# Patient Record
Sex: Female | Born: 1945 | Race: White | Hispanic: No | Smoking: Former smoker
Health system: Southern US, Community
[De-identification: ages and names within clinical notes are randomized; demographics above are authoritative.]

## PROBLEM LIST (undated history)

## (undated) DIAGNOSIS — I10 Essential (primary) hypertension: Secondary | ICD-10-CM

## (undated) DIAGNOSIS — F419 Anxiety disorder, unspecified: Secondary | ICD-10-CM

## (undated) DIAGNOSIS — K219 Gastro-esophageal reflux disease without esophagitis: Secondary | ICD-10-CM

## (undated) DIAGNOSIS — Z9981 Dependence on supplemental oxygen: Secondary | ICD-10-CM

## (undated) DIAGNOSIS — J449 Chronic obstructive pulmonary disease, unspecified: Secondary | ICD-10-CM

## (undated) DIAGNOSIS — C801 Malignant (primary) neoplasm, unspecified: Secondary | ICD-10-CM

## (undated) DIAGNOSIS — Z87442 Personal history of urinary calculi: Secondary | ICD-10-CM

## (undated) DIAGNOSIS — J45909 Unspecified asthma, uncomplicated: Secondary | ICD-10-CM

## (undated) HISTORY — PX: ABDOMINAL HYSTERECTOMY: SHX81

## (undated) HISTORY — PX: APPENDECTOMY: SHX54

## (undated) HISTORY — PX: EYE SURGERY: SHX253

## (undated) HISTORY — PX: OTHER SURGICAL HISTORY: SHX169

## (undated) HISTORY — PX: TONSILLECTOMY: SUR1361

---

## 2008-03-24 ENCOUNTER — Encounter: Admission: RE | Admit: 2008-03-24 | Discharge: 2008-03-24 | Payer: Self-pay | Admitting: Geriatric Medicine

## 2008-04-14 ENCOUNTER — Other Ambulatory Visit: Admission: RE | Admit: 2008-04-14 | Discharge: 2008-04-14 | Payer: Self-pay | Admitting: Internal Medicine

## 2008-10-03 ENCOUNTER — Encounter: Admission: RE | Admit: 2008-10-03 | Discharge: 2008-10-03 | Payer: Self-pay | Admitting: Internal Medicine

## 2008-10-14 ENCOUNTER — Encounter: Admission: RE | Admit: 2008-10-14 | Discharge: 2008-10-14 | Payer: Self-pay | Admitting: Internal Medicine

## 2009-11-08 ENCOUNTER — Encounter: Admission: RE | Admit: 2009-11-08 | Discharge: 2009-11-08 | Payer: Self-pay | Admitting: Internal Medicine

## 2010-12-31 ENCOUNTER — Other Ambulatory Visit: Payer: Self-pay | Admitting: Internal Medicine

## 2010-12-31 DIAGNOSIS — D4100 Neoplasm of uncertain behavior of unspecified kidney: Secondary | ICD-10-CM

## 2011-01-02 ENCOUNTER — Other Ambulatory Visit: Payer: Self-pay

## 2011-01-03 ENCOUNTER — Ambulatory Visit
Admission: RE | Admit: 2011-01-03 | Discharge: 2011-01-03 | Disposition: A | Discharge status: Home / Self Care | Payer: BC Managed Care – PPO | Source: Ambulatory Visit | Attending: Internal Medicine | Admitting: Internal Medicine

## 2011-01-03 DIAGNOSIS — D4100 Neoplasm of uncertain behavior of unspecified kidney: Secondary | ICD-10-CM

## 2012-02-18 ENCOUNTER — Other Ambulatory Visit: Payer: Self-pay | Admitting: Internal Medicine

## 2012-02-18 DIAGNOSIS — D4100 Neoplasm of uncertain behavior of unspecified kidney: Secondary | ICD-10-CM

## 2012-02-26 ENCOUNTER — Ambulatory Visit
Admission: RE | Admit: 2012-02-26 | Discharge: 2012-02-26 | Disposition: A | Discharge status: Home / Self Care | Payer: BC Managed Care – PPO | Source: Ambulatory Visit | Attending: Internal Medicine | Admitting: Internal Medicine

## 2012-02-26 DIAGNOSIS — D4100 Neoplasm of uncertain behavior of unspecified kidney: Secondary | ICD-10-CM

## 2013-05-03 DIAGNOSIS — J441 Chronic obstructive pulmonary disease with (acute) exacerbation: Secondary | ICD-10-CM | POA: Diagnosis not present

## 2013-06-02 ENCOUNTER — Other Ambulatory Visit: Payer: Self-pay | Admitting: Internal Medicine

## 2013-06-02 DIAGNOSIS — J309 Allergic rhinitis, unspecified: Secondary | ICD-10-CM | POA: Diagnosis not present

## 2013-06-02 DIAGNOSIS — R1013 Epigastric pain: Secondary | ICD-10-CM | POA: Diagnosis not present

## 2013-06-02 DIAGNOSIS — I10 Essential (primary) hypertension: Secondary | ICD-10-CM | POA: Diagnosis not present

## 2013-06-02 DIAGNOSIS — R319 Hematuria, unspecified: Secondary | ICD-10-CM

## 2013-06-02 DIAGNOSIS — G9332 Myalgic encephalomyelitis/chronic fatigue syndrome: Secondary | ICD-10-CM | POA: Diagnosis not present

## 2013-06-02 DIAGNOSIS — K3189 Other diseases of stomach and duodenum: Secondary | ICD-10-CM | POA: Diagnosis not present

## 2013-06-02 DIAGNOSIS — J441 Chronic obstructive pulmonary disease with (acute) exacerbation: Secondary | ICD-10-CM | POA: Diagnosis not present

## 2013-06-02 DIAGNOSIS — N281 Cyst of kidney, acquired: Secondary | ICD-10-CM

## 2013-06-02 DIAGNOSIS — K130 Diseases of lips: Secondary | ICD-10-CM | POA: Diagnosis not present

## 2013-06-02 DIAGNOSIS — R5382 Chronic fatigue, unspecified: Secondary | ICD-10-CM | POA: Diagnosis not present

## 2013-06-02 DIAGNOSIS — Z0000 Encounter for general adult medical examination without abnormal findings: Secondary | ICD-10-CM | POA: Diagnosis not present

## 2013-06-03 ENCOUNTER — Other Ambulatory Visit: Payer: BC Managed Care – PPO

## 2013-06-26 DIAGNOSIS — IMO0002 Reserved for concepts with insufficient information to code with codable children: Secondary | ICD-10-CM | POA: Diagnosis not present

## 2013-06-28 DIAGNOSIS — D2370 Other benign neoplasm of skin of unspecified lower limb, including hip: Secondary | ICD-10-CM | POA: Diagnosis not present

## 2013-06-28 DIAGNOSIS — M79609 Pain in unspecified limb: Secondary | ICD-10-CM | POA: Diagnosis not present

## 2013-06-30 DIAGNOSIS — S83509A Sprain of unspecified cruciate ligament of unspecified knee, initial encounter: Secondary | ICD-10-CM | POA: Diagnosis not present

## 2013-07-18 DIAGNOSIS — S83509A Sprain of unspecified cruciate ligament of unspecified knee, initial encounter: Secondary | ICD-10-CM | POA: Diagnosis not present

## 2013-07-18 DIAGNOSIS — R262 Difficulty in walking, not elsewhere classified: Secondary | ICD-10-CM | POA: Diagnosis not present

## 2013-07-18 DIAGNOSIS — M25569 Pain in unspecified knee: Secondary | ICD-10-CM | POA: Diagnosis not present

## 2013-07-22 DIAGNOSIS — M25569 Pain in unspecified knee: Secondary | ICD-10-CM | POA: Diagnosis not present

## 2013-07-22 DIAGNOSIS — S83509A Sprain of unspecified cruciate ligament of unspecified knee, initial encounter: Secondary | ICD-10-CM | POA: Diagnosis not present

## 2013-07-22 DIAGNOSIS — R262 Difficulty in walking, not elsewhere classified: Secondary | ICD-10-CM | POA: Diagnosis not present

## 2013-07-24 DIAGNOSIS — M25569 Pain in unspecified knee: Secondary | ICD-10-CM | POA: Diagnosis not present

## 2013-07-24 DIAGNOSIS — S83509A Sprain of unspecified cruciate ligament of unspecified knee, initial encounter: Secondary | ICD-10-CM | POA: Diagnosis not present

## 2013-07-24 DIAGNOSIS — R262 Difficulty in walking, not elsewhere classified: Secondary | ICD-10-CM | POA: Diagnosis not present

## 2013-07-26 DIAGNOSIS — M25569 Pain in unspecified knee: Secondary | ICD-10-CM | POA: Diagnosis not present

## 2013-07-26 DIAGNOSIS — S83509A Sprain of unspecified cruciate ligament of unspecified knee, initial encounter: Secondary | ICD-10-CM | POA: Diagnosis not present

## 2013-07-26 DIAGNOSIS — R262 Difficulty in walking, not elsewhere classified: Secondary | ICD-10-CM | POA: Diagnosis not present

## 2013-07-29 DIAGNOSIS — S83509A Sprain of unspecified cruciate ligament of unspecified knee, initial encounter: Secondary | ICD-10-CM | POA: Diagnosis not present

## 2013-07-29 DIAGNOSIS — R262 Difficulty in walking, not elsewhere classified: Secondary | ICD-10-CM | POA: Diagnosis not present

## 2013-07-29 DIAGNOSIS — M25569 Pain in unspecified knee: Secondary | ICD-10-CM | POA: Diagnosis not present

## 2013-08-11 DIAGNOSIS — S83509A Sprain of unspecified cruciate ligament of unspecified knee, initial encounter: Secondary | ICD-10-CM | POA: Diagnosis not present

## 2013-08-24 DIAGNOSIS — S83509A Sprain of unspecified cruciate ligament of unspecified knee, initial encounter: Secondary | ICD-10-CM | POA: Diagnosis not present

## 2013-08-24 DIAGNOSIS — M25569 Pain in unspecified knee: Secondary | ICD-10-CM | POA: Diagnosis not present

## 2013-08-24 DIAGNOSIS — R262 Difficulty in walking, not elsewhere classified: Secondary | ICD-10-CM | POA: Diagnosis not present

## 2013-09-22 DIAGNOSIS — IMO0002 Reserved for concepts with insufficient information to code with codable children: Secondary | ICD-10-CM | POA: Diagnosis not present

## 2013-09-22 DIAGNOSIS — S83509A Sprain of unspecified cruciate ligament of unspecified knee, initial encounter: Secondary | ICD-10-CM | POA: Diagnosis not present

## 2013-09-23 DIAGNOSIS — R262 Difficulty in walking, not elsewhere classified: Secondary | ICD-10-CM | POA: Diagnosis not present

## 2013-09-23 DIAGNOSIS — M25569 Pain in unspecified knee: Secondary | ICD-10-CM | POA: Diagnosis not present

## 2013-09-23 DIAGNOSIS — S83509A Sprain of unspecified cruciate ligament of unspecified knee, initial encounter: Secondary | ICD-10-CM | POA: Diagnosis not present

## 2013-09-26 DIAGNOSIS — R262 Difficulty in walking, not elsewhere classified: Secondary | ICD-10-CM | POA: Diagnosis not present

## 2013-09-26 DIAGNOSIS — M25569 Pain in unspecified knee: Secondary | ICD-10-CM | POA: Diagnosis not present

## 2013-09-26 DIAGNOSIS — S83509A Sprain of unspecified cruciate ligament of unspecified knee, initial encounter: Secondary | ICD-10-CM | POA: Diagnosis not present

## 2013-09-29 DIAGNOSIS — J449 Chronic obstructive pulmonary disease, unspecified: Secondary | ICD-10-CM | POA: Diagnosis not present

## 2013-09-29 DIAGNOSIS — K130 Diseases of lips: Secondary | ICD-10-CM | POA: Diagnosis not present

## 2013-09-29 DIAGNOSIS — N951 Menopausal and female climacteric states: Secondary | ICD-10-CM | POA: Diagnosis not present

## 2013-09-29 DIAGNOSIS — R319 Hematuria, unspecified: Secondary | ICD-10-CM | POA: Diagnosis not present

## 2013-09-29 DIAGNOSIS — I10 Essential (primary) hypertension: Secondary | ICD-10-CM | POA: Diagnosis not present

## 2013-09-29 DIAGNOSIS — K3189 Other diseases of stomach and duodenum: Secondary | ICD-10-CM | POA: Diagnosis not present

## 2013-09-29 DIAGNOSIS — J309 Allergic rhinitis, unspecified: Secondary | ICD-10-CM | POA: Diagnosis not present

## 2013-10-05 DIAGNOSIS — R262 Difficulty in walking, not elsewhere classified: Secondary | ICD-10-CM | POA: Diagnosis not present

## 2013-10-05 DIAGNOSIS — S83509A Sprain of unspecified cruciate ligament of unspecified knee, initial encounter: Secondary | ICD-10-CM | POA: Diagnosis not present

## 2013-10-05 DIAGNOSIS — M25569 Pain in unspecified knee: Secondary | ICD-10-CM | POA: Diagnosis not present

## 2013-10-10 DIAGNOSIS — J069 Acute upper respiratory infection, unspecified: Secondary | ICD-10-CM | POA: Diagnosis not present

## 2013-10-10 DIAGNOSIS — I10 Essential (primary) hypertension: Secondary | ICD-10-CM | POA: Diagnosis not present

## 2014-01-27 DIAGNOSIS — I10 Essential (primary) hypertension: Secondary | ICD-10-CM | POA: Diagnosis not present

## 2014-01-27 DIAGNOSIS — J309 Allergic rhinitis, unspecified: Secondary | ICD-10-CM | POA: Diagnosis not present

## 2014-01-27 DIAGNOSIS — Z23 Encounter for immunization: Secondary | ICD-10-CM | POA: Diagnosis not present

## 2014-01-27 DIAGNOSIS — N951 Menopausal and female climacteric states: Secondary | ICD-10-CM | POA: Diagnosis not present

## 2014-01-27 DIAGNOSIS — J449 Chronic obstructive pulmonary disease, unspecified: Secondary | ICD-10-CM | POA: Diagnosis not present

## 2014-01-27 DIAGNOSIS — K3189 Other diseases of stomach and duodenum: Secondary | ICD-10-CM | POA: Diagnosis not present

## 2014-01-27 DIAGNOSIS — R1013 Epigastric pain: Secondary | ICD-10-CM | POA: Diagnosis not present

## 2014-06-05 ENCOUNTER — Other Ambulatory Visit (HOSPITAL_COMMUNITY): Payer: Self-pay | Admitting: Diagnostic Radiology

## 2014-06-05 DIAGNOSIS — J309 Allergic rhinitis, unspecified: Secondary | ICD-10-CM | POA: Diagnosis not present

## 2014-06-05 DIAGNOSIS — K5730 Diverticulosis of large intestine without perforation or abscess without bleeding: Secondary | ICD-10-CM | POA: Diagnosis not present

## 2014-06-05 DIAGNOSIS — E782 Mixed hyperlipidemia: Secondary | ICD-10-CM | POA: Diagnosis not present

## 2014-06-05 DIAGNOSIS — J449 Chronic obstructive pulmonary disease, unspecified: Secondary | ICD-10-CM | POA: Diagnosis not present

## 2014-06-05 DIAGNOSIS — I10 Essential (primary) hypertension: Secondary | ICD-10-CM | POA: Diagnosis not present

## 2014-06-05 DIAGNOSIS — Z0001 Encounter for general adult medical examination with abnormal findings: Secondary | ICD-10-CM | POA: Diagnosis not present

## 2014-06-05 DIAGNOSIS — D4100 Neoplasm of uncertain behavior of unspecified kidney: Secondary | ICD-10-CM | POA: Diagnosis not present

## 2014-06-05 DIAGNOSIS — F4320 Adjustment disorder, unspecified: Secondary | ICD-10-CM | POA: Diagnosis not present

## 2014-06-05 DIAGNOSIS — N951 Menopausal and female climacteric states: Secondary | ICD-10-CM | POA: Diagnosis not present

## 2014-06-08 ENCOUNTER — Other Ambulatory Visit: Payer: Self-pay

## 2014-06-12 ENCOUNTER — Ambulatory Visit
Admission: RE | Admit: 2014-06-12 | Discharge: 2014-06-12 | Disposition: A | Discharge status: Home / Self Care | Payer: Medicare Other | Source: Ambulatory Visit | Attending: Internal Medicine | Admitting: Internal Medicine

## 2014-06-12 DIAGNOSIS — N281 Cyst of kidney, acquired: Secondary | ICD-10-CM

## 2014-06-12 DIAGNOSIS — R319 Hematuria, unspecified: Secondary | ICD-10-CM | POA: Diagnosis not present

## 2014-07-18 DIAGNOSIS — H25013 Cortical age-related cataract, bilateral: Secondary | ICD-10-CM | POA: Diagnosis not present

## 2014-07-18 DIAGNOSIS — H52203 Unspecified astigmatism, bilateral: Secondary | ICD-10-CM | POA: Diagnosis not present

## 2014-10-03 DIAGNOSIS — J441 Chronic obstructive pulmonary disease with (acute) exacerbation: Secondary | ICD-10-CM | POA: Diagnosis not present

## 2014-11-21 DIAGNOSIS — R350 Frequency of micturition: Secondary | ICD-10-CM | POA: Diagnosis not present

## 2014-11-21 DIAGNOSIS — N200 Calculus of kidney: Secondary | ICD-10-CM | POA: Diagnosis not present

## 2014-11-21 DIAGNOSIS — R319 Hematuria, unspecified: Secondary | ICD-10-CM | POA: Diagnosis not present

## 2014-12-18 DIAGNOSIS — F4320 Adjustment disorder, unspecified: Secondary | ICD-10-CM | POA: Diagnosis not present

## 2014-12-18 DIAGNOSIS — J441 Chronic obstructive pulmonary disease with (acute) exacerbation: Secondary | ICD-10-CM | POA: Diagnosis not present

## 2014-12-18 DIAGNOSIS — I10 Essential (primary) hypertension: Secondary | ICD-10-CM | POA: Diagnosis not present

## 2014-12-18 DIAGNOSIS — N951 Menopausal and female climacteric states: Secondary | ICD-10-CM | POA: Diagnosis not present

## 2014-12-18 DIAGNOSIS — D4100 Neoplasm of uncertain behavior of unspecified kidney: Secondary | ICD-10-CM | POA: Diagnosis not present

## 2014-12-18 DIAGNOSIS — F17200 Nicotine dependence, unspecified, uncomplicated: Secondary | ICD-10-CM | POA: Diagnosis not present

## 2014-12-30 DIAGNOSIS — Z1231 Encounter for screening mammogram for malignant neoplasm of breast: Secondary | ICD-10-CM | POA: Diagnosis not present

## 2015-01-22 DIAGNOSIS — F4320 Adjustment disorder, unspecified: Secondary | ICD-10-CM | POA: Diagnosis not present

## 2015-01-22 DIAGNOSIS — J441 Chronic obstructive pulmonary disease with (acute) exacerbation: Secondary | ICD-10-CM | POA: Diagnosis not present

## 2015-01-22 DIAGNOSIS — F17200 Nicotine dependence, unspecified, uncomplicated: Secondary | ICD-10-CM | POA: Diagnosis not present

## 2015-01-22 DIAGNOSIS — I10 Essential (primary) hypertension: Secondary | ICD-10-CM | POA: Diagnosis not present

## 2015-01-22 DIAGNOSIS — D4100 Neoplasm of uncertain behavior of unspecified kidney: Secondary | ICD-10-CM | POA: Diagnosis not present

## 2015-01-22 DIAGNOSIS — N951 Menopausal and female climacteric states: Secondary | ICD-10-CM | POA: Diagnosis not present

## 2015-03-07 DIAGNOSIS — R112 Nausea with vomiting, unspecified: Secondary | ICD-10-CM | POA: Diagnosis not present

## 2015-03-07 DIAGNOSIS — R109 Unspecified abdominal pain: Secondary | ICD-10-CM | POA: Diagnosis not present

## 2015-03-07 DIAGNOSIS — N23 Unspecified renal colic: Secondary | ICD-10-CM | POA: Diagnosis not present

## 2015-04-02 DIAGNOSIS — F17200 Nicotine dependence, unspecified, uncomplicated: Secondary | ICD-10-CM | POA: Diagnosis not present

## 2015-04-02 DIAGNOSIS — Z23 Encounter for immunization: Secondary | ICD-10-CM | POA: Diagnosis not present

## 2015-04-02 DIAGNOSIS — N951 Menopausal and female climacteric states: Secondary | ICD-10-CM | POA: Diagnosis not present

## 2015-04-02 DIAGNOSIS — J441 Chronic obstructive pulmonary disease with (acute) exacerbation: Secondary | ICD-10-CM | POA: Diagnosis not present

## 2015-04-02 DIAGNOSIS — I10 Essential (primary) hypertension: Secondary | ICD-10-CM | POA: Diagnosis not present

## 2015-04-02 DIAGNOSIS — F4320 Adjustment disorder, unspecified: Secondary | ICD-10-CM | POA: Diagnosis not present

## 2015-04-16 DIAGNOSIS — N951 Menopausal and female climacteric states: Secondary | ICD-10-CM | POA: Diagnosis not present

## 2015-04-16 DIAGNOSIS — I10 Essential (primary) hypertension: Secondary | ICD-10-CM | POA: Diagnosis not present

## 2015-04-16 DIAGNOSIS — F4320 Adjustment disorder, unspecified: Secondary | ICD-10-CM | POA: Diagnosis not present

## 2015-04-16 DIAGNOSIS — F17200 Nicotine dependence, unspecified, uncomplicated: Secondary | ICD-10-CM | POA: Diagnosis not present

## 2015-04-16 DIAGNOSIS — J441 Chronic obstructive pulmonary disease with (acute) exacerbation: Secondary | ICD-10-CM | POA: Diagnosis not present

## 2015-04-16 DIAGNOSIS — Z0000 Encounter for general adult medical examination without abnormal findings: Secondary | ICD-10-CM | POA: Diagnosis not present

## 2015-06-18 DIAGNOSIS — E782 Mixed hyperlipidemia: Secondary | ICD-10-CM | POA: Diagnosis not present

## 2015-06-18 DIAGNOSIS — Z79899 Other long term (current) drug therapy: Secondary | ICD-10-CM | POA: Diagnosis not present

## 2015-06-18 DIAGNOSIS — F17200 Nicotine dependence, unspecified, uncomplicated: Secondary | ICD-10-CM | POA: Diagnosis not present

## 2015-06-18 DIAGNOSIS — N951 Menopausal and female climacteric states: Secondary | ICD-10-CM | POA: Diagnosis not present

## 2015-06-18 DIAGNOSIS — Z1389 Encounter for screening for other disorder: Secondary | ICD-10-CM | POA: Diagnosis not present

## 2015-06-18 DIAGNOSIS — I10 Essential (primary) hypertension: Secondary | ICD-10-CM | POA: Diagnosis not present

## 2015-06-18 DIAGNOSIS — Z0001 Encounter for general adult medical examination with abnormal findings: Secondary | ICD-10-CM | POA: Diagnosis not present

## 2015-06-18 DIAGNOSIS — F4320 Adjustment disorder, unspecified: Secondary | ICD-10-CM | POA: Diagnosis not present

## 2015-06-18 DIAGNOSIS — N907 Vulvar cyst: Secondary | ICD-10-CM | POA: Diagnosis not present

## 2015-06-18 DIAGNOSIS — J441 Chronic obstructive pulmonary disease with (acute) exacerbation: Secondary | ICD-10-CM | POA: Diagnosis not present

## 2015-07-03 DIAGNOSIS — J209 Acute bronchitis, unspecified: Secondary | ICD-10-CM | POA: Diagnosis not present

## 2015-07-06 DIAGNOSIS — J441 Chronic obstructive pulmonary disease with (acute) exacerbation: Secondary | ICD-10-CM | POA: Diagnosis not present

## 2015-07-23 DIAGNOSIS — J441 Chronic obstructive pulmonary disease with (acute) exacerbation: Secondary | ICD-10-CM | POA: Diagnosis not present

## 2015-08-13 DIAGNOSIS — H2513 Age-related nuclear cataract, bilateral: Secondary | ICD-10-CM | POA: Diagnosis not present

## 2015-08-13 DIAGNOSIS — H52203 Unspecified astigmatism, bilateral: Secondary | ICD-10-CM | POA: Diagnosis not present

## 2015-08-19 DIAGNOSIS — M5489 Other dorsalgia: Secondary | ICD-10-CM | POA: Diagnosis not present

## 2015-09-06 DIAGNOSIS — I10 Essential (primary) hypertension: Secondary | ICD-10-CM | POA: Diagnosis not present

## 2015-09-06 DIAGNOSIS — F17200 Nicotine dependence, unspecified, uncomplicated: Secondary | ICD-10-CM | POA: Diagnosis not present

## 2015-09-06 DIAGNOSIS — M5431 Sciatica, right side: Secondary | ICD-10-CM | POA: Diagnosis not present

## 2015-09-06 DIAGNOSIS — R21 Rash and other nonspecific skin eruption: Secondary | ICD-10-CM | POA: Diagnosis not present

## 2015-10-15 DIAGNOSIS — N907 Vulvar cyst: Secondary | ICD-10-CM | POA: Diagnosis not present

## 2015-11-12 DIAGNOSIS — F17200 Nicotine dependence, unspecified, uncomplicated: Secondary | ICD-10-CM | POA: Diagnosis not present

## 2015-11-12 DIAGNOSIS — N907 Vulvar cyst: Secondary | ICD-10-CM | POA: Diagnosis not present

## 2015-11-12 DIAGNOSIS — J449 Chronic obstructive pulmonary disease, unspecified: Secondary | ICD-10-CM | POA: Diagnosis not present

## 2015-11-12 DIAGNOSIS — J9611 Chronic respiratory failure with hypoxia: Secondary | ICD-10-CM | POA: Diagnosis not present

## 2015-11-12 DIAGNOSIS — I10 Essential (primary) hypertension: Secondary | ICD-10-CM | POA: Diagnosis not present

## 2015-11-22 DIAGNOSIS — H25812 Combined forms of age-related cataract, left eye: Secondary | ICD-10-CM | POA: Diagnosis not present

## 2015-11-22 DIAGNOSIS — H2512 Age-related nuclear cataract, left eye: Secondary | ICD-10-CM | POA: Diagnosis not present

## 2015-12-06 DIAGNOSIS — H25811 Combined forms of age-related cataract, right eye: Secondary | ICD-10-CM | POA: Diagnosis not present

## 2015-12-06 DIAGNOSIS — H2511 Age-related nuclear cataract, right eye: Secondary | ICD-10-CM | POA: Diagnosis not present

## 2016-01-14 DIAGNOSIS — N951 Menopausal and female climacteric states: Secondary | ICD-10-CM | POA: Diagnosis not present

## 2016-02-18 DIAGNOSIS — J449 Chronic obstructive pulmonary disease, unspecified: Secondary | ICD-10-CM | POA: Diagnosis not present

## 2016-02-18 DIAGNOSIS — N951 Menopausal and female climacteric states: Secondary | ICD-10-CM | POA: Diagnosis not present

## 2016-02-18 DIAGNOSIS — F17200 Nicotine dependence, unspecified, uncomplicated: Secondary | ICD-10-CM | POA: Diagnosis not present

## 2016-02-18 DIAGNOSIS — N907 Vulvar cyst: Secondary | ICD-10-CM | POA: Diagnosis not present

## 2016-02-18 DIAGNOSIS — J9611 Chronic respiratory failure with hypoxia: Secondary | ICD-10-CM | POA: Diagnosis not present

## 2016-02-18 DIAGNOSIS — Z23 Encounter for immunization: Secondary | ICD-10-CM | POA: Diagnosis not present

## 2016-02-18 DIAGNOSIS — I10 Essential (primary) hypertension: Secondary | ICD-10-CM | POA: Diagnosis not present

## 2016-06-06 DIAGNOSIS — F1824 Inhalant dependence with inhalant-induced mood disorder: Secondary | ICD-10-CM | POA: Diagnosis not present

## 2016-06-09 DIAGNOSIS — F1824 Inhalant dependence with inhalant-induced mood disorder: Secondary | ICD-10-CM | POA: Diagnosis not present

## 2016-06-16 DIAGNOSIS — F1824 Inhalant dependence with inhalant-induced mood disorder: Secondary | ICD-10-CM | POA: Diagnosis not present

## 2016-06-23 DIAGNOSIS — F1824 Inhalant dependence with inhalant-induced mood disorder: Secondary | ICD-10-CM | POA: Diagnosis not present

## 2016-06-30 DIAGNOSIS — F1824 Inhalant dependence with inhalant-induced mood disorder: Secondary | ICD-10-CM | POA: Diagnosis not present

## 2016-07-07 DIAGNOSIS — F1824 Inhalant dependence with inhalant-induced mood disorder: Secondary | ICD-10-CM | POA: Diagnosis not present

## 2016-07-14 DIAGNOSIS — F1824 Inhalant dependence with inhalant-induced mood disorder: Secondary | ICD-10-CM | POA: Diagnosis not present

## 2016-07-21 DIAGNOSIS — F1824 Inhalant dependence with inhalant-induced mood disorder: Secondary | ICD-10-CM | POA: Diagnosis not present

## 2016-07-28 DIAGNOSIS — F1824 Inhalant dependence with inhalant-induced mood disorder: Secondary | ICD-10-CM | POA: Diagnosis not present

## 2016-07-30 DIAGNOSIS — J449 Chronic obstructive pulmonary disease, unspecified: Secondary | ICD-10-CM | POA: Diagnosis not present

## 2016-08-11 DIAGNOSIS — F1824 Inhalant dependence with inhalant-induced mood disorder: Secondary | ICD-10-CM | POA: Diagnosis not present

## 2016-08-21 DIAGNOSIS — F1824 Inhalant dependence with inhalant-induced mood disorder: Secondary | ICD-10-CM | POA: Diagnosis not present

## 2016-08-28 DIAGNOSIS — F1824 Inhalant dependence with inhalant-induced mood disorder: Secondary | ICD-10-CM | POA: Diagnosis not present

## 2016-08-30 DIAGNOSIS — J449 Chronic obstructive pulmonary disease, unspecified: Secondary | ICD-10-CM | POA: Diagnosis not present

## 2016-09-01 DIAGNOSIS — F17200 Nicotine dependence, unspecified, uncomplicated: Secondary | ICD-10-CM | POA: Diagnosis not present

## 2016-09-01 DIAGNOSIS — E559 Vitamin D deficiency, unspecified: Secondary | ICD-10-CM | POA: Diagnosis not present

## 2016-09-01 DIAGNOSIS — Z139 Encounter for screening, unspecified: Secondary | ICD-10-CM | POA: Diagnosis not present

## 2016-09-01 DIAGNOSIS — J449 Chronic obstructive pulmonary disease, unspecified: Secondary | ICD-10-CM | POA: Diagnosis not present

## 2016-09-01 DIAGNOSIS — I10 Essential (primary) hypertension: Secondary | ICD-10-CM | POA: Diagnosis not present

## 2016-09-01 DIAGNOSIS — Z0001 Encounter for general adult medical examination with abnormal findings: Secondary | ICD-10-CM | POA: Diagnosis not present

## 2016-09-01 DIAGNOSIS — N951 Menopausal and female climacteric states: Secondary | ICD-10-CM | POA: Diagnosis not present

## 2016-09-01 DIAGNOSIS — J9611 Chronic respiratory failure with hypoxia: Secondary | ICD-10-CM | POA: Diagnosis not present

## 2016-09-01 DIAGNOSIS — J309 Allergic rhinitis, unspecified: Secondary | ICD-10-CM | POA: Diagnosis not present

## 2016-09-01 DIAGNOSIS — Z1389 Encounter for screening for other disorder: Secondary | ICD-10-CM | POA: Diagnosis not present

## 2016-09-01 DIAGNOSIS — R5382 Chronic fatigue, unspecified: Secondary | ICD-10-CM | POA: Diagnosis not present

## 2016-09-04 DIAGNOSIS — F1824 Inhalant dependence with inhalant-induced mood disorder: Secondary | ICD-10-CM | POA: Diagnosis not present

## 2016-09-11 DIAGNOSIS — F1824 Inhalant dependence with inhalant-induced mood disorder: Secondary | ICD-10-CM | POA: Diagnosis not present

## 2016-09-13 DIAGNOSIS — Z1231 Encounter for screening mammogram for malignant neoplasm of breast: Secondary | ICD-10-CM | POA: Diagnosis not present

## 2016-09-15 DIAGNOSIS — F1824 Inhalant dependence with inhalant-induced mood disorder: Secondary | ICD-10-CM | POA: Diagnosis not present

## 2016-09-22 DIAGNOSIS — F1824 Inhalant dependence with inhalant-induced mood disorder: Secondary | ICD-10-CM | POA: Diagnosis not present

## 2016-09-29 DIAGNOSIS — F1824 Inhalant dependence with inhalant-induced mood disorder: Secondary | ICD-10-CM | POA: Diagnosis not present

## 2016-09-29 DIAGNOSIS — J449 Chronic obstructive pulmonary disease, unspecified: Secondary | ICD-10-CM | POA: Diagnosis not present

## 2016-10-06 DIAGNOSIS — F1824 Inhalant dependence with inhalant-induced mood disorder: Secondary | ICD-10-CM | POA: Diagnosis not present

## 2016-10-06 DIAGNOSIS — R310 Gross hematuria: Secondary | ICD-10-CM | POA: Diagnosis not present

## 2016-10-13 DIAGNOSIS — F1824 Inhalant dependence with inhalant-induced mood disorder: Secondary | ICD-10-CM | POA: Diagnosis not present

## 2016-10-13 DIAGNOSIS — N750 Cyst of Bartholin's gland: Secondary | ICD-10-CM | POA: Diagnosis not present

## 2016-10-13 DIAGNOSIS — Z01411 Encounter for gynecological examination (general) (routine) with abnormal findings: Secondary | ICD-10-CM | POA: Diagnosis not present

## 2016-10-13 DIAGNOSIS — N951 Menopausal and female climacteric states: Secondary | ICD-10-CM | POA: Diagnosis not present

## 2016-10-23 DIAGNOSIS — F1824 Inhalant dependence with inhalant-induced mood disorder: Secondary | ICD-10-CM | POA: Diagnosis not present

## 2016-10-28 DIAGNOSIS — R319 Hematuria, unspecified: Secondary | ICD-10-CM | POA: Diagnosis not present

## 2016-10-28 DIAGNOSIS — J329 Chronic sinusitis, unspecified: Secondary | ICD-10-CM | POA: Diagnosis not present

## 2016-10-28 DIAGNOSIS — Z87442 Personal history of urinary calculi: Secondary | ICD-10-CM | POA: Diagnosis not present

## 2016-10-30 DIAGNOSIS — F1824 Inhalant dependence with inhalant-induced mood disorder: Secondary | ICD-10-CM | POA: Diagnosis not present

## 2016-10-30 DIAGNOSIS — J449 Chronic obstructive pulmonary disease, unspecified: Secondary | ICD-10-CM | POA: Diagnosis not present

## 2016-11-03 DIAGNOSIS — F1824 Inhalant dependence with inhalant-induced mood disorder: Secondary | ICD-10-CM | POA: Diagnosis not present

## 2016-11-10 DIAGNOSIS — F1824 Inhalant dependence with inhalant-induced mood disorder: Secondary | ICD-10-CM | POA: Diagnosis not present

## 2016-11-17 DIAGNOSIS — F1824 Inhalant dependence with inhalant-induced mood disorder: Secondary | ICD-10-CM | POA: Diagnosis not present

## 2016-11-29 DIAGNOSIS — J449 Chronic obstructive pulmonary disease, unspecified: Secondary | ICD-10-CM | POA: Diagnosis not present

## 2016-12-30 DIAGNOSIS — J449 Chronic obstructive pulmonary disease, unspecified: Secondary | ICD-10-CM | POA: Diagnosis not present

## 2017-01-12 DIAGNOSIS — Z961 Presence of intraocular lens: Secondary | ICD-10-CM | POA: Diagnosis not present

## 2017-01-12 DIAGNOSIS — H52203 Unspecified astigmatism, bilateral: Secondary | ICD-10-CM | POA: Diagnosis not present

## 2017-01-12 DIAGNOSIS — H02831 Dermatochalasis of right upper eyelid: Secondary | ICD-10-CM | POA: Diagnosis not present

## 2017-01-12 DIAGNOSIS — H0012 Chalazion right lower eyelid: Secondary | ICD-10-CM | POA: Diagnosis not present

## 2017-01-30 DIAGNOSIS — J449 Chronic obstructive pulmonary disease, unspecified: Secondary | ICD-10-CM | POA: Diagnosis not present

## 2017-02-23 DIAGNOSIS — H02831 Dermatochalasis of right upper eyelid: Secondary | ICD-10-CM | POA: Diagnosis not present

## 2017-02-23 DIAGNOSIS — H02834 Dermatochalasis of left upper eyelid: Secondary | ICD-10-CM | POA: Diagnosis not present

## 2017-03-01 DIAGNOSIS — J449 Chronic obstructive pulmonary disease, unspecified: Secondary | ICD-10-CM | POA: Diagnosis not present

## 2017-03-02 DIAGNOSIS — J9611 Chronic respiratory failure with hypoxia: Secondary | ICD-10-CM | POA: Diagnosis not present

## 2017-03-02 DIAGNOSIS — T148XXA Other injury of unspecified body region, initial encounter: Secondary | ICD-10-CM | POA: Diagnosis not present

## 2017-03-02 DIAGNOSIS — E669 Obesity, unspecified: Secondary | ICD-10-CM | POA: Diagnosis not present

## 2017-03-02 DIAGNOSIS — I10 Essential (primary) hypertension: Secondary | ICD-10-CM | POA: Diagnosis not present

## 2017-03-02 DIAGNOSIS — Z87442 Personal history of urinary calculi: Secondary | ICD-10-CM | POA: Diagnosis not present

## 2017-03-02 DIAGNOSIS — J449 Chronic obstructive pulmonary disease, unspecified: Secondary | ICD-10-CM | POA: Diagnosis not present

## 2017-03-02 DIAGNOSIS — R000 Tachycardia, unspecified: Secondary | ICD-10-CM | POA: Diagnosis not present

## 2017-03-02 DIAGNOSIS — J309 Allergic rhinitis, unspecified: Secondary | ICD-10-CM | POA: Diagnosis not present

## 2017-03-02 DIAGNOSIS — R319 Hematuria, unspecified: Secondary | ICD-10-CM | POA: Diagnosis not present

## 2017-03-02 DIAGNOSIS — Z6828 Body mass index (BMI) 28.0-28.9, adult: Secondary | ICD-10-CM | POA: Diagnosis not present

## 2017-03-02 DIAGNOSIS — E559 Vitamin D deficiency, unspecified: Secondary | ICD-10-CM | POA: Diagnosis not present

## 2017-03-02 DIAGNOSIS — Z23 Encounter for immunization: Secondary | ICD-10-CM | POA: Diagnosis not present

## 2017-03-09 DIAGNOSIS — J9611 Chronic respiratory failure with hypoxia: Secondary | ICD-10-CM | POA: Diagnosis not present

## 2017-03-09 DIAGNOSIS — I10 Essential (primary) hypertension: Secondary | ICD-10-CM | POA: Diagnosis not present

## 2017-03-09 DIAGNOSIS — N390 Urinary tract infection, site not specified: Secondary | ICD-10-CM | POA: Diagnosis not present

## 2017-03-09 DIAGNOSIS — R000 Tachycardia, unspecified: Secondary | ICD-10-CM | POA: Diagnosis not present

## 2017-03-09 DIAGNOSIS — J449 Chronic obstructive pulmonary disease, unspecified: Secondary | ICD-10-CM | POA: Diagnosis not present

## 2017-03-18 ENCOUNTER — Ambulatory Visit (INDEPENDENT_AMBULATORY_CARE_PROVIDER_SITE_OTHER): Payer: PPO | Admitting: Internal Medicine

## 2017-03-18 ENCOUNTER — Encounter: Payer: Self-pay | Admitting: Internal Medicine

## 2017-03-18 ENCOUNTER — Other Ambulatory Visit (INDEPENDENT_AMBULATORY_CARE_PROVIDER_SITE_OTHER): Payer: PPO

## 2017-03-18 VITALS — BP 118/72 | HR 88 | Ht 65.0 in | Wt 173.0 lb

## 2017-03-18 DIAGNOSIS — R0609 Other forms of dyspnea: Secondary | ICD-10-CM

## 2017-03-18 DIAGNOSIS — Z87891 Personal history of nicotine dependence: Secondary | ICD-10-CM

## 2017-03-18 DIAGNOSIS — R0902 Hypoxemia: Secondary | ICD-10-CM | POA: Diagnosis not present

## 2017-03-18 DIAGNOSIS — R0600 Dyspnea, unspecified: Secondary | ICD-10-CM | POA: Insufficient documentation

## 2017-03-18 DIAGNOSIS — I7300 Raynaud's syndrome without gangrene: Secondary | ICD-10-CM

## 2017-03-18 LAB — SEDIMENTATION RATE: SED RATE: 8 mm/h (ref 0–30)

## 2017-03-18 NOTE — Progress Notes (Signed)
Subjective:     Patient ID: Brandy Morales, female   DOB: Apr 04, 1946, 71 y.o.   MRN: [id number]  PCP [name], MD   HPI   IOV 03/18/2017  Chief Complaint  Patient presents with  . Pulm Consult    Pt referred by Dr. Inda Merlin MD for respiratory failure. Pt has SOB and wheezing with exertion. Pt on 2 liters on O2 [facility], been on O2 for two years. Pt fingers stays purple color and warm and feet stay cold and turn purple.     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 Dr. Inda Merlin is a primary care physician. She tells me that for the last 2 years she's been on chronic oxygen therapy. She uses the oxygen only on demand in the daytime based on subjective symptoms of dyspnea on exertion relieved by rest. Oxygen definitely helps her. She occasionally monitors her pulse ox and it would dip to 86% with exertion. She does not use the oxygen at night because she does not feel dyspneic atight. Is only occasional cough or wheezing. She has a previous 110 pack smoking history. She is not sure of the diagnosis COPD of pulmonary fibrosis but she reckons that his COPD based on the smoking history and previous history of wheezing and based on some other physician comments. Overall she's stable but at this point she wanted to see a pulmonologist there for she's been referred. Previously she was not interested. She does not recollect any pulmonary function tests a CT scan of the chest. She recollects that office chest x-ray to be abnormal. She is not on any inhaler therapy and this is because the oxygen therapy helped her.  In addition she does give a history of RaynaudThere is no prior history of autoimmune disease. Denies any pulmonary fibrosis history  Walking desaturation test 185 feet 3 laps on room air with a full head probe : Resting heart the resting pulse ox was 96% with a heart rate of 81/m.she walk with a full head probe. By the second lap she  started wheezing significantly. Breath and became short of breath. And tok a break , and at the second lap and the pulse ox was 89% with a heart rate of 101. At the third lap pulse ox was 90% with a heart rate of 99. She took 2 breaks. The only mild desaturation.    has no past medical history on file.   reports that she quit smoking about 2 years ago. Her smoking use included Cigarettes. She has a 110.00 pack-year smoking history. She has never used smokeless tobacco.  History reviewed. No pertinent surgical history.  Allergies  Allergen Reactions  . Statins Cough    Immunization History  Administered Date(s) Administered  . Influenza Split 03/03/2016, 03/11/2017    History reviewed. No pertinent family history.   Current Outpatient Prescriptions:  .  acyclovir (ZOVIRAX) 400 MG tablet, Take 400 mg by mouth 5 (five) times daily., Disp: , Rfl:  .  Ascorbic Acid (VITAMIN C) 100 MG CHEW, Chew by mouth., Disp: , Rfl:  .  BIOGAIA PROBIOTIC (BIOGAIA PROBIOTIC) LIQD, Take by mouth daily at 8 pm., Disp: , Rfl:  .  citalopram (CELEXA) 20 MG tablet, Take 20 mg by mouth daily., Disp: , Rfl:  .  fexofenadine (ALLEGRA) 180 MG tablet, Take 180 mg by mouth daily., Disp: , Rfl:  .  fluticasone furoate-vilanterol (BREO ELLIPTA) 200-25 MCG/INH AEPB, Inhale 1 puff into the lungs  daily., Disp: , Rfl:  .  levalbuterol (XOPENEX) 1.25 MG/0.5ML nebulizer solution, Take 1.25 mg by nebulization every 4 (four) hours as needed for wheezing or shortness of breath., Disp: , Rfl:  .  losartan (COZAAR) 100 MG tablet, Take 100 mg by mouth daily., Disp: , Rfl:  .  Multiple Vitamins-Minerals (CENTRUM SILVER ADULT 50+ PO), Take by mouth daily at 12 noon., Disp: , Rfl:  .  omeprazole (PRILOSEC) 10 MG capsule, Take 10 mg by mouth daily., Disp: , Rfl:  .  OXYGEN, Inhale 2 L/mL into the lungs., Disp: , Rfl:    Review of Systems     Objective:   Physical Exam  Constitutional: She is oriented to person, place, and  time. She appears well-developed and well-nourished. No distress.  HENT:  Head: Normocephalic and atraumatic.  Right Ear: External ear normal.  Left Ear: External ear normal.  Mouth/Throat: Oropharynx is clear and moist. No oropharyngeal exudate.  Eyes: Pupils are equal, round, and reactive to light. Conjunctivae and EOM are normal. Right eye exhibits no discharge. Left eye exhibits no discharge. No scleral icterus.  Neck: Normal range of motion. Neck supple. No JVD present. No tracheal deviation present. No thyromegaly present.  Cardiovascular: Normal rate, regular rhythm, normal heart sounds and intact distal pulses.  Exam reveals no gallop and no friction rub.   No murmur heard. Pulmonary/Chest: Effort normal and breath sounds normal. No respiratory distress. She has no wheezes. She has no rales. She exhibits no tenderness.  Abdominal: Soft. Bowel sounds are normal. She exhibits no distension and no mass. There is no tenderness. There is no rebound and no guarding.  Musculoskeletal: Normal range of motion. She exhibits no edema or tenderness.  Lymphadenopathy:    She has no cervical adenopathy.  Neurological: She is alert and oriented to person, place, and time. She has normal reflexes. No cranial nerve deficit. She exhibits normal muscle tone. Coordination normal.  Skin: Skin is warm and dry. No rash noted. She is not diaphoretic. No erythema. No pallor.  Mild raynaud hands  Psychiatric: She has a normal mood and affect. Her behavior is normal. Judgment and thought content normal.  Mild anxious  Vitals reviewed.  Vitals:   03/18/17 1622  BP: 118/72  Pulse: 88  SpO2: 94%  Weight: 173 lb (78.5 kg)  Height: '5\' 5"'$  (1.651 m)    Estimated body mass index is 28.79 kg/m as calculated from the following:   Height as of this encounter: '5\' 5"'$  (1.651 m).   Weight as of this encounter: 173 lb (78.5 kg).     Assessment:       ICD-10-CM   1. Dyspnea on exertion R06.09   2. History of  smoking greater than 50 pack years Z87.891   3. Hypoxemia R09.02   4. 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 DO both Supine and Prone Images. Only  Dr Lorin Picket or Dr Salvatore Marvel Dr. Vinnie Langton to read.   - Pre-bd spiro and dlco only. No lung volume or bd response. No post-bd spiro - next few weeks  - Serum: ESR, ACE, ANA, DS-DNA, RF, anti-CCP, ssA, ssB, scl-70, ANCA screen, MPO, PR-3, Total CK,  RNP, Aldolase,  Followup  - return to see me or app in few to several weeks.    Dr. Brand Males, M.D., [facility].C.P Pulmonary and Critical Care Medicine Staff Physician [place] [facility] Pager: [phone], If no answer or between  15:00h - 7:00h: call [phone]  03/18/2017 5:00 PM

## 2017-03-18 NOTE — Progress Notes (Signed)
   Subjective:    Patient ID: Brandy Morales, female    DOB: [birth year], 71 y.o.   MRN: [id number]  HPI    Review of Systems  Constitutional: Negative for fever and unexpected weight change.  HENT: Positive for dental problem, sinus pain and sinus pressure. Negative for congestion, ear pain, nosebleeds, postnasal drip, rhinorrhea, sneezing, sore throat and trouble swallowing.   Eyes: Positive for redness. Negative for itching.  Respiratory: Positive for cough, shortness of breath and wheezing. Negative for chest tightness.   Cardiovascular: Negative for palpitations and leg swelling.  Gastrointestinal: Negative for nausea and vomiting.  Genitourinary: Negative for dysuria.  Musculoskeletal: Negative for joint swelling.  Skin: Positive for rash.  Allergic/Immunologic: Positive for environmental allergies. Negative for food allergies and immunocompromised state.  Neurological: Negative for headaches.  Hematological: Bruises/bleeds easily.  Psychiatric/Behavioral: Negative for dysphoric mood. The patient is nervous/anxious.        Objective:   Physical Exam        Assessment & Plan:

## 2017-03-18 NOTE — Patient Instructions (Addendum)
ICD-10-CM   1. Dyspnea on exertion R06.09   2. History of smoking greater than 50 pack years Z87.891   3. Hypoxemia R09.02   4. 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 DO both Supine and Prone Images. Only  Dr Lorin Picket or Dr Salvatore Marvel Dr. Vinnie Langton to read.   - Pre-bd spiro and dlco only. No lung volume or bd response. No post-bd spiro - next few weeks  - Serum: ESR, ACE, ANA, DS-DNA, RF, anti-CCP, ssA, ssB, scl-70, ANCA screen, MPO, PR-3, Total CK,  RNP, Aldolase,    Followup  - return to see me or app in few to several weeks.

## 2017-03-19 LAB — RNP ANTIBODIES: ENA RNP Ab: <0.2 AI (ref 0.0–0.9)

## 2017-03-22 LAB — CK TOTAL AND CKMB (NOT AT ARMC)
CK TOTAL: 66 U/L (ref 29–143)
CK, MB: 2.8 ng/mL (ref 0–5.0)
RELATIVE INDEX: 4.2 — AB (ref 0–4.0)

## 2017-03-22 LAB — RHEUMATOID FACTOR

## 2017-03-22 LAB — ALDOLASE

## 2017-03-22 LAB — MPO/PR-3 (ANCA) ANTIBODIES
Myeloperoxidase Abs: <1 AI
Serine Protease 3: <1 AI

## 2017-03-22 LAB — ANA: ANA: NEGATIVE

## 2017-03-22 LAB — ANCA SCREEN W REFLEX TITER: ANCA SCREEN: NEGATIVE

## 2017-03-22 LAB — SJOGRENS SYNDROME-A EXTRACTABLE NUCLEAR ANTIBODY: SSA (RO) (ENA) ANTIBODY, IGG: NEGATIVE AI

## 2017-03-22 LAB — CYCLIC CITRUL PEPTIDE ANTIBODY, IGG: Cyclic Citrullin Peptide Ab: <16 UNITS

## 2017-03-22 LAB — ANGIOTENSIN CONVERTING ENZYME: ANGIOTENSIN-CONVERTING ENZYME: 33 U/L (ref 9–67)

## 2017-03-22 LAB — SJOGRENS SYNDROME-B EXTRACTABLE NUCLEAR ANTIBODY: SSB (LA) (ENA) ANTIBODY, IGG: NEGATIVE AI

## 2017-03-23 ENCOUNTER — Other Ambulatory Visit: Payer: Self-pay

## 2017-03-23 DIAGNOSIS — R0609 Other forms of dyspnea: Secondary | ICD-10-CM

## 2017-03-23 DIAGNOSIS — R0902 Hypoxemia: Secondary | ICD-10-CM

## 2017-03-23 DIAGNOSIS — R0600 Dyspnea, unspecified: Secondary | ICD-10-CM

## 2017-03-23 DIAGNOSIS — Z87891 Personal history of nicotine dependence: Secondary | ICD-10-CM

## 2017-03-26 ENCOUNTER — Inpatient Hospital Stay: Admission: RE | Admit: 2017-03-26 | Payer: Self-pay | Source: Ambulatory Visit

## 2017-03-30 ENCOUNTER — Ambulatory Visit (INDEPENDENT_AMBULATORY_CARE_PROVIDER_SITE_OTHER)
Admission: RE | Admit: 2017-03-30 | Discharge: 2017-03-30 | Disposition: A | Discharge status: Home / Self Care | Payer: PPO | Source: Ambulatory Visit | Attending: Internal Medicine | Admitting: Internal Medicine

## 2017-03-30 ENCOUNTER — Other Ambulatory Visit: Payer: PPO

## 2017-03-30 DIAGNOSIS — R918 Other nonspecific abnormal finding of lung field: Secondary | ICD-10-CM | POA: Diagnosis not present

## 2017-03-30 DIAGNOSIS — R0609 Other forms of dyspnea: Secondary | ICD-10-CM | POA: Diagnosis not present

## 2017-03-30 DIAGNOSIS — R0902 Hypoxemia: Secondary | ICD-10-CM

## 2017-03-30 DIAGNOSIS — Z87891 Personal history of nicotine dependence: Secondary | ICD-10-CM | POA: Diagnosis not present

## 2017-03-30 DIAGNOSIS — R0600 Dyspnea, unspecified: Secondary | ICD-10-CM

## 2017-03-30 DIAGNOSIS — I7300 Raynaud's syndrome without gangrene: Secondary | ICD-10-CM

## 2017-04-01 DIAGNOSIS — J449 Chronic obstructive pulmonary disease, unspecified: Secondary | ICD-10-CM | POA: Diagnosis not present

## 2017-04-03 LAB — ALDOLASE: Aldolase: 4.8 U/L (ref <=8.1)

## 2017-04-10 ENCOUNTER — Telehealth: Payer: Self-pay | Admitting: Internal Medicine

## 2017-04-10 ENCOUNTER — Other Ambulatory Visit: Payer: Self-pay | Admitting: *Deleted

## 2017-04-10 DIAGNOSIS — R911 Solitary pulmonary nodule: Secondary | ICD-10-CM

## 2017-04-10 NOTE — Telephone Encounter (Signed)
Brandy Morales   Let [name] know that autoimmune normal and with CT chest several findings  but no fibrosis. She has emphysema though. Also, lung nodule that looks like a scar -> to be on safe side get PET scan - hopefully before she can see [place]  - get PET scan before seeing APP   0=- ONO on room air - please order - do not miss appt so she can discuss with aPP at fu  Thanks  Dr. Brand Males, M.D., [facility].C.P Pulmonary and Critical Care Medicine Staff Physician [place] [facility] Pager: [phone], If no answer or between  15:00h - 7:00h: call [phone]  04/10/2017 4:40 PM   .......................    1. Clustered subsolid posterior right upper lobe pulmonary nodules, largest 2.1 x 0.8 cm. Non-contrast chest CT at 3-6 months is recommended. If nodules persist, subsequent management will be based upon the most suspicious nodule(s). This recommendation follows the consensus statement: Guidelines for Management of Incidental Pulmonary Nodules Detected on CT Images: From the Fleischner Society 2017; Radiology 2017; 284:228-243. -> Sarah please consider PET and referral to [facility] for nodule study 2. 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 3. Severe centrilobular emphysema with diffuse bronchial wall thickening, suggesting COPD.-> sarah please consider spiriva 4. 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 Stable left adrenal adenoma. 6. Nonobstructing left nephrolithiasis.-> Judson Roch please alert patient about this  Aortic Atherosclerosis (ICD10-I70.0) and Emphysema (ICD10-J43.9).   Electronically Signed   By: Janina Mayo.D.

## 2017-04-10 NOTE — Telephone Encounter (Signed)
Called pt letting her know the results of the ct scan and that MR stated that it is best for pt to have a PET scan done and also an ONO on room air. Pt expressed understanding.  Placed orders for these to be done.  Nothing further needed.

## 2017-04-13 ENCOUNTER — Telehealth: Payer: Self-pay | Admitting: Internal Medicine

## 2017-04-13 NOTE — Telephone Encounter (Signed)
Pt was scheduled for her rov prior to her PET appt.  This has been rescheduled until after her PET.  Nothing further needed.

## 2017-04-20 ENCOUNTER — Ambulatory Visit: Payer: Self-pay | Admitting: Acute Care

## 2017-04-21 ENCOUNTER — Encounter: Payer: Self-pay | Admitting: Internal Medicine

## 2017-04-21 DIAGNOSIS — R0902 Hypoxemia: Secondary | ICD-10-CM | POA: Diagnosis not present

## 2017-04-21 DIAGNOSIS — J449 Chronic obstructive pulmonary disease, unspecified: Secondary | ICD-10-CM | POA: Diagnosis not present

## 2017-04-22 ENCOUNTER — Other Ambulatory Visit (HOSPITAL_COMMUNITY): Payer: PPO

## 2017-05-01 DIAGNOSIS — J449 Chronic obstructive pulmonary disease, unspecified: Secondary | ICD-10-CM | POA: Diagnosis not present

## 2017-05-07 ENCOUNTER — Telehealth: Payer: Self-pay | Admitting: Internal Medicine

## 2017-05-07 NOTE — Telephone Encounter (Signed)
ono on Brandy Morales [birth year]  on 04/21/17 shows pulse ox < 88% for 16 min. Can startt 1L [place] o2 but I can disuss with her at ov next week if she wants  Dr. Brand Males, M.D., [facility].C.P Pulmonary and Critical Care Medicine Staff Physician, [place] Director - Interstitial Lung Disease  Program  Pulmonary [place] at [place], [place], [address]  Pager: [phone], If no answer or between  15:00h - 7:00h: call [phone] Telephone: [phone]

## 2017-05-07 NOTE — Telephone Encounter (Signed)
Called pt letting her know the results of the ONO and that she did qualify to wear 1L O2 at night if she wanted to, or we could wait until her OV on 12/20 to discuss this with MR.  Pt stated to me that she wanted to wait to discuss this at her OV and that she sleeps fine at night.  I expressed understanding to pt wanting to wait. Nothing further needed.

## 2017-05-11 ENCOUNTER — Encounter (HOSPITAL_COMMUNITY): Payer: Self-pay | Admitting: Radiology

## 2017-05-11 ENCOUNTER — Telehealth: Payer: Self-pay

## 2017-05-11 ENCOUNTER — Ambulatory Visit (HOSPITAL_COMMUNITY)
Admission: RE | Admit: 2017-05-11 | Discharge: 2017-05-11 | Disposition: A | Discharge status: Home / Self Care | Payer: PPO | Source: Ambulatory Visit | Attending: Internal Medicine | Admitting: Internal Medicine

## 2017-05-11 DIAGNOSIS — N132 Hydronephrosis with renal and ureteral calculous obstruction: Secondary | ICD-10-CM | POA: Diagnosis not present

## 2017-05-11 DIAGNOSIS — R918 Other nonspecific abnormal finding of lung field: Secondary | ICD-10-CM | POA: Insufficient documentation

## 2017-05-11 DIAGNOSIS — R911 Solitary pulmonary nodule: Secondary | ICD-10-CM | POA: Diagnosis present

## 2017-05-11 LAB — GLUCOSE, CAPILLARY: GLUCOSE-CAPILLARY: 91 mg/dL (ref 65–99)

## 2017-05-11 MED ORDER — FLUDEOXYGLUCOSE F - 18 (FDG) INJECTION
8.4500 | Freq: Once | INTRAVENOUS | Status: AC | PRN
  Administered 2017-05-11: 8.45 via INTRAVENOUS

## 2017-05-11 NOTE — Telephone Encounter (Signed)
Received call from [facility] with [facility] radiology, who wanted to make MR aware that PET results are within epic.   IMPRESSION: 1. Decrease in size of right upper lobe pulmonary nodules with very mild metabolic activity is most consistent with a resolving infectious or inflammatory process. Recommend follow-up CT in 3 months. 2. Partially obstructing calculus at the LEFT ureteropelvic junction with mild hydronephrosis. Recommend urology consultation. These results will be called to the ordering clinician or representative by the Radiologist Assistant, and communication documented in the PACS or zVision Dashboard.  Will route to MR to make aware.

## 2017-05-12 NOTE — Telephone Encounter (Signed)
Let patien tknow and I will d/w her at folllowup

## 2017-05-13 NOTE — Telephone Encounter (Signed)
Called and spoke to pt. Informed her of the results and recs per MR. Pt verbalized understanding and denied any further questions or concerns at this time.   

## 2017-05-13 NOTE — Telephone Encounter (Signed)
Pt has pft and ov tomorrow.  MR please advise what exactly we need to let the patient know.  Thanks!

## 2017-05-13 NOTE — Telephone Encounter (Signed)
Let patient know that nodules have reduced in size and will discuss at fu

## 2017-05-14 ENCOUNTER — Encounter: Payer: Self-pay | Admitting: Internal Medicine

## 2017-05-14 ENCOUNTER — Telehealth: Payer: Self-pay | Admitting: Internal Medicine

## 2017-05-14 ENCOUNTER — Ambulatory Visit (INDEPENDENT_AMBULATORY_CARE_PROVIDER_SITE_OTHER): Payer: PPO | Admitting: Internal Medicine

## 2017-05-14 ENCOUNTER — Ambulatory Visit: Payer: PPO | Admitting: Internal Medicine

## 2017-05-14 VITALS — BP 116/64 | HR 73 | Ht 65.0 in | Wt 176.2 lb

## 2017-05-14 DIAGNOSIS — N201 Calculus of ureter: Secondary | ICD-10-CM | POA: Diagnosis not present

## 2017-05-14 DIAGNOSIS — I7300 Raynaud's syndrome without gangrene: Secondary | ICD-10-CM

## 2017-05-14 DIAGNOSIS — Z87891 Personal history of nicotine dependence: Secondary | ICD-10-CM

## 2017-05-14 DIAGNOSIS — J449 Chronic obstructive pulmonary disease, unspecified: Secondary | ICD-10-CM | POA: Diagnosis not present

## 2017-05-14 DIAGNOSIS — R0609 Other forms of dyspnea: Secondary | ICD-10-CM

## 2017-05-14 DIAGNOSIS — J9611 Chronic respiratory failure with hypoxia: Secondary | ICD-10-CM

## 2017-05-14 DIAGNOSIS — R918 Other nonspecific abnormal finding of lung field: Secondary | ICD-10-CM | POA: Diagnosis not present

## 2017-05-14 DIAGNOSIS — R0600 Dyspnea, unspecified: Secondary | ICD-10-CM

## 2017-05-14 DIAGNOSIS — R0902 Hypoxemia: Secondary | ICD-10-CM

## 2017-05-14 MED ORDER — TIOTROPIUM BROMIDE MONOHYDRATE 18 MCG IN CAPS: 18.0000 ug | ORAL_CAPSULE | Freq: Every day | RESPIRATORY_TRACT | 12 refills | Status: DC

## 2017-05-14 MED ORDER — ALBUTEROL SULFATE HFA 108 (90 BASE) MCG/ACT IN AERS: 2.0000 | INHALATION_SPRAY | Freq: Four times a day (QID) | RESPIRATORY_TRACT | 6 refills | Status: DC | PRN

## 2017-05-14 NOTE — Progress Notes (Signed)
PFT completed today 05/14/17

## 2017-05-14 NOTE — Patient Instructions (Addendum)
Raynaud's phenomenon without gangrene  - negative autoimmune test result 0- might be due to low oxygen levels of copd - will follow clinically for now - at some point can consider rheum referral  Stage 4 very severe COPD by GOLD classification ([place]) Chronic hypoxemic respiratory failure ([place])  - new diagnosi -0 at followup will check alpha 1 - continue breo and oxygen daily - start spiriva respimat daily - get prevnar details from pcpp [name], MD   Multiple lung nodules on CT - nov 2018 - improved on PET scan dec 2018 lowering chance this is cancer - will consider followup CT at followup  Ureteric calculus - refer urology because PET scan suggesting this is blocking and causing kidney swelling    Followup 3 months or sooner; CAT score at followup

## 2017-05-14 NOTE — Progress Notes (Signed)
Subjective:     Patient ID: Brandy Morales, female   DOB: [birth year], 71 y.o.   MRN: [id number]  HPI     IOV 03/18/2017  Chief Complaint  Patient presents with  . Pulm Consult    Pt referred by Dr. Inda Merlin MD for respiratory failure. Pt has SOB and wheezing with exertion. Pt on 2 liters on O2 [facility], been on O2 for two years. Pt fingers stays purple color and warm and feet stay cold and turn purple.     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 Dr. Inda Merlin is a primary care physician. She tells me that for the last 2 years she's been on chronic oxygen therapy. She uses the oxygen only on demand in the daytime based on subjective symptoms of dyspnea on exertion relieved by rest. Oxygen definitely helps her. She occasionally monitors her pulse ox and it would dip to 86% with exertion. She does not use the oxygen at night because she does not feel dyspneic atight. Is only occasional cough or wheezing. She has a previous 110 pack smoking history. She is not sure of the diagnosis COPD of pulmonary fibrosis but she reckons that his COPD based on the smoking history and previous history of wheezing and based on some other physician comments. Overall she's stable but at this point she wanted to see a pulmonologist there for she's been referred. Previously she was not interested. She does not recollect any pulmonary function tests a CT scan of the chest. She recollects that office chest x-ray to be abnormal. She is not on any inhaler therapy and this is because the oxygen therapy helped her.  In addition she does give a history of RaynaudThere is no prior history of autoimmune disease. Denies any pulmonary fibrosis history  Walking desaturation test 185 feet 3 laps on room air with a full head probe : Resting heart the resting pulse ox was 96% with a heart rate of 81/m.she walk with a full head probe. By the second lap she started wheezing  significantly. Breath and became short of breath. And tok a break , and at the second lap and the pulse ox was 89% with a heart rate of 101. At the third lap pulse ox was 90% with a heart rate of 99. She took 2 breaks. The only mild desaturation.   OV 05/14/2017  Chief Complaint  Patient presents with  . Follow-up    PFT done today, HRCT 03/30/17, PET scan done 12/17.  Pt states she has been doing good since last visit.  DME: [facility], 2L O2 pulse when out and 2L continuous at home.   Fu Raynaud Fu chronic hypoxemic respp failure  Here to discuss test result for above. Autoimmune panel negative. 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 Had 2 nodules - so we did PET Scan a month later in dec 2018 and nodules shrinking. 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 She is asymptomatic from this but has prior hx. OVerall resp status is stable and she is using 2-3L [place].     has no past medical history on file.   reports that she quit smoking about 2 years ago. Her smoking use included cigarettes. She has a 110.00 pack-year smoking history. she has never used smokeless tobacco.  Past Surgical History:  Procedure Laterality Date  . ABDOMINAL HYSTERECTOMY      Allergies  Allergen Reactions  . Statins Cough    Immunization  History  Administered Date(s) Administered  . Influenza Split 03/03/2016, 03/11/2017    No family history on file.   Current Outpatient Medications:  .  acyclovir (ZOVIRAX) 400 MG tablet, Take 400 mg by mouth 5 (five) times daily., Disp: , Rfl:  .  Artificial Tear Solution (GENTEAL TEARS OP), Apply 1 drop to eye daily., Disp: , Rfl:  .  Ascorbic Acid (VITAMIN C) 100 MG tablet, Take 100 mg by mouth daily., Disp: , Rfl:  .  BIOGAIA PROBIOTIC (BIOGAIA PROBIOTIC) LIQD, Take by mouth daily at 8 pm., Disp: , Rfl:  .  cholecalciferol (VITAMIN D) 1000 units tablet, Take 1,000 Units by mouth daily., Disp: , Rfl:  .  citalopram (CELEXA) 20 MG tablet, Take 20 mg by  mouth daily., Disp: , Rfl:  .  Coenzyme Q10 30 MG/5ML LIQD, Take 5 mLs by mouth daily., Disp: , Rfl:  .  fexofenadine (ALLEGRA) 180 MG tablet, Take 180 mg by mouth daily., Disp: , Rfl:  .  fluticasone (FLONASE) 50 MCG/ACT nasal spray, Place 2 sprays into both nostrils daily., Disp: , Rfl:  .  fluticasone furoate-vilanterol (BREO ELLIPTA) 200-25 MCG/INH AEPB, Inhale 1 puff into the lungs daily., Disp: , Rfl:  .  levalbuterol (XOPENEX) 1.25 MG/0.5ML nebulizer solution, Take 1.25 mg by nebulization every 4 (four) hours as needed for wheezing or shortness of breath., Disp: , Rfl:  .  losartan (COZAAR) 100 MG tablet, Take 100 mg by mouth daily., Disp: , Rfl:  .  Multiple Vitamins-Minerals (CENTRUM SILVER ADULT 50+ PO), Take by mouth daily at 12 noon., Disp: , Rfl:  .  naproxen sodium (ALEVE) 220 MG tablet, Take 220 mg by mouth daily., Disp: , Rfl:  .  omeprazole (PRILOSEC) 10 MG capsule, Take 10 mg by mouth daily., Disp: , Rfl:  .  OXYGEN, Inhale 2 L/mL into the lungs., Disp: , Rfl:  .  vitamin B-12 (CYANOCOBALAMIN) 100 MCG tablet, Take 100 mcg by mouth daily., Disp: , Rfl:     Review of Systems     Objective:   Physical Exam Vitals:   05/14/17 1226  BP: 116/64  Pulse: 73  SpO2: 96%  Weight: 176 lb 3.2 oz (79.9 kg)  Height: 5\' 5"  (1.651 m)    Estimated body mass index is 29.32 kg/m as calculated from the following:   Height as of this encounter: 5\' 5"  (1.651 m).   Weight as of this encounter: 176 lb 3.2 oz (79.9 kg).  Discussion only visit Sitting with o2 on    Assessment:       ICD-10-CM   1. 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 Stage 4 very severe COPD by GOLD classification ([place]) J44.9   3. Chronic hypoxemic respiratory failure ([facility]) J96.11   4. Multiple lung nodules on CT R91.8   5. Ureteric calculus N20.1        Plan:     Raynaud's phenomenon without gangrene  - negative autoimmune test result 0- might be due to low oxygen levels of copd - will  follow clinically for now - at some point can consider rheum referral  Stage 4 very severe COPD by GOLD classification ([place]) Chronic hypoxemic respiratory failure ([place])  - new diagnosi -0 at followup will check alpha 1 - continue breo and oxygen daily - start spiriva respimat daily  Multiple lung nodules on CT - nov 2018 - improved on PET scan dec 2018 lowering chance this is cancer - will consider followup CT at followup  Ureteric calculus - refer urology because PET scan suggesting this is blocking and causing kidney swelling    Followup 3 months or sooner; CAT score at followup  > 50% of this > 25 min visit spent in face to face counseling or coordination of care    Dr. Brand Males, M.D., [facility].C.P Pulmonary and Critical Care Medicine Staff Physician, [place] Director - Interstitial Lung Disease  Program  Pulmonary [place] at [place], [place], [address]  Pager: [phone], If no answer or between  15:00h - 7:00h: call [phone] Telephone: [phone]

## 2017-05-14 NOTE — Telephone Encounter (Signed)
Called and left a message for TJ letting her know that we needed records of pt's pneumonia vaccine faxed to our office so we can get it added in our system.  Gave her our fax number and our phone number if she needed anything from [place].  Waiting on the fax from her so we can get pt's pneumonia vaccine added.

## 2017-05-15 DIAGNOSIS — N201 Calculus of ureter: Secondary | ICD-10-CM | POA: Diagnosis not present

## 2017-06-01 DIAGNOSIS — J449 Chronic obstructive pulmonary disease, unspecified: Secondary | ICD-10-CM | POA: Diagnosis not present

## 2017-06-05 DIAGNOSIS — N201 Calculus of ureter: Secondary | ICD-10-CM | POA: Diagnosis not present

## 2017-07-02 DIAGNOSIS — J449 Chronic obstructive pulmonary disease, unspecified: Secondary | ICD-10-CM | POA: Diagnosis not present

## 2017-07-15 DIAGNOSIS — J9611 Chronic respiratory failure with hypoxia: Secondary | ICD-10-CM | POA: Diagnosis not present

## 2017-07-15 DIAGNOSIS — J441 Chronic obstructive pulmonary disease with (acute) exacerbation: Secondary | ICD-10-CM | POA: Diagnosis not present

## 2017-07-15 DIAGNOSIS — J449 Chronic obstructive pulmonary disease, unspecified: Secondary | ICD-10-CM | POA: Diagnosis not present

## 2017-07-15 DIAGNOSIS — N200 Calculus of kidney: Secondary | ICD-10-CM | POA: Diagnosis not present

## 2017-07-30 DIAGNOSIS — J449 Chronic obstructive pulmonary disease, unspecified: Secondary | ICD-10-CM | POA: Diagnosis not present

## 2017-07-31 DIAGNOSIS — N132 Hydronephrosis with renal and ureteral calculous obstruction: Secondary | ICD-10-CM | POA: Diagnosis not present

## 2017-07-31 DIAGNOSIS — N201 Calculus of ureter: Secondary | ICD-10-CM | POA: Diagnosis not present

## 2017-08-10 ENCOUNTER — Ambulatory Visit: Payer: PPO | Admitting: Internal Medicine

## 2017-08-14 ENCOUNTER — Encounter: Payer: Self-pay | Admitting: Internal Medicine

## 2017-08-14 ENCOUNTER — Ambulatory Visit: Payer: PPO | Admitting: Internal Medicine

## 2017-08-14 VITALS — BP 116/76 | HR 83 | Ht 65.0 in | Wt 175.6 lb

## 2017-08-14 DIAGNOSIS — J449 Chronic obstructive pulmonary disease, unspecified: Secondary | ICD-10-CM

## 2017-08-14 MED ORDER — FLUTICASONE FUROATE-VILANTEROL 200-25 MCG/INH IN AEPB: 1.0000 | INHALATION_SPRAY | Freq: Every day | RESPIRATORY_TRACT | 0 refills | Status: DC

## 2017-08-14 NOTE — Patient Instructions (Addendum)
ICD-10-CM   1. Stage 4 very severe COPD by GOLD classification ([place]) J44.9    Glad you are better from flare up 6 weeks go but still not at baseline  plan We discussed prednisone 5d to help you get better faster but after discussion we are holding off on that Discussed rehab - will hold off due to inconveneince it poses Continue spiriva - glad is helping Continue breo Continue o2 Please talk to PCP Brandy Huddle, MD -  and ensure you get  shingarix vaccine  Followup 6 months or sooner if needed; CAT score at followup

## 2017-08-14 NOTE — Progress Notes (Signed)
Subjective:     Patient ID: Brandy Morales, female   DOB: 10-12-1945, 72 y.o.   MRN: [id number]  HPI   IOV 03/18/2017  Chief Complaint  Patient presents with  . Pulm Consult    Pt referred by Dr. Inda Merlin MD for respiratory failure. Pt has SOB and wheezing with exertion. Pt on 2 liters on O2 [facility], been on O2 for two years. Pt fingers stays purple color and warm and feet stay cold and turn purple.     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 Dr. Inda Merlin is a primary care physician. She tells me that for the last 2 years she's been on chronic oxygen therapy. She uses the oxygen only on demand in the daytime based on subjective symptoms of dyspnea on exertion relieved by rest. Oxygen definitely helps her. She occasionally monitors her pulse ox and it would dip to 86% with exertion. She does not use the oxygen at night because she does not feel dyspneic atight. Is only occasional cough or wheezing. She has a previous 110 pack smoking history. She is not sure of the diagnosis COPD of pulmonary fibrosis but she reckons that his COPD based on the smoking history and previous history of wheezing and based on some other physician comments. Overall she's stable but at this point she wanted to see a pulmonologist there for she's been referred. Previously she was not interested. She does not recollect any pulmonary function tests a CT scan of the chest. She recollects that office chest x-ray to be abnormal. She is not on any inhaler therapy and this is because the oxygen therapy helped her.  In addition she does give a history of RaynaudThere is no prior history of autoimmune disease. Denies any pulmonary fibrosis history  Walking desaturation test 185 feet 3 laps on room air with a full head probe : Resting heart the resting pulse ox was 96% with a heart rate of 81/m.she walk with a full head probe. By the second lap she started wheezing  significantly. Breath and became short of breath. And tok a break , and at the second lap and the pulse ox was 89% with a heart rate of 101. At the third lap pulse ox was 90% with a heart rate of 99. She took 2 breaks. The only mild desaturation.   OV 05/14/2017  Chief Complaint  Patient presents with  . Follow-up    PFT done today, HRCT 03/30/17, PET scan done 12/17.  Pt states she has been doing good since last visit.  DME: [facility], 2L O2 pulse when out and 2L continuous at home.   Fu Raynaud Fu chronic hypoxemic respp failure  Here to discuss test result for above. Autoimmune panel negative. 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 Had 2 nodules - so we did PET Scan a month later in dec 2018 and nodules shrinking. 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 She is asymptomatic from this but has prior hx. OVerall resp status is stable and she is using 2-3L [place].   OV 08/14/2017  Chief Complaint  Patient presents with  . Follow-up    Pt stated she became sick x6 weeks ago and thought it could have been the flu.  Pt states she has had good and bad days. Pt has some congestion, stable SOB. Denies any CP/tightness.    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  Overall she is stable.  Some 6 weeks ago she picked up symptoms of a COPD exacerbation.  Saw primary care physician Dr. Inda Merlin.  Given antibiotics and prednisone but she only took the prednisone.  Did not take the antibiotics.  She is improved but still feels a little bit hoarse in her chest and has some cough that is not back at baseline yet.  COPD CAT score is 18.  At this point in time she is not interested in antibiotic or another course of prednisone and.  She feels she is naturally improving.  She asked about pulmonary rehabilitation but given the inconvenience it poses of parking at the hospital and having to use a wheelchair to get into  rehab at this point in time she wants to hold off.  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  Of note she did see urology for her stones by the nurse practitioner.  She is going to see the primary MD urologist.  She gets the feeling that she might be considered a high risk for surgery given her advanced COPD.    CAT COPD Symptom & Quality of Life Score (GSK trademark) 0 is no burden. 5 is highest burden 08/14/2017   Never Cough -> Cough all the time 2  No phlegm in chest -> Chest is full of phlegm 2  No chest tightness -> Chest feels very tight 0  No dyspnea for 1 flight stairs/hill -> Very dyspneic for 1 flight of stairs 3  No limitations for ADL at home -> Very limited with ADL at home 4  Confident leaving home -> Not at all confident leaving home 4  Sleep soundly -> Do not sleep soundly because of lung condition 4  Lots of Energy -> No energy at all 3  TOTAL Score (max 40)  18   Results for Brandy, Morales (MRN [id number]) as of 08/14/2017 16:57  Ref. Range 05/14/2017 10:30  FEV1-Pre Latest Units: L 0.63  FEV1-%Pred-Pre Latest Units: % 26  Pre FEV1/FVC ratio Latest Units: % 42  Results for Brandy, Morales (MRN [id number]) as of 08/14/2017 16:57  Ref. Range 05/14/2017 10:30  DLCO cor Latest Units: ml/min/mmHg 9.09  DLCO cor % pred Latest Units: % 35     has no past medical history on file.   reports that she quit smoking about 3 years ago. Her smoking use included cigarettes. She has a 110.00 pack-year smoking history. She has never used smokeless tobacco.  Past Surgical History:  Procedure Laterality Date  . ABDOMINAL HYSTERECTOMY      Allergies  Allergen Reactions  . Statins Cough    Immunization History  Administered Date(s) Administered  . Influenza Split 03/03/2016, 03/11/2017  . Pneumococcal Conjugate-13 01/27/2014  . Pneumococcal Polysaccharide-23 07/02/2008    No family history on file.   Current Outpatient Medications:  .   acyclovir (ZOVIRAX) 400 MG tablet, Take 400 mg by mouth 5 (five) times daily., Disp: , Rfl:  .  albuterol (PROVENTIL HFA;VENTOLIN HFA) 108 (90 Base) MCG/ACT inhaler, Inhale 2 puffs into the lungs every 6 (six) hours as needed for wheezing or shortness of breath., Disp: 1 Inhaler, Rfl: 6 .  Artificial Tear Solution (GENTEAL TEARS OP), Apply 1 drop to eye daily., Disp: , Rfl:  .  Ascorbic Acid (VITAMIN C) 100 MG tablet, Take 100 mg by mouth daily., Disp: , Rfl:  .  BIOGAIA PROBIOTIC (BIOGAIA PROBIOTIC) LIQD, Take by mouth daily at 8  pm., Disp: , Rfl:  .  cholecalciferol (VITAMIN D) 1000 units tablet, Take 1,000 Units by mouth daily., Disp: , Rfl:  .  citalopram (CELEXA) 20 MG tablet, Take 20 mg by mouth daily., Disp: , Rfl:  .  Coenzyme Q10 30 MG/5ML LIQD, Take 5 mLs by mouth daily., Disp: , Rfl:  .  fexofenadine (ALLEGRA) 180 MG tablet, Take 180 mg by mouth daily., Disp: , Rfl:  .  fluticasone (FLONASE) 50 MCG/ACT nasal spray, Place 2 sprays into both nostrils daily., Disp: , Rfl:  .  fluticasone furoate-vilanterol (BREO ELLIPTA) 200-25 MCG/INH AEPB, Inhale 1 puff into the lungs daily., Disp: , Rfl:  .  levalbuterol (XOPENEX) 1.25 MG/0.5ML nebulizer solution, Take 1.25 mg by nebulization every 4 (four) hours as needed for wheezing or shortness of breath., Disp: , Rfl:  .  losartan (COZAAR) 100 MG tablet, Take 100 mg by mouth daily., Disp: , Rfl:  .  Multiple Vitamins-Minerals (CENTRUM SILVER ADULT 50+ PO), Take by mouth daily at 12 noon., Disp: , Rfl:  .  naproxen sodium (ALEVE) 220 MG tablet, Take 220 mg by mouth daily., Disp: , Rfl:  .  omeprazole (PRILOSEC) 10 MG capsule, Take 10 mg by mouth daily., Disp: , Rfl:  .  OXYGEN, Inhale 2 L/mL into the lungs., Disp: , Rfl:  .  tiotropium (SPIRIVA HANDIHALER) 18 MCG inhalation capsule, Place 1 capsule (18 mcg total) into inhaler and inhale daily., Disp: 30 capsule, Rfl: 12 .  vitamin B-12 (CYANOCOBALAMIN) 100 MCG tablet, Take 100 mcg by mouth daily.,  Disp: , Rfl:      has no past medical history on file.   reports that she quit smoking about 3 years ago. Her smoking use included cigarettes. She has a 110.00 pack-year smoking history. She has never used smokeless tobacco.  Past Surgical History:  Procedure Laterality Date  . ABDOMINAL HYSTERECTOMY      Allergies  Allergen Reactions  . Statins Cough    Immunization History  Administered Date(s) Administered  . Influenza Split 03/03/2016, 03/11/2017  . Pneumococcal Conjugate-13 01/27/2014  . Pneumococcal Polysaccharide-23 07/02/2008    No family history on file.   Current Outpatient Medications:  .  acyclovir (ZOVIRAX) 400 MG tablet, Take 400 mg by mouth 5 (five) times daily., Disp: , Rfl:  .  albuterol (PROVENTIL HFA;VENTOLIN HFA) 108 (90 Base) MCG/ACT inhaler, Inhale 2 puffs into the lungs every 6 (six) hours as needed for wheezing or shortness of breath., Disp: 1 Inhaler, Rfl: 6 .  Artificial Tear Solution (GENTEAL TEARS OP), Apply 1 drop to eye daily., Disp: , Rfl:  .  Ascorbic Acid (VITAMIN C) 100 MG tablet, Take 100 mg by mouth daily., Disp: , Rfl:  .  BIOGAIA PROBIOTIC (BIOGAIA PROBIOTIC) LIQD, Take by mouth daily at 8 pm., Disp: , Rfl:  .  cholecalciferol (VITAMIN D) 1000 units tablet, Take 1,000 Units by mouth daily., Disp: , Rfl:  .  citalopram (CELEXA) 20 MG tablet, Take 20 mg by mouth daily., Disp: , Rfl:  .  Coenzyme Q10 30 MG/5ML LIQD, Take 5 mLs by mouth daily., Disp: , Rfl:  .  fexofenadine (ALLEGRA) 180 MG tablet, Take 180 mg by mouth daily., Disp: , Rfl:  .  fluticasone (FLONASE) 50 MCG/ACT nasal spray, Place 2 sprays into both nostrils daily., Disp: , Rfl:  .  fluticasone furoate-vilanterol (BREO ELLIPTA) 200-25 MCG/INH AEPB, Inhale 1 puff into the lungs daily., Disp: , Rfl:  .  levalbuterol (XOPENEX) 1.25 MG/0.5ML nebulizer solution,  Take 1.25 mg by nebulization every 4 (four) hours as needed for wheezing or shortness of breath., Disp: , Rfl:  .  losartan  (COZAAR) 100 MG tablet, Take 100 mg by mouth daily., Disp: , Rfl:  .  Multiple Vitamins-Minerals (CENTRUM SILVER ADULT 50+ PO), Take by mouth daily at 12 noon., Disp: , Rfl:  .  naproxen sodium (ALEVE) 220 MG tablet, Take 220 mg by mouth daily., Disp: , Rfl:  .  omeprazole (PRILOSEC) 10 MG capsule, Take 10 mg by mouth daily., Disp: , Rfl:  .  OXYGEN, Inhale 2 L/mL into the lungs., Disp: , Rfl:  .  tiotropium (SPIRIVA HANDIHALER) 18 MCG inhalation capsule, Place 1 capsule (18 mcg total) into inhaler and inhale daily., Disp: 30 capsule, Rfl: 12 .  vitamin B-12 (CYANOCOBALAMIN) 100 MCG tablet, Take 100 mcg by mouth daily., Disp: , Rfl:    Review of Systems     Objective:   Physical Exam  Constitutional: She is oriented to person, place, and time. She appears well-developed and well-nourished. No distress.  HENT:  Head: Normocephalic and atraumatic.  Right Ear: External ear normal.  Left Ear: External ear normal.  Mouth/Throat: Oropharynx is clear and moist. No oropharyngeal exudate.  o32 on  Eyes: Pupils are equal, round, and reactive to light. Conjunctivae and EOM are normal. Right eye exhibits no discharge. Left eye exhibits no discharge. No scleral icterus.  Neck: Normal range of motion. Neck supple. No JVD present. No tracheal deviation present. No thyromegaly present.  Cardiovascular: Normal rate, regular rhythm, normal heart sounds and intact distal pulses. Exam reveals no gallop and no friction rub.  No murmur heard. Pulmonary/Chest: Effort normal and breath sounds normal. No respiratory distress. She has no wheezes. She has no rales. She exhibits no tenderness.  barrell chest  Abdominal: Soft. Bowel sounds are normal. She exhibits no distension and no mass. There is no tenderness. There is no rebound and no guarding.  Musculoskeletal: Normal range of motion. She exhibits no edema or tenderness.  Lymphadenopathy:    She has no cervical adenopathy.  Neurological: She is alert and  oriented to person, place, and time. She has normal reflexes. No cranial nerve deficit. She exhibits normal muscle tone. Coordination normal.  Skin: Skin is warm and dry. No rash noted. She is not diaphoretic. No erythema. No pallor.  Psychiatric: She has a normal mood and affect. Her behavior is normal. Judgment and thought content normal.  Vitals reviewed.  Vitals:   08/14/17 1556  BP: 116/76  Pulse: 83  SpO2: 95%  Weight: 175 lb 9.6 oz (79.7 kg)  Height: 5\' 5"  (1.651 m)     Estimated body mass index is 29.22 kg/m as calculated from the following:   Height as of this encounter: 5\' 5"  (1.651 m).   Weight as of this encounter: 175 lb 9.6 oz (79.7 kg).     Assessment:       ICD-10-CM   1. Stage 4 very severe COPD by GOLD classification ([place]) J44.9        Plan:      Glad you are better from flare up 6 weeks go but still not at baseline  plan We discussed prednisone 5d to help you get better faster but after discussion we are holding off on that Discussed rehab - will hold off due to inconveneince it poses Continue spiriva - glad is helping Continue breo Continue o2 Please talk to PCP [name], MD -  and ensure you get  shingarix vaccine  Followup 6 months or sooner if needed; CAT score at followup    Dr. Brand Males, M.D., [facility].C.P Pulmonary and Critical Care Medicine Staff Physician, [place] Director - Interstitial Lung Disease  Program  Pulmonary [place] at [place], [place], [address]  Pager: [phone], If no answer or between  15:00h - 7:00h: call [phone] Telephone: [phone]

## 2017-08-18 ENCOUNTER — Telehealth: Payer: Self-pay | Admitting: Internal Medicine

## 2017-08-18 MED ORDER — PREDNISONE 10 MG PO TABS: ORAL_TABLET | ORAL | 0 refills | Status: DC

## 2017-08-18 NOTE — Telephone Encounter (Signed)
Do Please take prednisone 40 mg x1 day, then 30 mg x1 day, then 20 mg x1 day, then 10 mg x1 day, and then 5 mg x1 day and stop   Dr. Brand Males, M.D., [facility].C.P Pulmonary and Critical Care Medicine Staff Physician, [place] Director - Interstitial Lung Disease  Program  Pulmonary [place] at [place], [place], [address]  Pager: [phone], If no answer or between  15:00h - 7:00h: call [phone] Telephone: [phone]

## 2017-08-18 NOTE — Telephone Encounter (Signed)
Spoke with patient. She was seen by MR last Friday 08/14/17 and had a discussion with him about continuing to be on prednisone. At the time of visit, she decided that the she wanted to hold off on the prednisone. Now, she feels that she needs it.   She also stated that she already has an abx, so she just needs the prednisone.   [facility] [facility]   MR, please advise. Thanks!

## 2017-08-18 NOTE — Telephone Encounter (Signed)
Called pt and advised message from the provider. Pt understood and verbalized understanding. Nothing further is needed.   Rx sent into the pharmacy.

## 2017-08-28 DIAGNOSIS — N3000 Acute cystitis without hematuria: Secondary | ICD-10-CM | POA: Diagnosis not present

## 2017-08-28 DIAGNOSIS — N201 Calculus of ureter: Secondary | ICD-10-CM | POA: Diagnosis not present

## 2017-08-28 DIAGNOSIS — R310 Gross hematuria: Secondary | ICD-10-CM | POA: Diagnosis not present

## 2017-08-30 DIAGNOSIS — J449 Chronic obstructive pulmonary disease, unspecified: Secondary | ICD-10-CM | POA: Diagnosis not present

## 2017-09-04 ENCOUNTER — Telehealth: Payer: Self-pay | Admitting: Internal Medicine

## 2017-09-04 NOTE — Telephone Encounter (Signed)
Going to have urological surgery. Probably ok but best Judson Roch or Tammy see her in th eoffice and run a pre-op risk calculator on her and advise  Her and [facility]   Dr. Brand Males, M.D., [facility].C.P Pulmonary and Critical Care Medicine Staff Physician, [place] Director - Interstitial Lung Disease  Program  Pulmonary [place] at [place], [place], [address]  Pager: [phone], If no answer or between  15:00h - 7:00h: call [phone] Telephone: [phone]

## 2017-09-07 NOTE — Telephone Encounter (Signed)
Called and spoke with pt letting her know that we had received the surgical clearance request from [facility] due to the procedure that is needing to be done by them.  Stated to pt anytime when pts are needing procedures to be done, clearance by other specialists that pt sees needs to be done and that was why we received.  Pt expressed understanding. Scheduled pt an appt with TP 5/30 at 9am.  Will fax this information to [facility] to make them aware appt has been scheduled.

## 2017-09-07 NOTE — Telephone Encounter (Signed)
[facility] needs a medical clearance from MR [phone]

## 2017-09-10 DIAGNOSIS — Z0000 Encounter for general adult medical examination without abnormal findings: Secondary | ICD-10-CM | POA: Diagnosis not present

## 2017-09-10 DIAGNOSIS — N200 Calculus of kidney: Secondary | ICD-10-CM | POA: Diagnosis not present

## 2017-09-10 DIAGNOSIS — E559 Vitamin D deficiency, unspecified: Secondary | ICD-10-CM | POA: Diagnosis not present

## 2017-09-10 DIAGNOSIS — Z87442 Personal history of urinary calculi: Secondary | ICD-10-CM | POA: Diagnosis not present

## 2017-09-10 DIAGNOSIS — K5730 Diverticulosis of large intestine without perforation or abscess without bleeding: Secondary | ICD-10-CM | POA: Diagnosis not present

## 2017-09-10 DIAGNOSIS — J449 Chronic obstructive pulmonary disease, unspecified: Secondary | ICD-10-CM | POA: Diagnosis not present

## 2017-09-10 DIAGNOSIS — Z1389 Encounter for screening for other disorder: Secondary | ICD-10-CM | POA: Diagnosis not present

## 2017-09-10 DIAGNOSIS — I10 Essential (primary) hypertension: Secondary | ICD-10-CM | POA: Diagnosis not present

## 2017-09-10 DIAGNOSIS — R5382 Chronic fatigue, unspecified: Secondary | ICD-10-CM | POA: Diagnosis not present

## 2017-09-10 DIAGNOSIS — H61899 Other specified disorders of external ear, unspecified ear: Secondary | ICD-10-CM | POA: Diagnosis not present

## 2017-09-10 DIAGNOSIS — J9611 Chronic respiratory failure with hypoxia: Secondary | ICD-10-CM | POA: Diagnosis not present

## 2017-09-10 DIAGNOSIS — Z0001 Encounter for general adult medical examination with abnormal findings: Secondary | ICD-10-CM | POA: Diagnosis not present

## 2017-09-29 DIAGNOSIS — J449 Chronic obstructive pulmonary disease, unspecified: Secondary | ICD-10-CM | POA: Diagnosis not present

## 2017-10-22 ENCOUNTER — Ambulatory Visit: Payer: PPO | Admitting: Adult Health

## 2017-10-22 ENCOUNTER — Encounter: Payer: Self-pay | Admitting: Adult Health

## 2017-10-22 DIAGNOSIS — J9611 Chronic respiratory failure with hypoxia: Secondary | ICD-10-CM | POA: Diagnosis not present

## 2017-10-22 DIAGNOSIS — J449 Chronic obstructive pulmonary disease, unspecified: Secondary | ICD-10-CM | POA: Diagnosis not present

## 2017-10-22 DIAGNOSIS — R911 Solitary pulmonary nodule: Secondary | ICD-10-CM | POA: Diagnosis not present

## 2017-10-22 DIAGNOSIS — Z01818 Encounter for other preprocedural examination: Secondary | ICD-10-CM | POA: Diagnosis not present

## 2017-10-22 DIAGNOSIS — R918 Other nonspecific abnormal finding of lung field: Secondary | ICD-10-CM | POA: Diagnosis not present

## 2017-10-22 NOTE — Assessment & Plan Note (Signed)
Pulmonary preop clearance-patient has a moderate pulmonary risk due to her very severe COPD and oxygen dependence.  However she is independent and currently well controlled on her regimen.  I went over her potential pulmonary risk and potential pulmonary postop complications.  Major Pulmonary risks identified in the multifactorial risk analysis are but not limited to a) pneumonia; b) recurrent intubation risk; c) prolonged or recurrent acute respiratory failure needing mechanical ventilation; d) prolonged hospitalization; e) DVT/Pulmonary embolism; f) Acute Pulmonary edema  Recommend 1. Short duration of surgery as much as possible and avoid paralytic if possible 2. Recovery in step down or ICU with Pulmonary consultation if needed 3. DVT prophylaxis if indicated  4. Aggressive pulmonary toilet with o2, bronchodilatation, and incentive spirometry and early ambulation

## 2017-10-22 NOTE — Assessment & Plan Note (Signed)
To new on oxygen 2 L with activity.  Goal is to have O2 saturations greater than 90%.

## 2017-10-22 NOTE — Progress Notes (Signed)
@Patient  ID: Brandy Morales, female    DOB: 08/12/1945, 72 y.o.   MRN: [id number]  Chief Complaint  Patient presents with  . Follow-up    COPD     Referring provider: [name], MD  HPI: 72 year old female former smoker followed for GOLD IV COPD and hypoxic respiratory failure on 2 L O2   10/22/2017 Follow up : COPD , O2 RF , preop clearance Patient returns for a follow-up for COPD.  Patient has underlying very severe COPD.  She is on oxygen 2 L with activity.  She says overall that her breathing has been doing well.  She is recently retired 1 month ago.  And feels that she has been doing well.  She tries to go out each day and walk at a local grocery store.  She does light housework and cooking.  She lives alone and drives. BREO and Spiriva . ,Last abx/steroids was in March 2019 .  She says she has a very good appetite with no nausea vomiting or diarrhea.  No weight loss or hemoptysis. He does get short of breath with prolonged walking or heavy activity.  She has a stop and rest often.  Previous CT chest November 2018 had showed clustered right upper lobe pulmonary nodules largest measuring 2.1 cm.  Subsequent PET scan in December 2018 showed shrinkage and pulmonary nodules with very mild metabolic activity consistent with a resolving infectious or inflammatory process.  She was recommended for a follow-up CT chest.   Patient says that she has been dealing with kidney stones.  And needs to undergo a urological procedure to remove her kidney stones.  She has been following with Dr. Gloriann Loan at [facility] urology.  She is here for a pulmonary preop clearance.   [place] Postperative Pulmonary Risk Score Comment Score  Type of surgery - abd ao aneurysm (27), thoracic (21), neurosurgery / upper abdominal / vascular (21), neck (11) 5   Emergency Surgery - (11) 0   ALbumin < 3 or poor nutritional state - (9) 0   BUN > 30 -  (8) 0   Partial or completely dependent functional status - (7) 0    COPD -  (6) 6   Age - 60 to 41 (4), > 70  (6) 6   TOTAL    Risk Stratifcation scores  - < 10, 11-19, 20-27, 28-40, >40 17      CANET Postperative Pulmonary Risk Score Comment Score  Age - <50 (0), 50-80 (3), >80 (16) 3   Preoperative pulse ox - >96 (0), 91-95 (8), <90 (24) 0   Respiratory infection in last month - Yes (17) 0   Preoperative anemia - < 10gm% - Yes (11) 0   Surgical incision - Upper abdominal (15), Thoracic (24) 0   Duration of surgery - <2h (0), 2-3h (16), >3h (23) 0   Emergency Surgery - Yes (8) 0   TOTAL    Risk Stratification - Low (<26), Intermediate (26-44), High (>45) 3         Allergies  Allergen Reactions  . Statins Cough    Immunization History  Administered Date(s) Administered  . Influenza Split 03/03/2016, 03/11/2017  . Pneumococcal Conjugate-13 01/27/2014  . Pneumococcal Polysaccharide-23 07/02/2008    History reviewed. No pertinent past medical history.  Tobacco History: Social History   Tobacco Use  Smoking Status Former Smoker  . Packs/day: 2.00  . Years: 55.00  . Pack years: 110.00  . Types: Cigarettes  . Last attempt to  quit: 2016  . Years since quitting: 3.4  Smokeless Tobacco Never Used   Counseling given: Not Answered   Outpatient Encounter Medications as of 10/22/2017  Medication Sig  . acyclovir (ZOVIRAX) 400 MG tablet Take 400 mg by mouth 5 (five) times daily.  Marland Kitchen albuterol (PROVENTIL HFA;VENTOLIN HFA) 108 (90 Base) MCG/ACT inhaler Inhale 2 puffs into the lungs every 6 (six) hours as needed for wheezing or shortness of breath.  . Artificial Tear Solution (GENTEAL TEARS OP) Apply 1 drop to eye daily.  Marland Kitchen BIOGAIA PROBIOTIC (BIOGAIA PROBIOTIC) LIQD Take by mouth daily at 8 pm.  . cholecalciferol (VITAMIN D) 1000 units tablet Take 1,000 Units by mouth daily.  . citalopram (CELEXA) 20 MG tablet Take 20 mg by mouth daily.  . Coenzyme Q10 30 MG/5ML LIQD Take 5 mLs by mouth daily.  . fexofenadine (ALLEGRA) 180 MG tablet Take  180 mg by mouth daily.  . fluticasone (FLONASE) 50 MCG/ACT nasal spray Place 2 sprays into both nostrils daily.  . fluticasone furoate-vilanterol (BREO ELLIPTA) 200-25 MCG/INH AEPB Inhale 1 puff into the lungs daily.  Marland Kitchen levalbuterol (XOPENEX) 1.25 MG/0.5ML nebulizer solution Take 1.25 mg by nebulization every 4 (four) hours as needed for wheezing or shortness of breath.  . losartan (COZAAR) 100 MG tablet Take 100 mg by mouth daily.  . Multiple Vitamins-Minerals (CENTRUM SILVER ADULT 50+ PO) Take by mouth daily at 12 noon.  . naproxen sodium (ALEVE) 220 MG tablet Take 220 mg by mouth daily.  Marland Kitchen omeprazole (PRILOSEC) 10 MG capsule Take 10 mg by mouth daily.  . OXYGEN Inhale 2 L/mL into the lungs.  . tiotropium (SPIRIVA HANDIHALER) 18 MCG inhalation capsule Place 1 capsule (18 mcg total) into inhaler and inhale daily.  . vitamin B-12 (CYANOCOBALAMIN) 100 MCG tablet Take 100 mcg by mouth daily.  . Ascorbic Acid (VITAMIN C) 100 MG tablet Take 100 mg by mouth daily.  . [DISCONTINUED] fluticasone furoate-vilanterol (BREO ELLIPTA) 200-25 MCG/INH AEPB Inhale 1 puff into the lungs daily. (Patient not taking: Reported on 10/22/2017)  . [DISCONTINUED] predniSONE (DELTASONE) 10 MG tablet Take 4 tabs x 1 days, 3 tabs x 1 days,  2 tab x 1 days, 1 tab X 1 day and stop. (Patient not taking: Reported on 10/22/2017)   No facility-administered encounter medications on file as of 10/22/2017.      Review of Systems  Constitutional:   No  weight loss, night sweats,  Fevers, chills,  +fatigue, or  lassitude.  HEENT:   No headaches,  Difficulty swallowing,  Tooth/dental problems, or  Sore throat,                No sneezing, itching, ear ache, nasal congestion, post nasal drip,   CV:  No chest pain,  Orthopnea, PND, swelling in lower extremities, anasarca, dizziness, palpitations, syncope.   GI  No heartburn, indigestion, abdominal pain, nausea, vomiting, diarrhea, change in bowel habits, loss of appetite, bloody  stools.   Resp:    No chest wall deformity  Skin: no rash or lesions.  GU: no dysuria, change in color of urine, no urgency or frequency.  No flank pain, no hematuria   MS:  No joint pain or swelling.  No decreased range of motion.  No back pain.    Physical Exam  BP 104/72 (BP Location: Left Arm, Cuff Size: Normal)   Pulse 87   Ht 5\' 4"  (1.626 m)   Wt 178 lb 3.2 oz (80.8 kg)   SpO2 96%  BMI 30.59 kg/m   GEN: A/Ox3; pleasant , NAD, well nourished    HEENT:  [place]/AT,  EACs-clear, TMs-wnl, NOSE-clear, THROAT-clear, no lesions, no postnasal drip or exudate noted.   NECK:  Supple w/ fair ROM; no JVD; normal carotid impulses w/o bruits; no thyromegaly or nodules palpated; no lymphadenopathy.    RESP decreased breath sounds in the bases  no accessory muscle use, no dullness to percussion  CARD:  RRR, no m/r/g, no peripheral edema, pulses intact, no cyanosis or clubbing.  GI:   Soft & nt; nml bowel sounds; no organomegaly or masses detected.   Musco: Warm bil, no deformities or joint swelling noted.   Neuro: alert, no focal deficits noted.    Skin: Warm, no lesions or rashes    Lab Results:  CBC No results found for: WBC, RBC, HGB, HCT, PLT, MCV, MCH, MCHC, RDW, LYMPHSABS, MONOABS, EOSABS, BASOSABS  BMET No results found for: NA, K, CL, CO2, GLUCOSE, BUN, CREATININE, CALCIUM, GFRNONAA, GFRAA  BNP No results found for: BNP  ProBNP No results found for: PROBNP  Imaging: No results found.   Assessment & Plan:   COPD (chronic obstructive pulmonary disease) ([place]) Very severe COPD currently stable on present regimen. Plan continue on Brio and Spiriva.  Continue with activity as tolerated.  Chronic respiratory failure with hypoxia (HCC) To new on oxygen 2 L with activity.  Goal is to have O2 saturations greater than 90%.  Pulmonary nodules Right upper lobe pulmonary nodules noted on CT chest November 2018.  Subsequent PET scan showed decrease in nodules and very  mild hypermetabolic activity.  Consistent with a resolving infectious inflammatory process.  Patient will have a follow-up CT chest/to verify resolution of nodules.  Preoperative clearance Pulmonary preop clearance-patient has a moderate pulmonary risk due to her very severe COPD and oxygen dependence.  However she is independent and currently well controlled on her regimen.  I went over her potential pulmonary risk and potential pulmonary postop complications.  Major Pulmonary risks identified in the multifactorial risk analysis are but not limited to a) pneumonia; b) recurrent intubation risk; c) prolonged or recurrent acute respiratory failure needing mechanical ventilation; d) prolonged hospitalization; e) DVT/Pulmonary embolism; f) Acute Pulmonary edema  Recommend 1. Short duration of surgery as much as possible and avoid paralytic if possible 2. Recovery in step down or ICU with Pulmonary consultation if needed 3. DVT prophylaxis if indicated  4. Aggressive pulmonary toilet with o2, bronchodilatation, and incentive spirometry and early ambulation        Rexene Edison, NP 10/22/2017

## 2017-10-22 NOTE — Assessment & Plan Note (Signed)
Right upper lobe pulmonary nodules noted on CT chest November 2018.  Subsequent PET scan showed decrease in nodules and very mild hypermetabolic activity.  Consistent with a resolving infectious inflammatory process.  Patient will have a follow-up CT chest/to verify resolution of nodules.

## 2017-10-22 NOTE — Assessment & Plan Note (Signed)
Very severe COPD currently stable on present regimen. Plan continue on Brio and Spiriva.  Continue with activity as tolerated.

## 2017-10-22 NOTE — Patient Instructions (Signed)
Continue on Breo and Spiriva , rinse after use.  Continue on Oxygen 2l/m .  Good luck with upcoming surgery .  Follow up with Dr. Chase Caller in 3 months and As needed

## 2017-10-26 DIAGNOSIS — N750 Cyst of Bartholin's gland: Secondary | ICD-10-CM | POA: Diagnosis not present

## 2017-10-26 DIAGNOSIS — Z01411 Encounter for gynecological examination (general) (routine) with abnormal findings: Secondary | ICD-10-CM | POA: Diagnosis not present

## 2017-10-28 DIAGNOSIS — L439 Lichen planus, unspecified: Secondary | ICD-10-CM | POA: Diagnosis not present

## 2017-10-28 DIAGNOSIS — D225 Melanocytic nevi of trunk: Secondary | ICD-10-CM | POA: Diagnosis not present

## 2017-10-30 DIAGNOSIS — J449 Chronic obstructive pulmonary disease, unspecified: Secondary | ICD-10-CM | POA: Diagnosis not present

## 2017-11-05 ENCOUNTER — Other Ambulatory Visit: Payer: Self-pay | Admitting: Adult Health

## 2017-11-05 ENCOUNTER — Ambulatory Visit (INDEPENDENT_AMBULATORY_CARE_PROVIDER_SITE_OTHER)
Admission: RE | Admit: 2017-11-05 | Discharge: 2017-11-05 | Disposition: A | Discharge status: Home / Self Care | Payer: PPO | Source: Ambulatory Visit | Attending: Adult Health | Admitting: Adult Health

## 2017-11-05 DIAGNOSIS — R911 Solitary pulmonary nodule: Secondary | ICD-10-CM | POA: Diagnosis not present

## 2017-11-05 DIAGNOSIS — R918 Other nonspecific abnormal finding of lung field: Secondary | ICD-10-CM | POA: Diagnosis not present

## 2017-11-09 DIAGNOSIS — J449 Chronic obstructive pulmonary disease, unspecified: Secondary | ICD-10-CM | POA: Diagnosis not present

## 2017-11-09 DIAGNOSIS — I10 Essential (primary) hypertension: Secondary | ICD-10-CM | POA: Diagnosis not present

## 2017-11-09 DIAGNOSIS — J441 Chronic obstructive pulmonary disease with (acute) exacerbation: Secondary | ICD-10-CM | POA: Diagnosis not present

## 2017-11-09 DIAGNOSIS — D4100 Neoplasm of uncertain behavior of unspecified kidney: Secondary | ICD-10-CM | POA: Diagnosis not present

## 2017-11-09 DIAGNOSIS — E782 Mixed hyperlipidemia: Secondary | ICD-10-CM | POA: Diagnosis not present

## 2017-11-15 ENCOUNTER — Encounter: Payer: Self-pay | Admitting: Internal Medicine

## 2017-11-24 DIAGNOSIS — N201 Calculus of ureter: Secondary | ICD-10-CM | POA: Diagnosis not present

## 2017-11-24 DIAGNOSIS — N132 Hydronephrosis with renal and ureteral calculous obstruction: Secondary | ICD-10-CM | POA: Diagnosis not present

## 2017-11-24 LAB — PULMONARY FUNCTION TEST
DL/VA % PRED: 45 %
DL/VA: 2.24 ml/min/mmHg/L
DLCO COR: 9.09 ml/min/mmHg
DLCO cor % pred: 35 %
DLCO unc % pred: 37 %
DLCO unc: 9.57 ml/min/mmHg
FEF 25-75 PRE: 0.23 L/s
FEF2575-%PRED-PRE: 12 %
FEV1-%Pred-Pre: 26 %
FEV1-PRE: 0.63 L
FEV1FVC-%Pred-Pre: 54 %
FEV6-%Pred-Pre: 49 %
FEV6-PRE: 1.45 L
FEV6FVC-%Pred-Pre: 100 %
FVC-%Pred-Pre: 48 %
FVC-Pre: 1.5 L
Pre FEV1/FVC ratio: 42 %
Pre FEV6/FVC Ratio: 97 %
RV % pred: 366 %
RV: 8.29 L
TLC % PRED: 192 %
TLC: 9.98 L

## 2017-11-24 NOTE — Patient Instructions (Addendum)
Brandy Morales  11/24/2017   Your procedure is scheduled on: 11-30-17  Report to [facility] Main  Entrance    Report to Admitting at 11:30 AM    Call this number if you have problems the morning of surgery [phone]   Remember: Do not eat food or drink liquids :After Midnight. You may have a Clear Liquid Diet from Midnight until 7:30 AM. After 7:30 AM, nothing until after surgery.     CLEAR LIQUID DIET   Foods Allowed                                                                     Foods Excluded  Coffee and tea, regular and decaf                             liquids that you cannot  Plain Jell-O in any flavor                                             see through such as: Fruit ices (not with fruit pulp)                                     milk, soups, orange juice  Iced Popsicles                                    All solid food Carbonated beverages, regular and diet                                    Cranberry, grape and apple juices Sports drinks like Gatorade Lightly seasoned clear broth or consume(fat free) Sugar, honey syrup  Sample Menu Breakfast                                Lunch                                     Supper Cranberry juice                    Beef broth                            Chicken broth Jell-O                                     Grape juice  Apple juice Coffee or tea                        Jell-O                                      Popsicle                                                Coffee or tea                        Coffee or tea  _____________________________________________________________________     Take these medicines the morning of surgery with A SIP OF WATER: Citalopram (Celexa), Omeprazole (Prilosec). You may bring and use your eyedrops and inhaler as needed.                                You may not have any metal on your body including hair pins and   piercings  Do not wear jewelry, make-up, lotions, powders or perfumes, deodorant             Do not wear nail polish.  Do not shave  48 hours prior to surgery.                 Do not bring valuables to the hospital. [place].  Contacts, dentures or bridgework may not be worn into surgery.      Patients discharged the day of surgery will not be allowed to drive home.  Name and phone number of your driver: Teressa Lower [phone]  Special Instructions: N/A              Please read over the following fact sheets you were given: _____________________________________________________________________          [facility] - Preparing for Surgery Before surgery, you can play an important role.  Because skin is not sterile, your skin needs to be as free of germs as possible.  You can reduce the number of germs on your skin by washing with CHG (chlorahexidine gluconate) soap before surgery.  CHG is an antiseptic cleaner which kills germs and bonds with the skin to continue killing germs even after washing. Please DO NOT use if you have an allergy to CHG or antibacterial soaps.  If your skin becomes reddened/irritated stop using the CHG and inform your nurse when you arrive at Short Stay. Do not shave (including legs and underarms) for at least 48 hours prior to the first CHG shower.  You may shave your face/neck. Please follow these instructions carefully:  1.  Shower with CHG Soap the night before surgery and the  morning of Surgery.  2.  If you choose to wash your hair, wash your hair first as usual with your  normal  shampoo.  3.  After you shampoo, rinse your hair and body thoroughly to remove the  shampoo.  4.  Use CHG as you would any other liquid soap.  You can apply chg directly  to the skin and wash                       Gently with a scrungie or clean washcloth.  5.  Apply the CHG Soap to your body ONLY FROM THE  NECK DOWN.   Do not use on face/ open                           Wound or open sores. Avoid contact with eyes, ears mouth and genitals (private parts).                       Wash face,  Genitals (private parts) with your normal soap.             6.  Wash thoroughly, paying special attention to the area where your surgery  will be performed.  7.  Thoroughly rinse your body with warm water from the neck down.  8.  DO NOT shower/wash with your normal soap after using and rinsing off  the CHG Soap.                9.  Pat yourself dry with a clean towel.            10.  Wear clean pajamas.            11.  Place clean sheets on your bed the night of your first shower and do not  sleep with pets. Day of Surgery : Do not apply any lotions/deodorants the morning of surgery.  Please wear clean clothes to the hospital/surgery center.  FAILURE TO FOLLOW THESE INSTRUCTIONS MAY RESULT IN THE CANCELLATION OF YOUR SURGERY PATIENT SIGNATURE_________________________________  NURSE SIGNATURE__________________________________  ________________________________________________________________________

## 2017-11-24 NOTE — Progress Notes (Signed)
10-22-17 Pulmonary clearance from Lovey Newcomer, NP on chart  11-05-17 CT w/Contrast

## 2017-11-25 ENCOUNTER — Encounter (HOSPITAL_COMMUNITY): Payer: Self-pay

## 2017-11-25 ENCOUNTER — Other Ambulatory Visit: Payer: Self-pay

## 2017-11-25 ENCOUNTER — Encounter (HOSPITAL_COMMUNITY)
Admission: RE | Admit: 2017-11-25 | Discharge: 2017-11-25 | Disposition: A | Discharge status: Home / Self Care | Payer: PPO | Source: Ambulatory Visit | Attending: Urology | Admitting: Urology

## 2017-11-25 DIAGNOSIS — Z01810 Encounter for preprocedural cardiovascular examination: Secondary | ICD-10-CM | POA: Diagnosis not present

## 2017-11-25 DIAGNOSIS — Z01812 Encounter for preprocedural laboratory examination: Secondary | ICD-10-CM | POA: Diagnosis not present

## 2017-11-25 DIAGNOSIS — I10 Essential (primary) hypertension: Secondary | ICD-10-CM | POA: Insufficient documentation

## 2017-11-25 HISTORY — DX: Essential (primary) hypertension: I10

## 2017-11-25 HISTORY — DX: Anxiety disorder, unspecified: F41.9

## 2017-11-25 HISTORY — DX: Unspecified asthma, uncomplicated: J45.909

## 2017-11-25 HISTORY — DX: Chronic obstructive pulmonary disease, unspecified: J44.9

## 2017-11-25 HISTORY — DX: Gastro-esophageal reflux disease without esophagitis: K21.9

## 2017-11-25 LAB — CBC
HCT: 47.8 % — ABNORMAL HIGH (ref 36.0–46.0)
Hemoglobin: 15.8 g/dL — ABNORMAL HIGH (ref 12.0–15.0)
MCH: 29.9 pg (ref 26.0–34.0)
MCHC: 33.1 g/dL (ref 30.0–36.0)
MCV: 90.4 fL (ref 78.0–100.0)
PLATELETS: 192 10*3/uL (ref 150–400)
RBC: 5.29 MIL/uL — ABNORMAL HIGH (ref 3.87–5.11)
RDW: 12.9 % (ref 11.5–15.5)
WBC: 6.1 10*3/uL (ref 4.0–10.5)

## 2017-11-25 LAB — BASIC METABOLIC PANEL
Anion gap: 6 (ref 5–15)
BUN: 19 mg/dL (ref 8–23)
CALCIUM: 9.8 mg/dL (ref 8.9–10.3)
CHLORIDE: 101 mmol/L (ref 98–111)
CO2: 35 mmol/L — ABNORMAL HIGH (ref 22–32)
CREATININE: 0.82 mg/dL (ref 0.44–1.00)
Glucose, Bld: 93 mg/dL (ref 70–99)
Potassium: 4.4 mmol/L (ref 3.5–5.1)
SODIUM: 142 mmol/L (ref 135–145)

## 2017-11-27 NOTE — Progress Notes (Signed)
[facility] for orders. Nurse will send task to Dr. Gloriann Loan.

## 2017-11-29 DIAGNOSIS — J449 Chronic obstructive pulmonary disease, unspecified: Secondary | ICD-10-CM | POA: Diagnosis not present

## 2017-11-30 ENCOUNTER — Encounter (HOSPITAL_COMMUNITY): Payer: Self-pay

## 2017-11-30 ENCOUNTER — Encounter (HOSPITAL_COMMUNITY)
Admission: RE | Disposition: A | Discharge status: Home / Self Care | Payer: Self-pay | Source: Ambulatory Visit | Attending: Urology

## 2017-11-30 ENCOUNTER — Ambulatory Visit (HOSPITAL_COMMUNITY): Payer: PPO

## 2017-11-30 ENCOUNTER — Ambulatory Visit (HOSPITAL_COMMUNITY): Payer: PPO | Admitting: Certified Registered"

## 2017-11-30 ENCOUNTER — Other Ambulatory Visit: Payer: Self-pay | Admitting: Urology

## 2017-11-30 ENCOUNTER — Observation Stay (HOSPITAL_COMMUNITY)
Admission: RE | Admit: 2017-11-30 | Discharge: 2017-11-30 | Disposition: A | Discharge status: Home / Self Care | Payer: PPO | Source: Ambulatory Visit | Attending: Urology | Admitting: Urology

## 2017-11-30 DIAGNOSIS — N200 Calculus of kidney: Secondary | ICD-10-CM | POA: Diagnosis not present

## 2017-11-30 DIAGNOSIS — J45909 Unspecified asthma, uncomplicated: Secondary | ICD-10-CM | POA: Diagnosis not present

## 2017-11-30 DIAGNOSIS — I658 Occlusion and stenosis of other precerebral arteries: Secondary | ICD-10-CM | POA: Diagnosis not present

## 2017-11-30 DIAGNOSIS — J449 Chronic obstructive pulmonary disease, unspecified: Secondary | ICD-10-CM | POA: Insufficient documentation

## 2017-11-30 DIAGNOSIS — Z888 Allergy status to other drugs, medicaments and biological substances status: Secondary | ICD-10-CM | POA: Insufficient documentation

## 2017-11-30 DIAGNOSIS — I10 Essential (primary) hypertension: Secondary | ICD-10-CM | POA: Insufficient documentation

## 2017-11-30 DIAGNOSIS — N201 Calculus of ureter: Secondary | ICD-10-CM | POA: Diagnosis not present

## 2017-11-30 DIAGNOSIS — E7800 Pure hypercholesterolemia, unspecified: Secondary | ICD-10-CM | POA: Diagnosis not present

## 2017-11-30 DIAGNOSIS — K219 Gastro-esophageal reflux disease without esophagitis: Secondary | ICD-10-CM | POA: Diagnosis not present

## 2017-11-30 DIAGNOSIS — R310 Gross hematuria: Secondary | ICD-10-CM | POA: Insufficient documentation

## 2017-11-30 DIAGNOSIS — Z79899 Other long term (current) drug therapy: Secondary | ICD-10-CM | POA: Diagnosis not present

## 2017-11-30 DIAGNOSIS — N202 Calculus of kidney with calculus of ureter: Secondary | ICD-10-CM | POA: Diagnosis not present

## 2017-11-30 HISTORY — PX: CYSTOSCOPY WITH RETROGRADE PYELOGRAM, URETEROSCOPY AND STENT PLACEMENT: SHX5789

## 2017-11-30 SURGERY — CYSTOURETEROSCOPY, WITH RETROGRADE PYELOGRAM AND STENT INSERTION
Anesthesia: General

## 2017-11-30 MED ORDER — MEPERIDINE HCL 50 MG/ML IJ SOLN: 6.2500 mg | INTRAMUSCULAR | Status: DC | PRN

## 2017-11-30 MED ORDER — PROPOFOL 10 MG/ML IV BOLUS
INTRAVENOUS | Status: AC
  Filled 2017-11-30: qty 20

## 2017-11-30 MED ORDER — CIPROFLOXACIN IN D5W 400 MG/200ML IV SOLN
400.0000 mg | Freq: Once | INTRAVENOUS | Status: AC
  Administered 2017-11-30: 400 mg via INTRAVENOUS
  Filled 2017-11-30: qty 200

## 2017-11-30 MED ORDER — SODIUM CHLORIDE 0.9 % IR SOLN
Status: DC | PRN
  Administered 2017-11-30: 3000 mL via INTRAVESICAL

## 2017-11-30 MED ORDER — 0.9 % SODIUM CHLORIDE (POUR BTL) OPTIME
TOPICAL | Status: DC | PRN
  Administered 2017-11-30: 1000 mL

## 2017-11-30 MED ORDER — MIDAZOLAM HCL 2 MG/2ML IJ SOLN
INTRAMUSCULAR | Status: AC
  Filled 2017-11-30: qty 2

## 2017-11-30 MED ORDER — IOHEXOL 300 MG/ML  SOLN
INTRAMUSCULAR | Status: DC | PRN
  Administered 2017-11-30: 20 mL via URETHRAL

## 2017-11-30 MED ORDER — LIDOCAINE 2% (20 MG/ML) 5 ML SYRINGE
INTRAMUSCULAR | Status: DC | PRN
  Administered 2017-11-30: 50 mg via INTRAVENOUS

## 2017-11-30 MED ORDER — ONDANSETRON HCL 4 MG/2ML IJ SOLN: 4.0000 mg | Freq: Once | INTRAMUSCULAR | Status: DC | PRN

## 2017-11-30 MED ORDER — DEXAMETHASONE SODIUM PHOSPHATE 4 MG/ML IJ SOLN
INTRAMUSCULAR | Status: DC | PRN
  Administered 2017-11-30: 4 mg via INTRAVENOUS

## 2017-11-30 MED ORDER — HYDROMORPHONE HCL 1 MG/ML IJ SOLN: 0.2500 mg | INTRAMUSCULAR | Status: DC | PRN

## 2017-11-30 MED ORDER — FENTANYL CITRATE (PF) 100 MCG/2ML IJ SOLN
INTRAMUSCULAR | Status: AC
  Filled 2017-11-30: qty 2

## 2017-11-30 MED ORDER — MIDAZOLAM HCL 2 MG/2ML IJ SOLN
INTRAMUSCULAR | Status: DC | PRN
  Administered 2017-11-30: 2 mg via INTRAVENOUS

## 2017-11-30 MED ORDER — LACTATED RINGERS IV SOLN
INTRAVENOUS | Status: DC
  Administered 2017-11-30: 13:00:00 via INTRAVENOUS

## 2017-11-30 MED ORDER — ONDANSETRON HCL 4 MG/2ML IJ SOLN
INTRAMUSCULAR | Status: DC | PRN
  Administered 2017-11-30: 4 mg via INTRAVENOUS

## 2017-11-30 MED ORDER — TRAMADOL HCL 50 MG PO TABS: 50.0000 mg | ORAL_TABLET | Freq: Four times a day (QID) | ORAL | 0 refills | Status: DC | PRN

## 2017-11-30 MED ORDER — FENTANYL CITRATE (PF) 100 MCG/2ML IJ SOLN
INTRAMUSCULAR | Status: DC | PRN
  Administered 2017-11-30 (×2): 50 ug via INTRAVENOUS

## 2017-11-30 MED ORDER — PROPOFOL 10 MG/ML IV BOLUS
INTRAVENOUS | Status: DC | PRN
  Administered 2017-11-30: 150 mg via INTRAVENOUS

## 2017-11-30 MED ORDER — PHENYLEPHRINE 40 MCG/ML (10ML) SYRINGE FOR IV PUSH (FOR BLOOD PRESSURE SUPPORT)
PREFILLED_SYRINGE | INTRAVENOUS | Status: DC | PRN
  Administered 2017-11-30 (×3): 80 ug via INTRAVENOUS

## 2017-11-30 SURGICAL SUPPLY — 30 items
BAG URINE DRAINAGE (UROLOGICAL SUPPLIES) IMPLANT
BAG URO CATCHER STRL LF (MISCELLANEOUS) ×2 IMPLANT
BASKET LASER NITINOL 1.9FR (BASKET) IMPLANT
BASKET ZERO TIP NITINOL 2.4FR (BASKET) IMPLANT
CATH INTERMIT  6FR 70CM (CATHETERS) ×2 IMPLANT
CATH URET 5FR 28IN CONE TIP (BALLOONS)
CATH URET 5FR 70CM CONE TIP (BALLOONS) IMPLANT
CLOTH BEACON ORANGE TIMEOUT ST (SAFETY) IMPLANT
COVER FOOTSWITCH UNIV (MISCELLANEOUS) ×2 IMPLANT
ELECT REM PT RETURN 15FT ADLT (MISCELLANEOUS) IMPLANT
EXTRACTOR STONE 1.7FRX115CM (UROLOGICAL SUPPLIES) IMPLANT
FIBER LASER FLEXIVA 365 (UROLOGICAL SUPPLIES) IMPLANT
FIBER LASER TRAC TIP (UROLOGICAL SUPPLIES) ×2 IMPLANT
GLOVE BIO SURGEON STRL SZ7.5 (GLOVE) IMPLANT
GLOVE SURG SS PI 7.5 STRL IVOR (GLOVE) ×2 IMPLANT
GOWN STRL REUS W/TWL LRG LVL3 (GOWN DISPOSABLE) IMPLANT
GOWN STRL REUS W/TWL XL LVL3 (GOWN DISPOSABLE) ×2 IMPLANT
GUIDEWIRE ANG ZIPWIRE 038X150 (WIRE) IMPLANT
GUIDEWIRE STR DUAL SENSOR (WIRE) ×4 IMPLANT
INFUSOR MANOMETER BAG 3000ML (MISCELLANEOUS) IMPLANT
LOOP CUT BIPOLAR 24F LRG (ELECTROSURGICAL) IMPLANT
MANIFOLD NEPTUNE II (INSTRUMENTS) ×2 IMPLANT
PACK CYSTO (CUSTOM PROCEDURE TRAY) ×2 IMPLANT
SET ASPIRATION TUBING (TUBING) IMPLANT
SHEATH URETERAL 12FRX28CM (UROLOGICAL SUPPLIES) IMPLANT
SHEATH URETERAL 12FRX35CM (MISCELLANEOUS) ×2 IMPLANT
STENT URET 6FRX24 CONTOUR (STENTS) ×2 IMPLANT
SYRINGE IRR TOOMEY STRL 70CC (SYRINGE) IMPLANT
TUBING CONNECTING 10 (TUBING) ×2 IMPLANT
TUBING UROLOGY SET (TUBING) ×2 IMPLANT

## 2017-11-30 NOTE — Transfer of Care (Signed)
Immediate Anesthesia Transfer of Care Note  Patient: Brandy Morales  Procedure(s) Performed: CYSTOSCOPY WITH BILATERAL RETROGRADE PYELOGRAM, LEFT URETEROSCOPY/ LEFT LASER LITHO AND LEFT STENT PLACEMENT (N/A )  Patient Location: PACU  Anesthesia Type:General  Level of Consciousness: awake, alert , oriented and patient cooperative  Airway & Oxygen Therapy: Patient Spontanous Breathing and Patient connected to face mask  Post-op Assessment: Report given to RN and Post -op Vital signs reviewed and stable  Post vital signs: Reviewed and stable  Last Vitals:  Vitals Value Taken Time  BP    Temp    Pulse    Resp    SpO2      Last Pain:  Vitals:   11/30/17 1218  TempSrc:   PainSc: 0-No pain         Complications: No apparent anesthesia complications

## 2017-11-30 NOTE — Anesthesia Preprocedure Evaluation (Addendum)
Anesthesia Evaluation  Patient identified by MRN, date of birth, ID band Patient awake    Reviewed: Allergy & Precautions, NPO status   Airway Mallampati: I  TM Distance: >3 FB Neck ROM: Full    Dental   Pulmonary asthma , COPD, former smoker,    Pulmonary exam normal        Cardiovascular hypertension, Pt. on medications Normal cardiovascular exam     Neuro/Psych Anxiety    GI/Hepatic GERD  Medicated and Controlled,  Endo/Other    Renal/GU      Musculoskeletal   Abdominal   Peds  Hematology   Anesthesia Other Findings   Reproductive/Obstetrics                            Anesthesia Physical Anesthesia Plan  ASA: III  Anesthesia Plan: General   Post-op Pain Management:    Induction: Intravenous  PONV Risk Score and Plan: 3 and Ondansetron, Dexamethasone and Midazolam  Airway Management Planned: LMA  Additional Equipment:   Intra-op Plan:   Post-operative Plan: Extubation in OR  Informed Consent: I have reviewed the patients History and Physical, chart, labs and discussed the procedure including the risks, benefits and alternatives for the proposed anesthesia with the patient or authorized representative who has indicated his/her understanding and acceptance.     Plan Discussed with: CRNA and Surgeon  Anesthesia Plan Comments:         Anesthesia Quick Evaluation

## 2017-11-30 NOTE — Anesthesia Procedure Notes (Signed)
Procedure Name: LMA Insertion Date/Time: 11/30/2017 1:48 PM Performed by: Pilar Grammes, CRNA Pre-anesthesia Checklist: Patient identified Patient Re-evaluated:Patient Re-evaluated prior to induction Oxygen Delivery Method: Circle system utilized Preoxygenation: Pre-oxygenation with 100% oxygen Induction Type: IV induction Ventilation: Mask ventilation without difficulty LMA: LMA inserted and LMA flexible inserted LMA Size: 4.0 Number of attempts: 1

## 2017-11-30 NOTE — Anesthesia Postprocedure Evaluation (Signed)
Anesthesia Post Note  Patient: Brandy Morales  Procedure(s) Performed: CYSTOSCOPY WITH BILATERAL RETROGRADE PYELOGRAM, LEFT URETEROSCOPY/ LEFT LASER LITHO AND LEFT STENT PLACEMENT (N/A )     Patient location during evaluation: PACU Anesthesia Type: General Level of consciousness: awake and alert Pain management: pain level controlled Vital Signs Assessment: post-procedure vital signs reviewed and stable Respiratory status: spontaneous breathing, nonlabored ventilation, respiratory function stable and patient connected to nasal cannula oxygen Cardiovascular status: blood pressure returned to baseline and stable Postop Assessment: no apparent nausea or vomiting Anesthetic complications: no    Last Vitals:  Vitals:   11/30/17 1530 11/30/17 1600  BP: (!) 152/91   Pulse: 71   Resp: 15 15  Temp: 36.7 C 36.7 C  SpO2: 100%     Last Pain:  Vitals:   11/30/17 1441  TempSrc:   PainSc: 0-No pain                 Tonetta Napoles DAVID

## 2017-11-30 NOTE — H&P (Signed)
CC: Problems/Symptoms  HPI: Brandy Morales is a 72 year-old female established patient who is here related to problem/symptoms noted below.  April 2019 she first stated noticing pain on 05/11/2018. This is not her first kidney stone. She has had more than 5 stones prior to getting this one. She is not currently having flank pain, back pain, groin pain, nausea, vomiting, fever or chills. She has not caught a stone in her urine strainer since her symptoms began.   The patient has been followed since December for a left ureteral calculus. It is still yet passed.The patient does have a history of nephrolithiasis. She has intermittent left-sided flank pain. It is not severe in nature. She has also been experiencing gross hematuria for the past week with associated dysuria. No other complaints. No fevers, chills, nausea, vomiting. She is on supplemental oxygen for history of pulmonary disease. She states that she recently saw her pulmonologist who told her that she would be fine for general anesthesia.   11/24/17: Voices no complaints at this time. Wears continuous Oxygen.   Her presenting problem is Seen today for a preop visit. Scheduled 11/30/17 for cystoscopy, (B) RPG, urethroscopy, TURBT.     ALLERGIES: Latex - Skin Rash Statins - Other Reaction, cough    MEDICATIONS: Omeprazole 10 mg capsule,delayed release 1 capsule PO Daily  Acyclovir 400 mg tablet 1 po 5 x/day  Artificial Tears 1 drop Daily  Breo Ellipta 200 mcg-25 mcg/dose blister, with inhalation device 1 inhaler Puff Daily  Citalopram Hbr 20 mg tablet 1 tablet PO Daily  Coq-10 30 mg capsule 1 capsule PO Daily  Fexofenadine Hcl 180 mg tablet 1 tablet PO Daily  Fluticasone Propionate 50 mcg/actuation spray, suspension 2 ml Spray Daily  Levalbuterol Hcl 1.25 mg/3 ml vial, nebulizer 1 inhaler PO Q 4 H PRN  Losartan Potassium 100 mg tablet 1 tablet PO Daily  Multi-Day Plus Minerals 1 tablet PO Daily  Naproxen Sodium 220 mg tablet 1 tablet PO  Daily  Oxygen 1 PO Daily  Proventil Hfa 90 mcg hfa aerosol with adapter  Smarty Pants 6 tablet PO Daily  Spiriva 18 mcg capsule, with inhalation device     GU PSH: Catheterization For Collection Of Specimen, Single Patient, All Places Of Service - 05/15/2017 Hysterectomy    NON-GU PSH: None   GU PMH: Acute Cystitis/UTI - 08/28/2017 Gross hematuria - 08/28/2017 Ureteral calculus - 08/28/2017, (Chronic), Left, No defintive left ureteral calculus noted on KUB and no hydro on RUS. She will f/u in 6 weeks for recheck. If no change in imaging than most likely she passed stone. If she has any changes and no definitive stone seen on KUB will need repeat CT urogram , - 06/05/2017 (Acute), Left, Will proceed at this time with MET. Begin Tamsulosin 0.4 mg 1 po daily. Hydrocodone 5/325 mg 1 po Q8 hrs prn pain. F/U in 2 weeks for OV/KUB/RUS. Instructed to contact office if she has any acute changes ie temp >100.5, intractable pain, or vomiting. Given strainer. If stone captured instructed to bring to office for analysis, - 05/15/2017 Ureteral obstruction secondary to calculous (Acute), Left, Secondary to left proximal ureteral stone. 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 Has not smoked since 04/26/2015. Smoked for 40 years. Smoked 2 packs per day.    Tobacco Use Assessment Completed: Used Tobacco in last 30 days? Does not use smokeless tobacco. Drinks 1 drink per week.  Drinks 4+ caffeinated drinks per day. Patient's occupation Astronomer.     Notes: 2 adopted children   REVIEW OF SYSTEMS:    GU Review Female:   Patient denies frequent urination, hard to postpone urination, burning /pain with urination, get up at night to urinate, leakage of urine, stream starts and stops, trouble starting your stream, have to strain to urinate, and being pregnant.  Gastrointestinal (Upper):   Patient denies nausea, vomiting, and indigestion/ heartburn.  Gastrointestinal (Lower):   Patient denies diarrhea and constipation.  Constitutional:   Patient denies fever, night sweats, weight loss, and fatigue.  Skin:   Patient denies skin rash/ lesion and itching.  Eyes:   Patient denies blurred vision and double vision.  Ears/ Nose/ Throat:   Patient denies sore throat and sinus problems.  Hematologic/Lymphatic:   Patient denies easy bruising and swollen glands.  Cardiovascular:   Patient denies leg swelling and chest pains.  Respiratory:   Patient denies cough and shortness of breath.  Endocrine:   Patient denies excessive thirst.  Musculoskeletal:   Patient denies back pain and joint pain.  Neurological:   Patient denies headaches and dizziness.  Psychologic:   Patient denies depression and anxiety.   VITAL SIGNS:      11/24/2017 10:09 AM  Weight 177 lb / 80.29 kg  Height 64 in / 162.56 cm  BP 123/85 mmHg  Pulse 76 /min  Temperature 97.5 F / 36.3 C  BMI 30.4 kg/m   MULTI-SYSTEM PHYSICAL EXAMINATION:    Constitutional: Well-nourished. No physical deformities. Normally developed. Good grooming.   Neck: Neck symmetrical, not swollen. Normal tracheal position.   Respiratory: Normal breath sounds. No labored breathing, no use of accessory muscles. Wears continuous oxygen.   Cardiovascular: Regular rate and rhythm. No murmur, no  gallop. Normal temperature, normal extremity pulses, no swelling, no varicosities.   Lymphatic: No enlargement, no tenderness of neck lymph nodes.  Skin: No paleness, no jaundice, no cyanosis. No lesion, no ulcer, no rash.   Neurologic / Psychiatric: Oriented to time, oriented to place, oriented to person. No depression, no anxiety, no agitation.   Gastrointestinal: No mass, no tenderness, no rigidity, non obese abdomen.   Eyes: Normal conjunctivae. Normal eyelids.   Ears, Nose, Mouth, and Throat: Left ear no scars, no lesions, no masses. Right ear no scars, no lesions, no masses. Nose no scars, no lesions, no masses. Normal hearing. Normal lips.   Musculoskeletal: Normal gait and station of head and neck.     PAST DATA REVIEWED:  Source Of History:  Patient  Records Review:   Previous Patient Records  Urine Test Review:   Urinalysis, Urine Culture  Notes:                     08/28/17 urine culture: no growth   PROCEDURES:         KUB - 74018  A single view of the abdomen is obtained.      Prominent bowel/gas noted. Left proximal ureteral stone  still at L2-3. Stable renal pelvic calcifications.          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   2   Ureteral calculus - N20.1 Left, Stable, Chronic - Cleared for upcoming surgical procedure.    PLAN:             Medications Stop Meds: Probiotic  Discontinue: 11/24/2017  - Reason: The medication cycle was completed.  Vitamin B12 1 tablet PO Daily  Discontinue: 11/24/2017  - Reason: The medication cycle was completed.  Vitamin C 100 mg tablet 1 tablet PO Daily  Discontinue: 11/24/2017  - Reason: The medication cycle was completed.  Vitamin D3  Discontinue: 11/24/2017  - Reason: The medication cycle was completed.            Orders Labs Urine Culture  X-Rays: KUB          Schedule Return Visit/Planned Activity: Keep Scheduled Appointment          Document Letter(s):  Created for Patient: Clinical Summary         Notes:   Culture urine. She understands to contact office if she has any acute changes intractable pain, temp >100.5, CP, SOB, or vomiting. At this time she is cleared for up coming surgical procedure with Dr. Gloriann Loan.         Next Appointment:      Next Appointment: 11/30/2017 01:30 PM    Appointment Type: Surgery     Location: [facility] [phone] [id number]    Provider: Link Snuffer, III, M.D.    Reason for Visit: OP WL CYSTO BIL RGP URS LL STENT POSS TUR BT    Signed by Jimmey Ralph on 11/24/17 at 10:51 AM (EDT

## 2017-11-30 NOTE — Discharge Summary (Signed)
Physician Discharge Summary  Patient ID: Brandy Morales MRN: [id number] DOB/AGE: [birth year] 72 y.o.  Admit date: 11/30/2017 Discharge date: 11/30/2017  Admission Diagnoses:  Discharge Diagnoses:  Active Problems:   Renal calculi   Discharged Condition: good  Hospital Course: underwent left URS/LL. Discharged after  Consults: None  Significant Diagnostic Studies: none  Treatments: surgery: as above  Discharge Exam: Blood pressure (!) 152/91, pulse 71, temperature 98 F (36.7 C), resp. rate 15, height 5\' 4"  (1.626 m), weight 80.5 kg (177 lb 6 oz), SpO2 100 %. Head: Normocephalic, without obvious abnormality, atraumatic  Disposition:    Allergies as of 11/30/2017      Reactions   Statins Cough   Latex Other (See Comments)   Broke out on face      Medication List    TAKE these medications   acyclovir 400 MG tablet Commonly known as:  ZOVIRAX Take 400 mg by mouth 3 (three) times daily as needed (for outbreak).   albuterol 108 (90 Base) MCG/ACT inhaler Commonly known as:  PROVENTIL HFA;VENTOLIN HFA Inhale 2 puffs into the lungs every 6 (six) hours as needed for wheezing or shortness of breath.   BREO ELLIPTA 200-25 MCG/INH Aepb Generic drug:  fluticasone furoate-vilanterol Inhale 1 puff into the lungs at bedtime.   citalopram 20 MG tablet Commonly known as:  CELEXA Take 20 mg by mouth daily.   COENZYME Q10 PO Take 10 mLs by mouth daily.   fexofenadine 180 MG tablet Commonly known as:  ALLEGRA Take 180 mg by mouth daily.   fluticasone 50 MCG/ACT nasal spray Commonly known as:  FLONASE Place 1 spray into both nostrils daily.   GENTEAL TEARS OP Place 1 drop into both eyes daily.   levalbuterol 1.25 MG/0.5ML nebulizer solution Commonly known as:  XOPENEX Take 1.25 mg by nebulization every 4 (four) hours as needed for wheezing or shortness of breath.   losartan-hydrochlorothiazide 100-25 MG tablet Commonly known as:  HYZAAR Take 0.5 tablets by mouth  daily.   MULTIVITAMIN PO Take 1 tablet by mouth daily.   naproxen sodium 220 MG tablet Commonly known as:  ALEVE Take 220 mg by mouth daily.   omeprazole 20 MG capsule Commonly known as:  PRILOSEC Take 20 mg by mouth daily.   OXYGEN Inhale 2 L into the lungs continuous.   tiotropium 18 MCG inhalation capsule Commonly known as:  SPIRIVA HANDIHALER Place 1 capsule (18 mcg total) into inhaler and inhale daily.   traMADol 50 MG tablet Commonly known as:  ULTRAM Take 1 tablet (50 mg total) by mouth every 6 (six) hours as needed.   Vitamin D3 5000 units Caps Take 5,000 Units by mouth daily.        Signed: Marton Redwood, III 11/30/2017, 10:09 PM

## 2017-11-30 NOTE — Op Note (Signed)
Operative Note   Preoperative diagnosis:  1.  Left renal and ureteral calculus 2.  Gross hematuria  Postoperative diagnosis: 1.  Left renal calculus 2.  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  Gross hematuria  Procedure(s): 1.  Cystoscopy with bilateral retrograde pyelogram, left ureteroscopy with laser lithotripsy, left ureteral stent placement  Surgeon: Link Snuffer, MD  Assistants: None  Anesthesia: General  Complications: None immediate  EBL: Minimal  Specimens: 1.  None  Drains/Catheters: 1.  6 x 24 double-J ureteral stent  Intraoperative findings: 1.  Normal urethra and bladder.  No evidence of any tumor or stone on the bladder mucosa. 2.  Right retrograde pyelogram revealed no evidence of any filling defect or hydronephrosis.  It was normal. 3.  No evidence of ureteral calculi.  There is a about 6 or 7 mm left renal calculus that was fragmented to less than 1 mm fragments.  Left retrograde pyelogram revealed evidence of ureteral narrowing at the proximal ureter with poor passage of contrast and upstream hydronephrosis. 4.  Diagnostic ureteroscopy along the ureter revealed no evidence of stones.  There was an area of narrowing corresponding to the area seen on retrograde pyelogram there was smooth and easily passed with the scope indicating possible UPJ obstruction secondary to a crossing vessel.  There was no dense stricture.                                                                                                                                                                              Indication: 72 year old female with a long-standing history of left sided flank pain that was found to have a left ureteral calculus that likely had not passed.  Given this finding, decision was made to proceed with the above operation.  Description of procedure:  The patient was identified and consent was obtained.  The patient  was taken to the operating room and placed in the supine position.  The patient was placed under general anesthesia.  Perioperative antibiotics were administered.  The patient was placed in dorsal lithotomy.  Patient was prepped and draped in a standard sterile fashion and a timeout was performed.  A 21 French rigid cystoscope was advanced into the urethra and into the bladder.  Complete cystoscopy was performed with no abnormal findings.  The right distal ureter was cannulated with an open-ended ureteral catheter and a retrograde pyelogram was performed.  There were no abnormal findings on the side.  I then turned the attention to the left ureter and advanced a sensor wire up to the kidney under fluoroscopic guidance.  I then advanced a semirigid ureteroscope alongside the wire which passed easily.  I encountered an  area of narrowing and the scope easily passed by this but there was no stone identified.  I withdrew the scope just to the area distal to the ureteral narrowing and shot a retrograde pyelogram through the scope with the findings noted above.  There was obvious hydronephrosis and poor passage of contrast past this area of narrowing even though it was easy to pass the scope.  I advanced a second wire through the scope and into the kidney and withdrew the scope.  I then advanced a 12 x 14 access sheath over the wire under continuous fluoroscopic guidance.  I withdrew the inner sheath along with the wire and advanced a flexible ureteroscope into the kidney.  Again it passed by this area of ureteral narrowing without any issue.  I was also able to navigate the sheath past this area as well.  I performed a complete pyeloscopy and identified a stone of interest which was fragmented to less than 1 mm fragments.  I did not identify any other obvious stones.  I therefore withdrew the scope along with the access sheath and did not identify any obvious trauma or ureteral calculi.  I backloaded the wire onto a  ureteroscope and advanced that into the bladder.  I then placed a 6 x 24 double-J ureteral stent in a standard fashion followed by removal of the wire.  Fluoroscopy confirmed proximal placement and direct visualization confirmed a good coil within the bladder.  I drained the bladder and withdrew the scope and this concluded the operation.  The patient tolerated the procedure well and was stable postoperatively.  Plan: Return in 1 week for ureteral stent removal.  I will plan to obtain a renal ultrasound in 4 to 6 weeks.  If hydronephrosis persists, I will plan to get a renal scan with Lasix as well as a CTA of the abdomen to see if there is a crossing vessel.

## 2017-11-30 NOTE — Discharge Instructions (Signed)

## 2017-11-30 NOTE — Progress Notes (Signed)
Dr Gloriann Loan talked with patient.  Dr Conrad Yale at bedside and talked with patient as well and patient would like to go home.  Daughter plans on staying with daughter.  Patient to be discharged home.

## 2017-12-01 ENCOUNTER — Encounter (HOSPITAL_COMMUNITY): Payer: Self-pay | Admitting: Urology

## 2017-12-01 NOTE — Addendum Note (Signed)
Addendum  created 12/01/17 0756 by Lollie Sails, CRNA   Charge Capture section accepted

## 2017-12-09 DIAGNOSIS — N200 Calculus of kidney: Secondary | ICD-10-CM | POA: Diagnosis not present

## 2017-12-11 DIAGNOSIS — R1084 Generalized abdominal pain: Secondary | ICD-10-CM | POA: Diagnosis not present

## 2017-12-11 DIAGNOSIS — N130 Hydronephrosis with ureteropelvic junction obstruction: Secondary | ICD-10-CM | POA: Diagnosis not present

## 2017-12-30 DIAGNOSIS — J449 Chronic obstructive pulmonary disease, unspecified: Secondary | ICD-10-CM | POA: Diagnosis not present

## 2018-01-12 ENCOUNTER — Ambulatory Visit: Payer: PPO | Admitting: Internal Medicine

## 2018-01-14 ENCOUNTER — Encounter: Payer: Self-pay | Admitting: Internal Medicine

## 2018-01-14 ENCOUNTER — Ambulatory Visit: Payer: PPO | Admitting: Internal Medicine

## 2018-01-14 VITALS — BP 132/70 | HR 105 | Ht 64.0 in | Wt 180.4 lb

## 2018-01-14 DIAGNOSIS — J9611 Chronic respiratory failure with hypoxia: Secondary | ICD-10-CM

## 2018-01-14 DIAGNOSIS — R918 Other nonspecific abnormal finding of lung field: Secondary | ICD-10-CM | POA: Diagnosis not present

## 2018-01-14 DIAGNOSIS — J449 Chronic obstructive pulmonary disease, unspecified: Secondary | ICD-10-CM

## 2018-01-14 DIAGNOSIS — J439 Emphysema, unspecified: Secondary | ICD-10-CM

## 2018-01-14 MED ORDER — FLUTICASONE-UMECLIDIN-VILANT 100-62.5-25 MCG/INH IN AEPB: 1.0000 | INHALATION_SPRAY | Freq: Every morning | RESPIRATORY_TRACT | 6 refills | Status: DC

## 2018-01-14 NOTE — Progress Notes (Signed)
Subjective:     Patient ID: Brandy Morales, female   DOB: 03/17/1946, 72 y.o.   MRN: [id number]  HPI    IOV 03/18/2017  Chief Complaint  Patient presents with  . Pulm Consult    Pt referred by Dr. Inda Merlin MD for respiratory failure. Pt has SOB and wheezing with exertion. Pt on 2 liters on O2 [facility], been on O2 for two years. Pt fingers stays purple color and warm and feet stay cold and turn purple.     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 Dr. Inda Merlin is a primary care physician. She tells me that for the last 2 years she's been on chronic oxygen therapy. She uses the oxygen only on demand in the daytime based on subjective symptoms of dyspnea on exertion relieved by rest. Oxygen definitely helps her. She occasionally monitors her pulse ox and it would dip to 86% with exertion. She does not use the oxygen at night because she does not feel dyspneic atight. Is only occasional cough or wheezing. She has a previous 110 pack smoking history. She is not sure of the diagnosis COPD of pulmonary fibrosis but she reckons that his COPD based on the smoking history and previous history of wheezing and based on some other physician comments. Overall she's stable but at this point she wanted to see a pulmonologist there for she's been referred. Previously she was not interested. She does not recollect any pulmonary function tests a CT scan of the chest. She recollects that office chest x-ray to be abnormal. She is not on any inhaler therapy and this is because the oxygen therapy helped her.  In addition she does give a history of RaynaudThere is no prior history of autoimmune disease. Denies any pulmonary fibrosis history  Walking desaturation test 185 feet 3 laps on room air with a full head probe : Resting heart the resting pulse ox was 96% with a heart rate of 81/m.she walk with a full head probe. By the second lap she started wheezing  significantly. Breath and became short of breath. And tok a break , and at the second lap and the pulse ox was 89% with a heart rate of 101. At the third lap pulse ox was 90% with a heart rate of 99. She took 2 breaks. The only mild desaturation.   OV 05/14/2017  Chief Complaint  Patient presents with  . Follow-up    PFT done today, HRCT 03/30/17, PET scan done 12/17.  Pt states she has been doing good since last visit.  DME: [facility], 2L O2 pulse when out and 2L continuous at home.   Fu Raynaud Fu chronic hypoxemic respp failure  Here to discuss test result for above. Autoimmune panel negative. 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 Had 2 nodules - so we did PET Scan a month later in dec 2018 and nodules shrinking. 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 She is asymptomatic from this but has prior hx. OVerall resp status is stable and she is using 2-3L [place].   OV 08/14/2017  Chief Complaint  Patient presents with  . Follow-up    Pt stated she became sick x6 weeks ago and thought it could have been the flu.  Pt states she has had good and bad days. Pt has some congestion, stable SOB. Denies any CP/tightness.    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  Overall she is stable.  Some 6 weeks ago she picked up symptoms of a COPD exacerbation.  Saw primary care physician Dr. Inda Merlin.  Given antibiotics and prednisone but she only took the prednisone.  Did not take the antibiotics.  She is improved but still feels a little bit hoarse in her chest and has some cough that is not back at baseline yet.  COPD CAT score is 18.  At this point in time she is not interested in antibiotic or another course of prednisone and.  She feels she is naturally improving.  She asked about pulmonary rehabilitation but given the inconvenience it poses of parking at the hospital and having to use a wheelchair to get into  rehab at this point in time she wants to hold off.  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  Of note she did see urology for her stones by the nurse practitioner.  She is going to see the primary MD urologist.  She gets the feeling that she might be considered a high risk for surgery given her advanced COPD.   OV 01/14/2018  Chief Complaint  Patient presents with  . Follow-up    Pt states she has been doing well since last visit. Denies any current complaints or concerns.    Brandy Morales - Follow-up of Gold stage IV COPD. She is on Spiriva and Brio and 2 L of oxygen. Overall stable. COPD cat score is unchangedas seen below. She had admission in July for urologic issues but she is over it now. She is trying to walk and get more reconditioned. The heat and humidity is posing a challenge. She is also having difficulty with co-pay of her inhalers.she's not interested in overnight BiPAP and therefore does not want blood gas tested.  Follow-up lung nodules: She had CT scan of the chest June 2019. She has a new groundglass opacity right lower lobe superior segment.    CAT COPD Symptom & Quality of Life Score (GSK trademark) 0 is no burden. 5 is highest burden 08/14/2017  01/14/2018   Never Cough -> Cough all the time 2 1  No phlegm in chest -> Chest is full of phlegm 2 3  No chest tightness -> Chest feels very tight 0 0  No dyspnea for 1 flight stairs/hill -> Very dyspneic for 1 flight of stairs 3 5  No limitations for ADL at home -> Very limited with ADL at home 4 3  Confident leaving home -> Not at all confident leaving home 4 2.5  Sleep soundly -> Do not sleep soundly because of lung condition 4 0  Lots of Energy -> No energy at all 3 4  TOTAL Score (max 40)  18 18.5   Results for CHARIAH, BAILEY (MRN [id number]) as of 08/14/2017 16:57  Ref. Range 05/14/2017 10:30  FEV1-Pre Latest Units: L 0.63  FEV1-%Pred-Pre Latest Units: % 26  Pre FEV1/FVC ratio Latest  Units: % 42  Results for CALIANA, SPIRES (MRN [id number]) as of 08/14/2017 16:57  Ref. Range 05/14/2017 10:30  DLCO cor Latest Units: ml/min/mmHg 9.09  DLCO cor % pred   Latest Units: % 35    IMPRESSION: Resolution of 1 right upper lobe pulmonary nodule, and other 10 mm right upper lobe pulmonary nodule remains stable. New focal ground-glass opacity in the superior segment of the right lower lobe. These findings are suggestive of inflammatory or infectious etiology; continued follow-up by chest CT without  contrast is recommended in 6 months.  Aortic Atherosclerosis (ICD10-I70.0) and Emphysema (ICD10-J43.9). Coronary artery calcification.   Electronically Signed   By: Earle Gell M.D.   On: 11/05/2017 14:05   has a past medical history of Anxiety, Asthma, COPD (chronic obstructive pulmonary disease) ([place]), GERD (gastroesophageal reflux disease), and Hypertension.   reports that she quit smoking about 3 years ago. Her smoking use included cigarettes. She has a 110.00 pack-year smoking history. She has never used smokeless tobacco.  Past Surgical History:  Procedure Laterality Date  . ABDOMINAL HYSTERECTOMY    . APPENDECTOMY    . CYSTOSCOPY WITH RETROGRADE PYELOGRAM, URETEROSCOPY AND STENT PLACEMENT N/A 11/30/2017   Procedure: CYSTOSCOPY WITH BILATERAL RETROGRADE PYELOGRAM, LEFT URETEROSCOPY/ LEFT LASER LITHO AND LEFT STENT PLACEMENT;  Surgeon: Lucas Mallow, MD;  Location: [facility];  Service: Urology;  Laterality: N/A;  . EYE SURGERY Bilateral    Cataract  . TONSILLECTOMY      Allergies  Allergen Reactions  . Statins Cough  . Latex Other (See Comments)    Broke out on face    Immunization History  Administered Date(s) Administered  . Influenza Split 03/03/2016, 03/11/2017  . Pneumococcal Conjugate-13 01/27/2014  . Pneumococcal Polysaccharide-23 07/02/2008    No family history on file.   Current Outpatient Medications:  .  acyclovir (ZOVIRAX) 400 MG tablet,  Take 400 mg by mouth 3 (three) times daily as needed (for outbreak). , Disp: , Rfl:  .  albuterol (PROVENTIL HFA;VENTOLIN HFA) 108 (90 Base) MCG/ACT inhaler, Inhale 2 puffs into the lungs every 6 (six) hours as needed for wheezing or shortness of breath., Disp: 1 Inhaler, Rfl: 6 .  Artificial Tear Solution (GENTEAL TEARS OP), Place 1 drop into both eyes daily. , Disp: , Rfl:  .  Cholecalciferol (VITAMIN D3) 5000 units CAPS, Take 5,000 Units by mouth daily., Disp: , Rfl:  .  citalopram (CELEXA) 20 MG tablet, Take 20 mg by mouth daily., Disp: , Rfl:  .  COENZYME Q10 PO, Take 10 mLs by mouth daily. , Disp: , Rfl:  .  fexofenadine (ALLEGRA) 180 MG tablet, Take 180 mg by mouth daily., Disp: , Rfl:  .  fluticasone (FLONASE) 50 MCG/ACT nasal spray, Place 1 spray into both nostrils daily. , Disp: , Rfl:  .  fluticasone furoate-vilanterol (BREO ELLIPTA) 200-25 MCG/INH AEPB, Inhale 1 puff into the lungs at bedtime. , Disp: , Rfl:  .  levalbuterol (XOPENEX) 1.25 MG/0.5ML nebulizer solution, Take 1.25 mg by nebulization every 4 (four) hours as needed for wheezing or shortness of breath., Disp: , Rfl:  .  losartan-hydrochlorothiazide (HYZAAR) 100-25 MG tablet, Take 0.5 tablets by mouth daily., Disp: , Rfl:  .  Multiple Vitamins-Minerals (MULTIVITAMIN PO), Take 1 tablet by mouth daily., Disp: , Rfl:  .  naproxen sodium (ALEVE) 220 MG tablet, Take 220 mg by mouth daily., Disp: , Rfl:  .  omeprazole (PRILOSEC) 20 MG capsule, Take 20 mg by mouth daily. , Disp: , Rfl:  .  OXYGEN, Inhale 2 L into the lungs continuous. , Disp: , Rfl:  .  tiotropium (SPIRIVA HANDIHALER) 18 MCG inhalation capsule, Place 1 capsule (18 mcg total) into inhaler and inhale daily., Disp: 30 capsule, Rfl: 12   Review of Systems     Objective:   Physical Exam  Constitutional: She is oriented to person, place, and time. She appears well-developed and well-nourished. No distress.  HENT:  Head: Normocephalic and atraumatic.  Right Ear:  External ear normal.  Left  Ear: External ear normal.  Mouth/Throat: Oropharynx is clear and moist. No oropharyngeal exudate.  Eyes: Pupils are equal, round, and reactive to light. Conjunctivae and EOM are normal. Right eye exhibits no discharge. Left eye exhibits no discharge. No scleral icterus.  Neck: Normal range of motion. Neck supple. No JVD present. No tracheal deviation present. No thyromegaly present.  Cardiovascular: Normal rate, regular rhythm, normal heart sounds and intact distal pulses. Exam reveals no gallop and no friction rub.  No murmur heard. Pulmonary/Chest: Effort normal and breath sounds normal. No respiratory distress. She has no wheezes. She has no rales. She exhibits no tenderness.  Barrel chest with purse lip breathing which is baseline  Abdominal: Soft. Bowel sounds are normal. She exhibits no distension and no mass. There is no tenderness. There is no rebound and no guarding.  Musculoskeletal: Normal range of motion. She exhibits no edema or tenderness.  Lymphadenopathy:    She has no cervical adenopathy.  Neurological: She is alert and oriented to person, place, and time. She has normal reflexes. No cranial nerve deficit. She exhibits normal muscle tone. Coordination normal.  Skin: Skin is warm and dry. No rash noted. She is not diaphoretic. No erythema. No pallor.  Psychiatric: She has a normal mood and affect. Her behavior is normal. Judgment and thought content normal.  Vitals reviewed.  Vitals:   01/14/18 1148  BP: 132/70  Pulse: (!) 105  SpO2: 95%  Weight: 180 lb 6.4 oz (81.8 kg)  Height: 5\' 4"  (1.626 m)    Estimated body mass index is 30.97 kg/m as calculated from the following:   Height as of this encounter: 5\' 4"  (1.626 m).   Weight as of this encounter: 180 lb 6.4 oz (81.8 kg).     Assessment:       ICD-10-CM   1. Chronic respiratory failure with hypoxia (HCC) J96.11   2. Stage 4 very severe COPD by GOLD classification ([place]) J44.9   3.  Multiple lung nodules on CT R91.8        Plan:     Chronic respiratory failure with hypoxia (HCC) Stage 4 very severe COPD by GOLD classification (HCC)  - stable disease - change spiriva and breo to trelegy daily  - when taking trelegy do not take spiriva or breo - use albuterol as needed  - continue daily o2  - agree to hold off abg test for high CO2 - take a picture of inspriatory muscle load trainer  Multiple lung nodules on CT -1 x  RUL nodule hs resolved as of June 2019 - But there is the old 1cm RUL nodule and a new RLL nodule  - repeat CT chest without contrast in 6 months   Followup  6 months or sooner; CAT score at CT chest at followup   Dr. Brand Males, M.D., [facility].C.P Pulmonary and Critical Care Medicine Staff Physician, [place] Director - Interstitial Lung Disease  Program  Pulmonary [place] [facility] [place], [place], [address]  Pager: [phone], If no answer or between  15:00h - 7:00h: call [phone] Telephone: [phone]

## 2018-01-14 NOTE — Patient Instructions (Addendum)
Chronic respiratory failure with hypoxia (HCC) Stage 4 very severe COPD by GOLD classification ([place])  - stable disease - change spiriva and breo to trelegy daily  - when taking trelegy do not take spiriva or breo - use albuterol as needed  - continue daily o2  - agree to hold off abg test for high CO2 - take a picture of inspriatory muscle load trainer  Multiple lung nodules on CT -1 x  RUL nodule hs resolved as of June 2019 - But there is the old 1cm RUL nodule and a new RLL nodule  - repeat CT chest without contrast in 6 months   Followup  6 months or sooner; CAT score at CT chest at followup

## 2018-01-14 NOTE — Addendum Note (Signed)
Addended by: Mathis Bud on: 01/14/2018 12:50 PM   Modules accepted: Orders

## 2018-01-21 DIAGNOSIS — N1330 Unspecified hydronephrosis: Secondary | ICD-10-CM | POA: Diagnosis not present

## 2018-01-21 DIAGNOSIS — N200 Calculus of kidney: Secondary | ICD-10-CM | POA: Diagnosis not present

## 2018-01-26 ENCOUNTER — Other Ambulatory Visit: Payer: Self-pay | Admitting: Urology

## 2018-01-26 DIAGNOSIS — N132 Hydronephrosis with renal and ureteral calculous obstruction: Secondary | ICD-10-CM | POA: Diagnosis not present

## 2018-01-26 DIAGNOSIS — N130 Hydronephrosis with ureteropelvic junction obstruction: Secondary | ICD-10-CM | POA: Diagnosis not present

## 2018-01-26 DIAGNOSIS — N1330 Unspecified hydronephrosis: Secondary | ICD-10-CM

## 2018-01-26 DIAGNOSIS — R3121 Asymptomatic microscopic hematuria: Secondary | ICD-10-CM | POA: Diagnosis not present

## 2018-01-28 ENCOUNTER — Encounter (HOSPITAL_COMMUNITY): Payer: Self-pay | Admitting: *Deleted

## 2018-01-28 ENCOUNTER — Other Ambulatory Visit: Payer: Self-pay | Admitting: Urology

## 2018-01-28 ENCOUNTER — Other Ambulatory Visit: Payer: Self-pay

## 2018-01-30 DIAGNOSIS — J449 Chronic obstructive pulmonary disease, unspecified: Secondary | ICD-10-CM | POA: Diagnosis not present

## 2018-02-01 ENCOUNTER — Ambulatory Visit (HOSPITAL_COMMUNITY): Payer: PPO | Admitting: Certified Registered Nurse Anesthetist

## 2018-02-01 ENCOUNTER — Ambulatory Visit (HOSPITAL_COMMUNITY)
Admission: RE | Admit: 2018-02-01 | Discharge: 2018-02-01 | Disposition: A | Discharge status: Home / Self Care | Payer: PPO | Source: Ambulatory Visit | Attending: Urology | Admitting: Urology

## 2018-02-01 ENCOUNTER — Telehealth (HOSPITAL_COMMUNITY): Payer: Self-pay | Admitting: *Deleted

## 2018-02-01 ENCOUNTER — Ambulatory Visit (HOSPITAL_COMMUNITY): Payer: PPO

## 2018-02-01 ENCOUNTER — Encounter (HOSPITAL_COMMUNITY): Payer: Self-pay | Admitting: *Deleted

## 2018-02-01 ENCOUNTER — Encounter (HOSPITAL_COMMUNITY)
Admission: RE | Disposition: A | Discharge status: Home / Self Care | Payer: Self-pay | Source: Ambulatory Visit | Attending: Urology

## 2018-02-01 ENCOUNTER — Other Ambulatory Visit: Payer: Self-pay

## 2018-02-01 DIAGNOSIS — F419 Anxiety disorder, unspecified: Secondary | ICD-10-CM | POA: Diagnosis not present

## 2018-02-01 DIAGNOSIS — Z87891 Personal history of nicotine dependence: Secondary | ICD-10-CM | POA: Diagnosis not present

## 2018-02-01 DIAGNOSIS — Z90710 Acquired absence of both cervix and uterus: Secondary | ICD-10-CM | POA: Insufficient documentation

## 2018-02-01 DIAGNOSIS — Z87442 Personal history of urinary calculi: Secondary | ICD-10-CM | POA: Diagnosis not present

## 2018-02-01 DIAGNOSIS — I10 Essential (primary) hypertension: Secondary | ICD-10-CM | POA: Diagnosis not present

## 2018-02-01 DIAGNOSIS — Z9842 Cataract extraction status, left eye: Secondary | ICD-10-CM | POA: Diagnosis not present

## 2018-02-01 DIAGNOSIS — Z79899 Other long term (current) drug therapy: Secondary | ICD-10-CM | POA: Insufficient documentation

## 2018-02-01 DIAGNOSIS — Z9981 Dependence on supplemental oxygen: Secondary | ICD-10-CM | POA: Diagnosis not present

## 2018-02-01 DIAGNOSIS — Z7951 Long term (current) use of inhaled steroids: Secondary | ICD-10-CM | POA: Diagnosis not present

## 2018-02-01 DIAGNOSIS — J449 Chronic obstructive pulmonary disease, unspecified: Secondary | ICD-10-CM | POA: Insufficient documentation

## 2018-02-01 DIAGNOSIS — Z91040 Latex allergy status: Secondary | ICD-10-CM | POA: Diagnosis not present

## 2018-02-01 DIAGNOSIS — N132 Hydronephrosis with renal and ureteral calculous obstruction: Secondary | ICD-10-CM | POA: Diagnosis not present

## 2018-02-01 DIAGNOSIS — Z888 Allergy status to other drugs, medicaments and biological substances status: Secondary | ICD-10-CM | POA: Insufficient documentation

## 2018-02-01 DIAGNOSIS — K219 Gastro-esophageal reflux disease without esophagitis: Secondary | ICD-10-CM | POA: Insufficient documentation

## 2018-02-01 DIAGNOSIS — Z9841 Cataract extraction status, right eye: Secondary | ICD-10-CM | POA: Diagnosis not present

## 2018-02-01 DIAGNOSIS — N201 Calculus of ureter: Secondary | ICD-10-CM | POA: Diagnosis not present

## 2018-02-01 HISTORY — DX: Personal history of urinary calculi: Z87.442

## 2018-02-01 HISTORY — PX: CYSTOSCOPY WITH RETROGRADE PYELOGRAM, URETEROSCOPY AND STENT PLACEMENT: SHX5789

## 2018-02-01 LAB — CBC
HCT: 45.8 % (ref 36.0–46.0)
HEMOGLOBIN: 14.9 g/dL (ref 12.0–15.0)
MCH: 29.2 pg (ref 26.0–34.0)
MCHC: 32.5 g/dL (ref 30.0–36.0)
MCV: 89.6 fL (ref 78.0–100.0)
PLATELETS: 157 10*3/uL (ref 150–400)
RBC: 5.11 MIL/uL (ref 3.87–5.11)
RDW: 13.5 % (ref 11.5–15.5)
WBC: 6.8 10*3/uL (ref 4.0–10.5)

## 2018-02-01 SURGERY — CYSTOURETEROSCOPY, WITH RETROGRADE PYELOGRAM AND STENT INSERTION
Anesthesia: General | Laterality: Left

## 2018-02-01 MED ORDER — FENTANYL CITRATE (PF) 100 MCG/2ML IJ SOLN
INTRAMUSCULAR | Status: AC
  Filled 2018-02-01: qty 2

## 2018-02-01 MED ORDER — PROPOFOL 10 MG/ML IV BOLUS
INTRAVENOUS | Status: DC | PRN
  Administered 2018-02-01: 150 mg via INTRAVENOUS
  Administered 2018-02-01 (×2): 20 mg via INTRAVENOUS
  Administered 2018-02-01: 50 mg via INTRAVENOUS

## 2018-02-01 MED ORDER — CEFAZOLIN SODIUM-DEXTROSE 2-4 GM/100ML-% IV SOLN
2.0000 g | INTRAVENOUS | Status: AC
  Administered 2018-02-01: 2 g via INTRAVENOUS
  Filled 2018-02-01: qty 100

## 2018-02-01 MED ORDER — LACTATED RINGERS IV SOLN
INTRAVENOUS | Status: DC
  Administered 2018-02-01: 13:00:00 via INTRAVENOUS

## 2018-02-01 MED ORDER — HYDROCODONE-ACETAMINOPHEN 5-325 MG PO TABS: 1.0000 | ORAL_TABLET | ORAL | 0 refills | Status: AC | PRN

## 2018-02-01 MED ORDER — PROPOFOL 10 MG/ML IV BOLUS
INTRAVENOUS | Status: AC
  Filled 2018-02-01: qty 20

## 2018-02-01 MED ORDER — ONDANSETRON HCL 4 MG/2ML IJ SOLN
INTRAMUSCULAR | Status: DC | PRN
  Administered 2018-02-01: 4 mg via INTRAVENOUS

## 2018-02-01 MED ORDER — LIDOCAINE 2% (20 MG/ML) 5 ML SYRINGE
INTRAMUSCULAR | Status: DC | PRN
  Administered 2018-02-01: 100 mg via INTRAVENOUS

## 2018-02-01 MED ORDER — SODIUM CHLORIDE 0.9 % IR SOLN
Status: DC | PRN
  Administered 2018-02-01: 6000 mL

## 2018-02-01 MED ORDER — FENTANYL CITRATE (PF) 100 MCG/2ML IJ SOLN
INTRAMUSCULAR | Status: DC | PRN
  Administered 2018-02-01: 50 ug via INTRAVENOUS

## 2018-02-01 MED ORDER — IOHEXOL 300 MG/ML  SOLN
INTRAMUSCULAR | Status: DC | PRN
  Administered 2018-02-01: 19 mL via INTRAVENOUS

## 2018-02-01 MED ORDER — PHENYLEPHRINE 40 MCG/ML (10ML) SYRINGE FOR IV PUSH (FOR BLOOD PRESSURE SUPPORT)
PREFILLED_SYRINGE | INTRAVENOUS | Status: DC | PRN
  Administered 2018-02-01 (×3): 80 ug via INTRAVENOUS

## 2018-02-01 MED ORDER — EPHEDRINE SULFATE-NACL 50-0.9 MG/10ML-% IV SOSY
PREFILLED_SYRINGE | INTRAVENOUS | Status: DC | PRN
  Administered 2018-02-01: 10 mg via INTRAVENOUS

## 2018-02-01 MED ORDER — DEXAMETHASONE SODIUM PHOSPHATE 10 MG/ML IJ SOLN
INTRAMUSCULAR | Status: DC | PRN
  Administered 2018-02-01: 10 mg via INTRAVENOUS

## 2018-02-01 SURGICAL SUPPLY — 20 items
BAG URO CATCHER STRL LF (MISCELLANEOUS) ×2 IMPLANT
BASKET LASER NITINOL 1.9FR (BASKET) IMPLANT
BASKET ZERO TIP NITINOL 2.4FR (BASKET) IMPLANT
CATH INTERMIT  6FR 70CM (CATHETERS) ×2 IMPLANT
CATH URET 5FR 28IN CONE TIP (BALLOONS)
CATH URET 5FR 70CM CONE TIP (BALLOONS) IMPLANT
CLOTH BEACON ORANGE TIMEOUT ST (SAFETY) ×2 IMPLANT
EXTRACTOR STONE 1.7FRX115CM (UROLOGICAL SUPPLIES) IMPLANT
FIBER LASER FLEXIVA 365 (UROLOGICAL SUPPLIES) IMPLANT
FIBER LASER TRAC TIP (UROLOGICAL SUPPLIES) ×2 IMPLANT
GLOVE BIO SURGEON STRL SZ7.5 (GLOVE) ×2 IMPLANT
GOWN STRL REUS W/TWL XL LVL3 (GOWN DISPOSABLE) ×2 IMPLANT
GUIDEWIRE ANG ZIPWIRE 038X150 (WIRE) IMPLANT
GUIDEWIRE STR DUAL SENSOR (WIRE) ×2 IMPLANT
INFUSOR MANOMETER BAG 3000ML (MISCELLANEOUS) IMPLANT
MANIFOLD NEPTUNE II (INSTRUMENTS) ×2 IMPLANT
PACK CYSTO (CUSTOM PROCEDURE TRAY) ×2 IMPLANT
SHEATH URETERAL 12FRX28CM (UROLOGICAL SUPPLIES) IMPLANT
SHEATH URETERAL 12FRX35CM (MISCELLANEOUS) IMPLANT
TUBING UROLOGY SET (TUBING) ×2 IMPLANT

## 2018-02-01 NOTE — Op Note (Signed)
Operative Note  Preoperative diagnosis:  1.  Left ureteral calculus  Postoperative diagnosis: 1.  Left ureteral calculus  Procedure(s): 1.  Cystoscopy with left ureteroscopy, laser lithotripsy, retrograde pyelogram, ureteral stent placement  Surgeon: Link Snuffer, MD  Assistants: None  Anesthesia: General  Complications: None immediate  EBL: Minimal  Specimens: 1.  None  Drains/Catheters: 1.  6 x 24 double-J ureteral stent  Intraoperative findings: Normal urethra and bladder 2.  Distal inflammation of the ureter with a 7 millimeter ureteral calculus just proximal to this.  There was upstream hydronephrosis confirmed by retrograde pyelogram without any other filling defects.  There were no other ureteral calculi on diagnostic ureteroscopy  Indication: 72 year old female recently underwent a left ureteroscopy with laser lithotripsy and ureteral stent placement.  Follow-up renal ultrasound stent removal revealed hydronephrosis. CT revealed a distal ureteral calculus  Description of procedure:  The patient was identified and consent was obtained.  The patient was taken to the operating room and placed in the supine position.  The patient was placed under general anesthesia.  Perioperative antibiotics were administered.  The patient was placed in dorsal lithotomy.  Patient was prepped and draped in a standard sterile fashion and a timeout was performed.  21 to the urethra and into the bladder.  A sensor wire was passed up the ureter under fluoroscopic guidance into the kidney.  A semirigid ureteroscope was advanced along the wire to the stone of interest which was fragmented to less than 1 mm fragments.  I then advanced the semirigid ureteroscope up to the renal pelvis and no other calculi were seen.  I then shot a retrograde pyelogram through the scope with the findings noted above.  I withdrew the scope and backloaded wire onto a rigid cystoscope which was advanced into the bladder.   A 6 x 24 double-J ureteral stent was placed in a standard fashion followed by removal of the wire.  Fluoroscopy confirmed proximal placement and direct visualization confirmed a good coil within the bladder.  The bladder was drained and the scope withdrawn.  This concluded the operation.  The patient tolerated procedure well and was stable postoperatively.  Plan: Remove the stent in 1 week.

## 2018-02-01 NOTE — Interval H&P Note (Signed)
History and Physical Interval Note:  02/01/2018 2:01 PM  Brandy Morales  has presented today for surgery, with the diagnosis of left ureteral stone  The various methods of treatment have been discussed with the patient and family. After consideration of risks, benefits and other options for treatment, the patient has consented to  Procedure(s): CYSTOSCOPY WITH LEFT RETROGRADE PYELOGRAM, URETEROSCOPY HOLMIUM LASER AND STENT PLACEMENT (Left) as a surgical intervention .  The patient's history has been reviewed, patient examined, no change in status, stable for surgery.  I have reviewed the patient's chart and labs.  Questions were answered to the patient's satisfaction.     Marton Redwood, III

## 2018-02-01 NOTE — Anesthesia Postprocedure Evaluation (Signed)
Anesthesia Post Note  Patient: Aneita Kiger  Procedure(s) Performed: CYSTOSCOPY WITH LEFT RETROGRADE PYELOGRAM, URETEROSCOPY HOLMIUM LASER AND STENT PLACEMENT (Left )     Patient location during evaluation: PACU Anesthesia Type: General Level of consciousness: awake and alert Pain management: pain level controlled Vital Signs Assessment: post-procedure vital signs reviewed and stable Respiratory status: spontaneous breathing, nonlabored ventilation, respiratory function stable and patient connected to nasal cannula oxygen Cardiovascular status: blood pressure returned to baseline and stable Postop Assessment: no apparent nausea or vomiting Anesthetic complications: no    Last Vitals:  Vitals:   02/01/18 1515 02/01/18 1525  BP: 125/73 136/78  Pulse: 81 79  Resp: 16 18  Temp:  36.6 C  SpO2: 99% 100%    Last Pain:  Vitals:   02/01/18 1515  TempSrc:   PainSc: 0-No pain                 Effie Berkshire

## 2018-02-01 NOTE — Transfer of Care (Signed)
Immediate Anesthesia Transfer of Care Note  Patient: Brandy Morales  Procedure(s) Performed: CYSTOSCOPY WITH LEFT RETROGRADE PYELOGRAM, URETEROSCOPY HOLMIUM LASER AND STENT PLACEMENT (Left )  Patient Location: PACU  Anesthesia Type:General  Level of Consciousness: awake, alert , oriented and patient cooperative  Airway & Oxygen Therapy: Patient Spontanous Breathing and Patient connected to face mask oxygen  Post-op Assessment: Report given to RN and Post -op Vital signs reviewed and stable  Post vital signs: Reviewed and stable  Last Vitals:  Vitals Value Taken Time  BP 124/72 02/01/2018  3:02 PM  Temp    Pulse 86 02/01/2018  3:05 PM  Resp    SpO2 100 % 02/01/2018  3:05 PM  Vitals shown include unvalidated device data.  Last Pain:  Vitals:   02/01/18 1231  TempSrc: Oral  PainSc: 0-No pain      Patients Stated Pain Goal: 3 (20/35/59 7416)  Complications: No apparent anesthesia complications

## 2018-02-01 NOTE — Progress Notes (Signed)
Pt has voided qs, tolerated fluids well with no nausea, minimal pain; voiced understanding d/c instructions; to car via w/c with step-daughter to drive pt home

## 2018-02-01 NOTE — Anesthesia Preprocedure Evaluation (Addendum)
Anesthesia Evaluation  Patient identified by MRN, date of birth, ID band Patient awake    Reviewed: Allergy & Precautions, NPO status , Patient's Chart, lab work & pertinent test results  Airway Mallampati: I  TM Distance: >3 FB Neck ROM: Full    Dental  (+) Teeth Intact, Dental Advisory Given   Pulmonary asthma , COPD,  oxygen dependent, former smoker,     + decreased breath sounds      Cardiovascular hypertension, Pt. on medications Normal cardiovascular exam Rhythm:Regular Rate:Normal     Neuro/Psych Anxiety negative neurological ROS     GI/Hepatic Neg liver ROS, GERD  Medicated and Controlled,  Endo/Other  negative endocrine ROS  Renal/GU Renal disease     Musculoskeletal   Abdominal Normal abdominal exam  (+)   Peds  Hematology negative hematology ROS (+)   Anesthesia Other Findings   Reproductive/Obstetrics                            Lab Results  Component Value Date   WBC 6.1 11/25/2017   HGB 15.8 (H) 11/25/2017   HCT 47.8 (H) 11/25/2017   MCV 90.4 11/25/2017   PLT 192 11/25/2017   Lab Results  Component Value Date   CREATININE 0.82 11/25/2017   BUN 19 11/25/2017   NA 142 11/25/2017   K 4.4 11/25/2017   CL 101 11/25/2017   CO2 35 (H) 11/25/2017    Anesthesia Physical  Anesthesia Plan  ASA: III  Anesthesia Plan: General   Post-op Pain Management:    Induction: Intravenous  PONV Risk Score and Plan: 3 and Ondansetron, Dexamethasone and Midazolam  Airway Management Planned: LMA  Additional Equipment:   Intra-op Plan:   Post-operative Plan: Extubation in OR  Informed Consent: I have reviewed the patients History and Physical, chart, labs and discussed the procedure including the risks, benefits and alternatives for the proposed anesthesia with the patient or authorized representative who has indicated his/her understanding and acceptance.     Plan  Discussed with: CRNA and Surgeon  Anesthesia Plan Comments:         Anesthesia Quick Evaluation

## 2018-02-01 NOTE — H&P (Signed)
H&P  Chief Complaint: left ureteral calculus  History of Present Illness: 72 YO F with L ureteral stone here for URS/LL.  Past Medical History:  Diagnosis Date  . Anxiety   . Asthma   . COPD (chronic obstructive pulmonary disease) ([place])   . GERD (gastroesophageal reflux disease)   . History of kidney stones   . Hypertension    Past Surgical History:  Procedure Laterality Date  . ABDOMINAL HYSTERECTOMY  age 54   partial 1 ovary left  . APPENDECTOMY    . colonscopy    . CYSTOSCOPY WITH RETROGRADE PYELOGRAM, URETEROSCOPY AND STENT PLACEMENT N/A 11/30/2017   Procedure: CYSTOSCOPY WITH BILATERAL RETROGRADE PYELOGRAM, LEFT URETEROSCOPY/ LEFT LASER LITHO AND LEFT STENT PLACEMENT;  Surgeon: Lucas Mallow, MD;  Location: [facility];  Service: Urology;  Laterality: N/A;  . EYE SURGERY Bilateral    Cataract  . TONSILLECTOMY      Home Medications:  Medications Prior to Admission  Medication Sig Dispense Refill Last Dose  . Artificial Tear Solution (GENTEAL TEARS OP) Place 1 drop into both eyes daily.    02/01/2018 at [place]  . Cholecalciferol (VITAMIN D3) 5000 units CAPS Take 5,000 Units by mouth daily.   01/31/2018 at Unknown time  . citalopram (CELEXA) 20 MG tablet Take 20 mg by mouth daily.   02/01/2018 at [place]  . COENZYME Q10 PO Take 10 mLs by mouth daily.    01/31/2018 at Unknown time  . fexofenadine (ALLEGRA) 180 MG tablet Take 180 mg by mouth daily.   02/01/2018 at [place]  . fluticasone (FLONASE) 50 MCG/ACT nasal spray Place 1 spray into both nostrils daily.    02/01/2018 at [place]  . Fluticasone-Umeclidin-Vilant (TRELEGY ELLIPTA) 100-62.5-25 MCG/INH AEPB Inhale 1 puff into the lungs every morning. 1 each 6 02/01/2018 at [place]  . levalbuterol (XOPENEX) 1.25 MG/0.5ML nebulizer solution Take 1.25 mg by nebulization every 4 (four) hours as needed for wheezing or shortness of breath.   02/01/2018 at 0800  . losartan-hydrochlorothiazide (HYZAAR) 100-25 MG tablet Take 0.5 tablets by mouth daily.   01/31/2018 at  Unknown time  . Multiple Vitamins-Minerals (MULTIVITAMIN PO) Take 6 each by mouth daily.    01/31/2018 at Unknown time  . naproxen sodium (ALEVE) 220 MG tablet Take 220 mg by mouth daily.   01/30/2018 at Unknown time  . omeprazole (PRILOSEC) 20 MG capsule Take 20 mg by mouth daily.    02/01/2018 at [place]  . OXYGEN Inhale 2 L into the lungs continuous.    02/01/2018 at [place]  . acyclovir (ZOVIRAX) 400 MG tablet Take 400 mg by mouth 3 (three) times daily as needed (for outbreak).    More than a month at Unknown time  . albuterol (PROVENTIL HFA;VENTOLIN HFA) 108 (90 Base) MCG/ACT inhaler Inhale 2 puffs into the lungs every 6 (six) hours as needed for wheezing or shortness of breath. 1 Inhaler 6 More than a month at Unknown time   Allergies:  Allergies  Allergen Reactions  . Statins Cough  . Latex Other (See Comments)    Broke out on face    History reviewed. No pertinent family history. Social History:  reports that she quit smoking about 3 years ago. Her smoking use included cigarettes. She has a 110.00 pack-year smoking history. She has never used smokeless tobacco. She reports that she drinks about 1.0 standard drinks of alcohol per week. She reports that she does not use drugs.  ROS: A complete review of systems was performed.  All  systems are negative except for pertinent findings as noted. ROS   Physical Exam:  Vital signs in last 24 hours: Temp:  [98.1 F (36.7 C)] 98.1 F (36.7 C) (09/09 1231) Pulse Rate:  [88] 88 (09/09 1231) BP: (155)/(83) 155/83 (09/09 1231) SpO2:  [97 %] 97 % (09/09 1231) Weight:  [81.6 kg] 81.6 kg (09/09 1231) General:  Alert and oriented, No acute distress HEENT: Normocephalic, atraumatic Neck: No JVD or lymphadenopathy Cardiovascular: Regular rate and rhythm Lungs: Regular rate and effort Abdomen: Soft, nontender, nondistended, no abdominal masses Back: No CVA tenderness Extremities: No edema Neurologic: Grossly intact  Laboratory Data:  Results for  orders placed or performed during the hospital encounter of 02/01/18 (from the past 24 hour(s))  CBC     Status: None   Collection Time: 02/01/18 12:26 PM  Result Value Ref Range   WBC 6.8 4.0 - 10.5 K/uL   RBC 5.11 3.87 - 5.11 MIL/uL   Hemoglobin 14.9 12.0 - 15.0 g/dL   HCT 45.8 36.0 - 46.0 %   MCV 89.6 78.0 - 100.0 fL   MCH 29.2 26.0 - 34.0 pg   MCHC 32.5 30.0 - 36.0 g/dL   RDW 13.5 11.5 - 15.5 %   Platelets 157 150 - 400 K/uL   No results found for this or any previous visit (from the past 240 hour(s)). Creatinine: No results for input(s): CREATININE in the last 168 hours.  Impression/Assessment:  L ureteralstone  Plan:  Proceed w/ L URS/LL/stent  Marton Redwood, III 02/01/2018, 2:01 PM

## 2018-02-01 NOTE — Discharge Instructions (Signed)

## 2018-02-01 NOTE — Anesthesia Procedure Notes (Addendum)
Procedure Name: LMA Insertion Date/Time: 02/01/2018 2:17 PM Performed by: West Pugh, CRNA Pre-anesthesia Checklist: Patient identified, Emergency Drugs available, Suction available, Patient being monitored and Timeout performed Patient Re-evaluated:Patient Re-evaluated prior to induction Oxygen Delivery Method: Circle system utilized Preoxygenation: Pre-oxygenation with 100% oxygen Induction Type: IV induction LMA: LMA with gastric port inserted LMA Size: 4.0 Placement Confirmation: positive ETCO2 and breath sounds checked- equal and bilateral Tube secured with: Tape Dental Injury: Teeth and Oropharynx as per pre-operative assessment  Comments: Inserted by Dr. Smith Robert

## 2018-02-02 ENCOUNTER — Encounter (HOSPITAL_COMMUNITY): Payer: Self-pay | Admitting: Urology

## 2018-02-03 ENCOUNTER — Encounter (HOSPITAL_COMMUNITY): Payer: PPO

## 2018-02-10 DIAGNOSIS — N200 Calculus of kidney: Secondary | ICD-10-CM | POA: Diagnosis not present

## 2018-02-10 DIAGNOSIS — N1330 Unspecified hydronephrosis: Secondary | ICD-10-CM | POA: Diagnosis not present

## 2018-02-13 DIAGNOSIS — Z1231 Encounter for screening mammogram for malignant neoplasm of breast: Secondary | ICD-10-CM | POA: Diagnosis not present

## 2018-02-15 ENCOUNTER — Other Ambulatory Visit: Payer: Self-pay | Admitting: *Deleted

## 2018-02-15 MED ORDER — FLUTICASONE-UMECLIDIN-VILANT 100-62.5-25 MCG/INH IN AEPB: 1.0000 | INHALATION_SPRAY | Freq: Every morning | RESPIRATORY_TRACT | 1 refills | Status: DC

## 2018-02-16 ENCOUNTER — Telehealth: Payer: Self-pay | Admitting: Internal Medicine

## 2018-02-16 MED ORDER — FLUTICASONE-UMECLIDIN-VILANT 100-62.5-25 MCG/INH IN AEPB: 1.0000 | INHALATION_SPRAY | Freq: Every morning | RESPIRATORY_TRACT | 1 refills | Status: DC

## 2018-02-16 NOTE — Telephone Encounter (Signed)
Called and spoke to pt.  Pt is requesting Rx for Trelegy, as she feels this medication is effective.  Rx for Trelegy has been sent to preferred pharmacy. Nothing further is needed.

## 2018-02-19 ENCOUNTER — Telehealth: Payer: Self-pay | Admitting: Internal Medicine

## 2018-02-19 MED ORDER — TIOTROPIUM BROMIDE MONOHYDRATE 18 MCG IN CAPS: 18.0000 ug | ORAL_CAPSULE | Freq: Every day | RESPIRATORY_TRACT | 6 refills | Status: DC

## 2018-02-19 NOTE — Telephone Encounter (Signed)
Called pt and advised message from the provider. Pt understood and verbalized understanding. Nothing further is needed.   Rx for Spiriva sent in.

## 2018-02-19 NOTE — Telephone Encounter (Signed)
This is fine to switch back and for you to do refill

## 2018-02-19 NOTE — Telephone Encounter (Signed)
Spoke with pt. States that she has run out of Trelegy samples. Since this happened, she went back to [place]. She feels better since switching back. Pt would like to continue with these medications vs being on Trelegy. Pt will need refills on Spiriva.  MR - please advise if this is okay with you. Thanks.

## 2018-03-01 DIAGNOSIS — J449 Chronic obstructive pulmonary disease, unspecified: Secondary | ICD-10-CM | POA: Diagnosis not present

## 2018-03-02 DIAGNOSIS — D4100 Neoplasm of uncertain behavior of unspecified kidney: Secondary | ICD-10-CM | POA: Diagnosis not present

## 2018-03-02 DIAGNOSIS — Z79899 Other long term (current) drug therapy: Secondary | ICD-10-CM | POA: Diagnosis not present

## 2018-03-02 DIAGNOSIS — E782 Mixed hyperlipidemia: Secondary | ICD-10-CM | POA: Diagnosis not present

## 2018-03-02 DIAGNOSIS — R0902 Hypoxemia: Secondary | ICD-10-CM | POA: Diagnosis not present

## 2018-03-02 DIAGNOSIS — I10 Essential (primary) hypertension: Secondary | ICD-10-CM | POA: Diagnosis not present

## 2018-03-02 DIAGNOSIS — E559 Vitamin D deficiency, unspecified: Secondary | ICD-10-CM | POA: Diagnosis not present

## 2018-03-02 DIAGNOSIS — J449 Chronic obstructive pulmonary disease, unspecified: Secondary | ICD-10-CM | POA: Diagnosis not present

## 2018-03-10 DIAGNOSIS — N200 Calculus of kidney: Secondary | ICD-10-CM | POA: Diagnosis not present

## 2018-04-01 DIAGNOSIS — J449 Chronic obstructive pulmonary disease, unspecified: Secondary | ICD-10-CM | POA: Diagnosis not present

## 2018-04-27 DIAGNOSIS — J441 Chronic obstructive pulmonary disease with (acute) exacerbation: Secondary | ICD-10-CM | POA: Diagnosis not present

## 2018-05-01 DIAGNOSIS — J449 Chronic obstructive pulmonary disease, unspecified: Secondary | ICD-10-CM | POA: Diagnosis not present

## 2018-05-25 DIAGNOSIS — E782 Mixed hyperlipidemia: Secondary | ICD-10-CM | POA: Diagnosis not present

## 2018-05-25 DIAGNOSIS — J441 Chronic obstructive pulmonary disease with (acute) exacerbation: Secondary | ICD-10-CM | POA: Diagnosis not present

## 2018-05-25 DIAGNOSIS — J449 Chronic obstructive pulmonary disease, unspecified: Secondary | ICD-10-CM | POA: Diagnosis not present

## 2018-05-25 DIAGNOSIS — I10 Essential (primary) hypertension: Secondary | ICD-10-CM | POA: Diagnosis not present

## 2018-05-25 DIAGNOSIS — D4100 Neoplasm of uncertain behavior of unspecified kidney: Secondary | ICD-10-CM | POA: Diagnosis not present

## 2018-06-01 DIAGNOSIS — J449 Chronic obstructive pulmonary disease, unspecified: Secondary | ICD-10-CM | POA: Diagnosis not present

## 2018-07-02 DIAGNOSIS — J449 Chronic obstructive pulmonary disease, unspecified: Secondary | ICD-10-CM | POA: Diagnosis not present

## 2018-07-13 ENCOUNTER — Ambulatory Visit: Payer: PPO | Admitting: Internal Medicine

## 2018-07-19 ENCOUNTER — Inpatient Hospital Stay: Admission: RE | Admit: 2018-07-19 | Payer: PPO | Source: Ambulatory Visit

## 2018-07-19 DIAGNOSIS — J449 Chronic obstructive pulmonary disease, unspecified: Secondary | ICD-10-CM | POA: Diagnosis not present

## 2018-07-19 DIAGNOSIS — J441 Chronic obstructive pulmonary disease with (acute) exacerbation: Secondary | ICD-10-CM | POA: Diagnosis not present

## 2018-07-19 DIAGNOSIS — E782 Mixed hyperlipidemia: Secondary | ICD-10-CM | POA: Diagnosis not present

## 2018-07-19 DIAGNOSIS — D4100 Neoplasm of uncertain behavior of unspecified kidney: Secondary | ICD-10-CM | POA: Diagnosis not present

## 2018-07-19 DIAGNOSIS — I10 Essential (primary) hypertension: Secondary | ICD-10-CM | POA: Diagnosis not present

## 2018-07-27 ENCOUNTER — Ambulatory Visit: Payer: PPO | Admitting: Internal Medicine

## 2018-07-29 DIAGNOSIS — J441 Chronic obstructive pulmonary disease with (acute) exacerbation: Secondary | ICD-10-CM | POA: Diagnosis not present

## 2018-07-31 DIAGNOSIS — J449 Chronic obstructive pulmonary disease, unspecified: Secondary | ICD-10-CM | POA: Diagnosis not present

## 2018-08-10 ENCOUNTER — Inpatient Hospital Stay: Admission: RE | Admit: 2018-08-10 | Payer: PPO | Source: Ambulatory Visit

## 2018-08-17 DIAGNOSIS — I10 Essential (primary) hypertension: Secondary | ICD-10-CM | POA: Diagnosis not present

## 2018-08-17 DIAGNOSIS — J441 Chronic obstructive pulmonary disease with (acute) exacerbation: Secondary | ICD-10-CM | POA: Diagnosis not present

## 2018-08-17 DIAGNOSIS — E782 Mixed hyperlipidemia: Secondary | ICD-10-CM | POA: Diagnosis not present

## 2018-08-17 DIAGNOSIS — J449 Chronic obstructive pulmonary disease, unspecified: Secondary | ICD-10-CM | POA: Diagnosis not present

## 2018-08-17 DIAGNOSIS — D4100 Neoplasm of uncertain behavior of unspecified kidney: Secondary | ICD-10-CM | POA: Diagnosis not present

## 2018-08-24 ENCOUNTER — Ambulatory Visit: Payer: PPO | Admitting: Internal Medicine

## 2018-08-31 DIAGNOSIS — J449 Chronic obstructive pulmonary disease, unspecified: Secondary | ICD-10-CM | POA: Diagnosis not present

## 2018-09-29 DIAGNOSIS — J441 Chronic obstructive pulmonary disease with (acute) exacerbation: Secondary | ICD-10-CM | POA: Diagnosis not present

## 2018-09-29 DIAGNOSIS — I10 Essential (primary) hypertension: Secondary | ICD-10-CM | POA: Diagnosis not present

## 2018-09-29 DIAGNOSIS — D4100 Neoplasm of uncertain behavior of unspecified kidney: Secondary | ICD-10-CM | POA: Diagnosis not present

## 2018-09-29 DIAGNOSIS — E7800 Pure hypercholesterolemia, unspecified: Secondary | ICD-10-CM | POA: Diagnosis not present

## 2018-09-29 DIAGNOSIS — E782 Mixed hyperlipidemia: Secondary | ICD-10-CM | POA: Diagnosis not present

## 2018-09-29 DIAGNOSIS — J449 Chronic obstructive pulmonary disease, unspecified: Secondary | ICD-10-CM | POA: Diagnosis not present

## 2018-09-30 DIAGNOSIS — J449 Chronic obstructive pulmonary disease, unspecified: Secondary | ICD-10-CM | POA: Diagnosis not present

## 2018-10-25 DIAGNOSIS — R8290 Unspecified abnormal findings in urine: Secondary | ICD-10-CM | POA: Diagnosis not present

## 2018-10-25 DIAGNOSIS — R300 Dysuria: Secondary | ICD-10-CM | POA: Diagnosis not present

## 2018-10-27 DIAGNOSIS — E782 Mixed hyperlipidemia: Secondary | ICD-10-CM | POA: Diagnosis not present

## 2018-10-27 DIAGNOSIS — I10 Essential (primary) hypertension: Secondary | ICD-10-CM | POA: Diagnosis not present

## 2018-10-27 DIAGNOSIS — J449 Chronic obstructive pulmonary disease, unspecified: Secondary | ICD-10-CM | POA: Diagnosis not present

## 2018-10-27 DIAGNOSIS — E7800 Pure hypercholesterolemia, unspecified: Secondary | ICD-10-CM | POA: Diagnosis not present

## 2018-10-27 DIAGNOSIS — D4100 Neoplasm of uncertain behavior of unspecified kidney: Secondary | ICD-10-CM | POA: Diagnosis not present

## 2018-10-27 DIAGNOSIS — J441 Chronic obstructive pulmonary disease with (acute) exacerbation: Secondary | ICD-10-CM | POA: Diagnosis not present

## 2018-10-31 DIAGNOSIS — J449 Chronic obstructive pulmonary disease, unspecified: Secondary | ICD-10-CM | POA: Diagnosis not present

## 2018-11-30 DIAGNOSIS — J449 Chronic obstructive pulmonary disease, unspecified: Secondary | ICD-10-CM | POA: Diagnosis not present

## 2018-12-23 DIAGNOSIS — E782 Mixed hyperlipidemia: Secondary | ICD-10-CM | POA: Diagnosis not present

## 2018-12-23 DIAGNOSIS — J449 Chronic obstructive pulmonary disease, unspecified: Secondary | ICD-10-CM | POA: Diagnosis not present

## 2018-12-23 DIAGNOSIS — E7800 Pure hypercholesterolemia, unspecified: Secondary | ICD-10-CM | POA: Diagnosis not present

## 2018-12-23 DIAGNOSIS — J441 Chronic obstructive pulmonary disease with (acute) exacerbation: Secondary | ICD-10-CM | POA: Diagnosis not present

## 2018-12-23 DIAGNOSIS — D4100 Neoplasm of uncertain behavior of unspecified kidney: Secondary | ICD-10-CM | POA: Diagnosis not present

## 2018-12-23 DIAGNOSIS — I10 Essential (primary) hypertension: Secondary | ICD-10-CM | POA: Diagnosis not present

## 2018-12-31 DIAGNOSIS — J449 Chronic obstructive pulmonary disease, unspecified: Secondary | ICD-10-CM | POA: Diagnosis not present

## 2019-01-24 DIAGNOSIS — I10 Essential (primary) hypertension: Secondary | ICD-10-CM | POA: Diagnosis not present

## 2019-01-24 DIAGNOSIS — E7800 Pure hypercholesterolemia, unspecified: Secondary | ICD-10-CM | POA: Diagnosis not present

## 2019-01-24 DIAGNOSIS — J449 Chronic obstructive pulmonary disease, unspecified: Secondary | ICD-10-CM | POA: Diagnosis not present

## 2019-01-24 DIAGNOSIS — J441 Chronic obstructive pulmonary disease with (acute) exacerbation: Secondary | ICD-10-CM | POA: Diagnosis not present

## 2019-01-24 DIAGNOSIS — E782 Mixed hyperlipidemia: Secondary | ICD-10-CM | POA: Diagnosis not present

## 2019-01-24 DIAGNOSIS — D4100 Neoplasm of uncertain behavior of unspecified kidney: Secondary | ICD-10-CM | POA: Diagnosis not present

## 2019-01-27 DIAGNOSIS — E7800 Pure hypercholesterolemia, unspecified: Secondary | ICD-10-CM | POA: Diagnosis not present

## 2019-01-27 DIAGNOSIS — Z87442 Personal history of urinary calculi: Secondary | ICD-10-CM | POA: Diagnosis not present

## 2019-01-27 DIAGNOSIS — E559 Vitamin D deficiency, unspecified: Secondary | ICD-10-CM | POA: Diagnosis not present

## 2019-01-27 DIAGNOSIS — D4100 Neoplasm of uncertain behavior of unspecified kidney: Secondary | ICD-10-CM | POA: Diagnosis not present

## 2019-01-27 DIAGNOSIS — Z1389 Encounter for screening for other disorder: Secondary | ICD-10-CM | POA: Diagnosis not present

## 2019-01-27 DIAGNOSIS — J449 Chronic obstructive pulmonary disease, unspecified: Secondary | ICD-10-CM | POA: Diagnosis not present

## 2019-01-27 DIAGNOSIS — I10 Essential (primary) hypertension: Secondary | ICD-10-CM | POA: Diagnosis not present

## 2019-01-27 DIAGNOSIS — Z0001 Encounter for general adult medical examination with abnormal findings: Secondary | ICD-10-CM | POA: Diagnosis not present

## 2019-01-31 DIAGNOSIS — J449 Chronic obstructive pulmonary disease, unspecified: Secondary | ICD-10-CM | POA: Diagnosis not present

## 2019-02-11 DIAGNOSIS — Z23 Encounter for immunization: Secondary | ICD-10-CM | POA: Diagnosis not present

## 2019-02-11 DIAGNOSIS — I10 Essential (primary) hypertension: Secondary | ICD-10-CM | POA: Diagnosis not present

## 2019-02-11 DIAGNOSIS — E7800 Pure hypercholesterolemia, unspecified: Secondary | ICD-10-CM | POA: Diagnosis not present

## 2019-02-11 DIAGNOSIS — J449 Chronic obstructive pulmonary disease, unspecified: Secondary | ICD-10-CM | POA: Diagnosis not present

## 2019-02-11 DIAGNOSIS — Z87442 Personal history of urinary calculi: Secondary | ICD-10-CM | POA: Diagnosis not present

## 2019-02-11 DIAGNOSIS — E559 Vitamin D deficiency, unspecified: Secondary | ICD-10-CM | POA: Diagnosis not present

## 2019-02-23 DIAGNOSIS — I10 Essential (primary) hypertension: Secondary | ICD-10-CM | POA: Diagnosis not present

## 2019-02-23 DIAGNOSIS — E782 Mixed hyperlipidemia: Secondary | ICD-10-CM | POA: Diagnosis not present

## 2019-02-23 DIAGNOSIS — E7800 Pure hypercholesterolemia, unspecified: Secondary | ICD-10-CM | POA: Diagnosis not present

## 2019-02-23 DIAGNOSIS — J449 Chronic obstructive pulmonary disease, unspecified: Secondary | ICD-10-CM | POA: Diagnosis not present

## 2019-02-23 DIAGNOSIS — D4100 Neoplasm of uncertain behavior of unspecified kidney: Secondary | ICD-10-CM | POA: Diagnosis not present

## 2019-02-23 DIAGNOSIS — J441 Chronic obstructive pulmonary disease with (acute) exacerbation: Secondary | ICD-10-CM | POA: Diagnosis not present

## 2019-03-02 DIAGNOSIS — J449 Chronic obstructive pulmonary disease, unspecified: Secondary | ICD-10-CM | POA: Diagnosis not present

## 2019-03-11 DIAGNOSIS — N952 Postmenopausal atrophic vaginitis: Secondary | ICD-10-CM | POA: Diagnosis not present

## 2019-03-11 DIAGNOSIS — N898 Other specified noninflammatory disorders of vagina: Secondary | ICD-10-CM | POA: Diagnosis not present

## 2019-03-11 DIAGNOSIS — Z79899 Other long term (current) drug therapy: Secondary | ICD-10-CM | POA: Diagnosis not present

## 2019-03-11 DIAGNOSIS — Z01411 Encounter for gynecological examination (general) (routine) with abnormal findings: Secondary | ICD-10-CM | POA: Diagnosis not present

## 2019-03-18 DIAGNOSIS — M85852 Other specified disorders of bone density and structure, left thigh: Secondary | ICD-10-CM | POA: Diagnosis not present

## 2019-03-18 DIAGNOSIS — R29890 Loss of height: Secondary | ICD-10-CM | POA: Diagnosis not present

## 2019-03-18 DIAGNOSIS — Z90710 Acquired absence of both cervix and uterus: Secondary | ICD-10-CM | POA: Diagnosis not present

## 2019-03-18 DIAGNOSIS — Z780 Asymptomatic menopausal state: Secondary | ICD-10-CM | POA: Diagnosis not present

## 2019-03-18 DIAGNOSIS — J449 Chronic obstructive pulmonary disease, unspecified: Secondary | ICD-10-CM | POA: Diagnosis not present

## 2019-03-18 DIAGNOSIS — Z1231 Encounter for screening mammogram for malignant neoplasm of breast: Secondary | ICD-10-CM | POA: Diagnosis not present

## 2019-03-24 DIAGNOSIS — J449 Chronic obstructive pulmonary disease, unspecified: Secondary | ICD-10-CM | POA: Diagnosis not present

## 2019-03-24 DIAGNOSIS — E7800 Pure hypercholesterolemia, unspecified: Secondary | ICD-10-CM | POA: Diagnosis not present

## 2019-03-24 DIAGNOSIS — J441 Chronic obstructive pulmonary disease with (acute) exacerbation: Secondary | ICD-10-CM | POA: Diagnosis not present

## 2019-03-24 DIAGNOSIS — D4100 Neoplasm of uncertain behavior of unspecified kidney: Secondary | ICD-10-CM | POA: Diagnosis not present

## 2019-03-24 DIAGNOSIS — I10 Essential (primary) hypertension: Secondary | ICD-10-CM | POA: Diagnosis not present

## 2019-03-24 DIAGNOSIS — E782 Mixed hyperlipidemia: Secondary | ICD-10-CM | POA: Diagnosis not present

## 2019-04-02 DIAGNOSIS — J449 Chronic obstructive pulmonary disease, unspecified: Secondary | ICD-10-CM | POA: Diagnosis not present

## 2019-04-25 DIAGNOSIS — J441 Chronic obstructive pulmonary disease with (acute) exacerbation: Secondary | ICD-10-CM | POA: Diagnosis not present

## 2019-04-25 DIAGNOSIS — E782 Mixed hyperlipidemia: Secondary | ICD-10-CM | POA: Diagnosis not present

## 2019-04-25 DIAGNOSIS — I10 Essential (primary) hypertension: Secondary | ICD-10-CM | POA: Diagnosis not present

## 2019-04-25 DIAGNOSIS — E7800 Pure hypercholesterolemia, unspecified: Secondary | ICD-10-CM | POA: Diagnosis not present

## 2019-04-25 DIAGNOSIS — D4100 Neoplasm of uncertain behavior of unspecified kidney: Secondary | ICD-10-CM | POA: Diagnosis not present

## 2019-04-25 DIAGNOSIS — J449 Chronic obstructive pulmonary disease, unspecified: Secondary | ICD-10-CM | POA: Diagnosis not present

## 2019-05-02 DIAGNOSIS — J449 Chronic obstructive pulmonary disease, unspecified: Secondary | ICD-10-CM | POA: Diagnosis not present

## 2019-05-03 DIAGNOSIS — N952 Postmenopausal atrophic vaginitis: Secondary | ICD-10-CM | POA: Diagnosis not present

## 2019-05-26 DIAGNOSIS — E7800 Pure hypercholesterolemia, unspecified: Secondary | ICD-10-CM | POA: Diagnosis not present

## 2019-05-26 DIAGNOSIS — J441 Chronic obstructive pulmonary disease with (acute) exacerbation: Secondary | ICD-10-CM | POA: Diagnosis not present

## 2019-05-26 DIAGNOSIS — D4100 Neoplasm of uncertain behavior of unspecified kidney: Secondary | ICD-10-CM | POA: Diagnosis not present

## 2019-05-26 DIAGNOSIS — E782 Mixed hyperlipidemia: Secondary | ICD-10-CM | POA: Diagnosis not present

## 2019-05-26 DIAGNOSIS — J449 Chronic obstructive pulmonary disease, unspecified: Secondary | ICD-10-CM | POA: Diagnosis not present

## 2019-05-26 DIAGNOSIS — I10 Essential (primary) hypertension: Secondary | ICD-10-CM | POA: Diagnosis not present

## 2019-06-02 DIAGNOSIS — J449 Chronic obstructive pulmonary disease, unspecified: Secondary | ICD-10-CM | POA: Diagnosis not present

## 2019-06-06 DIAGNOSIS — E559 Vitamin D deficiency, unspecified: Secondary | ICD-10-CM | POA: Diagnosis not present

## 2019-06-06 DIAGNOSIS — E782 Mixed hyperlipidemia: Secondary | ICD-10-CM | POA: Diagnosis not present

## 2019-06-06 DIAGNOSIS — D4100 Neoplasm of uncertain behavior of unspecified kidney: Secondary | ICD-10-CM | POA: Diagnosis not present

## 2019-06-06 DIAGNOSIS — J449 Chronic obstructive pulmonary disease, unspecified: Secondary | ICD-10-CM | POA: Diagnosis not present

## 2019-06-06 DIAGNOSIS — Z1389 Encounter for screening for other disorder: Secondary | ICD-10-CM | POA: Diagnosis not present

## 2019-06-06 DIAGNOSIS — I10 Essential (primary) hypertension: Secondary | ICD-10-CM | POA: Diagnosis not present

## 2019-06-06 DIAGNOSIS — Z0001 Encounter for general adult medical examination with abnormal findings: Secondary | ICD-10-CM | POA: Diagnosis not present

## 2019-06-06 DIAGNOSIS — Z889 Allergy status to unspecified drugs, medicaments and biological substances status: Secondary | ICD-10-CM | POA: Diagnosis not present

## 2019-06-14 ENCOUNTER — Ambulatory Visit: Payer: PPO | Attending: Internal Medicine

## 2019-06-14 DIAGNOSIS — Z23 Encounter for immunization: Secondary | ICD-10-CM | POA: Insufficient documentation

## 2019-06-14 NOTE — Progress Notes (Signed)
   Covid-19 Vaccination Clinic  Name:  Jeryn Bertoni    MRN: [id number] DOB: 08-Aug-1945  06/14/2019  Ms. Rabold was observed post Covid-19 immunization for 15 minutes without incidence. She was provided with Vaccine Information Sheet and instruction to access the V-Safe system.   Ms. Forni was instructed to call 911 with any severe reactions post vaccine: Marland Kitchen Difficulty breathing  . Swelling of your face and throat  . A fast heartbeat  . A bad rash all over your body  . Dizziness and weakness    Immunizations Administered    Name Date Dose VIS Date Route   Pfizer COVID-19 Vaccine 06/14/2019  1:36 PM 0.3 mL 05/06/2019 Intramuscular   Manufacturer: [employer]   Lot: [id number]   [place]: [id number]

## 2019-06-16 DIAGNOSIS — E782 Mixed hyperlipidemia: Secondary | ICD-10-CM | POA: Diagnosis not present

## 2019-06-16 DIAGNOSIS — J449 Chronic obstructive pulmonary disease, unspecified: Secondary | ICD-10-CM | POA: Diagnosis not present

## 2019-06-16 DIAGNOSIS — E7800 Pure hypercholesterolemia, unspecified: Secondary | ICD-10-CM | POA: Diagnosis not present

## 2019-06-16 DIAGNOSIS — J441 Chronic obstructive pulmonary disease with (acute) exacerbation: Secondary | ICD-10-CM | POA: Diagnosis not present

## 2019-06-16 DIAGNOSIS — D4100 Neoplasm of uncertain behavior of unspecified kidney: Secondary | ICD-10-CM | POA: Diagnosis not present

## 2019-06-16 DIAGNOSIS — I10 Essential (primary) hypertension: Secondary | ICD-10-CM | POA: Diagnosis not present

## 2019-07-01 DIAGNOSIS — J449 Chronic obstructive pulmonary disease, unspecified: Secondary | ICD-10-CM | POA: Diagnosis not present

## 2019-07-05 ENCOUNTER — Ambulatory Visit: Payer: PPO

## 2019-07-05 ENCOUNTER — Ambulatory Visit: Payer: PPO | Attending: Internal Medicine

## 2019-07-05 DIAGNOSIS — Z23 Encounter for immunization: Secondary | ICD-10-CM | POA: Insufficient documentation

## 2019-07-05 NOTE — Progress Notes (Signed)
   Covid-19 Vaccination Clinic  Name:  Tkeyah Burkman    MRN: [id number] DOB: 1946/04/20  07/05/2019  Ms. Gerst was observed post Covid-19 immunization for 15 minutes without incidence. She was provided with Vaccine Information Sheet and instruction to access the V-Safe system.   Ms. Swopes was instructed to call 911 with any severe reactions post vaccine: Marland Kitchen Difficulty breathing  . Swelling of your face and throat  . A fast heartbeat  . A bad rash all over your body  . Dizziness and weakness    Immunizations Administered    Name Date Dose VIS Date Route   Pfizer COVID-19 Vaccine 07/05/2019 10:51 AM 0.3 mL 05/06/2019 Intramuscular   Manufacturer: [place]   Lot: [id number]   [place]: [id number]

## 2019-07-07 IMAGING — CT CT CHEST W/O CM
2 of 3 series · 15 of 36 positions shown, 18 images · non-contrast
Comparison: Chest CT on 03/30/2017

CLINICAL DATA: Follow-up right upper lobe pulmonary nodules.

EXAM:
CT CHEST WITHOUT CONTRAST
TECHNIQUE: Multidetector CT imaging of the chest was performed following the
standard protocol without IV contrast.

[Series 2: thorax · axial · 0.73mm/px · z∈[-376,-56]mm · 12 of 188 slices shown, 15 images]
[im 14/188  mediastinal]
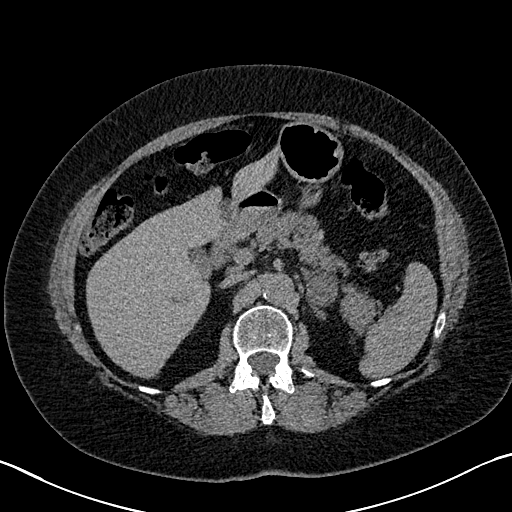
[im 14/188  lung]
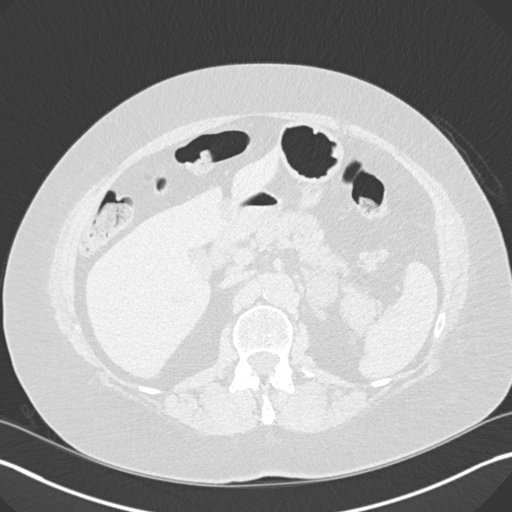
[im 28/188  lung]
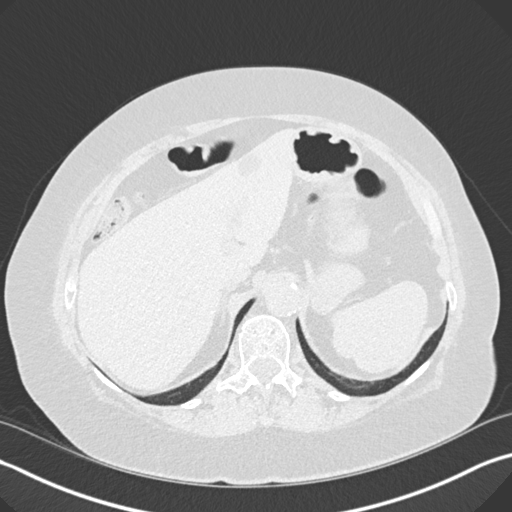
[im 42/188  lung]
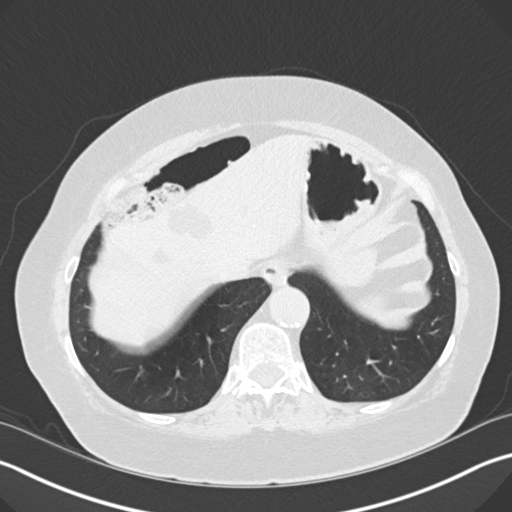
[im 56/188  lung]
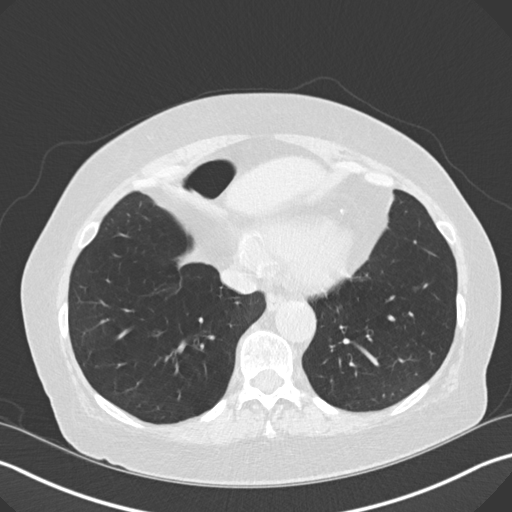
[im 70/188  mediastinal]
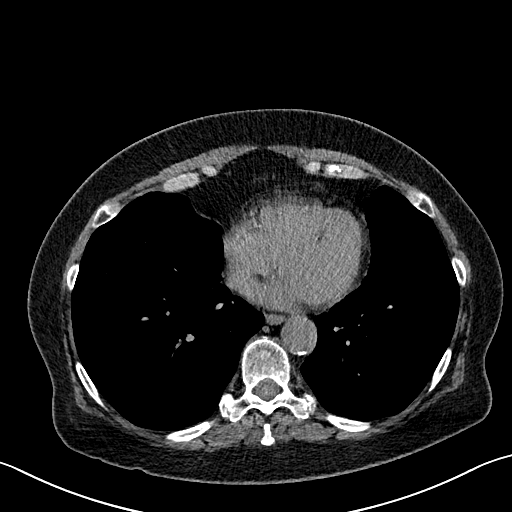
[im 70/188  lung]
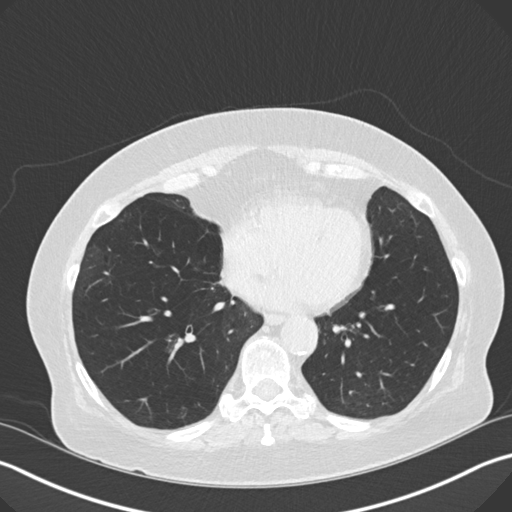
[im 84/188  lung]
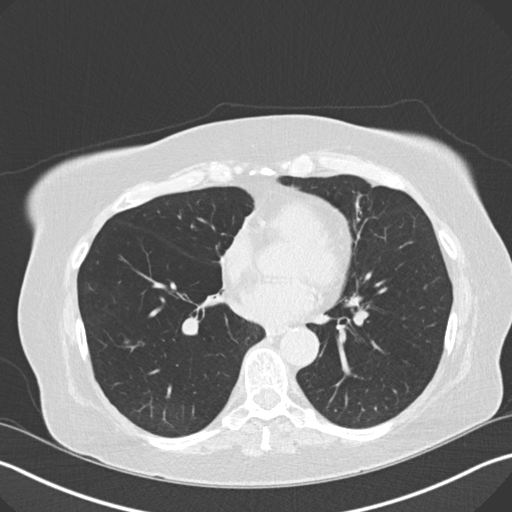
[im 104/188  lung]
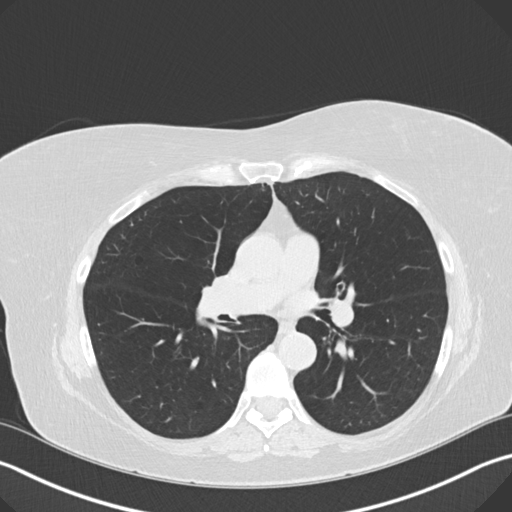
[im 118/188  lung]
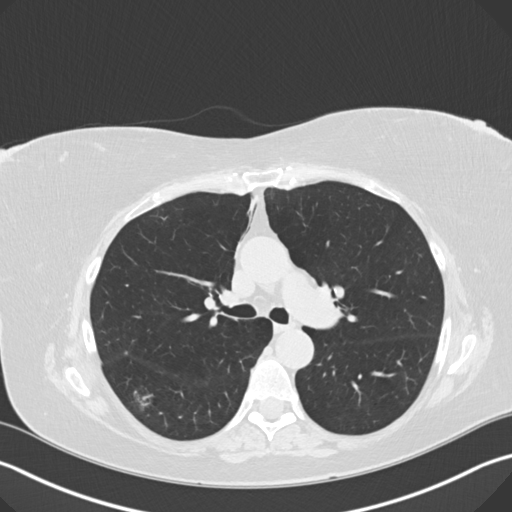
[im 132/188  mediastinal]
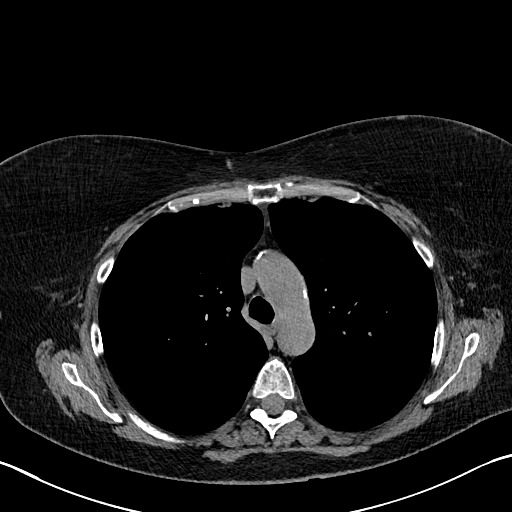
[im 132/188  lung]
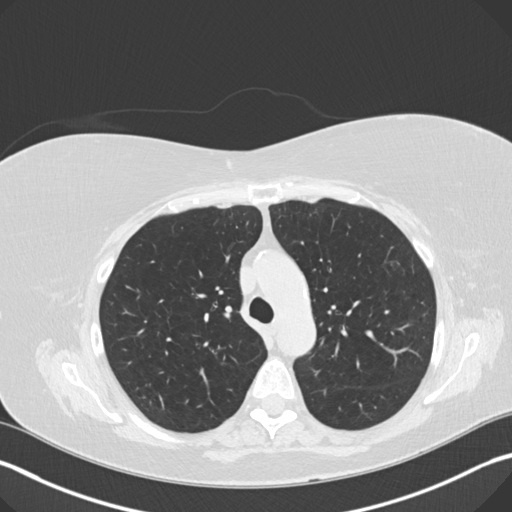
[im 146/188  lung]
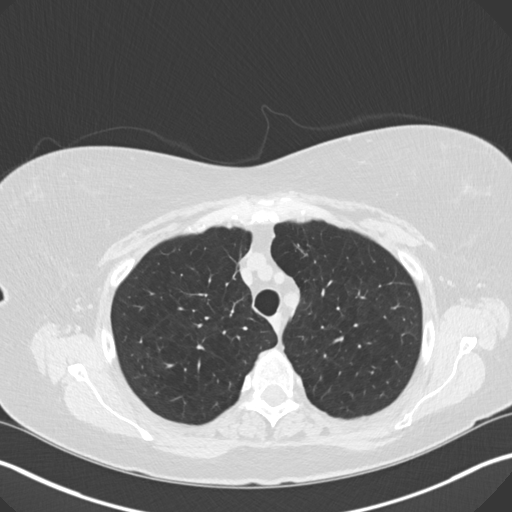
[im 160/188  lung]
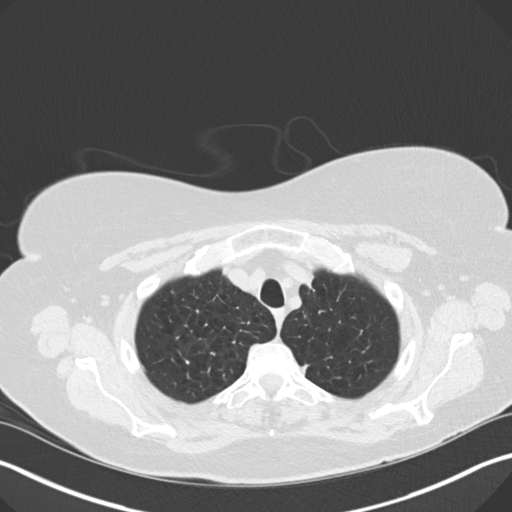
[im 174/188  lung]
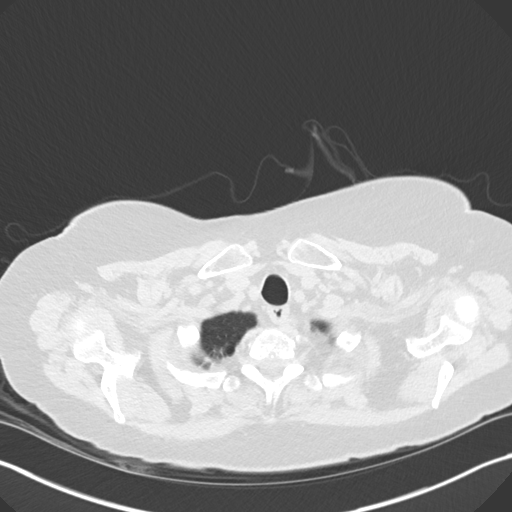

[Series 5: coronal · coronal · 0.64mm/px · 3 of 119 slices shown]
[im 24/119  lung]
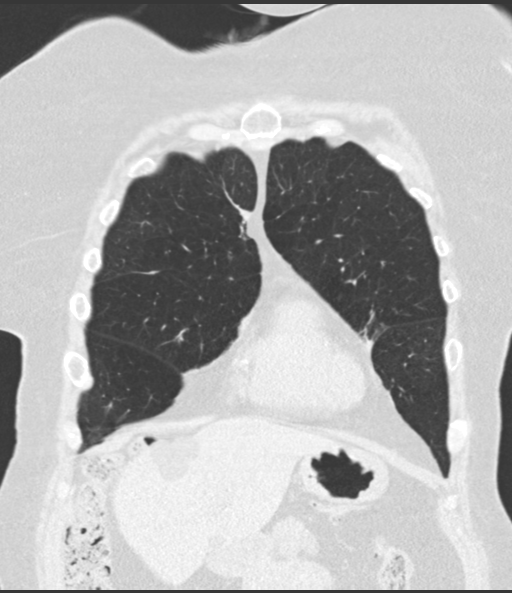
[im 48/119  lung]
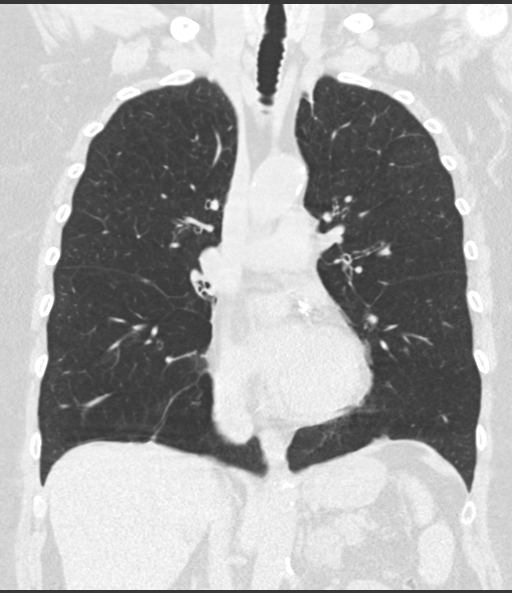
[im 71/119  lung]
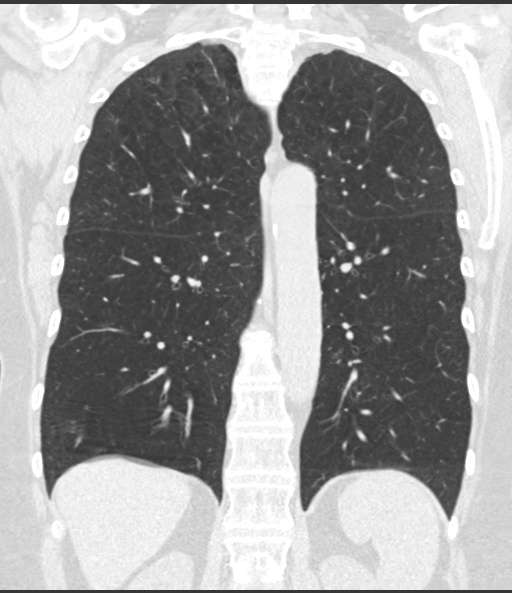

[15 of 36 positions shown; findings below may reference images not displayed]

FINDINGS: Cardiovascular: No acute findings. Aortic and coronary artery
atherosclerosis.

Mediastinum/Nodes: No masses or pathologically enlarged lymph nodes
identified on this unenhanced exam.

Lungs/Pleura: Moderate centrilobular emphysema again noted. Stable
scarring in the inferior lingula. No evidence of pulmonary
infiltrate or pleural effusion.

Ill-defined pulmonary nodule in the posterolateral right upper lobe
has resolved since previous study. A persistent nodule in the
posterior right lower lobe abutting the major fissure measures 9 x
10 mm on image 67/3, and has not significantly changed since
previous study. A new focal ground-glass opacity is seen in the
superior segment of the left lower lobe measuring 13 mm on image
72/3. This is most likely inflammatory or infectious in etiology. No
other suspicious pulmonary nodules or masses identified.

Upper Abdomen: Benign left adrenal adenoma and multiple fluid
attenuation hepatic cysts show no significant change.

Musculoskeletal:  No suspicious bone lesions.
IMPRESSION: Resolution of 1 right upper lobe pulmonary nodule, and other 10 mm
right upper lobe pulmonary nodule remains stable. New focal
ground-glass opacity in the superior segment of the right lower
lobe. These findings are suggestive of inflammatory or infectious
etiology; continued follow-up by chest CT without contrast is
recommended in 6 months.

Aortic Atherosclerosis (95XJD-32D.D) and Emphysema (95XJD-OJ9.U).
Coronary artery calcification.

## 2019-07-20 DIAGNOSIS — D4100 Neoplasm of uncertain behavior of unspecified kidney: Secondary | ICD-10-CM | POA: Diagnosis not present

## 2019-07-20 DIAGNOSIS — I10 Essential (primary) hypertension: Secondary | ICD-10-CM | POA: Diagnosis not present

## 2019-07-20 DIAGNOSIS — J449 Chronic obstructive pulmonary disease, unspecified: Secondary | ICD-10-CM | POA: Diagnosis not present

## 2019-07-20 DIAGNOSIS — E7800 Pure hypercholesterolemia, unspecified: Secondary | ICD-10-CM | POA: Diagnosis not present

## 2019-07-20 DIAGNOSIS — E782 Mixed hyperlipidemia: Secondary | ICD-10-CM | POA: Diagnosis not present

## 2019-07-31 DIAGNOSIS — J449 Chronic obstructive pulmonary disease, unspecified: Secondary | ICD-10-CM | POA: Diagnosis not present

## 2019-08-15 ENCOUNTER — Telehealth: Payer: Self-pay | Admitting: Internal Medicine

## 2019-08-15 DIAGNOSIS — R918 Other nonspecific abnormal finding of lung field: Secondary | ICD-10-CM

## 2019-08-15 NOTE — Telephone Encounter (Signed)
Spoke with pt, states she would like CT chest to be completed prior to 09/28/2019 OV with MR.    CT has not yet been ordered, but pt was due for a CT chest 06/2018 per last OV note (01/14/2018).  MR please advise what type of CT should be ordered for pt.  Thanks!

## 2019-08-16 DIAGNOSIS — D4100 Neoplasm of uncertain behavior of unspecified kidney: Secondary | ICD-10-CM | POA: Diagnosis not present

## 2019-08-16 DIAGNOSIS — E782 Mixed hyperlipidemia: Secondary | ICD-10-CM | POA: Diagnosis not present

## 2019-08-16 DIAGNOSIS — E7800 Pure hypercholesterolemia, unspecified: Secondary | ICD-10-CM | POA: Diagnosis not present

## 2019-08-16 DIAGNOSIS — I10 Essential (primary) hypertension: Secondary | ICD-10-CM | POA: Diagnosis not present

## 2019-08-16 DIAGNOSIS — J449 Chronic obstructive pulmonary disease, unspecified: Secondary | ICD-10-CM | POA: Diagnosis not present

## 2019-08-16 DIAGNOSIS — J441 Chronic obstructive pulmonary disease with (acute) exacerbation: Secondary | ICD-10-CM | POA: Diagnosis not present

## 2019-08-22 NOTE — Telephone Encounter (Signed)
MR please advise 

## 2019-08-23 DIAGNOSIS — Z961 Presence of intraocular lens: Secondary | ICD-10-CM | POA: Diagnosis not present

## 2019-08-25 NOTE — Telephone Encounter (Signed)
  THe CT beliw was in June 2019 and shows 87mm RUL nodule. The followup CT for this can be without contrast and at time frame the patient Brandy Morales desiures     IMPRESSION: Resolution of 1 right upper lobe pulmonary nodule, and other 10 mm right upper lobe pulmonary nodule remains stable. New focal ground-glass opacity in the superior segment of the right lower lobe. These findings are suggestive of inflammatory or infectious etiology; continued follow-up by chest CT without contrast is recommended in 6 months.  Aortic Atherosclerosis (ICD10

## 2019-08-25 NOTE — Telephone Encounter (Signed)
Order has been placed for the CT and made a comment that it needs to be completed prior to appt 5/5. Nothing further needed.

## 2019-08-31 DIAGNOSIS — J449 Chronic obstructive pulmonary disease, unspecified: Secondary | ICD-10-CM | POA: Diagnosis not present

## 2019-09-06 ENCOUNTER — Inpatient Hospital Stay: Admission: RE | Admit: 2019-09-06 | Payer: PPO | Source: Ambulatory Visit

## 2019-09-06 DIAGNOSIS — E7800 Pure hypercholesterolemia, unspecified: Secondary | ICD-10-CM | POA: Diagnosis not present

## 2019-09-06 DIAGNOSIS — Z79899 Other long term (current) drug therapy: Secondary | ICD-10-CM | POA: Diagnosis not present

## 2019-09-06 DIAGNOSIS — I10 Essential (primary) hypertension: Secondary | ICD-10-CM | POA: Diagnosis not present

## 2019-09-06 DIAGNOSIS — J309 Allergic rhinitis, unspecified: Secondary | ICD-10-CM | POA: Diagnosis not present

## 2019-09-06 DIAGNOSIS — J449 Chronic obstructive pulmonary disease, unspecified: Secondary | ICD-10-CM | POA: Diagnosis not present

## 2019-09-06 DIAGNOSIS — J9611 Chronic respiratory failure with hypoxia: Secondary | ICD-10-CM | POA: Diagnosis not present

## 2019-09-08 ENCOUNTER — Inpatient Hospital Stay: Admission: RE | Admit: 2019-09-08 | Payer: PPO | Source: Ambulatory Visit

## 2019-09-14 ENCOUNTER — Ambulatory Visit (INDEPENDENT_AMBULATORY_CARE_PROVIDER_SITE_OTHER)
Admission: RE | Admit: 2019-09-14 | Discharge: 2019-09-14 | Disposition: A | Discharge status: Home / Self Care | Payer: PPO | Source: Ambulatory Visit | Attending: Internal Medicine | Admitting: Internal Medicine

## 2019-09-14 ENCOUNTER — Other Ambulatory Visit: Payer: Self-pay

## 2019-09-14 DIAGNOSIS — R918 Other nonspecific abnormal finding of lung field: Secondary | ICD-10-CM | POA: Diagnosis not present

## 2019-09-21 DIAGNOSIS — D4100 Neoplasm of uncertain behavior of unspecified kidney: Secondary | ICD-10-CM | POA: Diagnosis not present

## 2019-09-21 DIAGNOSIS — J449 Chronic obstructive pulmonary disease, unspecified: Secondary | ICD-10-CM | POA: Diagnosis not present

## 2019-09-21 DIAGNOSIS — I10 Essential (primary) hypertension: Secondary | ICD-10-CM | POA: Diagnosis not present

## 2019-09-21 DIAGNOSIS — E7800 Pure hypercholesterolemia, unspecified: Secondary | ICD-10-CM | POA: Diagnosis not present

## 2019-09-21 DIAGNOSIS — J441 Chronic obstructive pulmonary disease with (acute) exacerbation: Secondary | ICD-10-CM | POA: Diagnosis not present

## 2019-09-21 DIAGNOSIS — E782 Mixed hyperlipidemia: Secondary | ICD-10-CM | POA: Diagnosis not present

## 2019-09-28 ENCOUNTER — Ambulatory Visit: Payer: PPO | Admitting: Internal Medicine

## 2019-09-28 ENCOUNTER — Encounter: Payer: Self-pay | Admitting: Internal Medicine

## 2019-09-28 ENCOUNTER — Other Ambulatory Visit: Payer: Self-pay

## 2019-09-28 VITALS — BP 120/80 | HR 102 | Temp 97.4°F | Ht 64.0 in | Wt 174.6 lb

## 2019-09-28 DIAGNOSIS — R911 Solitary pulmonary nodule: Secondary | ICD-10-CM | POA: Diagnosis not present

## 2019-09-28 DIAGNOSIS — J9611 Chronic respiratory failure with hypoxia: Secondary | ICD-10-CM | POA: Diagnosis not present

## 2019-09-28 DIAGNOSIS — J449 Chronic obstructive pulmonary disease, unspecified: Secondary | ICD-10-CM

## 2019-09-28 NOTE — Addendum Note (Signed)
Addended by: Vanessa Barbara on: 09/28/2019 03:11 PM   Modules accepted: Orders

## 2019-09-28 NOTE — Addendum Note (Signed)
Addended by: Vanessa Barbara on: 09/28/2019 12:06 PM   Modules accepted: Orders

## 2019-09-28 NOTE — Patient Instructions (Signed)
Solitary pulmonary nodule - RUL 1.5cm April 2021 and enlarged since 2019  Plan  - [facility] blood work  = PET scan next 1- 2 weeks    Stage 4 very severe COPD by GOLD classification ([place]) Chronic respiratory failure with hypoxia ([place])  - stable on o2 and inhalers without flareup  Plan  -  spirivan daily  - breo daily  -02 daily  - flonase daily - xoopenex as needed  Followup  - video or tele visit or fac to face with Eric Form or Dr Chase Caller to discuss results -   - needs to be < 2 weeks from  09/28/2019 but after PET

## 2019-09-28 NOTE — Progress Notes (Signed)
IOV 03/18/2017  Chief Complaint  Patient presents with  . Pulm Consult    Pt referred by Dr. Inda Merlin MD for respiratory failure. Pt has SOB and wheezing with exertion. Pt on 2 liters on O2 [facility], been on O2 for two years. Pt fingers stays purple color and warm and feet stay cold and turn purple.     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 Dr. Inda Merlin is a primary care physician. She tells me that for the last 2 years she's been on chronic oxygen therapy. She uses the oxygen only on demand in the daytime based on subjective symptoms of dyspnea on exertion relieved by rest. Oxygen definitely helps her. She occasionally monitors her pulse ox and it would dip to 86% with exertion. She does not use the oxygen at night because she does not feel dyspneic atight. Is only occasional cough or wheezing. She has a previous 110 pack smoking history. She is not sure of the diagnosis COPD of pulmonary fibrosis but she reckons that his COPD based on the smoking history and previous history of wheezing and based on some other physician comments. Overall she's stable but at this point she wanted to see a pulmonologist there for she's been referred. Previously she was not interested. She does not recollect any pulmonary function tests a CT scan of the chest. She recollects that office chest x-ray to be abnormal. She is not on any inhaler therapy and this is because the oxygen therapy helped her.  In addition she does give a history of RaynaudThere is no prior history of autoimmune disease. Denies any pulmonary fibrosis history  Walking desaturation test 185 feet 3 laps on room air with a full head probe : Resting heart the resting pulse ox was 96% with a heart rate of 81/m.she walk with a full head probe. By the second lap she started wheezing significantly. Breath and became short of breath. And tok a break , and at the second lap and the pulse ox  was 89% with a heart rate of 101. At the third lap pulse ox was 90% with a heart rate of 99. She took 2 breaks. The only mild desaturation.   OV 05/14/2017  Chief Complaint  Patient presents with  . Follow-up    PFT done today, HRCT 03/30/17, PET scan done 12/17.  Pt states she has been doing good since last visit.  DME: [facility], 2L O2 pulse when out and 2L continuous at home.   Fu Raynaud Fu chronic hypoxemic respp failure  Here to discuss test result for above. Autoimmune panel negative. 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 Had 2 nodules - so we did PET Scan a month later in dec 2018 and nodules shrinking. 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 She is asymptomatic from this but has prior hx. OVerall resp status is stable and she is using 2-3L [place].   OV 08/14/2017  Chief Complaint  Patient presents with  . Follow-up    Pt stated she became sick x6 weeks ago and thought it could have been the flu.  Pt states she has had good and bad days. Pt has some congestion, stable SOB. Denies any CP/tightness.    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  Overall she is stable.  Some 6 weeks ago she picked up symptoms  of a COPD exacerbation.  Saw primary care physician Dr. Inda Merlin.  Given antibiotics and prednisone but she only took the prednisone.  Did not take the antibiotics.  She is improved but still feels a little bit hoarse in her chest and has some cough that is not back at baseline yet.  COPD CAT score is 18.  At this point in time she is not interested in antibiotic or another course of prednisone and.  She feels she is naturally improving.  She asked about pulmonary rehabilitation but given the inconvenience it poses of parking at the hospital and having to use a wheelchair to get into rehab at this point in time she wants to hold off.  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  Of note she did see urology for her stones by the nurse practitioner.  She is going to see the primary MD urologist.  She gets the feeling that she might be considered a high risk for surgery given her advanced COPD.   OV 01/14/2018  Chief Complaint  Patient presents with  . Follow-up    Pt states she has been doing well since last visit. Denies any current complaints or concerns.    Brandy Morales - Follow-up of Gold stage IV COPD. She is on Spiriva and Brio and 2 L of oxygen. Overall stable. COPD cat score is unchangedas seen below. She had admission in July for urologic issues but she is over it now. She is trying to walk and get more reconditioned. The heat and humidity is posing a challenge. She is also having difficulty with co-pay of her inhalers.she's not interested in overnight BiPAP and therefore does not want blood gas tested.  Follow-up lung nodules: She had CT scan of the chest June 2019. She has a new groundglass opacity right lower lobe superior segment.   IMPRESSION: Resolution of 1 right upper lobe pulmonary nodule, and other 10 mm right upper lobe pulmonary nodule remains stable. New focal ground-glass opacity in the superior segment of the right lower lobe. These findings are suggestive of inflammatory or infectious etiology; continued follow-up by chest CT without contrast is recommended in 6 months.  Aortic Atherosclerosis (ICD10-I70.0) and Emphysema (ICD10-J43.9). Coronary artery calcification.   Electronically Signed   By: Earle Gell M.D.   On: 11/05/2017 14:05   OV 09/28/2019  Subjective:  Patient ID: Brandy Morales, female , DOB: 01-12-1946 , age 74 y.o. , MRN: [id number] , ADDRESS: [address] [place] [address]   09/28/2019 -   Chief Complaint  Patient presents with  . Follow-up    sob with exertion.  no problems sleeping or sitting still.  gets sob carrying on a conversation.,   Gold stage IV COPD with  chronic hypoxic respiratory failure follow-up Right upper lobe pulmonary nodule follow-up  HPI Brandy Morales 74 y.o. -presents for follow-up.  Last seen by myself in August 2019.  After that because of the pandemic she has been isolating.  She is compliant with her Breo Spiriva and oxygen with Xopenex as needed.  Also uses a Flonase.  COPD itself is stable without flareup.  CAT score is 5 today.  We arrange for a CT scan of the chest as a follow-up to the 2019 CT chest.  Her right upper lobe groundglass nodule is larger now.  1.5 cm.  Previously it was stable.  She not having any hemoptysis or weight loss.  In the past she has  had a PET scan.  Radiologist recommending another PET scan.  She does not have a prior history of cancer.  She is afraid of radiation and chemotherapy.  However we discussed this.  She has gold stage IV COPD and is probably not surgery eligible.   CAT Score 09/28/2019  Total CAT Score 5      CAT COPD Symptom & Quality of Life Score (GSK trademark) 0 is no burden. 5 is highest burden 08/14/2017  01/14/2018   Never Cough -> Cough all the time 2 1  No phlegm in chest -> Chest is full of phlegm 2 3  No chest tightness -> Chest feels very tight 0 0  No dyspnea for 1 flight stairs/hill -> Very dyspneic for 1 flight of stairs 3 5  No limitations for ADL at home -> Very limited with ADL at home 4 3  Confident leaving home -> Not at all confident leaving home 4 2.5  Sleep soundly -> Do not sleep soundly because of lung condition 4 0  Lots of Energy -> No energy at all 3 4  TOTAL Score (max 40)  18 18.5   Results for WAKEELAH, SOLAN (MRN [id number]) as of 08/14/2017 16:57  Ref. Range 05/14/2017 10:30  FEV1-Pre Latest Units: L 0.63  FEV1-%Pred-Pre Latest Units: % 26  Pre FEV1/FVC ratio Latest Units: % 42  Results for MACHAELA, CATERINO (MRN [id number]) as of 08/14/2017 16:57  Ref. Range 05/14/2017 10:30  DLCO cor Latest Units: ml/min/mmHg 9.09  DLCO cor % pred   Latest Units: %  35   IMPRESSION: 1. Irregular predominantly solid 1.5 cm posterior right upper lobe pulmonary nodule, increased in size and density since 11/05/2017 chest CT, suspicious for primary bronchogenic carcinoma. Multidisciplinary thoracic oncology consultation advised. Repeat PET-CT could be considered. 2. Two additional subcentimeter solid pulmonary nodules in the right upper and left lower lobes, below PET resolution, new since 11/05/2017 chest CT. Close chest CT surveillance recommended for these nodules. 3. No thoracic adenopathy. 4. Three-vessel coronary atherosclerosis. 5. Stable left adrenal adenoma. 6. Aortic Atherosclerosis (ICD10-I70.0) and Emphysema (ICD10-J43.9).   Electronically Signed   By: Ilona Sorrel M.D.   On: 09/14/2019 14:07   ROS - per HPI     has a past medical history of Anxiety, Asthma, COPD (chronic obstructive pulmonary disease) ([place]), GERD (gastroesophageal reflux disease), History of kidney stones, and Hypertension.   reports that she quit smoking about 5 years ago. Her smoking use included cigarettes. She has a 110.00 pack-year smoking history. She has never used smokeless tobacco.  Past Surgical History:  Procedure Laterality Date  . ABDOMINAL HYSTERECTOMY  age 8   partial 1 ovary left  . APPENDECTOMY    . colonscopy    . CYSTOSCOPY WITH RETROGRADE PYELOGRAM, URETEROSCOPY AND STENT PLACEMENT N/A 11/30/2017   Procedure: CYSTOSCOPY WITH BILATERAL RETROGRADE PYELOGRAM, LEFT URETEROSCOPY/ LEFT LASER LITHO AND LEFT STENT PLACEMENT;  Surgeon: Lucas Mallow, MD;  Location: [facility];  Service: Urology;  Laterality: N/A;  . CYSTOSCOPY WITH RETROGRADE PYELOGRAM, URETEROSCOPY AND STENT PLACEMENT Left 02/01/2018   Procedure: CYSTOSCOPY WITH LEFT RETROGRADE PYELOGRAM, URETEROSCOPY HOLMIUM LASER AND STENT PLACEMENT;  Surgeon: Lucas Mallow, MD;  Location: [facility];  Service: Urology;  Laterality: Left;  . EYE SURGERY Bilateral    Cataract  . TONSILLECTOMY       Allergies  Allergen Reactions  . Statins Cough  . Latex Other (See Comments)    Broke out on face    Immunization  History  Administered Date(s) Administered  . Influenza Split 03/03/2016, 03/11/2017  . Influenza, High Dose Seasonal PF 02/23/2019  . PFIZER SARS-COV-2 Vaccination 06/14/2019, 07/05/2019  . Pneumococcal Conjugate-13 01/27/2014, 05/26/2019  . Pneumococcal Polysaccharide-23 07/02/2008    No family history on file.   Current Outpatient Medications:  .  acyclovir (ZOVIRAX) 400 MG tablet, Take 400 mg by mouth 3 (three) times daily as needed (for outbreak). , Disp: , Rfl:  .  albuterol (PROVENTIL HFA;VENTOLIN HFA) 108 (90 Base) MCG/ACT inhaler, Inhale 2 puffs into the lungs every 6 (six) hours as needed for wheezing or shortness of breath., Disp: 1 Inhaler, Rfl: 6 .  Artificial Tear Solution (GENTEAL TEARS OP), Place 1 drop into both eyes daily. , Disp: , Rfl:  .  Cholecalciferol (VITAMIN D3) 5000 units CAPS, Take 5,000 Units by mouth daily., Disp: , Rfl:  .  citalopram (CELEXA) 20 MG tablet, Take 20 mg by mouth daily., Disp: , Rfl:  .  COENZYME Q10 PO, Take 10 mLs by mouth daily. , Disp: , Rfl:  .  fexofenadine (ALLEGRA) 180 MG tablet, Take 180 mg by mouth daily., Disp: , Rfl:  .  fluticasone (FLONASE) 50 MCG/ACT nasal spray, Place 1 spray into both nostrils daily. , Disp: , Rfl:  .  levalbuterol (XOPENEX) 1.25 MG/0.5ML nebulizer solution, Take 1.25 mg by nebulization every 4 (four) hours as needed for wheezing or shortness of breath., Disp: , Rfl:  .  losartan-hydrochlorothiazide (HYZAAR) 100-25 MG tablet, Take 0.5 tablets by mouth daily., Disp: , Rfl:  .  Multiple Vitamins-Minerals (MULTIVITAMIN PO), Take 6 each by mouth daily. , Disp: , Rfl:  .  naproxen sodium (ALEVE) 220 MG tablet, Take 220 mg by mouth daily., Disp: , Rfl:  .  omeprazole (PRILOSEC) 20 MG capsule, Take 20 mg by mouth daily. , Disp: , Rfl:  .  OXYGEN, Inhale 2 L into the lungs continuous. , Disp: ,  Rfl:  .  tiotropium (SPIRIVA) 18 MCG inhalation capsule, Place 1 capsule (18 mcg total) into inhaler and inhale daily., Disp: 30 capsule, Rfl: 6 .  BREO ELLIPTA 200-25 MCG/INH AEPB, Inhale 1 puff into the lungs daily., Disp: , Rfl:       Objective:   Vitals:   09/28/19 1108  BP: 120/80  Pulse: (!) 102  Temp: (!) 97.4 F (36.3 C)  TempSrc: Temporal  SpO2: 96%  Weight: 174 lb 9.6 oz (79.2 kg)  Height: 5\' 4"  (1.626 m)    Estimated body mass index is 29.97 kg/m as calculated from the following:   Height as of this encounter: 5\' 4"  (1.626 m).   Weight as of this encounter: 174 lb 9.6 oz (79.2 kg).  @WEIGHTCHANGE @  Filed Weights   09/28/19 1108  Weight: 174 lb 9.6 oz (79.2 kg)     Physical Exam Alert and oriented x3.  Normal oral cavity without any thrush.  Oxygen on.  Barrel chest with baseline pursed lip breathing.  Pleasant.  Mild venous edema in the feet.  Varicose veins present.  No wheezing no murmurs.  No crackles.        Assessment:       ICD-10-CM   1. Solitary pulmonary nodule  R91.1   2. Stage 4 very severe COPD by GOLD classification ([place])  J44.9   3. Chronic respiratory failure with hypoxia ([facility])  J96.11        Plan:     Patient Instructions  Solitary pulmonary nodule - RUL 1.5cm April 2021 and enlarged  since 2019  Plan  - [facility] blood work  = PET scan next 1- 2 weeks    Stage 4 very severe COPD by GOLD classification ([place]) Chronic respiratory failure with hypoxia ([place])  - stable on o2 and inhalers without flareup  Plan  -  spirivan daily  - breo daily  -02 daily  - flonase daily - xoopenex as needed  Followup  - video or tele visit or fac to face with Eric Form or Dr Chase Caller to discuss results -   - needs to be < 2 weeks from  09/28/2019 but after PET     SIGNATURE    Dr. Brand Males, M.D., F.C.C.P,  Pulmonary and Critical Care Medicine Staff Physician, [facility] Director - Interstitial Lung Disease   Program  [facility] [place], [place], [address]  Pager: [phone], If no answer or between  15:00h - 7:00h: call [phone] Telephone: [phone]  11:39 AM 09/28/2019

## 2019-09-30 DIAGNOSIS — J449 Chronic obstructive pulmonary disease, unspecified: Secondary | ICD-10-CM | POA: Diagnosis not present

## 2019-10-12 ENCOUNTER — Other Ambulatory Visit: Payer: Self-pay

## 2019-10-12 ENCOUNTER — Telehealth: Payer: Self-pay | Admitting: Internal Medicine

## 2019-10-12 ENCOUNTER — Encounter (HOSPITAL_COMMUNITY)
Admission: RE | Admit: 2019-10-12 | Discharge: 2019-10-12 | Disposition: A | Discharge status: Home / Self Care | Payer: PPO | Source: Ambulatory Visit | Attending: Internal Medicine | Admitting: Internal Medicine

## 2019-10-12 DIAGNOSIS — R911 Solitary pulmonary nodule: Secondary | ICD-10-CM

## 2019-10-12 DIAGNOSIS — I2510 Atherosclerotic heart disease of native coronary artery without angina pectoris: Secondary | ICD-10-CM | POA: Insufficient documentation

## 2019-10-12 DIAGNOSIS — J439 Emphysema, unspecified: Secondary | ICD-10-CM | POA: Insufficient documentation

## 2019-10-12 DIAGNOSIS — R918 Other nonspecific abnormal finding of lung field: Secondary | ICD-10-CM | POA: Insufficient documentation

## 2019-10-12 DIAGNOSIS — I700 Atherosclerosis of aorta: Secondary | ICD-10-CM | POA: Insufficient documentation

## 2019-10-12 LAB — GLUCOSE, CAPILLARY: Glucose-Capillary: 81 mg/dL (ref 70–99)

## 2019-10-12 MED ORDER — FLUDEOXYGLUCOSE F - 18 (FDG) INJECTION
8.7000 | Freq: Once | INTRAVENOUS | Status: AC
  Administered 2019-10-12: 8.7 via INTRAVENOUS

## 2019-10-12 NOTE — Telephone Encounter (Signed)
  The RUL nodule is PET hot -> and concerning nodule might be early stage lung ca  Plan  - she needs to see [facility] or [name] asap - cancel appt iwht me 10/31/19 - likeluy needs SUPER D of the most recent CT chest  NM PET Image Restag (PS) Skull Base To Thigh  Result Date: 10/12/2019 CLINICAL DATA:  Initial treatment strategy for solitary pulmonary nodule. EXAM: NUCLEAR MEDICINE PET SKULL BASE TO THIGH TECHNIQUE: 8.7 mCi F-18 FDG was injected intravenously. Full-ring PET imaging was performed from the skull base to thigh after the radiotracer. CT data was obtained and used for attenuation correction and anatomic localization. Fasting blood glucose: 81 mg/dl COMPARISON:  CT chest 09/14/2019 and PET-CT 05/11/2017 FINDINGS: Mediastinal blood pool activity: SUV max 2.7 Liver activity: SUV max NA NECK: No hypermetabolic lymph nodes in the neck. Incidental CT findings: none CHEST: No FDG avid lymph nodes within the chest. 1.1 cm nodule within the posterolateral right upper lobe has an SUV max of 5.45, image 35/8. Smaller right upper lobe lung nodule measures 4 mm within SUV max 1.14, image 19/8. This is new when compared with 05/11/2017. Incidental CT findings: Moderate centrilobular and paraseptal emphysema. Aortic atherosclerosis. Three vessel coronary artery calcifications. ABDOMEN/PELVIS: No abnormal hypermetabolic activity within the liver, pancreas, adrenal glands, or spleen. No hypermetabolic lymph nodes in the abdomen or pelvis. Incidental CT findings: Scattered liver cysts. Left adrenal nodule measures 2.5 cm and is unchanged from 05/11/2017 compatible with benign adenoma. Aortic atherosclerosis. Bilateral kidney stones. Dystrophic calcifications associated with focal area of scarring involving the anterior cortex of the right kidney. SKELETON: No focal hypermetabolic activity to suggest skeletal metastasis. Incidental CT findings: none IMPRESSION: 1. The enlarging nodule within the posterior right upper lobe  is FDG avid within SUV max of 5.45 worrisome for primary bronchogenic carcinoma. 2. Smaller nodule within the right upper lobe is technically too small to reliably characterize by PET-CT at 4 mm. This has an SUV max of 1.14 but is new when compared with 11/05/2017. 3. No findings of FDG avid nodal metastasis within the chest or distant metastatic disease. 4. Aortic atherosclerosis, in addition to 3 vessel coronary artery disease. Please note that although the presence of coronary artery calcium documents the presence of coronary artery disease, the severity of this disease and any potential stenosis cannot be assessed on this non-gated CT examination. Assessment for potential risk factor modification, dietary therapy or pharmacologic therapy may be warranted, if clinically indicated. 5. Aortic Atherosclerosis (ICD10-I70.0) and Emphysema (ICD10-J43.9). Electronically Signed   By: Kerby Moors M.D.   On: 10/12/2019 10:11

## 2019-10-12 NOTE — Telephone Encounter (Signed)
Dr. Valeta Harms you are fully booked on the 9th, double book? If so when? And if not what alternate date? Or keep with MR on 6/7  Dr. Chase Caller is that super D with or without contrast? Please clarify then we can place order

## 2019-10-12 NOTE — Telephone Encounter (Signed)
Please schedule at 3:30 Thanks Garner Nash, DO [facility] 10/12/2019 5:34 PM

## 2019-10-12 NOTE — Telephone Encounter (Signed)
Ok to schedule in lung nodule slot with me on June 9th Please get super d first  Thanks Garner Nash, [facility] 10/12/2019 5:01 PM

## 2019-10-13 NOTE — Telephone Encounter (Signed)
Called and spoke with patient. She is waiting results for her PET scan that she had 10/12/19.   MR please advise  Patient is scheduled for June 9 at 3:30 Patient order for super D placed will get if can't switch the previous CT to a Super D Needs to be scheduled before follow up on June 9th

## 2019-10-13 NOTE — Telephone Encounter (Signed)
There was a CT chest done 09/14/19 -> please see if that can be convered to Super D and given to Dr Valeta Harms

## 2019-10-13 NOTE — Telephone Encounter (Signed)
Patient called wanting to speak to Dr. Chase Caller regarding CT results.  Please advise.  [phone] or [phone]

## 2019-10-13 NOTE — Telephone Encounter (Signed)
I will check on this.

## 2019-10-13 NOTE — Telephone Encounter (Addendum)
Brandy Morales states they can't burn disc now because CT already dumped.  Called [facility] & spoke to [place].  They can make disc.  She called me back to let me know disc has been burned and images are now available in canopy for viewing.

## 2019-10-13 NOTE — Telephone Encounter (Signed)
CT Chest without was done on 4/21 at [facility].  Davida to check with Lattie Haw and call me back to see if can be converted.

## 2019-10-14 DIAGNOSIS — E7800 Pure hypercholesterolemia, unspecified: Secondary | ICD-10-CM | POA: Diagnosis not present

## 2019-10-14 DIAGNOSIS — E782 Mixed hyperlipidemia: Secondary | ICD-10-CM | POA: Diagnosis not present

## 2019-10-14 DIAGNOSIS — J441 Chronic obstructive pulmonary disease with (acute) exacerbation: Secondary | ICD-10-CM | POA: Diagnosis not present

## 2019-10-14 DIAGNOSIS — J449 Chronic obstructive pulmonary disease, unspecified: Secondary | ICD-10-CM | POA: Diagnosis not present

## 2019-10-14 DIAGNOSIS — D4100 Neoplasm of uncertain behavior of unspecified kidney: Secondary | ICD-10-CM | POA: Diagnosis not present

## 2019-10-14 DIAGNOSIS — I10 Essential (primary) hypertension: Secondary | ICD-10-CM | POA: Diagnosis not present

## 2019-10-25 DIAGNOSIS — C801 Malignant (primary) neoplasm, unspecified: Secondary | ICD-10-CM

## 2019-10-25 HISTORY — DX: Malignant (primary) neoplasm, unspecified: C80.1

## 2019-10-31 ENCOUNTER — Ambulatory Visit: Payer: PPO | Admitting: Internal Medicine

## 2019-10-31 NOTE — Telephone Encounter (Signed)
Thanks for the referral Garner Nash, DO [facility] 10/31/2019 8:22 AM

## 2019-11-01 DIAGNOSIS — E7800 Pure hypercholesterolemia, unspecified: Secondary | ICD-10-CM | POA: Diagnosis not present

## 2019-11-01 DIAGNOSIS — J449 Chronic obstructive pulmonary disease, unspecified: Secondary | ICD-10-CM | POA: Diagnosis not present

## 2019-11-01 DIAGNOSIS — D4100 Neoplasm of uncertain behavior of unspecified kidney: Secondary | ICD-10-CM | POA: Diagnosis not present

## 2019-11-01 DIAGNOSIS — J441 Chronic obstructive pulmonary disease with (acute) exacerbation: Secondary | ICD-10-CM | POA: Diagnosis not present

## 2019-11-01 DIAGNOSIS — E782 Mixed hyperlipidemia: Secondary | ICD-10-CM | POA: Diagnosis not present

## 2019-11-01 DIAGNOSIS — I10 Essential (primary) hypertension: Secondary | ICD-10-CM | POA: Diagnosis not present

## 2019-11-02 ENCOUNTER — Encounter: Payer: Self-pay | Admitting: Pulmonary Disease

## 2019-11-02 ENCOUNTER — Other Ambulatory Visit: Payer: Self-pay

## 2019-11-02 ENCOUNTER — Ambulatory Visit: Payer: PPO | Admitting: Pulmonary Disease

## 2019-11-02 VITALS — BP 118/78 | HR 99 | Temp 97.3°F | Ht 66.0 in | Wt 176.6 lb

## 2019-11-02 DIAGNOSIS — J449 Chronic obstructive pulmonary disease, unspecified: Secondary | ICD-10-CM | POA: Diagnosis not present

## 2019-11-02 DIAGNOSIS — R942 Abnormal results of pulmonary function studies: Secondary | ICD-10-CM | POA: Diagnosis not present

## 2019-11-02 DIAGNOSIS — R911 Solitary pulmonary nodule: Secondary | ICD-10-CM

## 2019-11-02 DIAGNOSIS — J9611 Chronic respiratory failure with hypoxia: Secondary | ICD-10-CM | POA: Diagnosis not present

## 2019-11-02 NOTE — Patient Instructions (Signed)
Thank you for visiting Dr. Valeta Harms at [facility]. Today we recommend the following:  Please get SuperD Ct scheduled ASAP  Once CT complete we can discuss bronchoscopy planning  Please talk with family and let us know.   Return in about 6 weeks (around 12/14/2019) for with APP or Dr. Valeta Harms.    Please do your part to reduce the spread of COVID-19.

## 2019-11-02 NOTE — Progress Notes (Signed)
Synopsis: Referred in June 2021 for abnormal PET scan, Dr. Chase Caller, PCP: By [name], MD  Subjective:   PATIENT ID: Brandy Morales GENDER: female DOB: 1946-03-22, MRN: [id number]  Chief Complaint  Patient presents with   Consult    lung nodule    This is a 74 year old female past medical history of severe COPD, chronic oxygen dependent respiratory failure on 2 L pulse POC, asthma, hypertension.  Patient had CT scan imaging which revealed a right upper lobe pulmonary nodule abutting the major fissure.  Patient underwent nuclear medicine pet imaging which revealed a PET avid right upper lobe pulmonary nodule SUV 5.4.  Patient was referred for evaluation of navigational bronchoscopy and tissue sampling.  Patient felt to be not a good candidate for surgical resection due to poor lung function and functional status.  This was discussed with Dr. Chase Caller who is patient's primary pulmonologist.   Past Medical History:  Diagnosis Date   Anxiety    Asthma    COPD (chronic obstructive pulmonary disease) ([place])    GERD (gastroesophageal reflux disease)    History of kidney stones    Hypertension      History reviewed. No pertinent family history.   Past Surgical History:  Procedure Laterality Date   ABDOMINAL HYSTERECTOMY  age 29   partial 1 ovary left   APPENDECTOMY     colonscopy     CYSTOSCOPY WITH RETROGRADE PYELOGRAM, URETEROSCOPY AND STENT PLACEMENT N/A 11/30/2017   Procedure: CYSTOSCOPY WITH BILATERAL RETROGRADE PYELOGRAM, LEFT URETEROSCOPY/ LEFT LASER LITHO AND LEFT STENT PLACEMENT;  Surgeon: Lucas Mallow, MD;  Location: [facility];  Service: Urology;  Laterality: N/A;   CYSTOSCOPY WITH RETROGRADE PYELOGRAM, URETEROSCOPY AND STENT PLACEMENT Left 02/01/2018   Procedure: CYSTOSCOPY WITH LEFT RETROGRADE PYELOGRAM, URETEROSCOPY HOLMIUM LASER AND STENT PLACEMENT;  Surgeon: Lucas Mallow, MD;  Location: [facility];  Service: Urology;  Laterality: Left;   EYE SURGERY  Bilateral    Cataract   TONSILLECTOMY      Social History   Socioeconomic History   Marital status: Widowed    Spouse name: Not on file   Number of children: Not on file   Years of education: Not on file   Highest education level: Not on file  Occupational History   Not on file  Tobacco Use   Smoking status: Former Smoker    Packs/day: 2.00    Years: 55.00    Pack years: 110.00    Types: Cigarettes    Quit date: 2016    Years since quitting: 5.4   Smokeless tobacco: Never Used  Substance and Sexual Activity   Alcohol use: Yes    Alcohol/week: 1.0 standard drinks    Types: 1 Glasses of wine per week    Comment: weekly   Drug use: Never   Sexual activity: Not on file  Other Topics Concern   Not on file  Social History Narrative   Not on file   Social Determinants of Health   Financial Resource Strain:    Difficulty of Paying Living Expenses:   Food Insecurity:    Worried About Charity fundraiser in the Last Year:    Arboriculturist in the Last Year:   Transportation Needs:    Film/video editor (Medical):    Lack of Transportation (Non-Medical):   Physical Activity:    Days of Exercise per Week:    Minutes of Exercise per Session:   Stress:    Feeling of  Stress :   Social Connections:    Frequency of Communication with Friends and Family:    Frequency of Social Gatherings with Friends and Family:    Attends Religious Services:    Active Member of Clubs or Organizations:    Attends Archivist Meetings:    Marital Status:   Intimate Partner Violence:    Fear of Current or Ex-Partner:    Emotionally Abused:    Physically Abused:    Sexually Abused:      Allergies  Allergen Reactions   Statins Cough   Latex Other (See Comments)    Broke out on face     Outpatient Medications Prior to Visit  Medication Sig Dispense Refill   acyclovir (ZOVIRAX) 400 MG tablet Take 400 mg by mouth 3 (three) times daily as  needed (for outbreak).      Artificial Tear Solution (GENTEAL TEARS OP) Place 1 drop into both eyes daily.      BREO ELLIPTA 200-25 MCG/INH AEPB Inhale 1 puff into the lungs daily.     cetirizine (ZYRTEC) 10 MG tablet Take 10 mg by mouth daily.     Cholecalciferol (VITAMIN D3) 5000 units CAPS Take 5,000 Units by mouth daily.     citalopram (CELEXA) 20 MG tablet Take 20 mg by mouth daily.     COENZYME Q10 PO Take 10 mLs by mouth daily.      ezetimibe (ZETIA) 10 MG tablet Take 10 mg by mouth daily.     fexofenadine (ALLEGRA) 180 MG tablet Take 180 mg by mouth daily.     fluticasone (FLONASE) 50 MCG/ACT nasal spray Place 1 spray into both nostrils daily.      levalbuterol (XOPENEX) 1.25 MG/3ML nebulizer solution Use 1 vial in nebulizer up to every 8 hour as needed for difficulty breathing     losartan-hydrochlorothiazide (HYZAAR) 100-25 MG tablet Take 0.5 tablets by mouth daily.     Multiple Vitamins-Minerals (MULTIVITAMIN PO) Take 6 each by mouth daily.      naproxen sodium (ALEVE) 220 MG tablet Take 220 mg by mouth daily.     omeprazole (PRILOSEC) 20 MG capsule Take 20 mg by mouth daily.      OXYGEN Inhale 2 L into the lungs continuous.      tiotropium (SPIRIVA) 18 MCG inhalation capsule Place 1 capsule (18 mcg total) into inhaler and inhale daily. 30 capsule 6   albuterol (PROVENTIL HFA;VENTOLIN HFA) 108 (90 Base) MCG/ACT inhaler Inhale 2 puffs into the lungs every 6 (six) hours as needed for wheezing or shortness of breath. 1 Inhaler 6   levalbuterol (XOPENEX) 1.25 MG/0.5ML nebulizer solution Take 1.25 mg by nebulization every 4 (four) hours as needed for wheezing or shortness of breath.     No facility-administered medications prior to visit.    Review of Systems  Constitutional: Negative for chills, fever, malaise/fatigue and weight loss.  HENT: Negative for hearing loss, sore throat and tinnitus.   Eyes: Negative for blurred vision and double vision.  Respiratory:  Positive for shortness of breath. Negative for cough, hemoptysis, sputum production, wheezing and stridor.   Cardiovascular: Negative for chest pain, palpitations, orthopnea, leg swelling and PND.  Gastrointestinal: Negative for abdominal pain, constipation, diarrhea, heartburn, nausea and vomiting.  Genitourinary: Negative for dysuria, hematuria and urgency.  Musculoskeletal: Negative for joint pain and myalgias.  Skin: Negative for itching and rash.  Neurological: Negative for dizziness, tingling, weakness and headaches.  Endo/Heme/Allergies: Negative for environmental allergies. Does not bruise/bleed easily.  Psychiatric/Behavioral:  Negative for depression. The patient is not nervous/anxious and does not have insomnia.   All other systems reviewed and are negative.    Objective:  Physical Exam Vitals reviewed.  Constitutional:      General: She is not in acute distress.    Appearance: She is well-developed.  HENT:     Head: Normocephalic and atraumatic.  Eyes:     General: No scleral icterus.    Conjunctiva/sclera: Conjunctivae normal.     Pupils: Pupils are equal, round, and reactive to light.  Neck:     Vascular: No JVD.     Trachea: No tracheal deviation.  Cardiovascular:     Rate and Rhythm: Normal rate and regular rhythm.     Heart sounds: Normal heart sounds. No murmur.  Pulmonary:     Effort: Pulmonary effort is normal. No tachypnea, accessory muscle usage or respiratory distress.     Breath sounds: No stridor. No wheezing, rhonchi or rales.     Comments: Very diminished breath sounds bilaterally Abdominal:     General: Bowel sounds are normal. There is no distension.     Palpations: Abdomen is soft.     Tenderness: There is no abdominal tenderness.  Musculoskeletal:        General: No tenderness.     Cervical back: Neck supple.  Lymphadenopathy:     Cervical: No cervical adenopathy.  Skin:    General: Skin is warm and dry.     Capillary Refill: Capillary  refill takes less than 2 seconds.     Findings: No rash.  Neurological:     Mental Status: She is alert and oriented to person, place, and time.  Psychiatric:        Behavior: Behavior normal.      Vitals:   11/02/19 1550  BP: 118/78  Pulse: 99  Temp: (!) 97.3 F (36.3 C)  TempSrc: Oral  SpO2: 99%  Weight: 176 lb 9.6 oz (80.1 kg)  Height: 5\' 6"  (1.676 m)   99% on  RA BMI Readings from Last 3 Encounters:  11/02/19 28.50 kg/m  09/28/19 29.97 kg/m  02/01/18 30.90 kg/m   Wt Readings from Last 3 Encounters:  11/02/19 176 lb 9.6 oz (80.1 kg)  09/28/19 174 lb 9.6 oz (79.2 kg)  02/01/18 180 lb (81.6 kg)     CBC    Component Value Date/Time   WBC 6.8 02/01/2018 1226   RBC 5.11 02/01/2018 1226   HGB 14.9 02/01/2018 1226   HCT 45.8 02/01/2018 1226   PLT 157 02/01/2018 1226   MCV 89.6 02/01/2018 1226   MCH 29.2 02/01/2018 1226   MCHC 32.5 02/01/2018 1226   RDW 13.5 02/01/2018 1226     Chest Imaging: 09/14/2019 CT chest: Right upper lobe spiculated pulmonary nodule abutting major fissure. The patient's images have been independently reviewed by me.    10/12/2019 nuclear medicine pet imaging. Right upper lobe PET avid pulmonary nodule SUV 5.45, 11 mm in largest cross-section, mild tenting of the fissure line. Concerning for primary bronchogenic carcinoma. The patient's images have been independently reviewed by me.     Pulmonary Functions Testing Results: PFT Results Latest Ref Rng & Units 05/14/2017  FVC-Pre L 1.50  FVC-Predicted Pre % 48  Pre FEV1/FVC % % 42  FEV1-Pre L 0.63  FEV1-Predicted Pre % 26  DLCO UNC% % 37  DLCO COR %Predicted % 45  TLC L 9.98  TLC % Predicted % 192  RV % Predicted % 366  FeNO: none  Pathology: none  Echocardiogram: none  Heart Catheterization: none    Assessment & Plan:     ICD-10-CM   1. Nodule of right lung  R91.1   2. Abnormal PET of right lung  R94.2   3. Stage 4 very severe COPD by GOLD classification ([place])   J44.9   4. Chronic respiratory failure with hypoxia (H[facility])  J96.11     Discussion: This is a 74 year old female with advanced stage COPD chronic hypoxemic respiratory failure with a PET avid right upper lobe spiculated pulmonary nodule concerning for primary bronchogenic carcinoma.  Today in the office we reviewed patient's CT imaging as well as nuclear medicine pet imaging.  We also discussed the risk benefits and alternatives of proceeding with invasive tissue diagnosis to include video bronchoscopy with navigational tissue sampling.  Additionally we discussed further treatment options to include SBRT.  We would plan for navigational bronchoscopy with fiducial placement.  She would like to discuss this further with her daughters before making plans.  In the meantime she will need a super D CT of the chest to aid in planning for bronchoscopy.  Once we have this information we can make further recommendations.  If patient decides we will plan for outpatient bronchoscopy with tissue sampling and fiducial placement.  I do not believe she is a surgical candidate for stage I resection due to poor lung function. We also discussed risk of undergoing a general anesthesia for video bronchoscopy and navigation.  I do believe that she would tolerate this procedure.  We also discussed potential risk of the procedure to include pneumothorax and bleeding.  At this time she would like to know whether or not she has a malignancy.  We also discussed alternatives to include transthoracic needle aspiration by interventional radiology however due to the patient's severity of lung disease and associated emphysema as well as nodule position against the major fissure line that this is likely not the best option due to pneumothorax complication risk.  Recommendations discussed with Dr. Chase Caller.  I appreciate the consultation.  Greater than 50% of this patient's 45-minute office visit spent face-to-face discussing the  recommendations and treatment plan.   Current Outpatient Medications:    acyclovir (ZOVIRAX) 400 MG tablet, Take 400 mg by mouth 3 (three) times daily as needed (for outbreak). , Disp: , Rfl:    Artificial Tear Solution (GENTEAL TEARS OP), Place 1 drop into both eyes daily. , Disp: , Rfl:    BREO ELLIPTA 200-25 MCG/INH AEPB, Inhale 1 puff into the lungs daily., Disp: , Rfl:    cetirizine (ZYRTEC) 10 MG tablet, Take 10 mg by mouth daily., Disp: , Rfl:    Cholecalciferol (VITAMIN D3) 5000 units CAPS, Take 5,000 Units by mouth daily., Disp: , Rfl:    citalopram (CELEXA) 20 MG tablet, Take 20 mg by mouth daily., Disp: , Rfl:    COENZYME Q10 PO, Take 10 mLs by mouth daily. , Disp: , Rfl:    ezetimibe (ZETIA) 10 MG tablet, Take 10 mg by mouth daily., Disp: , Rfl:    fexofenadine (ALLEGRA) 180 MG tablet, Take 180 mg by mouth daily., Disp: , Rfl:    fluticasone (FLONASE) 50 MCG/ACT nasal spray, Place 1 spray into both nostrils daily. , Disp: , Rfl:    levalbuterol (XOPENEX) 1.25 MG/3ML nebulizer solution, Use 1 vial in nebulizer up to every 8 hour as needed for difficulty breathing, Disp: , Rfl:    losartan-hydrochlorothiazide (HYZAAR) 100-25 MG tablet, Take 0.5  tablets by mouth daily., Disp: , Rfl:    Multiple Vitamins-Minerals (MULTIVITAMIN PO), Take 6 each by mouth daily. , Disp: , Rfl:    naproxen sodium (ALEVE) 220 MG tablet, Take 220 mg by mouth daily., Disp: , Rfl:    omeprazole (PRILOSEC) 20 MG capsule, Take 20 mg by mouth daily. , Disp: , Rfl:    OXYGEN, Inhale 2 L into the lungs continuous. , Disp: , Rfl:    tiotropium (SPIRIVA) 18 MCG inhalation capsule, Place 1 capsule (18 mcg total) into inhaler and inhale daily., Disp: 30 capsule, Rfl: 6   Garner Nash, DO [facility] 11/02/2019 8:43 PM

## 2019-11-10 ENCOUNTER — Telehealth: Payer: Self-pay | Admitting: Pulmonary Disease

## 2019-11-10 DIAGNOSIS — R911 Solitary pulmonary nodule: Secondary | ICD-10-CM

## 2019-11-10 NOTE — Telephone Encounter (Signed)
Dr. Valeta Harms, please advise if you ever received disc of pt's scan or if we need to place order for new scan to be performed.

## 2019-11-10 NOTE — Telephone Encounter (Signed)
Order for super D was placed on 5/20 and order was canceled due to per Claiborne Billings at [facility] CT they were going to be able to use chest CT from 4/21 and make disc.  BI saw pt on 6/9 and in his note he states to order Super D ASAP.  Order was not placed.  I have talked to pt & told her we would have to check to see if BI received disc or if an order is needed for her to have a Super D.  Message sent to triage to put in new order if needed or to see if disc received.

## 2019-11-10 NOTE — Telephone Encounter (Signed)
I do not have the superD and it has been too long since the last CT chest. Please obtain new superD as discussed in last office visit.  Thanks Ok to place any orders as needed.  If nodule persistent tentative ENB would be for 11/29/2019  Garner Nash, DO [facility] 11/10/2019 5:55 PM

## 2019-11-11 NOTE — Telephone Encounter (Signed)
Working on getting this scheduled.

## 2019-11-11 NOTE — Telephone Encounter (Signed)
New order has been placed for super d ct. Routing back to [place].

## 2019-11-11 NOTE — Telephone Encounter (Signed)
Called and spoke with pt letting her know why we were ordering the Super D CT and she verbalized understanding. Sending back to [place] so she can contact pt to have CT scheduled.  Per Dr. Valeta Harms. Super D needs to be performed as soon as possible as plan based on CT results per Dr. Valeta Harms is to tentative ENB 11/29/19 based on results.

## 2019-11-11 NOTE — Telephone Encounter (Signed)
Pt was contacted to schedule the Super D.  Pt will not schedule without someone explaining why Dr. Valeta Harms is requesting the Super D after she had the PET done already.

## 2019-11-11 NOTE — Telephone Encounter (Signed)
Pt scheduled 6/24 @ 11 @ [facility].

## 2019-11-17 ENCOUNTER — Other Ambulatory Visit: Payer: Self-pay

## 2019-11-17 ENCOUNTER — Ambulatory Visit (INDEPENDENT_AMBULATORY_CARE_PROVIDER_SITE_OTHER)
Admission: RE | Admit: 2019-11-17 | Discharge: 2019-11-17 | Disposition: A | Discharge status: Home / Self Care | Payer: PPO | Source: Ambulatory Visit | Attending: Pulmonary Disease | Admitting: Pulmonary Disease

## 2019-11-17 DIAGNOSIS — D3502 Benign neoplasm of left adrenal gland: Secondary | ICD-10-CM | POA: Diagnosis not present

## 2019-11-17 DIAGNOSIS — R911 Solitary pulmonary nodule: Secondary | ICD-10-CM | POA: Diagnosis not present

## 2019-11-17 DIAGNOSIS — J432 Centrilobular emphysema: Secondary | ICD-10-CM | POA: Diagnosis not present

## 2019-11-23 ENCOUNTER — Telehealth: Payer: Self-pay | Admitting: Pulmonary Disease

## 2019-11-23 DIAGNOSIS — R911 Solitary pulmonary nodule: Secondary | ICD-10-CM

## 2019-11-23 DIAGNOSIS — R942 Abnormal results of pulmonary function studies: Secondary | ICD-10-CM

## 2019-11-23 NOTE — Telephone Encounter (Signed)
Dr. Chase Caller, did we need to place referral to Dr. Sondra Come?

## 2019-11-23 NOTE — Telephone Encounter (Signed)
Patient is calling regarding the referral to Oncology.  Can you follow up on this please?

## 2019-11-23 NOTE — Telephone Encounter (Signed)
I do not see this referral in the system

## 2019-11-24 NOTE — Telephone Encounter (Signed)
Called pt and advised message from the provider. Pt understood and verbalized understanding. Nothing further is needed.   Patient wanted to proceed with radiation oncology. Referral placed.

## 2019-11-24 NOTE — Telephone Encounter (Signed)
Patient checking on referral. Patient phone number is [phone] c or [phone] h.

## 2019-11-24 NOTE — Telephone Encounter (Signed)
Spoke with the pt  I advised that we got her msgs and that we will ask MR about referral to Dr. Sondra Come  Pt states Dr Valeta Harms already told her that she would need this referral  Please advise thanks  Please note pt very anxious and has already called about this multiple times today  Thanks

## 2019-11-24 NOTE — Telephone Encounter (Signed)
Called and spoke with Patient.  Patient is waiting on update on if she needs referral to Dr Sondra Come. Patient aware we are waiting on Dr Chase Caller to advise.

## 2019-11-24 NOTE — Telephone Encounter (Signed)
According to the review of my notes I sent the patient to see Dr. June Leap for evaluation of nodule..  Looks like he wants to do navigational bronchoscopy.  It appears that he is still trying to get the super D CT repeated and provisionally plan for navigational bronchoscopy first week of July 2021.  I am not so sure what changed.  There is a documentation that she was going to go talk with the family.  Question: Did she change her mind against the bronchoscopy and just want to go to radiation oncology straightaway directly?  Please check with her  Also coughing Dr. Valeta Harms on this message so that we are on the same page

## 2019-11-24 NOTE — Telephone Encounter (Signed)
PCCM: agree with RadOnc. As discussed with patient previously. High risk for bronch. She was very anxious about having general anesthesia. Agree with empiric treatment with SBRT if a candidate.

## 2019-11-24 NOTE — Telephone Encounter (Signed)
Patient calling to speak to Dr. Chase Caller. Patient phone number is [phone] c or [phone] h.

## 2019-11-29 ENCOUNTER — Other Ambulatory Visit: Payer: Self-pay | Admitting: Physician Assistant

## 2019-11-29 ENCOUNTER — Ambulatory Visit
Admission: RE | Admit: 2019-11-29 | Discharge: 2019-11-29 | Disposition: A | Discharge status: Home / Self Care | Payer: PPO | Source: Ambulatory Visit | Attending: Physician Assistant | Admitting: Physician Assistant

## 2019-11-29 DIAGNOSIS — T1490XA Injury, unspecified, initial encounter: Secondary | ICD-10-CM

## 2019-11-29 DIAGNOSIS — S60512A Abrasion of left hand, initial encounter: Secondary | ICD-10-CM | POA: Diagnosis not present

## 2019-11-29 DIAGNOSIS — S6992XA Unspecified injury of left wrist, hand and finger(s), initial encounter: Secondary | ICD-10-CM | POA: Diagnosis not present

## 2019-11-29 DIAGNOSIS — M7989 Other specified soft tissue disorders: Secondary | ICD-10-CM | POA: Diagnosis not present

## 2019-11-29 DIAGNOSIS — W548XXA Other contact with dog, initial encounter: Secondary | ICD-10-CM | POA: Diagnosis not present

## 2019-11-29 DIAGNOSIS — L03114 Cellulitis of left upper limb: Secondary | ICD-10-CM | POA: Diagnosis not present

## 2019-12-06 DIAGNOSIS — L03114 Cellulitis of left upper limb: Secondary | ICD-10-CM | POA: Diagnosis not present

## 2019-12-13 NOTE — Progress Notes (Signed)
Radiation Oncology         (336) [phone] ________________________________  Initial Outpatient Consultation  Name: Brandy Morales MRN: [id number]  Date: 12/14/2019  DOB: 04/19/1946  YI:FOYDX, Herbie [place], MD  Brand Males, MD   REFERRING PHYSICIAN: Brand Males, MD  DIAGNOSIS: The encounter diagnosis was Pulmonary nodules.  PET avid right upper lobe spiculated pulmonary nodule concerning for primary bronchogenic carcinoma  HISTORY OF PRESENT ILLNESS::Brandy Morales is a 74 y.o. female who is seen as a courtesy of Dr. Chase Caller for an opinion concerning radiation therapy as part of management for her recently diagnosed lung cancer. Today, she is accompanied by daughter. The patient presented to Dr. Chase Caller on 03/18/2017 for respiratory failure. High resolution chest CT scan on 03/30/2017 showed clustered sub-solid posterior right upper lobe pulmonary nodules, the largest of which was 2.1 x 0.8 cm. It also showed mild cylindrical bronchiectasis with associated post-infectious scarring in the medial segment of the right middle lobe and inferior segment of the lingula. Finally, there was severe centrilobular emphysema with diffuse bronchial wall thickening suggestive of COPD, left main and 3 vessel coronary atherosclerosis, stable left adrenal adenoma, and non-obstructing left nephrolithiasis. PET scan on 05/11/2017 showed a decrease in size of the right upper lobe pulmonary nodules with very mild metabolic activity that was most consistent with a resolving infectious or inflammatory process. Given the above findings, the patient agreed to follow-ups with CT monitoring.  Follow-up CT scan of chest of 11/05/2017 showed resolution of one right upper lobe pulmonary nodule. The other 10 mm right upper lobe pulmonary nodule remained stable. There was a new focal ground-glass opacity in the superior segment of the right lower lobe that was suggestive of inflammatory or infectious etiology.  Of note,  the patient underwent a cystoscopy with bilateral retrograde pyelogram, left ureteroscopy with laser lithotripsy, and left ureteral stent placement on 11/30/2017 that was performed by Dr. Gloriann Loan. She then underwent a cytoscopy with left ureteroscopy, laser lithotripsy, retrograde pyelogram, and ureteral stent placement on 02/01/2018.  Follow-up CT scan of chest on 09/14/2019 showed an irregular predominantly solid 1.5 cm posterior right upper lobe pulmonary nodule that had increased in size and density since 11/05/2017 and was suspicious for primary bronchogenic carcinoma. There were also two new additional sub-centimeter solid pulmonary nodules in the right upper and left lower lobes. There was no thoracic adenopathy.  PET scan on 10/12/2019 showed the enlarging nodule within the posterior right upper lobe that was FDG avid with an SUV max of 5.45 and was worrisome for primary bronchogenic carcinoma. The smaller nodule within the right upper lobe was technically too small to reliably characterize by PET-CT scan at 4 mm. There were no findings of FDG avid nodal metastasis within the chest or distant metastatic disease.  The patient was referred to Dr. Valeta Harms and was seen in consultation on 11/02/2019. At that time, they discussed super D CT of the chest and invasive tissue diagnosis including video bronchoscopy with navigational tissue sampling. They also discussed further treatment options including SBRT.  Super D CT of the chest on 11/17/2019 showed a stable appearance of the FDG avid spiculated nodule within the posterior right upper lobe. The additional small pulmonary nodules were unchanged from the previous exam. Finally, it showed emphysema, aortic atherosclerosis, three vessel coronary artery atherosclerotic calcifications, and left adrenal gland adenoma.  Navigational bronchoscopy was originally planned for the first week of July. However, the patient was very anxious about having general anesthesia  and given that she was high risk,  Dr. Valeta Harms agreed with empiric treatment with SBRT, if she was a candidate.  PREVIOUS RADIATION THERAPY: No  PAST MEDICAL HISTORY:  Past Medical History:  Diagnosis Date  . Anxiety   . Asthma   . COPD (chronic obstructive pulmonary disease) ([place])   . GERD (gastroesophageal reflux disease)   . History of kidney stones   . Hypertension     PAST SURGICAL HISTORY: Past Surgical History:  Procedure Laterality Date  . ABDOMINAL HYSTERECTOMY  age 65   partial 1 ovary left  . APPENDECTOMY    . colonscopy    . CYSTOSCOPY WITH RETROGRADE PYELOGRAM, URETEROSCOPY AND STENT PLACEMENT N/A 11/30/2017   Procedure: CYSTOSCOPY WITH BILATERAL RETROGRADE PYELOGRAM, LEFT URETEROSCOPY/ LEFT LASER LITHO AND LEFT STENT PLACEMENT;  Surgeon: Lucas Mallow, MD;  Location: [facility];  Service: Urology;  Laterality: N/A;  . CYSTOSCOPY WITH RETROGRADE PYELOGRAM, URETEROSCOPY AND STENT PLACEMENT Left 02/01/2018   Procedure: CYSTOSCOPY WITH LEFT RETROGRADE PYELOGRAM, URETEROSCOPY HOLMIUM LASER AND STENT PLACEMENT;  Surgeon: Lucas Mallow, MD;  Location: [facility];  Service: Urology;  Laterality: Left;  . EYE SURGERY Bilateral    Cataract  . TONSILLECTOMY      FAMILY HISTORY: History reviewed. No pertinent family history.  SOCIAL HISTORY:  Social History   Tobacco Use  . Smoking status: Former Smoker    Packs/day: 2.00    Years: 55.00    Pack years: 110.00    Types: Cigarettes    Quit date: 2016    Years since quitting: 5.5  . Smokeless tobacco: Never Used  Vaping Use  . Vaping Use: Never used  Substance Use Topics  . Alcohol use: Yes    Alcohol/week: 1.0 standard drink    Types: 1 Glasses of wine per week    Comment: weekly  . Drug use: Never    ALLERGIES:  Allergies  Allergen Reactions  . Statins Cough  . Latex Other (See Comments)    Broke out on face    MEDICATIONS:  Current Outpatient Medications  Medication Sig Dispense Refill  . acyclovir  (ZOVIRAX) 400 MG tablet Take 400 mg by mouth 3 (three) times daily as needed (for outbreak).     . Artificial Tear Solution (GENTEAL TEARS OP) Place 1 drop into both eyes daily.     Marland Kitchen BREO ELLIPTA 200-25 MCG/INH AEPB Inhale 1 puff into the lungs daily.    . cetirizine (ZYRTEC) 10 MG tablet Take 10 mg by mouth daily.    . Cholecalciferol (VITAMIN D3) 5000 units CAPS Take 5,000 Units by mouth daily.    . citalopram (CELEXA) 20 MG tablet Take 20 mg by mouth daily.    Marland Kitchen COENZYME Q10 PO Take 10 mLs by mouth daily.     . fluticasone (FLONASE) 50 MCG/ACT nasal spray Place 1 spray into both nostrils daily.     Marland Kitchen levalbuterol (XOPENEX) 1.25 MG/3ML nebulizer solution Use 1 vial in nebulizer up to every 8 hour as needed for difficulty breathing    . losartan-hydrochlorothiazide (HYZAAR) 100-25 MG tablet Take 0.5 tablets by mouth daily.    . Multiple Vitamins-Minerals (MULTIVITAMIN PO) Take 6 each by mouth daily.     . naproxen sodium (ALEVE) 220 MG tablet Take 220 mg by mouth daily.    Marland Kitchen omeprazole (PRILOSEC) 20 MG capsule Take 20 mg by mouth daily.     . OXYGEN Inhale 2 L into the lungs continuous.     Marland Kitchen tiotropium (SPIRIVA) 18 MCG inhalation capsule Place 1  capsule (18 mcg total) into inhaler and inhale daily. 30 capsule 6  . ezetimibe (ZETIA) 10 MG tablet Take 10 mg by mouth daily. (Patient not taking: Reported on 12/14/2019)     No current facility-administered medications for this encounter.    REVIEW OF SYSTEMS:  A 10+ POINT REVIEW OF SYSTEMS WAS OBTAINED including neurology, dermatology, psychiatry, cardiac, respiratory, lymph, extremities, GI, GU, musculoskeletal, constitutional, reproductive, HEENT.  She denies any hemoptysis or pain within the chest region.  She denies any new bony pain, headaches or visual problems   PHYSICAL EXAM:  height is 5\' 4"  (1.626 m) and weight is 175 lb 3.2 oz (79.5 kg). Her temperature is 98.4 F (36.9 C). Her blood pressure is 140/85 and her pulse is 112 (abnormal).  Her respiration is 24 (abnormal) and oxygen saturation is 96%.   General: Alert and oriented, in no acute distress, supplemental oxygen in place at 2 L HEENT: Head is normocephalic. Extraocular movements are intact. Oropharynx is clear. Neck: Neck is supple, no palpable cervical or supraclavicular lymphadenopathy. Heart: Regular in rate and rhythm with no murmurs, rubs, or gallops. Chest: Clear to auscultation bilaterally, with no rhonchi, wheezes, or rales. Abdomen: Soft, nontender, nondistended, with no rigidity or guarding. Extremities: No cyanosis or edema. Lymphatics: see Neck Exam Skin: No concerning lesions. Musculoskeletal: symmetric strength and muscle tone throughout. Neurologic: Cranial nerves II through XII are grossly intact. No obvious focalities. Speech is fluent. Coordination is intact. Psychiatric: Judgment and insight are intact. Affect is appropriate.   ECOG = 1  0 - Asymptomatic (Fully active, able to carry on all predisease activities without restriction)  1 - Symptomatic but completely ambulatory (Restricted in physically strenuous activity but ambulatory and able to carry out work of a light or sedentary nature. For example, light housework, office work)  2 - Symptomatic, <50% in bed during the day (Ambulatory and capable of all self care but unable to carry out any work activities. Up and about more than 50% of waking hours)  3 - Symptomatic, >50% in bed, but not bedbound (Capable of only limited self-care, confined to bed or chair 50% or more of waking hours)  4 - Bedbound (Completely disabled. Cannot carry on any self-care. Totally confined to bed or chair)  5 - Death   Eustace Pen MM, Creech RH, Tormey DC, et al. [phone]). "Toxicity and response criteria of the [facility]". [place] Oncol. 5 (6): 649-55  LABORATORY DATA:  Lab Results  Component Value Date   WBC 6.8 02/01/2018   HGB 14.9 02/01/2018   HCT 45.8 02/01/2018   MCV 89.6  02/01/2018   PLT 157 02/01/2018   Lab Results  Component Value Date   NA 142 11/25/2017   K 4.4 11/25/2017   CL 101 11/25/2017   CO2 35 (H) 11/25/2017   GLUCOSE 93 11/25/2017   CREATININE 0.82 11/25/2017   CALCIUM 9.8 11/25/2017      RADIOGRAPHY: DG Hand Complete Left  Result Date: 11/30/2019 CLINICAL DATA:  Recent injury with abrasions, initial encounter EXAM: LEFT HAND - COMPLETE 3+ VIEW COMPARISON:  None. FINDINGS: There is no evidence of fracture or dislocation. There is no evidence of arthropathy or other focal bone abnormality. Mild soft tissue swelling is noted. IMPRESSION: No acute abnormality noted. Electronically Signed   By: Inez Catalina M.D.   On: 11/30/2019 01:41   CT Super D Chest Wo Contrast  Result Date: 11/17/2019 CLINICAL DATA:  Nodules in right lung. EXAM: CT CHEST  WITHOUT CONTRAST TECHNIQUE: Multidetector CT imaging of the chest was performed using thin slice collimation for electromagnetic bronchoscopy planning purposes, without intravenous contrast. COMPARISON:  PET-CT 10/12/2019 FINDINGS: Cardiovascular: Normal heart size. No pericardial effusion. Aortic atherosclerosis. Three vessel coronary artery atherosclerotic calcifications. Mediastinum/Nodes: Normal appearance of the thyroid gland. The trachea appears patent and is midline. Normal appearance of the esophagus. No enlarged mediastinal or hilar lymph nodes. Lungs/Pleura: Centrilobular emphysema. Scar and volume loss within the lingula identified, image 105/3. -The spiculated nodule within the posterior right upper lobe is again noted measuring 1.3 by 0.9 cm, image 69/3. Unchanged from 09/14/2019. -Central right upper lobe lung nodule measuring 5 mm is unchanged, image 36/3. -Subpleural scar like density within the anterior right upper lobe is unchanged, image 42/2. -3 mm posterior left lower lobe lung nodule is unchanged, image 139/3. Upper Abdomen: No acute abnormality noted. Scattered liver cysts are again identified.  2.8 cm left adrenal gland nodule has been present since 2009 and most likely represents a benign adenoma, image 176/2. Musculoskeletal: No chest wall mass or suspicious bone lesions identified. IMPRESSION: 1. Stable appearance of FDG avid spiculated nodule within the posterior right upper lobe. 2. Additional small pulmonary nodules are unchanged from previous exam. 3. Emphysema and aortic atherosclerosis. 4. Three vessel coronary artery atherosclerotic calcifications. 5. Left adrenal gland adenoma. Aortic Atherosclerosis (ICD10-I70.0) and Emphysema (ICD10-J43.9). Electronically Signed   By: Kerby Moors M.D.   On: 11/17/2019 12:29      IMPRESSION: PET avid right upper lobe spiculated pulmonary nodule concerning for primary bronchogenic carcinoma  CT and PET images are very suspicious for clinical stage I non-small cell lung cancer present in the right upper lobe.  She has one additional smaller lesion ~4 mm which is too small to characterize with PET imaging and warrants close follow-up.  Patient has a significant history of COPD and has over100-pack-year history of smoking.  She is oxygen dependent at this time.  Given these findings she would not be a candidate for surgical intervention.  Bronchoscopy/biopsy also would be of concern and the patient has elected not to proceed with this procedure.  Given the location of the lesion CT-guided biopsy would also be with significant risk for pneumothorax.  Given these findings the patient would be a candidate for proceeding with stereotactic body radiation therapy without tissue diagnosis.  The location of the lesion and size of the lesion would make her an excellent candidate for SBRT.  Today, I talked to the patient and daughter about the findings and work-up thus far.  We discussed the natural history of lung cancer and general treatment, highlighting the role of radiotherapy (SBRT) in the management.  We discussed the available radiation techniques, and  focused on the details of logistics and delivery.  We reviewed the anticipated acute and late sequelae associated with radiation in this setting.  The patient was encouraged to ask questions that I answered to the best of my ability.  A patient consent form was discussed and signed.  We retained a copy for our records.  The patient would like to proceed with radiation and will be scheduled for CT simulation.  PLAN: She will return on July 26 for CT simulation with treatments to begin approximately a week later.  Anticipate 3 SBRT treatments directed at the solitary right upper lung pulmonary nodule.  Total time spent in this encounter was 65 minutes which included reviewing the patient's pulmonary history, CT scans, PET scans, consultations, follow-ups, physical examination, and documentation.    ------------------------------------------------  Blair Promise, PhD, MD  This document serves as a record of services personally performed by Gery Pray, MD. It was created on his behalf by Clerance Lav, a trained medical scribe. The creation of this record is based on the scribe's personal observations and the provider's statements to them. This document has been checked and approved by the attending provider.

## 2019-12-13 NOTE — Progress Notes (Addendum)
Radiation Oncology         [phone]) [phone] ________________________________  Initial Outpatient Consultation  Name: Brandy Morales           MRN: [id number]         Date: 12/14/2019                      DOB: 03-25-1946  VZ:DGLOV, Herbie [place], MD  Brand Males, MD   REFERRING PHYSICIAN: Brand Males, MD  DIAGNOSIS: There were no encounter diagnoses.  PET avid right upper lobe spiculated pulmonary nodule concerning for primary bronchogenic carcinoma  HISTORY OF PRESENT ILLNESS::Brandy Morales is a 74 y.o. female who is seen as a courtesy of Dr. Chase Caller for an opinion concerning radiation therapy as part of management for her recently diagnosed lung cancer. Today, she is accompanied by  . The patient presented to Dr. Chase Caller on 03/18/2017 for respiratory failure. High resolution chest CT scan on 03/30/2017 showed clustered sub-solid posterior right upper lobe pulmonary nodules, the largest of which was 2.1 x 0.8 cm. It also showed mild cylindrical bronchiectasis with associated post-infectious scarring in the medial segment of the right middle lobe and inferior segment of the lingula. Finally, there was severe centrilobular emphysema with diffuse bronchial wall thickening suggestive of COPD, left main and 3 vessel coronary atherosclerosis, stable left adrenal adenoma, and non-obstructing left nephrolithiasis. PET scan on 05/11/2017 showed a decrease in size of the right upper lobe pulmonary nodules with very mild metabolic activity that was most consistent with a resolving infectious or inflammatory process. Given the above findings, the patient agreed to follow-ups with CT monitoring.  Follow-up CT scan of chest of 11/05/2017 showed resolution of one right upper lobe pulmonary nodule. The other 10 mm right upper lobe pulmonary nodule remained stable. There was a new focal ground-glass opacity in the superior segment of the right lower lobe that was suggestive of inflammatory or  infectious etiology.  Of note, the patient underwent a cystoscopy with bilateral retrograde pyelogram, left ureteroscopy with laser lithotripsy, and left ureteral stent placement on 11/30/2017 that was performed by Dr. Gloriann Loan. She then underwent a cytoscopy with left ureteroscopy, laser lithotripsy, retrograde pyelogram, and ureteral stent placement on 02/01/2018.  Follow-up CT scan of chest on 09/14/2019 showed an irregular predominantly solid 1.5 cm posterior right upper lobe pulmonary nodule that had increased in size and density since 11/05/2017 and was suspicious for primary bronchogenic carcinoma. There were also two new additional sub-centimeter solid pulmonary nodules in the right upper and left lower lobes. There was no thoracic adenopathy.  PET scan on 10/12/2019 showed the enlarging nodule within the posterior right upper lobe that was FDG avid with an SUV max of 5.45 and was worrisome for primary bronchogenic carcinoma. The smaller nodule within the right upper lobe was technically too small to reliably characterize by PET-CT scan at 4 mm. There were no findings of FDG avid nodal metastasis within the chest or distant metastatic disease.  The patient was referred to Dr. Valeta Harms and was seen in consultation on 11/02/2019. At that time, they discussed super D CT of the chest and invasive tissue diagnosis including video bronchoscopy with navigational tissue sampling. They also discussed further treatment options including SBRT.  Super D CT of the chest on 11/17/2019 showed a stable appearance of the FDG avid spiculated nodule within the posterior right upper lobe. The additional small pulmonary nodules were unchanged from the previous exam. Finally, it showed emphysema, aortic atherosclerosis, three vessel coronary artery  atherosclerotic calcifications, and left adrenal gland adenoma.  Navigational bronchoscopy was originally planned for the first week of July. However, the patient was very  anxious about having general anesthesia and given that she was high risk, Dr. Valeta Harms agreed with empiric treatment with SBRT, if she was a candidate.  Past/Anticipated interventions by cardiothoracic surgery, if any: no, not a candidate  Past/Anticipated interventions by medical oncology, if any: no  Signs/Symptoms  Weight changes, if any: no  Respiratory complaints, if any:On O2 at 2l N/C  Hemoptysis, if any: none  Pain issues, if any: has a h/a  SAFETY ISSUES:  Prior radiation? no  Pacemaker/ICD? no  Possible current pregnancy: Hysterectomy  Is the patient on methotrexate? no  Current Complaints / other details: frequent productive cough of white phlegm. Patient is accompanied by her daughter.  BP 140/85 (BP Location: Right Arm, Patient Position: Sitting, Cuff Size: Normal)   Pulse (!) 112   Temp 98.4 F (36.9 C)   Resp (!) 24   Ht 5\' 4"  (1.626 m)   Wt 175 lb 3.2 oz (79.5 kg)   SpO2 96%   PF (!) 2 L/min   BMI 30.07 kg/m    Wt Readings from Last 3 Encounters:  12/14/19 175 lb 3.2 oz (79.5 kg)  11/02/19 176 lb 9.6 oz (80.1 kg)  09/28/19 174 lb 9.6 oz (79.2 kg)   Patient is using Estradiol cream 2 times weekly at bedtime. She is taking liquid Collagen 1 Tbsp daily.

## 2019-12-14 ENCOUNTER — Ambulatory Visit
Admission: RE | Admit: 2019-12-14 | Discharge: 2019-12-14 | Disposition: A | Discharge status: Home / Self Care | Payer: PPO | Source: Ambulatory Visit | Attending: Radiation Oncology | Admitting: Radiation Oncology

## 2019-12-14 ENCOUNTER — Other Ambulatory Visit: Payer: Self-pay

## 2019-12-14 ENCOUNTER — Encounter: Payer: Self-pay | Admitting: Radiation Oncology

## 2019-12-14 DIAGNOSIS — Z87891 Personal history of nicotine dependence: Secondary | ICD-10-CM | POA: Insufficient documentation

## 2019-12-14 DIAGNOSIS — I2510 Atherosclerotic heart disease of native coronary artery without angina pectoris: Secondary | ICD-10-CM | POA: Insufficient documentation

## 2019-12-14 DIAGNOSIS — Z79899 Other long term (current) drug therapy: Secondary | ICD-10-CM | POA: Diagnosis not present

## 2019-12-14 DIAGNOSIS — Z87442 Personal history of urinary calculi: Secondary | ICD-10-CM | POA: Diagnosis not present

## 2019-12-14 DIAGNOSIS — K219 Gastro-esophageal reflux disease without esophagitis: Secondary | ICD-10-CM | POA: Insufficient documentation

## 2019-12-14 DIAGNOSIS — R918 Other nonspecific abnormal finding of lung field: Secondary | ICD-10-CM

## 2019-12-14 DIAGNOSIS — R911 Solitary pulmonary nodule: Secondary | ICD-10-CM | POA: Insufficient documentation

## 2019-12-14 DIAGNOSIS — J449 Chronic obstructive pulmonary disease, unspecified: Secondary | ICD-10-CM | POA: Insufficient documentation

## 2019-12-14 DIAGNOSIS — I700 Atherosclerosis of aorta: Secondary | ICD-10-CM | POA: Insufficient documentation

## 2019-12-14 DIAGNOSIS — I10 Essential (primary) hypertension: Secondary | ICD-10-CM | POA: Insufficient documentation

## 2019-12-19 ENCOUNTER — Other Ambulatory Visit: Payer: Self-pay

## 2019-12-19 ENCOUNTER — Ambulatory Visit
Admission: RE | Admit: 2019-12-19 | Discharge: 2019-12-19 | Disposition: A | Discharge status: Home / Self Care | Payer: PPO | Source: Ambulatory Visit | Attending: Radiation Oncology | Admitting: Radiation Oncology

## 2019-12-19 DIAGNOSIS — R918 Other nonspecific abnormal finding of lung field: Secondary | ICD-10-CM

## 2019-12-19 DIAGNOSIS — Z510 Encounter for antineoplastic radiation therapy: Secondary | ICD-10-CM | POA: Insufficient documentation

## 2019-12-19 DIAGNOSIS — R911 Solitary pulmonary nodule: Secondary | ICD-10-CM | POA: Diagnosis not present

## 2019-12-21 DIAGNOSIS — L03114 Cellulitis of left upper limb: Secondary | ICD-10-CM | POA: Diagnosis not present

## 2019-12-21 DIAGNOSIS — C3490 Malignant neoplasm of unspecified part of unspecified bronchus or lung: Secondary | ICD-10-CM | POA: Diagnosis not present

## 2019-12-21 DIAGNOSIS — J441 Chronic obstructive pulmonary disease with (acute) exacerbation: Secondary | ICD-10-CM | POA: Diagnosis not present

## 2019-12-21 NOTE — Progress Notes (Signed)
  Radiation Oncology         [phone] ________________________________  Name: Brandy Morales MRN: [id number]  Date: 12/19/2019  DOB: [birth year]   STEREOTACTIC BODY RADIOTHERAPY SIMULATION AND TREATMENT PLANNING NOTE    DIAGNOSIS: PET avid right upper lobe spiculated pulmonary nodule concerning for primary bronchogenic carcinoma   NARRATIVE:  The patient was brought to the [facility].  Identity was confirmed.  All relevant records and images related to the planned course of therapy were reviewed.  The patient freely provided informed written consent to proceed with treatment after reviewing the details related to the planned course of therapy. The consent form was witnessed and verified by the simulation staff.  Then, the patient was set-up in a stable reproducible  supine position for radiation therapy.  A BodyFix immobilization pillow was fabricated for reproducible positioning.  Then I personally applied the abdominal compression paddle to limit respiratory excursion.  4D respiratoy motion management CT images were obtained.  Surface markings were placed.  The CT images were loaded into the planning software.  Then, using Cine, MIP, and standard views, the internal target volume (ITV) and planning target volumes (PTV) were delinieated, and avoidance structures were contoured.  Treatment planning then occurred.  The radiation prescription was entered and confirmed.  A total of two complex treatment devices were fabricated in the form of the BodyFix immobilization pillow and a neck accuform cushion.  I have requested : 3D Simulation  I have requested a DVH of the following structures: Heart, Lungs, Esophagus, Chest Wall, Brachial Plexus, Major Blood Vessels, and targets.  PLAN:  The patient will receive 54 Gy in 3 fractions.  -----------------------------------  Blair Promise, PhD, MD

## 2019-12-23 DIAGNOSIS — E782 Mixed hyperlipidemia: Secondary | ICD-10-CM | POA: Diagnosis not present

## 2019-12-23 DIAGNOSIS — J441 Chronic obstructive pulmonary disease with (acute) exacerbation: Secondary | ICD-10-CM | POA: Diagnosis not present

## 2019-12-23 DIAGNOSIS — E7800 Pure hypercholesterolemia, unspecified: Secondary | ICD-10-CM | POA: Diagnosis not present

## 2019-12-23 DIAGNOSIS — R911 Solitary pulmonary nodule: Secondary | ICD-10-CM | POA: Diagnosis not present

## 2019-12-23 DIAGNOSIS — J449 Chronic obstructive pulmonary disease, unspecified: Secondary | ICD-10-CM | POA: Diagnosis not present

## 2019-12-23 DIAGNOSIS — I10 Essential (primary) hypertension: Secondary | ICD-10-CM | POA: Diagnosis not present

## 2019-12-23 DIAGNOSIS — D4100 Neoplasm of uncertain behavior of unspecified kidney: Secondary | ICD-10-CM | POA: Diagnosis not present

## 2019-12-23 DIAGNOSIS — C3490 Malignant neoplasm of unspecified part of unspecified bronchus or lung: Secondary | ICD-10-CM | POA: Diagnosis not present

## 2019-12-23 DIAGNOSIS — Z510 Encounter for antineoplastic radiation therapy: Secondary | ICD-10-CM | POA: Diagnosis not present

## 2019-12-29 ENCOUNTER — Other Ambulatory Visit: Payer: Self-pay

## 2019-12-29 ENCOUNTER — Ambulatory Visit
Admission: RE | Admit: 2019-12-29 | Discharge: 2019-12-29 | Disposition: A | Discharge status: Home / Self Care | Payer: PPO | Source: Ambulatory Visit | Attending: Radiation Oncology | Admitting: Radiation Oncology

## 2019-12-29 DIAGNOSIS — D4100 Neoplasm of uncertain behavior of unspecified kidney: Secondary | ICD-10-CM | POA: Diagnosis not present

## 2019-12-29 DIAGNOSIS — I10 Essential (primary) hypertension: Secondary | ICD-10-CM | POA: Diagnosis not present

## 2019-12-29 DIAGNOSIS — J449 Chronic obstructive pulmonary disease, unspecified: Secondary | ICD-10-CM | POA: Diagnosis not present

## 2019-12-29 DIAGNOSIS — J441 Chronic obstructive pulmonary disease with (acute) exacerbation: Secondary | ICD-10-CM | POA: Diagnosis not present

## 2019-12-29 DIAGNOSIS — R911 Solitary pulmonary nodule: Secondary | ICD-10-CM | POA: Diagnosis not present

## 2019-12-29 DIAGNOSIS — C3490 Malignant neoplasm of unspecified part of unspecified bronchus or lung: Secondary | ICD-10-CM | POA: Diagnosis not present

## 2019-12-29 DIAGNOSIS — E7800 Pure hypercholesterolemia, unspecified: Secondary | ICD-10-CM | POA: Diagnosis not present

## 2019-12-29 DIAGNOSIS — E782 Mixed hyperlipidemia: Secondary | ICD-10-CM | POA: Diagnosis not present

## 2019-12-29 DIAGNOSIS — Z510 Encounter for antineoplastic radiation therapy: Secondary | ICD-10-CM | POA: Insufficient documentation

## 2020-01-02 NOTE — Progress Notes (Signed)
°  Radiation Oncology         [phone] ________________________________  Name: Brandy Morales MRN: [id number]  Date: 01/03/2020  DOB: 04-29-1946  Stereotactic Body Radiotherapy Treatment Procedure Note  NARRATIVE:  Brandy Morales was brought to the stereotactic radiation treatment machine and placed supine on the CT couch. The patient was set up for stereotactic body radiotherapy on the body fix pillow.  3D TREATMENT PLANNING AND DOSIMETRY:  The patient's radiation plan was reviewed and approved prior to starting treatment.  It showed 3-dimensional radiation distributions overlaid onto the planning CT.  The [facility] for the target structures as well as the organs at risk were reviewed. The documentation of this is filed in the radiation oncology EMR.  SIMULATION VERIFICATION:  The patient underwent CT imaging on the treatment unit.  These were carefully aligned to document that the ablative radiation dose would cover the target volume and maximally spare the nearby organs at risk according to the planned distribution.  SPECIAL TREATMENT PROCEDURE: Brandy Morales received high dose ablative stereotactic body radiotherapy to the planned target volume without unforeseen complications. Treatment was delivered uneventfully. The high doses associated with stereotactic body radiotherapy and the significant potential risks require careful treatment set up and patient monitoring constituting a special treatment procedure   STEREOTACTIC TREATMENT MANAGEMENT:  Following delivery, the patient was evaluated clinically. The patient tolerated treatment without significant acute effects, and was discharged to home in stable condition.    PLAN: Continue treatment as planned.  ________________________________  Blair Promise, PhD, MD  This document serves as a record of services personally performed by Gery Pray, MD. It was created on his behalf by Clerance Lav, a trained medical scribe. The creation of  this record is based on the scribe's personal observations and the provider's statements to them. This document has been checked and approved by the attending provider.

## 2020-01-03 ENCOUNTER — Other Ambulatory Visit: Payer: Self-pay

## 2020-01-03 ENCOUNTER — Ambulatory Visit
Admission: RE | Admit: 2020-01-03 | Discharge: 2020-01-03 | Disposition: A | Discharge status: Home / Self Care | Payer: PPO | Source: Ambulatory Visit | Attending: Radiation Oncology | Admitting: Radiation Oncology

## 2020-01-03 DIAGNOSIS — Z510 Encounter for antineoplastic radiation therapy: Secondary | ICD-10-CM | POA: Diagnosis not present

## 2020-01-03 DIAGNOSIS — R918 Other nonspecific abnormal finding of lung field: Secondary | ICD-10-CM

## 2020-01-05 ENCOUNTER — Other Ambulatory Visit: Payer: Self-pay

## 2020-01-05 ENCOUNTER — Ambulatory Visit
Admission: RE | Admit: 2020-01-05 | Discharge: 2020-01-05 | Disposition: A | Discharge status: Home / Self Care | Payer: PPO | Source: Ambulatory Visit | Attending: Radiation Oncology | Admitting: Radiation Oncology

## 2020-01-05 ENCOUNTER — Encounter: Payer: Self-pay | Admitting: Radiation Oncology

## 2020-01-05 DIAGNOSIS — R911 Solitary pulmonary nodule: Secondary | ICD-10-CM | POA: Diagnosis not present

## 2020-01-05 DIAGNOSIS — Z923 Personal history of irradiation: Secondary | ICD-10-CM

## 2020-01-05 DIAGNOSIS — Z510 Encounter for antineoplastic radiation therapy: Secondary | ICD-10-CM | POA: Diagnosis not present

## 2020-01-05 HISTORY — DX: Personal history of irradiation: Z92.3

## 2020-01-16 NOTE — Progress Notes (Incomplete)
  Patient Name: KEALI MCCRAW MRN: [id number] DOB: [birth year] Referring Physician: Brand Males (Profile Not Attached) Date of Service: 01/05/2020 [facility] [place]                                                        End Of Treatment Note  Diagnoses: R91.1-Solitary pulmonary nodule R91.8-Other nonspecific abnormal finding of lung field  Cancer Staging: PET avid right upper lobe spiculated pulmonary nodule concerning for primary bronchogenic carcinoma   Intent: Curative  Radiation Treatment Dates: 12/29/2019 through 01/05/2020 Site Technique Total Dose (Gy) Dose per Fx (Gy) Completed Fx Beam Energies  Lung, Right: Lung_Rt IMRT 54/54 18 3/3 6XFFF   Narrative: The patient tolerated radiation therapy relatively well. She did report mild fatigue. She denied shortness of breath, poor appetite, and difficulty swallowing. She remained on 3L supplemental oxygen.  Plan: The patient will follow-up with radiation oncology in one month.  ________________________________________________   Blair Promise, PhD, MD  This document serves as a record of services personally performed by Gery Pray, MD. It was created on his behalf by Clerance Lav, a trained medical scribe. The creation of this record is based on the scribe's personal observations and the provider's statements to them. This document has been checked and approved by the attending provider.

## 2020-01-31 DIAGNOSIS — J449 Chronic obstructive pulmonary disease, unspecified: Secondary | ICD-10-CM | POA: Diagnosis not present

## 2020-02-13 ENCOUNTER — Other Ambulatory Visit: Payer: Self-pay

## 2020-02-13 ENCOUNTER — Encounter: Payer: Self-pay | Admitting: Radiation Oncology

## 2020-02-13 ENCOUNTER — Ambulatory Visit
Admission: RE | Admit: 2020-02-13 | Discharge: 2020-02-13 | Disposition: A | Discharge status: Home / Self Care | Payer: PPO | Source: Ambulatory Visit | Attending: Radiation Oncology | Admitting: Radiation Oncology

## 2020-02-13 VITALS — BP 114/98 | HR 104 | Temp 97.9°F | Resp 24 | Ht 64.0 in | Wt 174.8 lb

## 2020-02-13 DIAGNOSIS — Z79899 Other long term (current) drug therapy: Secondary | ICD-10-CM | POA: Insufficient documentation

## 2020-02-13 DIAGNOSIS — R5383 Other fatigue: Secondary | ICD-10-CM | POA: Diagnosis not present

## 2020-02-13 DIAGNOSIS — N390 Urinary tract infection, site not specified: Secondary | ICD-10-CM | POA: Diagnosis not present

## 2020-02-13 DIAGNOSIS — R918 Other nonspecific abnormal finding of lung field: Secondary | ICD-10-CM | POA: Diagnosis not present

## 2020-02-13 DIAGNOSIS — Z923 Personal history of irradiation: Secondary | ICD-10-CM | POA: Diagnosis not present

## 2020-02-13 DIAGNOSIS — R0602 Shortness of breath: Secondary | ICD-10-CM | POA: Insufficient documentation

## 2020-02-13 NOTE — Progress Notes (Signed)
Radiation Oncology         [phone] ________________________________  Name: Brandy Morales MRN: [id number]  Date: 02/13/2020  DOB: [birth year]  Follow-Up Visit Note  CC: Brandy Huddle, MD  Brand Males, MD    ICD-10-CM   1. Pulmonary nodules  R91.8     Diagnosis: PET avid right upper lobe spiculated pulmonary nodules concerning for primary bronchogenic carcinoma  Interval Since Last Radiation: One month, one week, and one day.  Radiation Treatment Dates: 12/29/2019 through 01/05/2020 Site Technique Total Dose (Gy) Dose per Fx (Gy) Completed Fx Beam Energies  Lung, Right: Lung_Rt IMRT 54/54 18 3/3 6XFFF    Narrative:  The patient returns today for routine follow-up. No significant interval history since the end of treatment with the exception of taking an antibiotic for UTI.  On review of systems, she reports one dizzy spell last week that spontaneously resolved. She also reports shortness of breath with exertion, fatigue, and sleeping a lot. She denies nausea, vomiting, headache, cough, and skin changes.  She reports having a lot of fatigue related to her urinary tract infection but overall this is improving.  She denies any significant cough or hemoptysis            ALLERGIES:  is allergic to statins and latex.  Meds: Current Outpatient Medications  Medication Sig Dispense Refill  . amoxicillin-clavulanate (AUGMENTIN) 875-125 MG tablet SMARTSIG:1 Tablet(s) By Mouth Every 12 Hours    . Artificial Tear Solution (GENTEAL TEARS OP) Place 1 drop into both eyes daily.     Marland Kitchen BREO ELLIPTA 200-25 MCG/INH AEPB Inhale 1 puff into the lungs daily.    . cetirizine (ZYRTEC) 10 MG tablet Take 10 mg by mouth daily.    . Cholecalciferol (VITAMIN D3) 5000 units CAPS Take 5,000 Units by mouth daily.    . citalopram (CELEXA) 20 MG tablet Take 20 mg by mouth daily.    Marland Kitchen COENZYME Q10 PO Take 10 mLs by mouth daily.     . fluticasone (FLONASE) 50 MCG/ACT nasal spray Place 1 spray into both  nostrils daily.     Marland Kitchen levalbuterol (XOPENEX) 1.25 MG/3ML nebulizer solution Use 1 vial in nebulizer up to every 8 hour as needed for difficulty breathing    . losartan-hydrochlorothiazide (HYZAAR) 100-25 MG tablet Take 0.5 tablets by mouth daily.    . Multiple Vitamins-Minerals (MULTIVITAMIN PO) Take 6 each by mouth daily.     . naproxen sodium (ALEVE) 220 MG tablet Take 220 mg by mouth daily.    Marland Kitchen omeprazole (PRILOSEC) 20 MG capsule Take 20 mg by mouth daily.     . OXYGEN Inhale 2 L into the lungs continuous.     Marland Kitchen tiotropium (SPIRIVA) 18 MCG inhalation capsule Place 1 capsule (18 mcg total) into inhaler and inhale daily. 30 capsule 6  . acyclovir (ZOVIRAX) 400 MG tablet Take 400 mg by mouth 3 (three) times daily as needed (for outbreak).  (Patient not taking: Reported on 02/13/2020)    . ezetimibe (ZETIA) 10 MG tablet Take 10 mg by mouth daily.  (Patient not taking: Reported on 02/13/2020)     No current facility-administered medications for this encounter.    Physical Findings: The patient is in no acute distress. Patient is alert and oriented.  height is 5\' 4"  (1.626 m) and weight is 174 lb 12.8 oz (79.3 kg). Her temperature is 97.9 F (36.6 C). Her blood pressure is 114/98 (abnormal) and her pulse is 104 (abnormal). Her respiration is 24 (abnormal) and  oxygen saturation is 96%.    Lungs are clear to auscultation bilaterally. Heart has regular rate and rhythm. No palpable cervical, supraclavicular, or axillary adenopathy. Abdomen soft, non-tender, normal bowel sounds.  oxygen in place  Lab Findings: Lab Results  Component Value Date   WBC 6.8 02/01/2018   HGB 14.9 02/01/2018   HCT 45.8 02/01/2018   MCV 89.6 02/01/2018   PLT 157 02/01/2018    Radiographic Findings: No results found.  Impression: PET avid right upper lobe spiculated pulmonary nodule concerning for primary bronchogenic carcinoma  The patient is recovering from the effects of radiation.  She experienced a lot of  fatigue but this is in part related to other issues going on.  No worsening of her breathing.  Plan: The patient will follow-up with radiation oncology in 3 months. She will undergo a chest CT scan just prior to that visit.    ____________________________________   Blair Promise, PhD, MD  This document serves as a record of services personally performed by Gery Pray, MD. It was created on his behalf by Clerance Lav, a trained medical scribe. The creation of this record is based on the scribe's personal observations and the provider's statements to them. This document has been checked and approved by the attending provider.

## 2020-02-13 NOTE — Progress Notes (Addendum)
Heart rate slightly elevated most likely related to exertion. Otherwise vitals and weight are stable. Denies pain. Denies headache, nausea, or vomiting. Reports she had one dizzy spell last week but none since. Continuous oxygen therapy 2-3 liters via nasal cannula and portable oxygen noted. Denies cough. Reports shortness of breath with even mild exertion. Denies skin changes within treatment field. Reports fatigue and sleeping a lot s/p completion of radiation therapy. Plans to call tomorrow to reschedule with Dr. Inda Merlin. Scheduled for annual ob gyn appointment in October. Explains she is presently taking an antibiotic to manage a UTI. In addition, she explains that a cyst "in my private area that I have had for thirty years burst since I last saw Dr. Sondra Come." Explains she is uncertain if her continued fatigue is related to effects of radiation, her UTI or the cyst.   BP (!) 114/98 (BP Location: Right Arm, Patient Position: Sitting, Cuff Size: Normal)   Pulse (!) 104   Temp 97.9 F (36.6 C)   Resp (!) 24   Ht 5\' 4"  (1.626 m)   Wt 174 lb 12.8 oz (79.3 kg)   SpO2 96%   PF (!) 3 L/min   BMI 30.00 kg/m  Wt Readings from Last 3 Encounters:  02/13/20 174 lb 12.8 oz (79.3 kg)  12/14/19 175 lb 3.2 oz (79.5 kg)  11/02/19 176 lb 9.6 oz (80.1 kg)

## 2020-02-20 DIAGNOSIS — I10 Essential (primary) hypertension: Secondary | ICD-10-CM | POA: Diagnosis not present

## 2020-02-20 DIAGNOSIS — E7800 Pure hypercholesterolemia, unspecified: Secondary | ICD-10-CM | POA: Diagnosis not present

## 2020-02-20 DIAGNOSIS — J441 Chronic obstructive pulmonary disease with (acute) exacerbation: Secondary | ICD-10-CM | POA: Diagnosis not present

## 2020-02-20 DIAGNOSIS — D4100 Neoplasm of uncertain behavior of unspecified kidney: Secondary | ICD-10-CM | POA: Diagnosis not present

## 2020-02-20 DIAGNOSIS — E782 Mixed hyperlipidemia: Secondary | ICD-10-CM | POA: Diagnosis not present

## 2020-02-20 DIAGNOSIS — C3490 Malignant neoplasm of unspecified part of unspecified bronchus or lung: Secondary | ICD-10-CM | POA: Diagnosis not present

## 2020-02-20 DIAGNOSIS — J449 Chronic obstructive pulmonary disease, unspecified: Secondary | ICD-10-CM | POA: Diagnosis not present

## 2020-03-01 DIAGNOSIS — J449 Chronic obstructive pulmonary disease, unspecified: Secondary | ICD-10-CM | POA: Diagnosis not present

## 2020-03-14 DIAGNOSIS — E782 Mixed hyperlipidemia: Secondary | ICD-10-CM | POA: Diagnosis not present

## 2020-03-14 DIAGNOSIS — J449 Chronic obstructive pulmonary disease, unspecified: Secondary | ICD-10-CM | POA: Diagnosis not present

## 2020-03-14 DIAGNOSIS — J441 Chronic obstructive pulmonary disease with (acute) exacerbation: Secondary | ICD-10-CM | POA: Diagnosis not present

## 2020-03-14 DIAGNOSIS — I10 Essential (primary) hypertension: Secondary | ICD-10-CM | POA: Diagnosis not present

## 2020-03-14 DIAGNOSIS — E7800 Pure hypercholesterolemia, unspecified: Secondary | ICD-10-CM | POA: Diagnosis not present

## 2020-03-30 DIAGNOSIS — J441 Chronic obstructive pulmonary disease with (acute) exacerbation: Secondary | ICD-10-CM | POA: Diagnosis not present

## 2020-03-30 DIAGNOSIS — I10 Essential (primary) hypertension: Secondary | ICD-10-CM | POA: Diagnosis not present

## 2020-03-30 DIAGNOSIS — J449 Chronic obstructive pulmonary disease, unspecified: Secondary | ICD-10-CM | POA: Diagnosis not present

## 2020-03-30 DIAGNOSIS — E7800 Pure hypercholesterolemia, unspecified: Secondary | ICD-10-CM | POA: Diagnosis not present

## 2020-03-30 DIAGNOSIS — C3490 Malignant neoplasm of unspecified part of unspecified bronchus or lung: Secondary | ICD-10-CM | POA: Diagnosis not present

## 2020-03-30 DIAGNOSIS — E782 Mixed hyperlipidemia: Secondary | ICD-10-CM | POA: Diagnosis not present

## 2020-03-30 DIAGNOSIS — D4100 Neoplasm of uncertain behavior of unspecified kidney: Secondary | ICD-10-CM | POA: Diagnosis not present

## 2020-04-01 DIAGNOSIS — J449 Chronic obstructive pulmonary disease, unspecified: Secondary | ICD-10-CM | POA: Diagnosis not present

## 2020-04-04 DIAGNOSIS — Z01419 Encounter for gynecological examination (general) (routine) without abnormal findings: Secondary | ICD-10-CM | POA: Diagnosis not present

## 2020-04-04 DIAGNOSIS — N750 Cyst of Bartholin's gland: Secondary | ICD-10-CM | POA: Diagnosis not present

## 2020-04-28 DIAGNOSIS — Z1231 Encounter for screening mammogram for malignant neoplasm of breast: Secondary | ICD-10-CM | POA: Diagnosis not present

## 2020-05-01 DIAGNOSIS — J449 Chronic obstructive pulmonary disease, unspecified: Secondary | ICD-10-CM | POA: Diagnosis not present

## 2020-05-03 DIAGNOSIS — E782 Mixed hyperlipidemia: Secondary | ICD-10-CM | POA: Diagnosis not present

## 2020-05-03 DIAGNOSIS — J449 Chronic obstructive pulmonary disease, unspecified: Secondary | ICD-10-CM | POA: Diagnosis not present

## 2020-05-03 DIAGNOSIS — E7800 Pure hypercholesterolemia, unspecified: Secondary | ICD-10-CM | POA: Diagnosis not present

## 2020-05-03 DIAGNOSIS — I2510 Atherosclerotic heart disease of native coronary artery without angina pectoris: Secondary | ICD-10-CM | POA: Diagnosis not present

## 2020-05-03 DIAGNOSIS — I10 Essential (primary) hypertension: Secondary | ICD-10-CM | POA: Diagnosis not present

## 2020-05-03 DIAGNOSIS — D4100 Neoplasm of uncertain behavior of unspecified kidney: Secondary | ICD-10-CM | POA: Diagnosis not present

## 2020-05-03 DIAGNOSIS — C3490 Malignant neoplasm of unspecified part of unspecified bronchus or lung: Secondary | ICD-10-CM | POA: Diagnosis not present

## 2020-05-03 DIAGNOSIS — J441 Chronic obstructive pulmonary disease with (acute) exacerbation: Secondary | ICD-10-CM | POA: Diagnosis not present

## 2020-05-07 ENCOUNTER — Telehealth: Payer: Self-pay | Admitting: *Deleted

## 2020-05-07 NOTE — Telephone Encounter (Signed)
Called patient to ask about rescheduling fu on 05-17-20 due to Dr. Sondra Come being off, rescheduled for 05-31-20 @ 8:30 am, patient agreed to new date and time

## 2020-05-14 ENCOUNTER — Encounter (HOSPITAL_COMMUNITY): Payer: Self-pay

## 2020-05-14 ENCOUNTER — Other Ambulatory Visit: Payer: Self-pay

## 2020-05-14 ENCOUNTER — Ambulatory Visit (HOSPITAL_COMMUNITY)
Admission: RE | Admit: 2020-05-14 | Discharge: 2020-05-14 | Disposition: A | Discharge status: Home / Self Care | Payer: PPO | Source: Ambulatory Visit | Attending: Radiation Oncology | Admitting: Radiation Oncology

## 2020-05-14 DIAGNOSIS — J984 Other disorders of lung: Secondary | ICD-10-CM | POA: Diagnosis not present

## 2020-05-14 DIAGNOSIS — J439 Emphysema, unspecified: Secondary | ICD-10-CM | POA: Diagnosis not present

## 2020-05-14 DIAGNOSIS — I2510 Atherosclerotic heart disease of native coronary artery without angina pectoris: Secondary | ICD-10-CM | POA: Diagnosis not present

## 2020-05-14 DIAGNOSIS — R918 Other nonspecific abnormal finding of lung field: Secondary | ICD-10-CM | POA: Diagnosis not present

## 2020-05-14 DIAGNOSIS — Z85118 Personal history of other malignant neoplasm of bronchus and lung: Secondary | ICD-10-CM | POA: Diagnosis not present

## 2020-05-17 ENCOUNTER — Ambulatory Visit: Payer: Self-pay | Admitting: Radiation Oncology

## 2020-05-30 ENCOUNTER — Telehealth: Payer: Self-pay | Admitting: *Deleted

## 2020-05-30 ENCOUNTER — Telehealth: Payer: Self-pay

## 2020-05-30 NOTE — Telephone Encounter (Signed)
1145 Returned patient's call stating she had been exposed to Covid 9 days ago. She denies any sx. She has had 2 Covid vaccines and has not been tested for the exposure. After talking with Dr. Sondra Come, patient advised that he would prefer to cancel her f/u appointment tomorrow and see her next week. Patient verbalized understanding and advised Enid Derry, our secretary will call her to reschedule with Dr.K inard.

## 2020-05-30 NOTE — Telephone Encounter (Signed)
Called patient to alter fu on 05-31-20 due to being exposed to Covid, rescheduled for 06-04-20 @ 11:45 am, patient agreed to this day and time

## 2020-05-31 ENCOUNTER — Ambulatory Visit: Payer: PPO | Admitting: Radiation Oncology

## 2020-05-31 DIAGNOSIS — J441 Chronic obstructive pulmonary disease with (acute) exacerbation: Secondary | ICD-10-CM | POA: Diagnosis not present

## 2020-05-31 DIAGNOSIS — J449 Chronic obstructive pulmonary disease, unspecified: Secondary | ICD-10-CM | POA: Diagnosis not present

## 2020-05-31 DIAGNOSIS — C3490 Malignant neoplasm of unspecified part of unspecified bronchus or lung: Secondary | ICD-10-CM | POA: Diagnosis not present

## 2020-05-31 DIAGNOSIS — I2510 Atherosclerotic heart disease of native coronary artery without angina pectoris: Secondary | ICD-10-CM | POA: Diagnosis not present

## 2020-05-31 DIAGNOSIS — D4100 Neoplasm of uncertain behavior of unspecified kidney: Secondary | ICD-10-CM | POA: Diagnosis not present

## 2020-05-31 DIAGNOSIS — I10 Essential (primary) hypertension: Secondary | ICD-10-CM | POA: Diagnosis not present

## 2020-05-31 DIAGNOSIS — E782 Mixed hyperlipidemia: Secondary | ICD-10-CM | POA: Diagnosis not present

## 2020-05-31 DIAGNOSIS — E7800 Pure hypercholesterolemia, unspecified: Secondary | ICD-10-CM | POA: Diagnosis not present

## 2020-06-01 DIAGNOSIS — J449 Chronic obstructive pulmonary disease, unspecified: Secondary | ICD-10-CM | POA: Diagnosis not present

## 2020-06-04 ENCOUNTER — Ambulatory Visit
Admission: RE | Admit: 2020-06-04 | Discharge: 2020-06-04 | Disposition: A | Discharge status: Home / Self Care | Payer: PPO | Source: Ambulatory Visit | Attending: Radiation Oncology | Admitting: Radiation Oncology

## 2020-06-04 ENCOUNTER — Other Ambulatory Visit: Payer: Self-pay

## 2020-06-04 DIAGNOSIS — Z923 Personal history of irradiation: Secondary | ICD-10-CM | POA: Diagnosis not present

## 2020-06-04 DIAGNOSIS — D3502 Benign neoplasm of left adrenal gland: Secondary | ICD-10-CM | POA: Insufficient documentation

## 2020-06-04 DIAGNOSIS — R918 Other nonspecific abnormal finding of lung field: Secondary | ICD-10-CM | POA: Insufficient documentation

## 2020-06-04 DIAGNOSIS — J449 Chronic obstructive pulmonary disease, unspecified: Secondary | ICD-10-CM | POA: Diagnosis not present

## 2020-06-04 DIAGNOSIS — Z7951 Long term (current) use of inhaled steroids: Secondary | ICD-10-CM | POA: Insufficient documentation

## 2020-06-04 DIAGNOSIS — I2510 Atherosclerotic heart disease of native coronary artery without angina pectoris: Secondary | ICD-10-CM | POA: Insufficient documentation

## 2020-06-04 DIAGNOSIS — Z08 Encounter for follow-up examination after completed treatment for malignant neoplasm: Secondary | ICD-10-CM | POA: Diagnosis not present

## 2020-06-04 DIAGNOSIS — K7689 Other specified diseases of liver: Secondary | ICD-10-CM | POA: Diagnosis not present

## 2020-06-04 DIAGNOSIS — C3490 Malignant neoplasm of unspecified part of unspecified bronchus or lung: Secondary | ICD-10-CM | POA: Diagnosis not present

## 2020-06-04 DIAGNOSIS — N200 Calculus of kidney: Secondary | ICD-10-CM | POA: Diagnosis not present

## 2020-06-04 DIAGNOSIS — Z79899 Other long term (current) drug therapy: Secondary | ICD-10-CM | POA: Diagnosis not present

## 2020-06-04 DIAGNOSIS — J439 Emphysema, unspecified: Secondary | ICD-10-CM | POA: Diagnosis not present

## 2020-06-04 DIAGNOSIS — R911 Solitary pulmonary nodule: Secondary | ICD-10-CM | POA: Diagnosis not present

## 2020-06-04 DIAGNOSIS — E7800 Pure hypercholesterolemia, unspecified: Secondary | ICD-10-CM | POA: Diagnosis not present

## 2020-06-04 NOTE — Progress Notes (Signed)
Radiation Oncology         [phone] ________________________________  Name: Brandy Morales MRN: [id number]  Date: 06/04/2020  DOB: 04-14-1946  Follow-Up Visit Note  CC: [name], MD  Brand Males, MD    ICD-10-CM   1. Pulmonary nodules  R91.8 CT Chest Wo Contrast    Diagnosis: PET avid right upper lobe spiculated pulmonary nodules concerning for primary bronchogenic carcinoma  Interval Since Last Radiation: Four months, four weeks, and one day  Radiation Treatment Dates: 12/29/2019 through 01/05/2020 Site Technique Total Dose (Gy) Dose per Fx (Gy) Completed Fx Beam Energies  Lung, Right: Lung_Rt IMRT 54/54 18 3/3 6XFFF    Narrative:  The patient returns today for routine follow-up. Chest CT scan on 05/14/2020 showed a slight decrease in the size of the spiculated nodule in the right upper lobe along the major fissure with associated surrounding subtle ground-glass likely related to post-treatment changes. There was also a spiculated density in the right upper lobe along the pleural surface with mainly linear morphologic features, which was stable when compared to the most recent comparison in April of 2021; may represent an area of scarring. Left adrenal adenoma and hepatic cysts were stable.  On review of systems, she reports her breathing is  stable. She denies pain within the chest area cough or hemoptysis.  ALLERGIES:  is allergic to statins and latex.  Meds: Current Outpatient Medications  Medication Sig Dispense Refill  . acyclovir (ZOVIRAX) 400 MG tablet Take 400 mg by mouth 3 (three) times daily as needed (for outbreak).    . Artificial Tear Solution (GENTEAL TEARS OP) Place 1 drop into both eyes daily.     Marland Kitchen BREO ELLIPTA 200-25 MCG/INH AEPB Inhale 1 puff into the lungs daily.    . cetirizine (ZYRTEC) 10 MG tablet Take 10 mg by mouth daily.    . Cholecalciferol (VITAMIN D3) 5000 units CAPS Take 5,000 Units by mouth daily.    . citalopram (CELEXA) 20 MG  tablet Take 20 mg by mouth daily.    Marland Kitchen COENZYME Q10 PO Take 10 mLs by mouth daily.     . fluticasone (FLONASE) 50 MCG/ACT nasal spray Place 1 spray into both nostrils daily.     Marland Kitchen losartan-hydrochlorothiazide (HYZAAR) 100-25 MG tablet Take 0.5 tablets by mouth daily.    . Multiple Vitamins-Minerals (MULTIVITAMIN PO) Take 6 each by mouth daily.     . naproxen sodium (ALEVE) 220 MG tablet Take 220 mg by mouth daily.    Marland Kitchen omeprazole (PRILOSEC) 20 MG capsule Take 20 mg by mouth daily.     . OXYGEN Inhale 2 L into the lungs continuous.     Marland Kitchen tiotropium (SPIRIVA) 18 MCG inhalation capsule Place 1 capsule (18 mcg total) into inhaler and inhale daily. 30 capsule 6  . ezetimibe (ZETIA) 10 MG tablet Take 10 mg by mouth daily.  (Patient not taking: No sig reported)    . levalbuterol (XOPENEX) 1.25 MG/3ML nebulizer solution Use 1 vial in nebulizer up to every 8 hour as needed for difficulty breathing (Patient not taking: Reported on 06/04/2020)     No current facility-administered medications for this encounter.    Physical Findings: The patient is in no acute distress. Patient is alert and oriented.  height is 5\' 3"  (1.6 m) and weight is 173 lb 8 oz (78.7 kg). Her temporal temperature is 96.8 F (36 C) (abnormal). Her blood pressure is 115/78 and her pulse is 52 (abnormal). Her respiration is 20 and oxygen  saturation is 94%.    Lungs are clear to auscultation bilaterally. Heart has regular rate and rhythm. No palpable cervical, supraclavicular, or axillary adenopathy. Abdomen soft, non-tender, normal bowel sounds. Oxygen in place.   Lab Findings: Lab Results  Component Value Date   WBC 6.8 02/01/2018   HGB 14.9 02/01/2018   HCT 45.8 02/01/2018   MCV 89.6 02/01/2018   PLT 157 02/01/2018    Radiographic Findings: CT Chest Wo Contrast  Result Date: 05/15/2020 CLINICAL DATA:  History of lung cancer diagnosed in June of 2021 with radiotherapy reportedly beginning in August of 2021 in this  75 year old female EXAM: CT CHEST WITHOUT CONTRAST TECHNIQUE: Multidetector CT imaging of the chest was performed following the standard protocol without IV contrast. COMPARISON:  November 17, 2019 FINDINGS: Cardiovascular: Calcified atheromatous plaque in the thoracic aorta. No aneurysmal dilation. Three-vessel coronary artery disease. Normal heart size. Normal caliber central pulmonary vasculature. Limited assessment of cardiovascular structures given lack of intravenous contrast. Mediastinum/Nodes: Esophagus grossly normal. No thoracic inlet lymphadenopathy. No axillary lymphadenopathy. No gross hilar lymphadenopathy. Lungs/Pleura: Pulmonary emphysema. Spiculated pulmonary nodule in the RIGHT upper lobe along the fissure measures 11 x 7 mm. This previously measured approximately 14 x 9 mm. Subtle ground-glass surrounds this area on the current study. Scarring in the lingula. Calcified nodular density in the LEFT lower lobe. Airways are patent. Background marked pulmonary emphysema. Spiculated density in the RIGHT upper lobe along the pleural surface mainly linear morphologic features measuring approximately 10 mm greatest axial dimension, stable compared to the most recent comparison imaging evaluation and within 1 mm of its size in April of 2021. Upper Abdomen: Stable hepatic cysts. Liver is incompletely imaged as are remaining upper abdominal viscera. A low-density lesion in the LEFT adrenal gland with well-circumscribed margins measuring 2.8 cm previously characterized as LEFT adrenal adenoma showing stable size. RIGHT adrenal is normal. Renal calculus on the RIGHT. Renal cortical scarring on the LEFT partially imaged. Musculoskeletal: No acute bone finding. No destructive bone process. Stable small sclerotic focus in the LEFT fifth rib laterally. IMPRESSION: 1. Slight decrease in size of spiculated nodule in the RIGHT upper lobe along the major fissure now with associated surrounding subtle ground-glass likely  related to post treatment changes. 2. Spiculated density in the RIGHT upper lobe along the pleural surface mainly linear morphologic features, stable compared to the most recent comparison imaging evaluation and within 1 mm of its size in April of 2021. This may represent an area of scarring. Given patient history suggest attention on follow-up. 3. Stable LEFT adrenal adenoma. 4. Stable hepatic cysts. 5. Renal calculus on the RIGHT. 6. Three-vessel coronary artery disease. 7. Emphysema and aortic atherosclerosis. Aortic Atherosclerosis (ICD10-I70.0) and Emphysema (ICD10-J43.9). Electronically Signed   By: Zetta Bills M.D.   On: 05/15/2020 08:42    Impression: PET avid right upper lobe spiculated pulmonary nodule concerning for primary bronchogenic carcinoma  Recent chest CT scan showed a slight decrease in the size of the spiculated nodule in the right upper lobe along the major fissure with associated surrounding subtle ground-glass likely related to post-treatment changes. There was also a spiculated density in the right upper lobe along the pleural surface with mainly linear morphologic features, which was stable when compared to the most recent comparison in April of 2021; may represent an area of scarring. Left adrenal adenoma and hepatic cysts were stable.  Favorable response to SBRT thus far.  Plan: The patient will follow-up with radiation oncology in 6 months.  Prior  to this visit the patient will undergo another chest CT scan.  The patient was given a copy of her chest CT scan to forward to Dr. Inda Merlin her primary care physician.  The patient is concerned about the coronary artery disease noted on her chest CT scan and I recommended she discuss this with Dr. Inda Merlin.  She does have an appoint with with him this week.  Patient reports been intolerant of statins but I did discuss with her that there are other potential options.  Total time spent in this encounter was 20 minutes which included  reviewing the patient's most recent chest CT scan, physical examination, and documentation.  ____________________________________   Blair Promise, PhD, MD  This document serves as a record of services personally performed by Gery Pray, MD. It was created on his behalf by Clerance Lav, a trained medical scribe. The creation of this record is based on the scribe's personal observations and the provider's statements to them. This document has been checked and approved by the attending provider.

## 2020-06-04 NOTE — Progress Notes (Signed)
Patient is here today for follow up from SBRT to lung that was completed August 2021.  She reports dizziness that comes on in spells that she feels faint and almost passes out but not actually as of yet.  She states that she uses oxygen and while sitting drops oxygen to 1 liter but when she gets up to ambulate and fails to increase her oxygen that is when she feels the dizziness.  Shortness of breath with oxygen in use with exertion during  ambulation.    Appetite is well.  Reports some fatigue.  Denies any skin issues post radiation.  Denies having any pain today.   Vitals:   06/04/20 1138  BP: 115/78  Pulse: (!) 52  Resp: 20  Temp: (!) 96.8 F (36 C)  TempSrc: Temporal  SpO2: 94%  Weight: 173 lb 8 oz (78.7 kg)  Height: 5\' 3"  (1.6 m)

## 2020-06-21 DIAGNOSIS — R918 Other nonspecific abnormal finding of lung field: Secondary | ICD-10-CM | POA: Diagnosis not present

## 2020-06-21 DIAGNOSIS — J449 Chronic obstructive pulmonary disease, unspecified: Secondary | ICD-10-CM | POA: Diagnosis not present

## 2020-06-21 DIAGNOSIS — Z9981 Dependence on supplemental oxygen: Secondary | ICD-10-CM | POA: Diagnosis not present

## 2020-06-21 DIAGNOSIS — Z87891 Personal history of nicotine dependence: Secondary | ICD-10-CM | POA: Diagnosis not present

## 2020-06-21 DIAGNOSIS — J9611 Chronic respiratory failure with hypoxia: Secondary | ICD-10-CM | POA: Diagnosis not present

## 2020-06-29 DIAGNOSIS — J449 Chronic obstructive pulmonary disease, unspecified: Secondary | ICD-10-CM | POA: Diagnosis not present

## 2020-06-29 DIAGNOSIS — J441 Chronic obstructive pulmonary disease with (acute) exacerbation: Secondary | ICD-10-CM | POA: Diagnosis not present

## 2020-06-29 DIAGNOSIS — E7800 Pure hypercholesterolemia, unspecified: Secondary | ICD-10-CM | POA: Diagnosis not present

## 2020-06-29 DIAGNOSIS — C3490 Malignant neoplasm of unspecified part of unspecified bronchus or lung: Secondary | ICD-10-CM | POA: Diagnosis not present

## 2020-06-29 DIAGNOSIS — E782 Mixed hyperlipidemia: Secondary | ICD-10-CM | POA: Diagnosis not present

## 2020-06-29 DIAGNOSIS — I10 Essential (primary) hypertension: Secondary | ICD-10-CM | POA: Diagnosis not present

## 2020-06-29 DIAGNOSIS — I2510 Atherosclerotic heart disease of native coronary artery without angina pectoris: Secondary | ICD-10-CM | POA: Diagnosis not present

## 2020-06-29 DIAGNOSIS — D4100 Neoplasm of uncertain behavior of unspecified kidney: Secondary | ICD-10-CM | POA: Diagnosis not present

## 2020-07-02 DIAGNOSIS — J449 Chronic obstructive pulmonary disease, unspecified: Secondary | ICD-10-CM | POA: Diagnosis not present

## 2020-07-06 ENCOUNTER — Encounter (HOSPITAL_COMMUNITY): Payer: Self-pay | Admitting: *Deleted

## 2020-07-06 DIAGNOSIS — Z79899 Other long term (current) drug therapy: Secondary | ICD-10-CM | POA: Diagnosis not present

## 2020-07-06 DIAGNOSIS — N132 Hydronephrosis with renal and ureteral calculous obstruction: Secondary | ICD-10-CM | POA: Diagnosis not present

## 2020-07-06 DIAGNOSIS — Z888 Allergy status to other drugs, medicaments and biological substances status: Secondary | ICD-10-CM | POA: Diagnosis not present

## 2020-07-06 DIAGNOSIS — Z791 Long term (current) use of non-steroidal anti-inflammatories (NSAID): Secondary | ICD-10-CM | POA: Diagnosis not present

## 2020-07-06 DIAGNOSIS — Z87891 Personal history of nicotine dependence: Secondary | ICD-10-CM | POA: Diagnosis not present

## 2020-07-06 DIAGNOSIS — Z9981 Dependence on supplemental oxygen: Secondary | ICD-10-CM | POA: Diagnosis not present

## 2020-07-06 DIAGNOSIS — Z87442 Personal history of urinary calculi: Secondary | ICD-10-CM | POA: Diagnosis not present

## 2020-07-06 DIAGNOSIS — K7689 Other specified diseases of liver: Secondary | ICD-10-CM | POA: Diagnosis not present

## 2020-07-06 DIAGNOSIS — Z91040 Latex allergy status: Secondary | ICD-10-CM | POA: Diagnosis not present

## 2020-07-06 DIAGNOSIS — N200 Calculus of kidney: Secondary | ICD-10-CM | POA: Diagnosis not present

## 2020-07-06 DIAGNOSIS — J449 Chronic obstructive pulmonary disease, unspecified: Secondary | ICD-10-CM | POA: Diagnosis not present

## 2020-07-06 DIAGNOSIS — N390 Urinary tract infection, site not specified: Secondary | ICD-10-CM | POA: Diagnosis not present

## 2020-07-06 DIAGNOSIS — Z7951 Long term (current) use of inhaled steroids: Secondary | ICD-10-CM | POA: Diagnosis not present

## 2020-07-06 DIAGNOSIS — I10 Essential (primary) hypertension: Secondary | ICD-10-CM | POA: Diagnosis not present

## 2020-07-06 NOTE — Progress Notes (Signed)
Received referral from Dr. Earley Abide at  [facility] for this pt to participate in pulmonary rehab with the the diagnosis of Chronic Respiratory Failure with Hypoxia. Clinical review of pt follow up appt on 06/21/20  Pulmonary office note establishing care.  Also reviewed progress notes with Dr. Sondra Come Radiation Oncologist on 1/10. Pt with Covid Risk Score - 3. Pt appropriate for scheduling for Pulmonary rehab.  Will forward to support staff for scheduling and verification of insurance eligibility/benefits with pt consent. Cherre Huger, BSN Cardiac and Training and development officer

## 2020-07-13 DIAGNOSIS — R1084 Generalized abdominal pain: Secondary | ICD-10-CM | POA: Diagnosis not present

## 2020-07-13 DIAGNOSIS — N201 Calculus of ureter: Secondary | ICD-10-CM | POA: Diagnosis not present

## 2020-07-13 DIAGNOSIS — N130 Hydronephrosis with ureteropelvic junction obstruction: Secondary | ICD-10-CM | POA: Diagnosis not present

## 2020-07-20 DIAGNOSIS — N202 Calculus of kidney with calculus of ureter: Secondary | ICD-10-CM | POA: Diagnosis not present

## 2020-07-24 DIAGNOSIS — E782 Mixed hyperlipidemia: Secondary | ICD-10-CM | POA: Diagnosis not present

## 2020-07-24 DIAGNOSIS — J449 Chronic obstructive pulmonary disease, unspecified: Secondary | ICD-10-CM | POA: Diagnosis not present

## 2020-07-24 DIAGNOSIS — E7800 Pure hypercholesterolemia, unspecified: Secondary | ICD-10-CM | POA: Diagnosis not present

## 2020-07-24 DIAGNOSIS — C3490 Malignant neoplasm of unspecified part of unspecified bronchus or lung: Secondary | ICD-10-CM | POA: Diagnosis not present

## 2020-07-24 DIAGNOSIS — J441 Chronic obstructive pulmonary disease with (acute) exacerbation: Secondary | ICD-10-CM | POA: Diagnosis not present

## 2020-07-24 DIAGNOSIS — I10 Essential (primary) hypertension: Secondary | ICD-10-CM | POA: Diagnosis not present

## 2020-07-24 DIAGNOSIS — I2510 Atherosclerotic heart disease of native coronary artery without angina pectoris: Secondary | ICD-10-CM | POA: Diagnosis not present

## 2020-07-24 DIAGNOSIS — D4100 Neoplasm of uncertain behavior of unspecified kidney: Secondary | ICD-10-CM | POA: Diagnosis not present

## 2020-07-27 ENCOUNTER — Encounter (HOSPITAL_COMMUNITY): Payer: Self-pay

## 2020-07-27 ENCOUNTER — Telehealth (HOSPITAL_COMMUNITY): Payer: Self-pay

## 2020-07-27 NOTE — Telephone Encounter (Signed)
Attempted to call patient in regards to Pulmonary Rehab - unable to leave VM.  Mailed letter

## 2020-07-27 NOTE — Telephone Encounter (Signed)
Pt insurance is active and benefits verified through [employer]. Co-pay $5.00, DED $0.00/$0.00 met, out of pocket $3,450.00/$0.00 met, co-insurance 0%. No pre-authorization required. Yaimee/[employer], 07/27/20 @ 211PM, BTD#[id number]

## 2020-07-30 DIAGNOSIS — J449 Chronic obstructive pulmonary disease, unspecified: Secondary | ICD-10-CM | POA: Diagnosis not present

## 2020-08-13 ENCOUNTER — Telehealth (HOSPITAL_COMMUNITY): Payer: Self-pay

## 2020-08-13 NOTE — Telephone Encounter (Signed)
No response from pt.  Closed referral  

## 2020-08-20 ENCOUNTER — Telehealth (HOSPITAL_COMMUNITY): Payer: Self-pay

## 2020-08-20 NOTE — Telephone Encounter (Signed)
Pt called back and stated that she is interested in the pulmonary rehab program. She stated that she received a letter from [place] and that she is interested in participating. I opened pt pulmonary rehab referral back up and placed her referral in the ready to schedule section, I advised pt that we are scheduling into May and that the May schedule is not complete yet and will call her to schedule once the May schedule is ready.

## 2020-08-28 DIAGNOSIS — Z961 Presence of intraocular lens: Secondary | ICD-10-CM | POA: Diagnosis not present

## 2020-08-28 DIAGNOSIS — H524 Presbyopia: Secondary | ICD-10-CM | POA: Diagnosis not present

## 2020-08-28 DIAGNOSIS — H349 Unspecified retinal vascular occlusion: Secondary | ICD-10-CM | POA: Diagnosis not present

## 2020-08-28 DIAGNOSIS — H52203 Unspecified astigmatism, bilateral: Secondary | ICD-10-CM | POA: Diagnosis not present

## 2020-08-30 DIAGNOSIS — J449 Chronic obstructive pulmonary disease, unspecified: Secondary | ICD-10-CM | POA: Diagnosis not present

## 2020-09-05 NOTE — Telephone Encounter (Signed)
Called patient to see if she was interested in participating in the Pulmonary Rehab Program. Patient stated yes. Patient will come in for orientation on 09/24/2020@9 :00am and will attend the 1:15pm exercise class.  Tourist information centre manager.

## 2020-09-10 DIAGNOSIS — I10 Essential (primary) hypertension: Secondary | ICD-10-CM | POA: Diagnosis not present

## 2020-09-10 DIAGNOSIS — I2510 Atherosclerotic heart disease of native coronary artery without angina pectoris: Secondary | ICD-10-CM | POA: Diagnosis not present

## 2020-09-10 DIAGNOSIS — J441 Chronic obstructive pulmonary disease with (acute) exacerbation: Secondary | ICD-10-CM | POA: Diagnosis not present

## 2020-09-10 DIAGNOSIS — E7800 Pure hypercholesterolemia, unspecified: Secondary | ICD-10-CM | POA: Diagnosis not present

## 2020-09-10 DIAGNOSIS — E782 Mixed hyperlipidemia: Secondary | ICD-10-CM | POA: Diagnosis not present

## 2020-09-10 DIAGNOSIS — J449 Chronic obstructive pulmonary disease, unspecified: Secondary | ICD-10-CM | POA: Diagnosis not present

## 2020-09-10 DIAGNOSIS — D4100 Neoplasm of uncertain behavior of unspecified kidney: Secondary | ICD-10-CM | POA: Diagnosis not present

## 2020-09-10 DIAGNOSIS — C3490 Malignant neoplasm of unspecified part of unspecified bronchus or lung: Secondary | ICD-10-CM | POA: Diagnosis not present

## 2020-09-14 DIAGNOSIS — I6523 Occlusion and stenosis of bilateral carotid arteries: Secondary | ICD-10-CM | POA: Diagnosis not present

## 2020-09-14 DIAGNOSIS — I10 Essential (primary) hypertension: Secondary | ICD-10-CM | POA: Diagnosis not present

## 2020-09-14 DIAGNOSIS — E7800 Pure hypercholesterolemia, unspecified: Secondary | ICD-10-CM | POA: Diagnosis not present

## 2020-09-14 DIAGNOSIS — E785 Hyperlipidemia, unspecified: Secondary | ICD-10-CM | POA: Diagnosis not present

## 2020-09-14 DIAGNOSIS — H34239 Retinal artery branch occlusion, unspecified eye: Secondary | ICD-10-CM | POA: Diagnosis not present

## 2020-09-20 ENCOUNTER — Telehealth (HOSPITAL_COMMUNITY): Payer: Self-pay | Admitting: *Deleted

## 2020-09-21 DIAGNOSIS — N200 Calculus of kidney: Secondary | ICD-10-CM | POA: Diagnosis not present

## 2020-09-21 DIAGNOSIS — I10 Essential (primary) hypertension: Secondary | ICD-10-CM | POA: Diagnosis not present

## 2020-09-21 DIAGNOSIS — Z01419 Encounter for gynecological examination (general) (routine) without abnormal findings: Secondary | ICD-10-CM | POA: Diagnosis not present

## 2020-09-21 DIAGNOSIS — E782 Mixed hyperlipidemia: Secondary | ICD-10-CM | POA: Diagnosis not present

## 2020-09-21 DIAGNOSIS — Z0001 Encounter for general adult medical examination with abnormal findings: Secondary | ICD-10-CM | POA: Diagnosis not present

## 2020-09-21 DIAGNOSIS — C3490 Malignant neoplasm of unspecified part of unspecified bronchus or lung: Secondary | ICD-10-CM | POA: Diagnosis not present

## 2020-09-21 DIAGNOSIS — H34239 Retinal artery branch occlusion, unspecified eye: Secondary | ICD-10-CM | POA: Diagnosis not present

## 2020-09-21 DIAGNOSIS — J449 Chronic obstructive pulmonary disease, unspecified: Secondary | ICD-10-CM | POA: Diagnosis not present

## 2020-09-24 ENCOUNTER — Other Ambulatory Visit: Payer: Self-pay

## 2020-09-24 ENCOUNTER — Encounter (HOSPITAL_COMMUNITY)
Admission: RE | Admit: 2020-09-24 | Discharge: 2020-09-24 | Disposition: A | Discharge status: Home / Self Care | Payer: PPO | Source: Ambulatory Visit | Attending: Cardiology | Admitting: Cardiology

## 2020-09-24 VITALS — BP 109/78 | HR 104 | Ht 64.0 in | Wt 172.4 lb

## 2020-09-24 DIAGNOSIS — J9611 Chronic respiratory failure with hypoxia: Secondary | ICD-10-CM | POA: Insufficient documentation

## 2020-09-24 NOTE — Progress Notes (Signed)
Brandy Morales 74 y.o. female Pulmonary Rehab Orientation Note This patient who was referred to Pulmonary rehab by Dr. Camillo Flaming with the diagnosis of chronic respiratory failure with hypoxia arrived today in Cardiac and Pulmonary Rehab. She arrived ambulatory with slightly unsteady gait. She does carry portable oxygen. [employer] is the provider for their DME. Per pt, she uses oxygen continuously. Color good, skin warm and dry. Patient is oriented to time and place. Patient's medical history, psychosocial health, and medications reviewed. Psychosocial assessment reveals pt lives alone. Pt is currently retired. Pt hobbies include taking to shut in friends on the telephone daily. Pt reports her stress level is low. Areas of stress/anxiety include Health.  Pt does not exhibit  signs of depression. PHQ2/9 score 0/3. Pt shows good  coping skills with positive outlook .  Offered emotional support and reassurance. Will continue to monitor and evaluate progress toward psychosocial goal(s) of continued mental wellbeing and ability to handle stress in healthy ways. Physical assessment reveals heart rate is tachycardic, breath sounds clear to auscultation, no wheezes, rales, or rhonchi, diminished. Grip strength equal, strong. Distal pulses 3+ bilateral posterior tibial pulses present without peripheral edema. Patient reports she does take medications as prescribed. Patient states she follows a Regular diet. The patient has been trying to lose weight through a healthy diet and exercise program..She has lost 8 pounds over the last year. Patient's weight will be monitored closely. Demonstration and practice of PLB using pulse oximeter. Patient able to return demonstration satisfactorily. Safety and hand hygiene in the exercise area reviewed with patient. Patient voices understanding of the information reviewed. Department expectations discussed with patient and achievable goals were set. The patient shows enthusiasm about attending the  program and we look forward to working with this nice lady. The patient completed a 6 min walk test today, 09/24/2020  and to begin exercise on Tuesday 10/02/2020 in the 1:15 pm class.Marland Kitchen  [phone]

## 2020-09-24 NOTE — Progress Notes (Signed)
Pulmonary Individual Treatment Plan  Patient Details  Name: Jory Welke MRN: [id number] Date of Birth: 27-Apr-1946 Referring Provider:   [name] Pulmonary Rehab Walk Test from 09/24/2020 in [place]  Referring Provider Dr. Camillo Flaming      Initial Encounter Date:  Flowsheet Row Pulmonary Rehab Walk Test from 09/24/2020 in [employer]  Date 09/24/20      Visit Diagnosis: Chronic respiratory failure with hypoxia ([place])  Patient's Home Medications on Admission:   Current Outpatient Medications:  .  acyclovir (ZOVIRAX) 400 MG tablet, Take 400 mg by mouth 3 (three) times daily as needed (for outbreak)., Disp: , Rfl:  .  Artificial Tear Solution (GENTEAL TEARS OP), Place 1 drop into both eyes daily. , Disp: , Rfl:  .  BREO ELLIPTA 200-25 MCG/INH AEPB, Inhale 1 puff into the lungs daily., Disp: , Rfl:  .  cetirizine (ZYRTEC) 10 MG tablet, Take 10 mg by mouth daily., Disp: , Rfl:  .  Cholecalciferol (VITAMIN D3) 5000 units CAPS, Take 5,000 Units by mouth daily., Disp: , Rfl:  .  citalopram (CELEXA) 20 MG tablet, Take 20 mg by mouth daily., Disp: , Rfl:  .  COENZYME Q10 PO, Take 10 mLs by mouth daily. , Disp: , Rfl:  .  ezetimibe (ZETIA) 10 MG tablet, Take 10 mg by mouth daily., Disp: , Rfl:  .  fluticasone (FLONASE) 50 MCG/ACT nasal spray, Place 1 spray into both nostrils daily. , Disp: , Rfl:  .  levalbuterol (XOPENEX) 1.25 MG/3ML nebulizer solution, , Disp: , Rfl:  .  losartan-hydrochlorothiazide (HYZAAR) 100-25 MG tablet, Take 0.5 tablets by mouth daily., Disp: , Rfl:  .  Multiple Vitamins-Minerals (MULTIVITAMIN PO), Take 6 each by mouth daily. , Disp: , Rfl:  .  naproxen sodium (ALEVE) 220 MG tablet, Take 220 mg by mouth daily., Disp: , Rfl:  .  omeprazole (PRILOSEC) 20 MG capsule, Take 20 mg by mouth daily. , Disp: , Rfl:  .  OXYGEN, Inhale 2 L into the lungs continuous. , Disp: , Rfl:  .  tiotropium (SPIRIVA) 18 MCG  inhalation capsule, Place 1 capsule (18 mcg total) into inhaler and inhale daily., Disp: 30 capsule, Rfl: 6  Past Medical History: Past Medical History:  Diagnosis Date  . Anxiety   . Asthma   . COPD (chronic obstructive pulmonary disease) ([place])   . GERD (gastroesophageal reflux disease)   . History of kidney stones   . Hypertension     Tobacco Use: Social History   Tobacco Use  Smoking Status Former Smoker  . Packs/day: 2.00  . Years: 55.00  . Pack years: 110.00  . Types: Cigarettes  . Quit date: 2016  . Years since quitting: 6.3  Smokeless Tobacco Never Used    Labs: Recent Review Flowsheet Data   There is no flowsheet data to display.     Capillary Blood Glucose: Lab Results  Component Value Date   GLUCAP 81 10/12/2019   GLUCAP 91 05/11/2017     Pulmonary Assessment Scores:  Pulmonary Assessment Scores    Row Name 09/24/20 1001 09/24/20 1139       ADL UCSD   ADL Phase Entry Entry    SOB Score total 75 --         CAT Score   CAT Score 24 --         mMRC Score   mMRC Score -- 4  UCSD: Self-administered rating of dyspnea associated with activities of daily living (ADLs) 6-point scale (0 = "not at all" to 5 = "maximal or unable to do because of breathlessness")  Scoring Scores range from 0 to 120.  Minimally important difference is 5 units  CAT: CAT can identify the health impairment of COPD patients and is better correlated with disease progression.  CAT has a scoring range of zero to 40. The CAT score is classified into four groups of low (less than 10), medium (10 - 20), high (21-30) and very high (31-40) based on the impact level of disease on health status. A CAT score over 10 suggests significant symptoms.  A worsening CAT score could be explained by an exacerbation, poor medication adherence, poor inhaler technique, or progression of COPD or comorbid conditions.  CAT MCID is 2 points  mMRC: mMRC (Modified Medical Research Council)  Dyspnea Scale is used to assess the degree of baseline functional disability in patients of respiratory disease due to dyspnea. No minimal important difference is established. A decrease in score of 1 point or greater is considered a positive change.   Pulmonary Function Assessment:  Pulmonary Function Assessment - 09/24/20 1001      Breath   Bilateral Breath Sounds Clear;Decreased    Shortness of Breath Yes;Limiting activity           Exercise Target Goals: Exercise Program Goal: Individual exercise prescription set using results from initial 6 min walk test and THRR while considering  patient's activity barriers and safety.   Exercise Prescription Goal: Initial exercise prescription builds to 30-45 minutes a day of aerobic activity, 2-3 days per week.  Home exercise guidelines will be given to patient during program as part of exercise prescription that the participant will acknowledge.  Activity Barriers & Risk Stratification:  Activity Barriers & Cardiac Risk Stratification - 09/24/20 0945      Activity Barriers & Cardiac Risk Stratification   Activity Barriers Arthritis;Deconditioning;Muscular Weakness;Shortness of Breath           6 Minute Walk:  6 Minute Walk    Row Name 09/24/20 1130         6 Minute Walk   Phase Initial     Distance 899 feet     Walk Time 6 minutes     # of Rest Breaks 0     MPH 1.7     METS 1.97     RPE 13     Perceived Dyspnea  2     VO2 Peak 6.89     Symptoms No     Resting HR 95 bpm     Resting BP 114/79     Resting Oxygen Saturation  94 %     Exercise Oxygen Saturation  during 6 min walk 87 %     Max Ex. HR 113 bpm     Max Ex. BP 134/83     2 Minute Post BP 107/79           Interval HR   1 Minute HR 99     2 Minute HR 99     3 Minute HR 105     4 Minute HR 110     5 Minute HR 106     6 Minute HR 113     2 Minute Post HR 99     Interval Heart Rate? Yes           Interval Oxygen   Interval Oxygen? Yes  Baseline  Oxygen Saturation % 94 %     1 Minute Oxygen Saturation % 91 %     1 Minute Liters of Oxygen 2 L     2 Minute Oxygen Saturation % 87 %     2 Minute Liters of Oxygen 2 L  Increased to 3L     3 Minute Oxygen Saturation % 89 %     3 Minute Liters of Oxygen 3 L     4 Minute Oxygen Saturation % 89 %     4 Minute Liters of Oxygen 3 L     5 Minute Oxygen Saturation % 94 %     5 Minute Liters of Oxygen 3 L     6 Minute Oxygen Saturation % 91 %     6 Minute Liters of Oxygen 3 L     2 Minute Post Oxygen Saturation % 94 %     2 Minute Post Liters of Oxygen 3 L            Oxygen Initial Assessment:  Oxygen Initial Assessment - 09/24/20 1000      Home Oxygen   Home Oxygen Device Portable Concentrator;Home Concentrator    Home Exercise Oxygen Prescription Pulsed    Home Resting Oxygen Prescription Pulsed    Liters per minute 2    Compliance with Home Oxygen Use Yes      Initial 6 min Walk   Oxygen Used Continuous    Liters per minute 2   Had to increase to 3L after 2 minutes     Program Oxygen Prescription   Program Oxygen Prescription Continuous    Liters per minute 3    Comments May be able to do seated exercise with 2L, but needs to be on 3L with walking      Intervention   Short Term Goals To learn and exhibit compliance with exercise, home and travel O2 prescription;To learn and understand importance of monitoring SPO2 with pulse oximeter and demonstrate accurate use of the pulse oximeter.;To learn and understand importance of maintaining oxygen saturations>88%;To learn and demonstrate proper pursed lip breathing techniques or other breathing techniques.;To learn and demonstrate proper use of respiratory medications    Long  Term Goals Exhibits compliance with exercise, home and travel O2 prescription;Verbalizes importance of monitoring SPO2 with pulse oximeter and return demonstration;Maintenance of O2 saturations>88%;Exhibits proper breathing techniques, such as pursed lip  breathing or other method taught during program session;Compliance with respiratory medication;Demonstrates proper use of MDI's           Oxygen Re-Evaluation:   Oxygen Discharge (Final Oxygen Re-Evaluation):   Initial Exercise Prescription:  Initial Exercise Prescription - 09/24/20 1100      Date of Initial Exercise RX and Referring Provider   Date 09/24/20    Referring Provider Dr. Camillo Flaming    Expected Discharge Date 11/29/20      Oxygen   Oxygen Continuous    Liters 2-3      NuStep   Level 1    SPM 80    Minutes 15      Track   Minutes 15      Prescription Details   Frequency (times per week) 2    Duration Progress to 30 minutes of continuous aerobic without signs/symptoms of physical distress      Intensity   THRR 40-80% of Max Heartrate 58-117    Ratings of Perceived Exertion 11-13    Perceived Dyspnea 0-4      Progression  Progression Continue to progress workloads to maintain intensity without signs/symptoms of physical distress.      Resistance Training   Training Prescription Yes    Weight Red bands    Reps 10-15           Perform Capillary Blood Glucose checks as needed.  Exercise Prescription Changes:   Exercise Comments:   Exercise Goals and Review:  Exercise Goals    Row Name 09/24/20 1128             Exercise Goals   Increase Physical Activity Yes       Intervention Provide advice, education, support and counseling about physical activity/exercise needs.;Develop an individualized exercise prescription for aerobic and resistive training based on initial evaluation findings, risk stratification, comorbidities and participant's personal goals.       Expected Outcomes Short Term: Attend rehab on a regular basis to increase amount of physical activity.;Long Term: Add in home exercise to make exercise part of routine and to increase amount of physical activity.;Long Term: Exercising regularly at least 3-5 days a week.       Increase  Strength and Stamina Yes       Intervention Provide advice, education, support and counseling about physical activity/exercise needs.;Develop an individualized exercise prescription for aerobic and resistive training based on initial evaluation findings, risk stratification, comorbidities and participant's personal goals.       Expected Outcomes Short Term: Increase workloads from initial exercise prescription for resistance, speed, and METs.;Short Term: Perform resistance training exercises routinely during rehab and add in resistance training at home;Long Term: Improve cardiorespiratory fitness, muscular endurance and strength as measured by increased METs and functional capacity (6MWT)       Able to understand and use rate of perceived exertion (RPE) scale Yes       Intervention Provide education and explanation on how to use RPE scale       Expected Outcomes Short Term: Able to use RPE daily in rehab to express subjective intensity level;Long Term:  Able to use RPE to guide intensity level when exercising independently       Able to understand and use Dyspnea scale Yes       Intervention Provide education and explanation on how to use Dyspnea scale       Expected Outcomes Short Term: Able to use Dyspnea scale daily in rehab to express subjective sense of shortness of breath during exertion;Long Term: Able to use Dyspnea scale to guide intensity level when exercising independently       Knowledge and understanding of Target Heart Rate Range (THRR) Yes       Intervention Provide education and explanation of THRR including how the numbers were predicted and where they are located for reference       Expected Outcomes Short Term: Able to state/look up THRR;Long Term: Able to use THRR to govern intensity when exercising independently;Short Term: Able to use daily as guideline for intensity in rehab       Understanding of Exercise Prescription Yes       Intervention Provide education, explanation, and  written materials on patient's individual exercise prescription       Expected Outcomes Short Term: Able to explain program exercise prescription;Long Term: Able to explain home exercise prescription to exercise independently              Exercise Goals Re-Evaluation :   Discharge Exercise Prescription (Final Exercise Prescription Changes):   Nutrition:  Target Goals: Understanding of nutrition guidelines,  daily intake of sodium 1500mg , cholesterol 200mg , calories 30% from fat and 7% or less from saturated fats, daily to have 5 or more servings of fruits and vegetables.  Biometrics:  Pre Biometrics - 09/24/20 0945      Pre Biometrics   Height 5\' 4"  (1.626 m)    Weight 78.2 kg    BMI (Calculated) 29.58    Grip Strength 15 kg            Nutrition Therapy Plan and Nutrition Goals:   Nutrition Assessments:  MEDIFICTS Score Key:  ?70 Need to make dietary changes   40-70 Heart Healthy Diet  ? 40 Therapeutic Level Cholesterol Diet   Picture Your Plate Scores:  <58 Unhealthy dietary pattern with much room for improvement.  41-50 Dietary pattern unlikely to meet recommendations for good health and room for improvement.  51-60 More healthful dietary pattern, with some room for improvement.   >60 Healthy dietary pattern, although there may be some specific behaviors that could be improved.    Nutrition Goals Re-Evaluation:   Nutrition Goals Discharge (Final Nutrition Goals Re-Evaluation):   Psychosocial: Target Goals: Acknowledge presence or absence of significant depression and/or stress, maximize coping skills, provide positive support system. Participant is able to verbalize types and ability to use techniques and skills needed for reducing stress and depression.  Initial Review & Psychosocial Screening:  Initial Psych Review & Screening - 09/24/20 1022      Initial Review   Current issues with History of Depression   takes celexa for depression, has been  on it since her husband died.     Family Dynamics   Good Support System? Yes   her 2 step daughters     Barriers   Psychosocial barriers to participate in program There are no identifiable barriers or psychosocial needs.      Screening Interventions   Interventions Encouraged to exercise           Quality of Life Scores:  Scores of 19 and below usually indicate a poorer quality of life in these areas.  A difference of  2-3 points is a clinically meaningful difference.  A difference of 2-3 points in the total score of the Quality of Life Index has been associated with significant improvement in overall quality of life, self-image, physical symptoms, and general health in studies assessing change in quality of life.  PHQ-9: Recent Review Flowsheet Data    Depression screen [facility] 2/9 09/24/2020 09/24/2020 09/24/2020   Decreased Interest 0 - 0   Down, Depressed, Hopeless 0 - 0   PHQ - 2 Score 0 - 0   Altered sleeping - 2 -   Tired, decreased energy - 1 -   Change in appetite - 0 -   Feeling bad or failure about yourself  - 0 -   Trouble concentrating - 0 -   Moving slowly or fidgety/restless - 0 -   Suicidal thoughts - 0 -   Difficult doing work/chores - Not difficult at all -     Interpretation of Total Score  Total Score Depression Severity:  1-4 = Minimal depression, 5-9 = Mild depression, 10-14 = Moderate depression, 15-19 = Moderately severe depression, 20-27 = Severe depression   Psychosocial Evaluation and Intervention:  Psychosocial Evaluation - 09/24/20 1036      Psychosocial Evaluation & Interventions   Interventions Encouraged to exercise with the program and follow exercise prescription    Continue Psychosocial Services  No Follow up required  Psychosocial Re-Evaluation:   Psychosocial Discharge (Final Psychosocial Re-Evaluation):   Education: Education Goals: Education classes will be provided on a weekly basis, covering required topics. Participant  will state understanding/return demonstration of topics presented.  Learning Barriers/Preferences:   Education Topics: Risk Factor Reduction:  -Group instruction that is supported by a PowerPoint presentation. Instructor discusses the definition of a risk factor, different risk factors for pulmonary disease, and how the heart and lungs work together.     Nutrition for Pulmonary Patient:  -Group instruction provided by PowerPoint slides, verbal discussion, and written materials to support subject matter. The instructor gives an explanation and review of healthy diet recommendations, which includes a discussion on weight management, recommendations for fruit and vegetable consumption, as well as protein, fluid, caffeine, fiber, sodium, sugar, and alcohol. Tips for eating when patients are short of breath are discussed.   Pursed Lip Breathing:  -Group instruction that is supported by demonstration and informational handouts. Instructor discusses the benefits of pursed lip and diaphragmatic breathing and detailed demonstration on how to preform both.     Oxygen Safety:  -Group instruction provided by PowerPoint, verbal discussion, and written material to support subject matter. There is an overview of "What is Oxygen" and "Why do we need it".  Instructor also reviews how to create a safe environment for oxygen use, the importance of using oxygen as prescribed, and the risks of noncompliance. There is a brief discussion on traveling with oxygen and resources the patient may utilize.   Oxygen Equipment:  -Group instruction provided by [facility] Staff utilizing handouts, written materials, and equipment demonstrations.   Signs and Symptoms:  -Group instruction provided by written material and verbal discussion to support subject matter. Warning signs and symptoms of infection, stroke, and heart attack are reviewed and when to call the physician/911 reinforced. Tips for preventing the spread of  infection discussed.   Advanced Directives:  -Group instruction provided by verbal instruction and written material to support subject matter. Instructor reviews Advanced Directive laws and proper instruction for filling out document.   Pulmonary Video:  -Group video education that reviews the importance of medication and oxygen compliance, exercise, good nutrition, pulmonary hygiene, and pursed lip and diaphragmatic breathing for the pulmonary patient.   Exercise for the Pulmonary Patient:  -Group instruction that is supported by a PowerPoint presentation. Instructor discusses benefits of exercise, core components of exercise, frequency, duration, and intensity of an exercise routine, importance of utilizing pulse oximetry during exercise, safety while exercising, and options of places to exercise outside of rehab.     Pulmonary Medications:  -Verbally interactive group education provided by instructor with focus on inhaled medications and proper administration.   Anatomy and Physiology of the Respiratory System and Intimacy:  -Group instruction provided by PowerPoint, verbal discussion, and written material to support subject matter. Instructor reviews respiratory cycle and anatomical components of the respiratory system and their functions. Instructor also reviews differences in obstructive and restrictive respiratory diseases with examples of each. Intimacy, Sex, and Sexuality differences are reviewed with a discussion on how relationships can change when diagnosed with pulmonary disease. Common sexual concerns are reviewed.   MD DAY -A group question and answer session with a medical doctor that allows participants to ask questions that relate to their pulmonary disease state.   OTHER EDUCATION -Group or individual verbal, written, or video instructions that support the educational goals of the pulmonary rehab program.   Holiday Eating Survival Tips:  -Group instruction provided  by PowerPoint  slides, verbal discussion, and written materials to support subject matter. The instructor gives patients tips, tricks, and techniques to help them not only survive but enjoy the holidays despite the onslaught of food that accompanies the holidays.   Knowledge Questionnaire Score:  Knowledge Questionnaire Score - 09/24/20 1002      Knowledge Questionnaire Score   Pre Score 17/18           Core Components/Risk Factors/Patient Goals at Admission:  Personal Goals and Risk Factors at Admission - 09/24/20 1036      Core Components/Risk Factors/Patient Goals on Admission   Improve shortness of breath with ADL's Yes    Intervention Provide education, individualized exercise plan and daily activity instruction to help decrease symptoms of SOB with activities of daily living.    Expected Outcomes Short Term: Improve cardiorespiratory fitness to achieve a reduction of symptoms when performing ADLs;Long Term: Be able to perform more ADLs without symptoms or delay the onset of symptoms           Core Components/Risk Factors/Patient Goals Review:   Goals and Risk Factor Review    Row Name 09/24/20 1036             Core Components/Risk Factors/Patient Goals Review   Personal Goals Review Develop more efficient breathing techniques such as purse lipped breathing and diaphragmatic breathing and practicing self-pacing with activity.;Increase knowledge of respiratory medications and ability to use respiratory devices properly.;Improve shortness of breath with ADL's              Core Components/Risk Factors/Patient Goals at Discharge (Final Review):   Goals and Risk Factor Review - 09/24/20 1036      Core Components/Risk Factors/Patient Goals Review   Personal Goals Review Develop more efficient breathing techniques such as purse lipped breathing and diaphragmatic breathing and practicing self-pacing with activity.;Increase knowledge of respiratory medications and ability to  use respiratory devices properly.;Improve shortness of breath with ADL's           ITP Comments:   Comments:

## 2020-09-29 DIAGNOSIS — J449 Chronic obstructive pulmonary disease, unspecified: Secondary | ICD-10-CM | POA: Diagnosis not present

## 2020-10-02 ENCOUNTER — Other Ambulatory Visit: Payer: Self-pay

## 2020-10-02 ENCOUNTER — Encounter (HOSPITAL_COMMUNITY)
Admission: RE | Admit: 2020-10-02 | Discharge: 2020-10-02 | Disposition: A | Discharge status: Home / Self Care | Payer: PPO | Source: Ambulatory Visit | Attending: Cardiology | Admitting: Cardiology

## 2020-10-02 DIAGNOSIS — J9611 Chronic respiratory failure with hypoxia: Secondary | ICD-10-CM | POA: Diagnosis not present

## 2020-10-02 NOTE — Progress Notes (Signed)
Daily Session Note  Patient Details  Name: Brandy Morales MRN: [id number] Date of Birth: Mar 01, 1946 Referring Provider:   [name] Pulmonary Rehab Walk Test from 09/24/2020 in [place]  Referring Provider Dr. Camillo Flaming      Encounter Date: 10/02/2020  Check In:  Session Check In - 10/02/20 1415      Check-In   Supervising physician immediately available to respond to emergencies [facility] immediately available    Physician(s) Dr. Arbutus Ped    Location [facility]    Staff Present Rosebud Poles, RN, BSN;Nayib Remer Hassell Done, MS, ACSM-CEP, Exercise Physiologist;David Lilyan Punt, MS, ACSM-CEP, CCRP, Exercise Physiologist    Virtual Visit No    Medication changes reported     No    Fall or balance concerns reported    No    Tobacco Cessation No Change    Warm-up and Cool-down Performed on first and last piece of equipment    Resistance Training Performed Yes    VAD Patient? No    PAD/SET Patient? No      Pain Assessment   Currently in Pain? No/denies    Multiple Pain Sites No           Capillary Blood Glucose: No results found for this or any previous visit (from the past 24 hour(s)).    Social History   Tobacco Use  Smoking Status Former Smoker  . Packs/day: 2.00  . Years: 55.00  . Pack years: 110.00  . Types: Cigarettes  . Quit date: 2016  . Years since quitting: 6.3  Smokeless Tobacco Never Used    Goals Met:  Proper associated with RPD/PD & O2 Sat Exercise tolerated well No report of cardiac concerns or symptoms Strength training completed today  Goals Unmet:  Not Applicable  Comments: Service time is from 1315 to 1430    Dr. Fransico Him is Medical Director for Cardiac Rehab at [facility]

## 2020-10-04 ENCOUNTER — Other Ambulatory Visit: Payer: Self-pay

## 2020-10-04 ENCOUNTER — Encounter (HOSPITAL_COMMUNITY)
Admission: RE | Admit: 2020-10-04 | Discharge: 2020-10-04 | Disposition: A | Discharge status: Home / Self Care | Payer: PPO | Source: Ambulatory Visit | Attending: Cardiology | Admitting: Cardiology

## 2020-10-04 DIAGNOSIS — J9611 Chronic respiratory failure with hypoxia: Secondary | ICD-10-CM

## 2020-10-04 NOTE — Progress Notes (Signed)
Daily Session Note  Patient Details  Name: Brandy Morales MRN: [id number] Date of Birth: 07/20/1945 Referring Provider:   Flowsheet Row Pulmonary Rehab Walk Test from 09/24/2020 in [facility]  Referring Provider Dr. Ejaz      Encounter Date: 10/04/2020  Check In:  Session Check In - 10/04/20 1449      Check-In   Supervising physician immediately available to respond to emergencies [facility] immediately available    Physician(s) Dr. Nettey    Location [facility]    Staff Present Joan Behrens, RN, BSN;Jessica Martin, MS, ACSM-CEP, Exercise Physiologist;Annedrea Stackhouse, RN, MHA;Carlette Carlton, RN, BSN;David Makemson, MS, ACSM-CEP, CCRP, Exercise Physiologist    Virtual Visit No    Medication changes reported     No    Fall or balance concerns reported    No    Tobacco Cessation No Change    Warm-up and Cool-down Performed as group-led instruction    Resistance Training Performed Yes    VAD Patient? No    PAD/SET Patient? No      Pain Assessment   Currently in Pain? No/denies    Multiple Pain Sites No           Capillary Blood Glucose: No results found for this or any previous visit (from the past 24 hour(s)).    Social History   Tobacco Use  Smoking Status Former Smoker  . Packs/day: 2.00  . Years: 55.00  . Pack years: 110.00  . Types: Cigarettes  . Quit date: 2016  . Years since quitting: 6.3  Smokeless Tobacco Never Used    Goals Met:  Proper associated with RPD/PD & O2 Sat Exercise tolerated well Strength training completed today  Goals Unmet:  Not Applicable  Comments: Service time is from 1315 to 1430    Dr. Traci Turner is Medical Director for Cardiac Rehab at [facility]. 

## 2020-10-04 NOTE — Progress Notes (Signed)
Brandy Morales 75 y.o. female Nutrition Note  Diagnosis: chronic respiratory failure with hypoxia  Past Medical History:  Diagnosis Date  . Anxiety   . Asthma   . COPD (chronic obstructive pulmonary disease) (Person)   . GERD (gastroesophageal reflux disease)   . History of kidney stones   . Hypertension      Medications reviewed.   Current Outpatient Medications:  .  acyclovir (ZOVIRAX) 400 MG tablet, Take 400 mg by mouth 3 (three) times daily as needed (for outbreak)., Disp: , Rfl:  .  Artificial Tear Solution (GENTEAL TEARS OP), Place 1 drop into both eyes daily. , Disp: , Rfl:  .  BREO ELLIPTA 200-25 MCG/INH AEPB, Inhale 1 puff into the lungs daily., Disp: , Rfl:  .  cetirizine (ZYRTEC) 10 MG tablet, Take 10 mg by mouth daily., Disp: , Rfl:  .  Cholecalciferol (VITAMIN D3) 5000 units CAPS, Take 5,000 Units by mouth daily., Disp: , Rfl:  .  citalopram (CELEXA) 20 MG tablet, Take 20 mg by mouth daily., Disp: , Rfl:  .  COENZYME Q10 PO, Take 10 mLs by mouth daily. , Disp: , Rfl:  .  ezetimibe (ZETIA) 10 MG tablet, Take 10 mg by mouth daily., Disp: , Rfl:  .  fluticasone (FLONASE) 50 MCG/ACT nasal spray, Place 1 spray into both nostrils daily. , Disp: , Rfl:  .  levalbuterol (XOPENEX) 1.25 MG/3ML nebulizer solution, , Disp: , Rfl:  .  losartan-hydrochlorothiazide (HYZAAR) 100-25 MG tablet, Take 0.5 tablets by mouth daily., Disp: , Rfl:  .  Multiple Vitamins-Minerals (MULTIVITAMIN PO), Take 6 each by mouth daily. , Disp: , Rfl:  .  naproxen sodium (ALEVE) 220 MG tablet, Take 220 mg by mouth daily., Disp: , Rfl:  .  omeprazole (PRILOSEC) 20 MG capsule, Take 20 mg by mouth daily. , Disp: , Rfl:  .  OXYGEN, Inhale 2 L into the lungs continuous. , Disp: , Rfl:  .  tiotropium (SPIRIVA) 18 MCG inhalation capsule, Place 1 capsule (18 mcg total) into inhaler and inhale daily., Disp: 30 capsule, Rfl: 6   Ht Readings from Last 1 Encounters:  09/24/20 5\' 4"  (1.626 m)     Wt Readings from  Last 3 Encounters:  09/24/20 172 lb 6.4 oz (78.2 kg)  06/04/20 173 lb 8 oz (78.7 kg)  02/13/20 174 lb 12.8 oz (79.3 kg)     There is no height or weight on file to calculate BMI.   Social History   Tobacco Use  Smoking Status Former Smoker  . Packs/day: 2.00  . Years: 55.00  . Pack years: 110.00  . Types: Cigarettes  . Quit date: 2016  . Years since quitting: 6.3  Smokeless Tobacco Never Used      Nutrition Note  Spoke with pt. Nutrition Plan and Nutrition Survey goals reviewed with pt.   Pt has HTN and has reduced sodium intake. She eats out often to be social but she only eats 1/2 meal and takes 1/2 home. She uses light salt but tries to limit use.   Pt desires to lose weight. Reviewed weight hx. She has tried losing weight her whole life. She has tried every diet. Emphasized importance of creating healthy habits.  Pt does follow a healthy diet. She typically only eats 2 meals. We discussed foods to incorporate more often such as fruits, vegetables, low fat yogurt, and nuts.    Pt expressed understanding of the information reviewed.  Nutrition Diagnosis ? Food-and nutrition-related knowledge deficit related  to lack of exposure to information as related to diagnosis of: ? HTN and desire for sustainable weight loss   Nutrition Intervention ? Pt's individual nutrition plan reviewed with pt. ? Benefits of adopting healthy diet reviewed with Rate My Plate survey   ? Continue client-centered nutrition education by RD, as part of interdisciplinary care.  Goal(s) ? Pt to identify food quantities necessary to achieve weight loss of 6-24 lb at graduation from pulmonary rehab.  ? Pt to build a healthy plate including vegetables, fruits, whole grains, and low-fat dairy products in a heart healthy meal plan.  Plan:   Will provide client-centered nutrition education as part of interdisciplinary care  Monitor and evaluate progress toward nutrition goal with team.   Michaele Offer, MS, RDN, LDN

## 2020-10-09 ENCOUNTER — Encounter (HOSPITAL_COMMUNITY)
Admission: RE | Admit: 2020-10-09 | Discharge: 2020-10-09 | Disposition: A | Discharge status: Home / Self Care | Payer: PPO | Source: Ambulatory Visit | Attending: Cardiology | Admitting: Cardiology

## 2020-10-09 ENCOUNTER — Other Ambulatory Visit: Payer: Self-pay

## 2020-10-09 VITALS — Wt 173.3 lb

## 2020-10-09 DIAGNOSIS — J9611 Chronic respiratory failure with hypoxia: Secondary | ICD-10-CM

## 2020-10-09 NOTE — Progress Notes (Signed)
Daily Session Note  Patient Details  Name: Brandy Morales MRN: [id number] Date of Birth: 1946-03-02 Referring Provider:   [name] Pulmonary Rehab Walk Test from 09/24/2020 in [place]  Referring Provider Dr. Camillo Flaming      Encounter Date: 10/09/2020  Check In:  Session Check In - 10/09/20 1450      Check-In   Supervising physician immediately available to respond to emergencies [facility] immediately available    Physician(s) Dr. Teryl Lucy    Location [facility]    Staff Present Rosebud Poles, RN, Isaac Laud, MS, ACSM-CEP, Exercise Physiologist;Lisa Ysidro Evert, RN;Olinty Celesta Aver, MS, ACSM CEP, Exercise Physiologist    Virtual Visit No    Medication changes reported     No    Fall or balance concerns reported    No    Tobacco Cessation No Change    Warm-up and Cool-down Performed as group-led instruction    Resistance Training Performed Yes    VAD Patient? No    PAD/SET Patient? No           Capillary Blood Glucose: No results found for this or any previous visit (from the past 24 hour(s)).   Exercise Prescription Changes - 10/09/20 1500      Response to Exercise   Blood Pressure (Admit) 98/64    Blood Pressure (Exercise) 100/66    Blood Pressure (Exit) 102/82    Heart Rate (Admit) 97 bpm    Heart Rate (Exercise) 106 bpm    Heart Rate (Exit) 113 bpm    Oxygen Saturation (Admit) 95 %    Oxygen Saturation (Exercise) 92 %    Oxygen Saturation (Exit) 97 %    Rating of Perceived Exertion (Exercise) 15    Perceived Dyspnea (Exercise) 3    Duration Progress to 30 minutes of  aerobic without signs/symptoms of physical distress    Intensity --   40-80% HRR     Resistance Training   Training Prescription Yes    Weight Red bands    Reps 10-15    Time 10 Minutes      Oxygen   Oxygen Continuous    Liters 3      NuStep   Level 1    SPM 80    Minutes 15    METs 1.3      Track   Laps 9    Minutes 15            Social History   Tobacco Use  Smoking Status Former Smoker  . Packs/day: 2.00  . Years: 55.00  . Pack years: 110.00  . Types: Cigarettes  . Quit date: 2016  . Years since quitting: 6.3  Smokeless Tobacco Never Used    Goals Met:  Proper associated with RPD/PD & O2 Sat Exercise tolerated well Strength training completed today  Goals Unmet:  Not Applicable  Comments: Service time is from 1315 to 1425    Dr. Fransico Him is Medical Director for Cardiac Rehab at [facility]

## 2020-10-11 ENCOUNTER — Encounter (HOSPITAL_COMMUNITY): Payer: PPO

## 2020-10-16 ENCOUNTER — Encounter (HOSPITAL_COMMUNITY)
Admission: RE | Admit: 2020-10-16 | Discharge: 2020-10-16 | Disposition: A | Discharge status: Home / Self Care | Payer: PPO | Source: Ambulatory Visit | Attending: Cardiology | Admitting: Cardiology

## 2020-10-16 ENCOUNTER — Other Ambulatory Visit: Payer: Self-pay

## 2020-10-16 DIAGNOSIS — J9611 Chronic respiratory failure with hypoxia: Secondary | ICD-10-CM

## 2020-10-16 NOTE — Progress Notes (Signed)
Daily Session Note  Patient Details  Name: Brandy Morales MRN: [id number] Date of Birth: 1946-05-09 Referring Provider:   [name] Pulmonary Rehab Walk Test from 09/24/2020 in [place]  Referring Provider Dr. Camillo Flaming      Encounter Date: 10/16/2020  Check In:  Session Check In - 10/16/20 1422      Check-In   Supervising physician immediately available to respond to emergencies [facility] immediately available    Physician(s) Dr. Maren Beach    Location [facility]    Staff Present Rodney Langton, RN;Olinty Celesta Aver, MS, ACSM CEP, Exercise Physiologist;Jessica Hassell Done, MS, ACSM-CEP, Exercise Physiologist    Virtual Visit No    Medication changes reported     No    Fall or balance concerns reported    No    Tobacco Cessation No Change    Warm-up and Cool-down Performed as group-led instruction    Resistance Training Performed Yes    VAD Patient? No    PAD/SET Patient? No      Pain Assessment   Currently in Pain? No/denies    Multiple Pain Sites No           Capillary Blood Glucose: No results found for this or any previous visit (from the past 24 hour(s)).    Social History   Tobacco Use  Smoking Status Former Smoker  . Packs/day: 2.00  . Years: 55.00  . Pack years: 110.00  . Types: Cigarettes  . Quit date: 2016  . Years since quitting: 6.3  Smokeless Tobacco Never Used    Goals Met:  Exercise tolerated well No report of cardiac concerns or symptoms Strength training completed today  Goals Unmet:  Not Applicable  Comments: Service time is from 1315 to 1455    Dr. Fransico Him is Medical Director for Cardiac Rehab at [facility]

## 2020-10-16 NOTE — Progress Notes (Signed)
Pulmonary Individual Treatment Plan  Patient Details  Name: Brandy Morales MRN: [id number] Date of Birth: 08-05-1945 Referring Provider:   [name] Pulmonary Rehab Walk Test from 09/24/2020 in [facility]  Referring Provider Dr. Camillo Flaming      Initial Encounter Date:  Flowsheet Row Pulmonary Rehab Walk Test from 09/24/2020 in [facility]  Date 09/24/20      Visit Diagnosis: Chronic respiratory failure with hypoxia ([place])  Patient's Home Medications on Admission:   Current Outpatient Medications:  .  acyclovir (ZOVIRAX) 400 MG tablet, Take 400 mg by mouth 3 (three) times daily as needed (for outbreak)., Disp: , Rfl:  .  Artificial Tear Solution (GENTEAL TEARS OP), Place 1 drop into both eyes daily. , Disp: , Rfl:  .  BREO ELLIPTA 200-25 MCG/INH AEPB, Inhale 1 puff into the lungs daily., Disp: , Rfl:  .  cetirizine (ZYRTEC) 10 MG tablet, Take 10 mg by mouth daily., Disp: , Rfl:  .  Cholecalciferol (VITAMIN D3) 5000 units CAPS, Take 5,000 Units by mouth daily., Disp: , Rfl:  .  citalopram (CELEXA) 20 MG tablet, Take 20 mg by mouth daily., Disp: , Rfl:  .  COENZYME Q10 PO, Take 10 mLs by mouth daily. , Disp: , Rfl:  .  ezetimibe (ZETIA) 10 MG tablet, Take 10 mg by mouth daily., Disp: , Rfl:  .  fluticasone (FLONASE) 50 MCG/ACT nasal spray, Place 1 spray into both nostrils daily. , Disp: , Rfl:  .  levalbuterol (XOPENEX) 1.25 MG/3ML nebulizer solution, , Disp: , Rfl:  .  losartan-hydrochlorothiazide (HYZAAR) 100-25 MG tablet, Take 0.5 tablets by mouth daily., Disp: , Rfl:  .  Multiple Vitamins-Minerals (MULTIVITAMIN PO), Take 6 each by mouth daily. , Disp: , Rfl:  .  naproxen sodium (ALEVE) 220 MG tablet, Take 220 mg by mouth daily., Disp: , Rfl:  .  omeprazole (PRILOSEC) 20 MG capsule, Take 20 mg by mouth daily. , Disp: , Rfl:  .  OXYGEN, Inhale 2 L into the lungs continuous. , Disp: , Rfl:  .  tiotropium (SPIRIVA) 18 MCG  inhalation capsule, Place 1 capsule (18 mcg total) into inhaler and inhale daily., Disp: 30 capsule, Rfl: 6  Past Medical History: Past Medical History:  Diagnosis Date  . Anxiety   . Asthma   . COPD (chronic obstructive pulmonary disease) ([place])   . GERD (gastroesophageal reflux disease)   . History of kidney stones   . Hypertension     Tobacco Use: Social History   Tobacco Use  Smoking Status Former Smoker  . Packs/day: 2.00  . Years: 55.00  . Pack years: 110.00  . Types: Cigarettes  . Quit date: 2016  . Years since quitting: 6.3  Smokeless Tobacco Never Used    Labs: Recent Review Flowsheet Data   There is no flowsheet data to display.     Capillary Blood Glucose: Lab Results  Component Value Date   GLUCAP 81 10/12/2019   GLUCAP 91 05/11/2017     Pulmonary Assessment Scores:  Pulmonary Assessment Scores    Row Name 09/24/20 1001 09/24/20 1139       ADL UCSD   ADL Phase Entry Entry    SOB Score total 75 --         CAT Score   CAT Score 24 --         mMRC Score   mMRC Score -- 4  UCSD: Self-administered rating of dyspnea associated with activities of daily living (ADLs) 6-point scale (0 = "not at all" to 5 = "maximal or unable to do because of breathlessness")  Scoring Scores range from 0 to 120.  Minimally important difference is 5 units  CAT: CAT can identify the health impairment of COPD patients and is better correlated with disease progression.  CAT has a scoring range of zero to 40. The CAT score is classified into four groups of low (less than 10), medium (10 - 20), high (21-30) and very high (31-40) based on the impact level of disease on health status. A CAT score over 10 suggests significant symptoms.  A worsening CAT score could be explained by an exacerbation, poor medication adherence, poor inhaler technique, or progression of COPD or comorbid conditions.  CAT MCID is 2 points  mMRC: mMRC (Modified Medical Research Council)  Dyspnea Scale is used to assess the degree of baseline functional disability in patients of respiratory disease due to dyspnea. No minimal important difference is established. A decrease in score of 1 point or greater is considered a positive change.   Pulmonary Function Assessment:  Pulmonary Function Assessment - 09/24/20 1001      Breath   Bilateral Breath Sounds Clear;Decreased    Shortness of Breath Yes;Limiting activity           Exercise Target Goals: Exercise Program Goal: Individual exercise prescription set using results from initial 6 min walk test and THRR while considering  patient's activity barriers and safety.   Exercise Prescription Goal: Initial exercise prescription builds to 30-45 minutes a day of aerobic activity, 2-3 days per week.  Home exercise guidelines will be given to patient during program as part of exercise prescription that the participant will acknowledge.  Activity Barriers & Risk Stratification:  Activity Barriers & Cardiac Risk Stratification - 09/24/20 0945      Activity Barriers & Cardiac Risk Stratification   Activity Barriers Arthritis;Deconditioning;Muscular Weakness;Shortness of Breath           6 Minute Walk:  6 Minute Walk    Row Name 09/24/20 1130         6 Minute Walk   Phase Initial     Distance 899 feet     Walk Time 6 minutes     # of Rest Breaks 0     MPH 1.7     METS 1.97     RPE 13     Perceived Dyspnea  2     VO2 Peak 6.89     Symptoms No     Resting HR 95 bpm     Resting BP 114/79     Resting Oxygen Saturation  94 %     Exercise Oxygen Saturation  during 6 min walk 87 %     Max Ex. HR 113 bpm     Max Ex. BP 134/83     2 Minute Post BP 107/79           Interval HR   1 Minute HR 99     2 Minute HR 99     3 Minute HR 105     4 Minute HR 110     5 Minute HR 106     6 Minute HR 113     2 Minute Post HR 99     Interval Heart Rate? Yes           Interval Oxygen   Interval Oxygen? Yes  Baseline  Oxygen Saturation % 94 %     1 Minute Oxygen Saturation % 91 %     1 Minute Liters of Oxygen 2 L     2 Minute Oxygen Saturation % 87 %     2 Minute Liters of Oxygen 2 L  Increased to 3L     3 Minute Oxygen Saturation % 89 %     3 Minute Liters of Oxygen 3 L     4 Minute Oxygen Saturation % 89 %     4 Minute Liters of Oxygen 3 L     5 Minute Oxygen Saturation % 94 %     5 Minute Liters of Oxygen 3 L     6 Minute Oxygen Saturation % 91 %     6 Minute Liters of Oxygen 3 L     2 Minute Post Oxygen Saturation % 94 %     2 Minute Post Liters of Oxygen 3 L            Oxygen Initial Assessment:  Oxygen Initial Assessment - 09/24/20 1000      Home Oxygen   Home Oxygen Device Portable Concentrator;Home Concentrator    Home Exercise Oxygen Prescription Pulsed    Home Resting Oxygen Prescription Pulsed    Liters per minute 2    Compliance with Home Oxygen Use Yes      Initial 6 min Walk   Oxygen Used Continuous    Liters per minute 2   Had to increase to 3L after 2 minutes     Program Oxygen Prescription   Program Oxygen Prescription Continuous    Liters per minute 3    Comments May be able to do seated exercise with 2L, but needs to be on 3L with walking      Intervention   Short Term Goals To learn and exhibit compliance with exercise, home and travel O2 prescription;To learn and understand importance of monitoring SPO2 with pulse oximeter and demonstrate accurate use of the pulse oximeter.;To learn and understand importance of maintaining oxygen saturations>88%;To learn and demonstrate proper pursed lip breathing techniques or other breathing techniques.;To learn and demonstrate proper use of respiratory medications    Long  Term Goals Exhibits compliance with exercise, home and travel O2 prescription;Verbalizes importance of monitoring SPO2 with pulse oximeter and return demonstration;Maintenance of O2 saturations>88%;Exhibits proper breathing techniques, such as pursed lip  breathing or other method taught during program session;Compliance with respiratory medication;Demonstrates proper use of MDI's           Oxygen Re-Evaluation:  Oxygen Re-Evaluation    Row Name 10/16/20 0835             Program Oxygen Prescription   Program Oxygen Prescription Continuous       Liters per minute 3               Home Oxygen   Home Oxygen Device Portable Concentrator;Home Concentrator       Home Exercise Oxygen Prescription Pulsed       Liters per minute 2       Home Resting Oxygen Prescription Pulsed       Liters per minute 2       Compliance with Home Oxygen Use Yes               Goals/Expected Outcomes   Short Term Goals To learn and exhibit compliance with exercise, home and travel O2 prescription;To learn and understand importance of monitoring SPO2 with  pulse oximeter and demonstrate accurate use of the pulse oximeter.;To learn and understand importance of maintaining oxygen saturations>88%;To learn and demonstrate proper pursed lip breathing techniques or other breathing techniques.;To learn and demonstrate proper use of respiratory medications       Long  Term Goals Exhibits compliance with exercise, home and travel O2 prescription;Verbalizes importance of monitoring SPO2 with pulse oximeter and return demonstration;Maintenance of O2 saturations>88%;Exhibits proper breathing techniques, such as pursed lip breathing or other method taught during program session;Compliance with respiratory medication;Demonstrates proper use of MDI's       Goals/Expected Outcomes compliance and understanding of oxygen saturation and pursed lip breathing.              Oxygen Discharge (Final Oxygen Re-Evaluation):  Oxygen Re-Evaluation - 10/16/20 0835      Program Oxygen Prescription   Program Oxygen Prescription Continuous    Liters per minute 3      Home Oxygen   Home Oxygen Device Portable Concentrator;Home Concentrator    Home Exercise Oxygen Prescription Pulsed     Liters per minute 2    Home Resting Oxygen Prescription Pulsed    Liters per minute 2    Compliance with Home Oxygen Use Yes      Goals/Expected Outcomes   Short Term Goals To learn and exhibit compliance with exercise, home and travel O2 prescription;To learn and understand importance of monitoring SPO2 with pulse oximeter and demonstrate accurate use of the pulse oximeter.;To learn and understand importance of maintaining oxygen saturations>88%;To learn and demonstrate proper pursed lip breathing techniques or other breathing techniques.;To learn and demonstrate proper use of respiratory medications    Long  Term Goals Exhibits compliance with exercise, home and travel O2 prescription;Verbalizes importance of monitoring SPO2 with pulse oximeter and return demonstration;Maintenance of O2 saturations>88%;Exhibits proper breathing techniques, such as pursed lip breathing or other method taught during program session;Compliance with respiratory medication;Demonstrates proper use of MDI's    Goals/Expected Outcomes compliance and understanding of oxygen saturation and pursed lip breathing.           Initial Exercise Prescription:  Initial Exercise Prescription - 09/24/20 1100      Date of Initial Exercise RX and Referring Provider   Date 09/24/20    Referring Provider Dr. Su Monks    Expected Discharge Date 11/29/20      Oxygen   Oxygen Continuous    Liters 2-3      NuStep   Level 1    SPM 80    Minutes 15      Track   Minutes 15      Prescription Details   Frequency (times per week) 2    Duration Progress to 30 minutes of continuous aerobic without signs/symptoms of physical distress      Intensity   THRR 40-80% of Max Heartrate 58-117    Ratings of Perceived Exertion 11-13    Perceived Dyspnea 0-4      Progression   Progression Continue to progress workloads to maintain intensity without signs/symptoms of physical distress.      Resistance Training   Training  Prescription Yes    Weight Red bands    Reps 10-15           Perform Capillary Blood Glucose checks as needed.  Exercise Prescription Changes:  Exercise Prescription Changes    Row Name 10/09/20 1500             Response to Exercise   Blood Pressure (Admit) 98/64  Blood Pressure (Exercise) 100/66       Blood Pressure (Exit) 102/82       Heart Rate (Admit) 97 bpm       Heart Rate (Exercise) 106 bpm       Heart Rate (Exit) 113 bpm       Oxygen Saturation (Admit) 95 %       Oxygen Saturation (Exercise) 92 %       Oxygen Saturation (Exit) 97 %       Rating of Perceived Exertion (Exercise) 15       Perceived Dyspnea (Exercise) 3       Duration Progress to 30 minutes of  aerobic without signs/symptoms of physical distress       Intensity --  40-80% HRR               Resistance Training   Training Prescription Yes       Weight Red bands       Reps 10-15       Time 10 Minutes               Oxygen   Oxygen Continuous       Liters 3               NuStep   Level 1       SPM 80       Minutes 15       METs 1.3               Track   Laps 9       Minutes 15              Exercise Comments:  Exercise Comments    Row Name 10/02/20 1447           Exercise Comments Pt completed first day of exercise and tolerated well for the most part with no concerns. She is very deconditioned and gets very short of breath. She was able to do 15 minutes on the Nustep and 15 minutes on the track but did have to take several rest breaks due to SOB. She needed cueing for pursed lip breathing several times, especially with walking. She tolerated the resistance training and stretches well with no complaints.              Exercise Goals and Review:  Exercise Goals    Row Name 09/24/20 1128             Exercise Goals   Increase Physical Activity Yes       Intervention Provide advice, education, support and counseling about physical activity/exercise needs.;Develop an  individualized exercise prescription for aerobic and resistive training based on initial evaluation findings, risk stratification, comorbidities and participant's personal goals.       Expected Outcomes Short Term: Attend rehab on a regular basis to increase amount of physical activity.;Long Term: Add in home exercise to make exercise part of routine and to increase amount of physical activity.;Long Term: Exercising regularly at least 3-5 days a week.       Increase Strength and Stamina Yes       Intervention Provide advice, education, support and counseling about physical activity/exercise needs.;Develop an individualized exercise prescription for aerobic and resistive training based on initial evaluation findings, risk stratification, comorbidities and participant's personal goals.       Expected Outcomes Short Term: Increase workloads from initial exercise prescription for resistance, speed, and METs.;Short Term: Perform resistance training exercises routinely  during rehab and add in resistance training at home;Long Term: Improve cardiorespiratory fitness, muscular endurance and strength as measured by increased METs and functional capacity (6MWT)       Able to understand and use rate of perceived exertion (RPE) scale Yes       Intervention Provide education and explanation on how to use RPE scale       Expected Outcomes Short Term: Able to use RPE daily in rehab to express subjective intensity level;Long Term:  Able to use RPE to guide intensity level when exercising independently       Able to understand and use Dyspnea scale Yes       Intervention Provide education and explanation on how to use Dyspnea scale       Expected Outcomes Short Term: Able to use Dyspnea scale daily in rehab to express subjective sense of shortness of breath during exertion;Long Term: Able to use Dyspnea scale to guide intensity level when exercising independently       Knowledge and understanding of Target Heart Rate Range  (THRR) Yes       Intervention Provide education and explanation of THRR including how the numbers were predicted and where they are located for reference       Expected Outcomes Short Term: Able to state/look up THRR;Long Term: Able to use THRR to govern intensity when exercising independently;Short Term: Able to use daily as guideline for intensity in rehab       Understanding of Exercise Prescription Yes       Intervention Provide education, explanation, and written materials on patient's individual exercise prescription       Expected Outcomes Short Term: Able to explain program exercise prescription;Long Term: Able to explain home exercise prescription to exercise independently              Exercise Goals Re-Evaluation :  Exercise Goals Re-Evaluation    [place] Name 10/16/20 2395             Exercise Goal Re-Evaluation   Exercise Goals Review Increase Physical Activity;Increase Strength and Stamina;Able to understand and use rate of perceived exertion (RPE) scale;Able to understand and use Dyspnea scale;Knowledge and understanding of Target Heart Rate Range (THRR);Understanding of Exercise Prescription       Comments Patient has completed 3 exercise sessions and has tolerated well so far. She is very deconditioned but has been working hard so far. She is able to do the Nustep for 15 minutes with no rest breaks and is exercising at 1.3 METS. She also walks the track for 15 minutes but does have to take a few rest breaks during that time due to fatigue and shortness of breath. She is exercising at 2 METS walking the track. Will continue to monitor and progress as she is able.       Expected Outcomes Through exercise at rehab and home, the patient will decrease shortness of breath with daily activities and feel confident in carrying out an exercise regimn at home.              Discharge Exercise Prescription (Final Exercise Prescription Changes):  Exercise Prescription Changes - 10/09/20  1500      Response to Exercise   Blood Pressure (Admit) 98/64    Blood Pressure (Exercise) 100/66    Blood Pressure (Exit) 102/82    Heart Rate (Admit) 97 bpm    Heart Rate (Exercise) 106 bpm    Heart Rate (Exit) 113 bpm    Oxygen Saturation (  Admit) 95 %    Oxygen Saturation (Exercise) 92 %    Oxygen Saturation (Exit) 97 %    Rating of Perceived Exertion (Exercise) 15    Perceived Dyspnea (Exercise) 3    Duration Progress to 30 minutes of  aerobic without signs/symptoms of physical distress    Intensity --   40-80% HRR     Resistance Training   Training Prescription Yes    Weight Red bands    Reps 10-15    Time 10 Minutes      Oxygen   Oxygen Continuous    Liters 3      NuStep   Level 1    SPM 80    Minutes 15    METs 1.3      Track   Laps 9    Minutes 15           Nutrition:  Target Goals: Understanding of nutrition guidelines, daily intake of sodium '1500mg'$ , cholesterol '200mg'$ , calories 30% from fat and 7% or less from saturated fats, daily to have 5 or more servings of fruits and vegetables.  Biometrics:  Pre Biometrics - 09/24/20 0945      Pre Biometrics   Height $Remov'5\' 4"'Quhgnu$  (1.626 m)    Weight 78.2 kg    BMI (Calculated) 29.58    Grip Strength 15 kg            Nutrition Therapy Plan and Nutrition Goals:  Nutrition Therapy & Goals - 10/04/20 1433      Nutrition Therapy   Diet Generally Healthful      Personal Nutrition Goals   Nutrition Goal Pt to identify food quantities necessary to achieve weight loss of 6-24 lb at graduation from pulmonary rehab.    Personal Goal #2 Pt to build a healthy plate including vegetables, fruits, whole grains, and low-fat dairy products in a heart healthy meal plan.      Intervention Plan   Intervention Prescribe, educate and counsel regarding individualized specific dietary modifications aiming towards targeted core components such as weight, hypertension, lipid management, diabetes, heart failure and other  comorbidities.    Expected Outcomes Long Term Goal: Adherence to prescribed nutrition plan.;Short Term Goal: Understand basic principles of dietary content, such as calories, fat, sodium, cholesterol and nutrients.           Nutrition Assessments:  MEDIFICTS Score Key:  ?70 Need to make dietary changes   40-70 Heart Healthy Diet  ? 40 Therapeutic Level Cholesterol Diet  Flowsheet Row PULMONARY REHAB OTHER RESPIRATORY from 10/04/2020 in [facility]  Picture Your Plate Total Score on Admission 57     Picture Your Plate Scores:  <41 Unhealthy dietary pattern with much room for improvement.  41-50 Dietary pattern unlikely to meet recommendations for good health and room for improvement.  51-60 More healthful dietary pattern, with some room for improvement.   >60 Healthy dietary pattern, although there may be some specific behaviors that could be improved.    Nutrition Goals Re-Evaluation:  Nutrition Goals Re-Evaluation    [place] Name 10/04/20 1433 10/15/20 1403           Goals   Current Weight 172 lb (78 kg) 173 lb 4.5 oz (78.6 kg)      Nutrition Goal -- Pt to identify food quantities necessary to achieve weight loss of 6-24 lb at graduation from pulmonary rehab.             Personal Goal #2 Re-Evaluation  Personal Goal #2 -- Pt to build a healthy plate including vegetables, fruits, whole grains, and low-fat dairy products in a heart healthy meal plan.             Nutrition Goals Discharge (Final Nutrition Goals Re-Evaluation):  Nutrition Goals Re-Evaluation - 10/15/20 1403      Goals   Current Weight 173 lb 4.5 oz (78.6 kg)    Nutrition Goal Pt to identify food quantities necessary to achieve weight loss of 6-24 lb at graduation from pulmonary rehab.      Personal Goal #2 Re-Evaluation   Personal Goal #2 Pt to build a healthy plate including vegetables, fruits, whole grains, and low-fat dairy products in a heart healthy meal plan.            Psychosocial: Target Goals: Acknowledge presence or absence of significant depression and/or stress, maximize coping skills, provide positive support system. Participant is able to verbalize types and ability to use techniques and skills needed for reducing stress and depression.  Initial Review & Psychosocial Screening:  Initial Psych Review & Screening - 09/24/20 1022      Initial Review   Current issues with History of Depression   takes celexa for depression, has been on it since her husband died.     Family Dynamics   Good Support System? Yes   her 2 step daughters     Barriers   Psychosocial barriers to participate in program There are no identifiable barriers or psychosocial needs.      Screening Interventions   Interventions Encouraged to exercise           Quality of Life Scores:  Scores of 19 and below usually indicate a poorer quality of life in these areas.  A difference of  2-3 points is a clinically meaningful difference.  A difference of 2-3 points in the total score of the Quality of Life Index has been associated with significant improvement in overall quality of life, self-image, physical symptoms, and general health in studies assessing change in quality of life.  PHQ-9: Recent Review Flowsheet Data    Depression screen [facility] 2/9 09/24/2020 09/24/2020 09/24/2020   Decreased Interest 0 - 0   Down, Depressed, Hopeless 0 - 0   PHQ - 2 Score 0 - 0   Altered sleeping - 2 -   Tired, decreased energy - 1 -   Change in appetite - 0 -   Feeling bad or failure about yourself  - 0 -   Trouble concentrating - 0 -   Moving slowly or fidgety/restless - 0 -   Suicidal thoughts - 0 -   Difficult doing work/chores - Not difficult at all -     Interpretation of Total Score  Total Score Depression Severity:  1-4 = Minimal depression, 5-9 = Mild depression, 10-14 = Moderate depression, 15-19 = Moderately severe depression, 20-27 = Severe depression   Psychosocial  Evaluation and Intervention:  Psychosocial Evaluation - 09/24/20 1036      Psychosocial Evaluation & Interventions   Interventions Encouraged to exercise with the program and follow exercise prescription    Continue Psychosocial Services  No Follow up required           Psychosocial Re-Evaluation:  Psychosocial Re-Evaluation    [place] Name 10/08/20 1353             Psychosocial Re-Evaluation   Current issues with History of Depression;Current Depression       Comments Depression is stable with anti-depressants  Expected Outcomes For Annalaura's depression to continue to be stable.       Interventions Stress management education;Relaxation education;Encouraged to attend Pulmonary Rehabilitation for the exercise       Continue Psychosocial Services  No Follow up required              Psychosocial Discharge (Final Psychosocial Re-Evaluation):  Psychosocial Re-Evaluation - 10/08/20 1353      Psychosocial Re-Evaluation   Current issues with History of Depression;Current Depression    Comments Depression is stable with anti-depressants    Expected Outcomes For Dashonda's depression to continue to be stable.    Interventions Stress management education;Relaxation education;Encouraged to attend Pulmonary Rehabilitation for the exercise    Continue Psychosocial Services  No Follow up required           Education: Education Goals: Education classes will be provided on a weekly basis, covering required topics. Participant will state understanding/return demonstration of topics presented.  Learning Barriers/Preferences:   Education Topics: Risk Factor Reduction:  -Group instruction that is supported by a PowerPoint presentation. Instructor discusses the definition of a risk factor, different risk factors for pulmonary disease, and how the heart and lungs work together.     Nutrition for Pulmonary Patient:  -Group instruction provided by PowerPoint slides, verbal discussion, and  written materials to support subject matter. The instructor gives an explanation and review of healthy diet recommendations, which includes a discussion on weight management, recommendations for fruit and vegetable consumption, as well as protein, fluid, caffeine, fiber, sodium, sugar, and alcohol. Tips for eating when patients are short of breath are discussed.   Pursed Lip Breathing:  -Group instruction that is supported by demonstration and informational handouts. Instructor discusses the benefits of pursed lip and diaphragmatic breathing and detailed demonstration on how to preform both.     Oxygen Safety:  -Group instruction provided by PowerPoint, verbal discussion, and written material to support subject matter. There is an overview of "What is Oxygen" and "Why do we need it".  Instructor also reviews how to create a safe environment for oxygen use, the importance of using oxygen as prescribed, and the risks of noncompliance. There is a brief discussion on traveling with oxygen and resources the patient may utilize.   Oxygen Equipment:  -Group instruction provided by [facility] Staff utilizing handouts, written materials, and equipment demonstrations.   Signs and Symptoms:  -Group instruction provided by written material and verbal discussion to support subject matter. Warning signs and symptoms of infection, stroke, and heart attack are reviewed and when to call the physician/911 reinforced. Tips for preventing the spread of infection discussed.   Advanced Directives:  -Group instruction provided by verbal instruction and written material to support subject matter. Instructor reviews Advanced Directive laws and proper instruction for filling out document.   Pulmonary Video:  -Group video education that reviews the importance of medication and oxygen compliance, exercise, good nutrition, pulmonary hygiene, and pursed lip and diaphragmatic breathing for the pulmonary  patient.   Exercise for the Pulmonary Patient:  -Group instruction that is supported by a PowerPoint presentation. Instructor discusses benefits of exercise, core components of exercise, frequency, duration, and intensity of an exercise routine, importance of utilizing pulse oximetry during exercise, safety while exercising, and options of places to exercise outside of rehab.     Pulmonary Medications:  -Verbally interactive group education provided by instructor with focus on inhaled medications and proper administration.   Anatomy and Physiology of the Respiratory System and Intimacy:  -  Group instruction provided by PowerPoint, verbal discussion, and written material to support subject matter. Instructor reviews respiratory cycle and anatomical components of the respiratory system and their functions. Instructor also reviews differences in obstructive and restrictive respiratory diseases with examples of each. Intimacy, Sex, and Sexuality differences are reviewed with a discussion on how relationships can change when diagnosed with pulmonary disease. Common sexual concerns are reviewed.   MD DAY -A group question and answer session with a medical doctor that allows participants to ask questions that relate to their pulmonary disease state.   OTHER EDUCATION -Group or individual verbal, written, or video instructions that support the educational goals of the pulmonary rehab program. [place] from 10/04/2020 in [place]  Date 10/04/20  [facility]  Educator handout      Holiday Eating Survival Tips:  -Group instruction provided by PowerPoint slides, verbal discussion, and written materials to support subject matter. The instructor gives patients tips, tricks, and techniques to help them not only survive but enjoy the holidays despite the onslaught of food that accompanies the holidays.   Knowledge Questionnaire  Score:  Knowledge Questionnaire Score - 09/24/20 1002      Knowledge Questionnaire Score   Pre Score 17/18           Core Components/Risk Factors/Patient Goals at Admission:  Personal Goals and Risk Factors at Admission - 09/24/20 1036      Core Components/Risk Factors/Patient Goals on Admission   Improve shortness of breath with ADL's Yes    Intervention Provide education, individualized exercise plan and daily activity instruction to help decrease symptoms of SOB with activities of daily living.    Expected Outcomes Short Term: Improve cardiorespiratory fitness to achieve a reduction of symptoms when performing ADLs;Long Term: Be able to perform more ADLs without symptoms or delay the onset of symptoms           Core Components/Risk Factors/Patient Goals Review:   Goals and Risk Factor Review    Row Name 09/24/20 1036 10/08/20 1354           Core Components/Risk Factors/Patient Goals Review   Personal Goals Review Develop more efficient breathing techniques such as purse lipped breathing and diaphragmatic breathing and practicing self-pacing with activity.;Increase knowledge of respiratory medications and ability to use respiratory devices properly.;Improve shortness of breath with ADL's Develop more efficient breathing techniques such as purse lipped breathing and diaphragmatic breathing and practicing self-pacing with activity.;Increase knowledge of respiratory medications and ability to use respiratory devices properly.;Improve shortness of breath with ADL's      Review -- Haelie has attended 2 exercise sessions and is extremely short of breath.  We are working with her to purse lip breathe and give her lots of support.      Expected Outcomes -- See admission goals.             Core Components/Risk Factors/Patient Goals at Discharge (Final Review):   Goals and Risk Factor Review - 10/08/20 1354      Core Components/Risk Factors/Patient Goals Review   Personal Goals  Review Develop more efficient breathing techniques such as purse lipped breathing and diaphragmatic breathing and practicing self-pacing with activity.;Increase knowledge of respiratory medications and ability to use respiratory devices properly.;Improve shortness of breath with ADL's    Review Aalyah has attended 2 exercise sessions and is extremely short of breath.  We are working with her to purse lip breathe and give her lots of support.  Expected Outcomes See admission goals.           ITP Comments:   Comments:   Geana has completed 3 exercise session in Pulmonary rehab. Pt maintains good attendance and consistent home exercise. Pulmonary rehab staff will continue to monitor and reassess progress toward goals during her participation in Pulmonary Rehab. Cherre Huger, BSN Cardiac and Training and development officer

## 2020-10-18 ENCOUNTER — Other Ambulatory Visit: Payer: Self-pay

## 2020-10-18 ENCOUNTER — Encounter (HOSPITAL_COMMUNITY)
Admission: RE | Admit: 2020-10-18 | Discharge: 2020-10-18 | Disposition: A | Discharge status: Home / Self Care | Payer: PPO | Source: Ambulatory Visit | Attending: Cardiology | Admitting: Cardiology

## 2020-10-18 DIAGNOSIS — J9611 Chronic respiratory failure with hypoxia: Secondary | ICD-10-CM | POA: Diagnosis not present

## 2020-10-18 NOTE — Progress Notes (Signed)
Daily Session Note  Patient Details  Name: Brandy Morales MRN: [id number] Date of Birth: 1945/08/28 Referring Provider:   [name] Pulmonary Rehab Walk Test from 09/24/2020 in [place]  Referring Provider Dr. Camillo Flaming      Encounter Date: 10/18/2020  Check In:  Session Check In - 10/18/20 1444      Check-In   Supervising physician immediately available to respond to emergencies [facility] immediately available    Physician(s) Dr. Maren Beach    Location [facility]    Staff Present Leda Roys, MS, ACSM-CEP, Exercise Physiologist;Other;Tomothy Eddins Ysidro Evert, RN;Jetta Walker BS, ACSM EP-C, Exercise Physiologist    Virtual Visit No    Medication changes reported     No    Fall or balance concerns reported    No    Tobacco Cessation No Change    Warm-up and Cool-down Performed as group-led instruction    Resistance Training Performed Yes    VAD Patient? No    PAD/SET Patient? No      Pain Assessment   Currently in Pain? No/denies    Multiple Pain Sites No           Capillary Blood Glucose: No results found for this or any previous visit (from the past 24 hour(s)).    Social History   Tobacco Use  Smoking Status Former Smoker  . Packs/day: 2.00  . Years: 55.00  . Pack years: 110.00  . Types: Cigarettes  . Quit date: 2016  . Years since quitting: 6.4  Smokeless Tobacco Never Used    Goals Met:  Exercise tolerated well No report of cardiac concerns or symptoms Strength training completed today  Goals Unmet:  Not Applicable  Comments: Service time is from 1315 to 32    Dr. Fransico Him is Medical Director for Cardiac Rehab at [facility].

## 2020-10-23 ENCOUNTER — Encounter (HOSPITAL_COMMUNITY)
Admission: RE | Admit: 2020-10-23 | Discharge: 2020-10-23 | Disposition: A | Discharge status: Home / Self Care | Payer: PPO | Source: Ambulatory Visit | Attending: Cardiology | Admitting: Cardiology

## 2020-10-23 ENCOUNTER — Other Ambulatory Visit: Payer: Self-pay

## 2020-10-23 DIAGNOSIS — I2510 Atherosclerotic heart disease of native coronary artery without angina pectoris: Secondary | ICD-10-CM | POA: Diagnosis not present

## 2020-10-23 DIAGNOSIS — J449 Chronic obstructive pulmonary disease, unspecified: Secondary | ICD-10-CM | POA: Diagnosis not present

## 2020-10-23 DIAGNOSIS — E782 Mixed hyperlipidemia: Secondary | ICD-10-CM | POA: Diagnosis not present

## 2020-10-23 DIAGNOSIS — E7800 Pure hypercholesterolemia, unspecified: Secondary | ICD-10-CM | POA: Diagnosis not present

## 2020-10-23 DIAGNOSIS — C3490 Malignant neoplasm of unspecified part of unspecified bronchus or lung: Secondary | ICD-10-CM | POA: Diagnosis not present

## 2020-10-23 DIAGNOSIS — I10 Essential (primary) hypertension: Secondary | ICD-10-CM | POA: Diagnosis not present

## 2020-10-23 DIAGNOSIS — D4100 Neoplasm of uncertain behavior of unspecified kidney: Secondary | ICD-10-CM | POA: Diagnosis not present

## 2020-10-23 DIAGNOSIS — J9611 Chronic respiratory failure with hypoxia: Secondary | ICD-10-CM

## 2020-10-23 DIAGNOSIS — J441 Chronic obstructive pulmonary disease with (acute) exacerbation: Secondary | ICD-10-CM | POA: Diagnosis not present

## 2020-10-23 NOTE — Progress Notes (Signed)
I have reviewed a Home Exercise Prescription with [name] . Addison is not currently exercising at home. She states her activities of daily living are exercise for her. However, she does do the warm up exercises occasionally at home. The patient was advised to walk and do resistance band exercises 1-2 additional days a week for 15-30 minutes.  Cathaleen and I discussed how to progress their exercise prescription.  The patient stated that their goals were to improve shortness of breath and to be able to do more activities.  The patient stated that they understand the exercise prescription.  We reviewed exercise guidelines, target heart rate during exercise, RPE Scale, weather conditions, use of rescue inhaler, endpoints for exercise, warmup and cool down.  Patient is encouraged to come to me with any questions. I will continue to follow up with the patient to assist them with progression and safety.    Rick Duff MS, ACSM CEP 3:43 PM 10/23/2020

## 2020-10-23 NOTE — Progress Notes (Signed)
Daily Session Note  Patient Details  Name: Brandy Morales MRN: [id number] Date of Birth: 04-25-1946 Referring Provider:   [name] Pulmonary Rehab Walk Test from 09/24/2020 in [place]  Referring Provider Dr. Camillo Flaming      Encounter Date: 10/23/2020  Check In:  Session Check In - 10/23/20 1421      Check-In   Supervising physician immediately available to respond to emergencies [facility] immediately available    Physician(s) Dr. Marthenia Rolling    [facility]    Staff Present Rosebud Poles, RN, Isaac Laud, MS, ACSM-CEP, Exercise Physiologist;Carlette Wilber Oliphant, RN, BSN;Ramon Dredge, RN, MHA    Virtual Visit No    Medication changes reported     No    Fall or balance concerns reported    No    Tobacco Cessation No Change    Warm-up and Cool-down Performed as group-led instruction    Resistance Training Performed Yes    VAD Patient? No    PAD/SET Patient? No      Pain Assessment   Currently in Pain? No/denies    Multiple Pain Sites No           Capillary Blood Glucose: No results found for this or any previous visit (from the past 24 hour(s)).   Exercise Prescription Changes - 10/23/20 1500      Response to Exercise   Blood Pressure (Admit) 104/70    Blood Pressure (Exercise) 100/61    Blood Pressure (Exit) 98/60    Heart Rate (Admit) 93 bpm    Heart Rate (Exercise) 114 bpm    Heart Rate (Exit) 110 bpm    Oxygen Saturation (Admit) 96 %    Oxygen Saturation (Exercise) 90 %    Oxygen Saturation (Exit) 98 %    Rating of Perceived Exertion (Exercise) 13    Perceived Dyspnea (Exercise) 3    Duration Continue with 30 min of aerobic exercise without signs/symptoms of physical distress.    Intensity THRR unchanged      Progression   Progression Continue to progress workloads to maintain intensity without signs/symptoms of physical distress.      Resistance Training   Training Prescription Yes     Weight Red bands    Reps 10-15    Time 10 Minutes      Oxygen   Oxygen Continuous    Liters 3      NuStep   Level 2    SPM 80    Minutes 15    METs 1.5      Track   Laps 10    Minutes 15    METs 2.16      Home Exercise Plan   Plans to continue exercise at Home (comment)   Walking and resistance band exercises   Frequency Add 2 additional days to program exercise sessions.    Initial Home Exercises Provided 10/23/20           Social History   Tobacco Use  Smoking Status Former Smoker  . Packs/day: 2.00  . Years: 55.00  . Pack years: 110.00  . Types: Cigarettes  . Quit date: 2016  . Years since quitting: 6.4  Smokeless Tobacco Never Used    Goals Met:  Proper associated with RPD/PD & O2 Sat Exercise tolerated well No report of cardiac concerns or symptoms Strength training completed today  Goals Unmet:  Not Applicable  Comments: Service time is from 1322 to 1430    Dr.  Fransico Him is Market researcher for Cardiac Rehab at [facility].

## 2020-10-25 ENCOUNTER — Other Ambulatory Visit: Payer: Self-pay

## 2020-10-25 ENCOUNTER — Encounter (HOSPITAL_COMMUNITY)
Admission: RE | Admit: 2020-10-25 | Discharge: 2020-10-25 | Disposition: A | Discharge status: Home / Self Care | Payer: PPO | Source: Ambulatory Visit | Attending: Cardiology | Admitting: Cardiology

## 2020-10-25 DIAGNOSIS — J9611 Chronic respiratory failure with hypoxia: Secondary | ICD-10-CM | POA: Diagnosis not present

## 2020-10-25 NOTE — Progress Notes (Signed)
Daily Session Note  Patient Details  Name: Brandy Morales MRN: [id number] Date of Birth: 1946-02-22 Referring Provider:   [name] Pulmonary Rehab Walk Test from 09/24/2020 in [place]  Referring Provider Dr. Camillo Flaming      Encounter Date: 10/25/2020  Check In:  Session Check In - 10/25/20 1455      Check-In   Supervising physician immediately available to respond to emergencies [facility] immediately available    Physician(s) Dr. Tana Coast    [facility]    Staff Present Leda Roys, MS, ACSM-CEP, Exercise Physiologist;Annedrea Rosezella [place], RN, MHA;David Makemson, MS, ACSM-CEP, CCRP, Exercise Physiologist;Jetta Walker BS, ACSM EP-C, Exercise Physiologist    Virtual Visit No    Medication changes reported     No    Fall or balance concerns reported    No    Tobacco Cessation No Change    Warm-up and Cool-down Performed as group-led instruction    Resistance Training Performed Yes    VAD Patient? No    PAD/SET Patient? No      Pain Assessment   Currently in Pain? No/denies    Multiple Pain Sites No           Capillary Blood Glucose: No results found for this or any previous visit (from the past 24 hour(s)).    Social History   Tobacco Use  Smoking Status Former Smoker  . Packs/day: 2.00  . Years: 55.00  . Pack years: 110.00  . Types: Cigarettes  . Quit date: 2016  . Years since quitting: 6.4  Smokeless Tobacco Never Used    Goals Met:  Proper associated with RPD/PD & O2 Sat Exercise tolerated well No report of cardiac concerns or symptoms Strength training completed today  Goals Unmet:  Not Applicable  Comments: Service time is from 1315 to 1430    Dr. Fransico Him is Medical Director for Cardiac Rehab at [facility].

## 2020-10-30 ENCOUNTER — Encounter (HOSPITAL_COMMUNITY)
Admission: RE | Admit: 2020-10-30 | Discharge: 2020-10-30 | Disposition: A | Discharge status: Home / Self Care | Payer: PPO | Source: Ambulatory Visit | Attending: Cardiology | Admitting: Cardiology

## 2020-10-30 ENCOUNTER — Other Ambulatory Visit: Payer: Self-pay

## 2020-10-30 DIAGNOSIS — J9611 Chronic respiratory failure with hypoxia: Secondary | ICD-10-CM | POA: Diagnosis not present

## 2020-10-30 DIAGNOSIS — J449 Chronic obstructive pulmonary disease, unspecified: Secondary | ICD-10-CM | POA: Diagnosis not present

## 2020-10-30 NOTE — Progress Notes (Signed)
Daily Session Note  Patient Details  Name: Brandy Morales MRN: [id number] Date of Birth: 05-18-1946 Referring Provider:   [name] Pulmonary Rehab Walk Test from 09/24/2020 in [place]  Referring Provider Dr. Camillo Flaming      Encounter Date: 10/30/2020  Check In:  Session Check In - 10/30/20 1354      Check-In   Supervising physician immediately available to respond to emergencies [facility] immediately available    Physician(s) Dr. Tana Coast    [facility]    Staff Present Rodney Langton, RN;Carlette Wilber Oliphant, RN, BSN;Ramon Dredge, RN, MHA;Jessica Hassell Done, MS, ACSM-CEP, Exercise Physiologist;Other    Virtual Visit No    Medication changes reported     No    Fall or balance concerns reported    No    Tobacco Cessation No Change    Warm-up and Cool-down Performed as group-led instruction    Resistance Training Performed Yes    VAD Patient? No    PAD/SET Patient? No      Pain Assessment   Currently in Pain? No/denies    Multiple Pain Sites No           Capillary Blood Glucose: No results found for this or any previous visit (from the past 24 hour(s)).    Social History   Tobacco Use  Smoking Status Former Smoker  . Packs/day: 2.00  . Years: 55.00  . Pack years: 110.00  . Types: Cigarettes  . Quit date: 2016  . Years since quitting: 6.4  Smokeless Tobacco Never Used    Goals Met:  Proper associated with RPD/PD & O2 Sat Exercise tolerated well No report of cardiac concerns or symptoms Strength training completed today  Goals Unmet:  Not Applicable  Comments: Service time is from 1315 to 1430   Dr. Fransico Him is Medical Director for Cardiac Rehab at [facility].

## 2020-11-01 ENCOUNTER — Other Ambulatory Visit: Payer: Self-pay

## 2020-11-01 ENCOUNTER — Ambulatory Visit: Payer: PPO | Attending: Internal Medicine

## 2020-11-01 ENCOUNTER — Other Ambulatory Visit (HOSPITAL_BASED_OUTPATIENT_CLINIC_OR_DEPARTMENT_OTHER): Payer: Self-pay

## 2020-11-01 ENCOUNTER — Encounter (HOSPITAL_COMMUNITY)
Admission: RE | Admit: 2020-11-01 | Discharge: 2020-11-01 | Disposition: A | Discharge status: Home / Self Care | Payer: PPO | Source: Ambulatory Visit | Attending: Cardiology | Admitting: Cardiology

## 2020-11-01 VITALS — Wt 175.3 lb

## 2020-11-01 DIAGNOSIS — J9611 Chronic respiratory failure with hypoxia: Secondary | ICD-10-CM | POA: Diagnosis not present

## 2020-11-01 DIAGNOSIS — Z23 Encounter for immunization: Secondary | ICD-10-CM

## 2020-11-01 MED ORDER — PFIZER-BIONT COVID-19 VAC-TRIS 30 MCG/0.3ML IM SUSP
INTRAMUSCULAR | 0 refills | Status: DC
Start: 2020-11-01 — End: 2021-01-03
  Filled 2020-11-01: qty 0.3, 1d supply, fill #0

## 2020-11-01 NOTE — Progress Notes (Signed)
Daily Session Note  Patient Details  Name: Brandy Morales MRN: [id number] Date of Birth: 11/08/1945 Referring Provider:   Flowsheet Row Pulmonary Rehab Walk Test from 09/24/2020 in [facility]  Referring Provider Dr. Ejaz       Encounter Date: 11/01/2020  Check In:  Session Check In - 11/01/20 1428       Check-In   Supervising physician immediately available to respond to emergencies [facility] immediately available    Physician(s) Dr. Nettey    [facility]    Staff Present Lisa Hughes, RN;Other;Jessica Martin, MS, ACSM-CEP, Exercise Physiologist;Carlette Carlton, RN, BSN    Virtual Visit No    Medication changes reported     No    Fall or balance concerns reported    No    Tobacco Cessation No Change    Warm-up and Cool-down Performed as group-led instruction    Resistance Training Performed No    VAD Patient? No    PAD/SET Patient? No      Pain Assessment   Currently in Pain? No/denies    Multiple Pain Sites No             Capillary Blood Glucose: No results found for this or any previous visit (from the past 24 hour(s)).    Social History   Tobacco Use  Smoking Status Former   Packs/day: 2.00   Years: 55.00   Pack years: 110.00   Types: Cigarettes   Quit date: 2016   Years since quitting: 6.4  Smokeless Tobacco Never    Goals Met:  Exercise tolerated well No report of cardiac concerns or symptoms Strength training completed today  Goals Unmet:  Not Applicable  Comments: Service time is from 1315 to 1420    Dr. Traci Turner is Medical Director for Cardiac Rehab at [facility]. 

## 2020-11-01 NOTE — Progress Notes (Signed)
   Covid-19 Vaccination Clinic  Name:  Neta Upadhyay    MRN: [id number] DOB: 1945-08-13  11/01/2020  Ms. Mort was observed post Covid-19 immunization for 15 minutes without incident. She was provided with Vaccine Information Sheet and instruction to access the V-Safe system.   Ms. Stewart was instructed to call 911 with any severe reactions post vaccine: Difficulty breathing  Swelling of face and throat  A fast heartbeat  A bad rash all over body  Dizziness and weakness   Immunizations Administered     Name Date Dose VIS Date Route   PFIZER Comrnaty(Gray TOP) Covid-19 Vaccine 11/01/2020  3:27 PM 0.3 mL 05/03/2020 Intramuscular   Manufacturer: [place]   Lot: [id number]   [place]: [phone]

## 2020-11-06 ENCOUNTER — Telehealth (HOSPITAL_COMMUNITY): Payer: Self-pay | Admitting: Internal Medicine

## 2020-11-06 ENCOUNTER — Encounter (HOSPITAL_COMMUNITY): Payer: PPO

## 2020-11-08 ENCOUNTER — Encounter (HOSPITAL_COMMUNITY)
Admission: RE | Admit: 2020-11-08 | Discharge: 2020-11-08 | Disposition: A | Discharge status: Home / Self Care | Payer: PPO | Source: Ambulatory Visit | Attending: Cardiology | Admitting: Cardiology

## 2020-11-08 ENCOUNTER — Other Ambulatory Visit: Payer: Self-pay

## 2020-11-08 DIAGNOSIS — J9611 Chronic respiratory failure with hypoxia: Secondary | ICD-10-CM | POA: Diagnosis not present

## 2020-11-08 NOTE — Progress Notes (Signed)
Daily Session Note  Patient Details  Name: Thanya Cegielski MRN: [id number] Date of Birth: 09/25/1945 Referring Provider:   [name] Pulmonary Rehab Walk Test from 09/24/2020 in [place]  Referring Provider Dr. Camillo Flaming       Encounter Date: 11/08/2020  Check In:  Session Check In - 11/08/20 1459       Check-In   Supervising physician immediately available to respond to emergencies [facility] immediately available    Physician(s) Dr. Arbutus Ped    [facility]    Staff Present Rosebud Poles, RN, BSN;Other;Lisa Ysidro Evert, RN    Virtual Visit No    Medication changes reported     No    Fall or balance concerns reported    No    Tobacco Cessation No Change    Warm-up and Cool-down Performed as group-led instruction    Resistance Training Performed Yes    VAD Patient? No    PAD/SET Patient? No      Pain Assessment   Currently in Pain? No/denies    Multiple Pain Sites No             Capillary Blood Glucose: No results found for this or any previous visit (from the past 24 hour(s)).    Social History   Tobacco Use  Smoking Status Former   Packs/day: 2.00   Years: 55.00   Pack years: 110.00   Types: Cigarettes   Quit date: 2016   Years since quitting: 6.4  Smokeless Tobacco Never    Goals Met:  Proper associated with RPD/PD & O2 Sat Independence with exercise equipment Improved SOB with ADL's Exercise tolerated well Strength training completed today  Goals Unmet:  Not Applicable  Comments: Service time is from 1315  to 1430    Dr. Fransico Him is Medical Director for Cardiac Rehab at [facility].

## 2020-11-13 ENCOUNTER — Encounter (HOSPITAL_COMMUNITY)
Admission: RE | Admit: 2020-11-13 | Discharge: 2020-11-13 | Disposition: A | Discharge status: Home / Self Care | Payer: PPO | Source: Ambulatory Visit | Attending: Cardiology | Admitting: Cardiology

## 2020-11-13 ENCOUNTER — Telehealth (HOSPITAL_COMMUNITY): Payer: Self-pay | Admitting: Internal Medicine

## 2020-11-13 DIAGNOSIS — J9611 Chronic respiratory failure with hypoxia: Secondary | ICD-10-CM

## 2020-11-13 NOTE — Progress Notes (Signed)
Pulmonary Individual Treatment Plan  Patient Details  Name: Brandy Morales MRN: [id number] Date of Birth: 10/11/1945 Referring Provider:   Flowsheet Row Pulmonary Rehab Walk Test from 09/24/2020 in [facility]  Referring Provider Dr. Ejaz       Initial Encounter Date:  Flowsheet Row Pulmonary Rehab Walk Test from 09/24/2020 in [facility]  Date 09/24/20       Visit Diagnosis: Chronic respiratory failure with hypoxia (HCC)  Patient's Home Medications on Admission:   Current Outpatient Medications:    acyclovir (ZOVIRAX) 400 MG tablet, Take 400 mg by mouth 3 (three) times daily as needed (for outbreak)., Disp: , Rfl:    Artificial Tear Solution (GENTEAL TEARS OP), Place 1 drop into both eyes daily. , Disp: , Rfl:    BREO ELLIPTA 200-25 MCG/INH AEPB, Inhale 1 puff into the lungs daily., Disp: , Rfl:    cetirizine (ZYRTEC) 10 MG tablet, Take 10 mg by mouth daily., Disp: , Rfl:    Cholecalciferol (VITAMIN D3) 5000 units CAPS, Take 5,000 Units by mouth daily., Disp: , Rfl:    citalopram (CELEXA) 20 MG tablet, Take 20 mg by mouth daily., Disp: , Rfl:    COENZYME Q10 PO, Take 10 mLs by mouth daily. , Disp: , Rfl:    COVID-19 mRNA Vac-TriS, Pfizer, (PFIZER-BIONT COVID-19 VAC-TRIS) SUSP injection, Inject into the muscle., Disp: 0.3 mL, Rfl: 0   ezetimibe (ZETIA) 10 MG tablet, Take 10 mg by mouth daily., Disp: , Rfl:    fluticasone (FLONASE) 50 MCG/ACT nasal spray, Place 1 spray into both nostrils daily. , Disp: , Rfl:    levalbuterol (XOPENEX) 1.25 MG/3ML nebulizer solution, , Disp: , Rfl:    losartan-hydrochlorothiazide (HYZAAR) 100-25 MG tablet, Take 0.5 tablets by mouth daily., Disp: , Rfl:    Multiple Vitamins-Minerals (MULTIVITAMIN PO), Take 6 each by mouth daily. , Disp: , Rfl:    naproxen sodium (ALEVE) 220 MG tablet, Take 220 mg by mouth daily., Disp: , Rfl:    omeprazole (PRILOSEC) 20 MG capsule, Take 20 mg by mouth daily.  , Disp: , Rfl:    OXYGEN, Inhale 2 L into the lungs continuous. , Disp: , Rfl:    tiotropium (SPIRIVA) 18 MCG inhalation capsule, Place 1 capsule (18 mcg total) into inhaler and inhale daily., Disp: 30 capsule, Rfl: 6  Past Medical History: Past Medical History:  Diagnosis Date   Anxiety    Asthma    COPD (chronic obstructive pulmonary disease) (HCC)    GERD (gastroesophageal reflux disease)    History of kidney stones    Hypertension     Tobacco Use: Social History   Tobacco Use  Smoking Status Former   Packs/day: 2.00   Years: 55.00   Pack years: 110.00   Types: Cigarettes   Quit date: 2016   Years since quitting: 6.4  Smokeless Tobacco Never    Labs: Recent Review Flowsheet Data   There is no flowsheet data to display.     Capillary Blood Glucose: Lab Results  Component Value Date   GLUCAP 81 10/12/2019   GLUCAP 91 05/11/2017     Pulmonary Assessment Scores:  Pulmonary Assessment Scores     Row Name 09/24/20 1001 09/24/20 1139       ADL UCSD   ADL Phase Entry Entry    SOB Score total 75 --         CAT Score      CAT Score 24 --           mMRC Score      mMRC Score -- 4           UCSD: Self-administered rating of dyspnea associated with activities of daily living (ADLs) 6-point scale (0 = "not at all" to 5 = "maximal or unable to do because of breathlessness")  Scoring Scores range from 0 to 120.  Minimally important difference is 5 units  CAT: CAT can identify the health impairment of COPD patients and is better correlated with disease progression.  CAT has a scoring range of zero to 40. The CAT score is classified into four groups of low (less than 10), medium (10 - 20), high (21-30) and very high (31-40) based on the impact level of disease on health status. A CAT score over 10 suggests significant symptoms.  A worsening CAT score could be explained by an exacerbation, poor medication adherence, poor inhaler technique, or progression of COPD  or comorbid conditions.  CAT MCID is 2 points  mMRC: mMRC (Modified Medical Research Council) Dyspnea Scale is used to assess the degree of baseline functional disability in patients of respiratory disease due to dyspnea. No minimal important difference is established. A decrease in score of 1 point or greater is considered a positive change.   Pulmonary Function Assessment:  Pulmonary Function Assessment - 09/24/20 1001       Breath   Bilateral Breath Sounds Clear;Decreased    Shortness of Breath Yes;Limiting activity             Exercise Target Goals: Exercise Program Goal: Individual exercise prescription set using results from initial 6 min walk test and THRR while considering  patient's activity barriers and safety.   Exercise Prescription Goal: Initial exercise prescription builds to 30-45 minutes a day of aerobic activity, 2-3 days per week.  Home exercise guidelines will be given to patient during program as part of exercise prescription that the participant will acknowledge.  Activity Barriers & Risk Stratification:  Activity Barriers & Cardiac Risk Stratification - 09/24/20 0945       Activity Barriers & Cardiac Risk Stratification   Activity Barriers Arthritis;Deconditioning;Muscular Weakness;Shortness of Breath             6 Minute Walk:  6 Minute Walk     Row Name 09/24/20 1130         6 Minute Walk   Phase Initial     Distance 899 feet     Walk Time 6 minutes     # of Rest Breaks 0     MPH 1.7     METS 1.97     RPE 13     Perceived Dyspnea  2     VO2 Peak 6.89     Symptoms No     Resting HR 95 bpm     Resting BP 114/79     Resting Oxygen Saturation  94 %     Exercise Oxygen Saturation  during 6 min walk 87 %     Max Ex. HR 113 bpm     Max Ex. BP 134/83     2 Minute Post BP 107/79           Interval HR     1 Minute HR 99     2 Minute HR 99     3 Minute HR 105     4 Minute HR 110     5 Minute HR 106     6 Minute HR 113     2    Minute Post HR 99     Interval Heart Rate? Yes           Interval Oxygen     Interval Oxygen? Yes     Baseline Oxygen Saturation % 94 %     1 Minute Oxygen Saturation % 91 %     1 Minute Liters of Oxygen 2 L     2 Minute Oxygen Saturation % 87 %     2 Minute Liters of Oxygen 2 L  Increased to 3L     3 Minute Oxygen Saturation % 89 %     3 Minute Liters of Oxygen 3 L     4 Minute Oxygen Saturation % 89 %     4 Minute Liters of Oxygen 3 L     5 Minute Oxygen Saturation % 94 %     5 Minute Liters of Oxygen 3 L     6 Minute Oxygen Saturation % 91 %     6 Minute Liters of Oxygen 3 L     2 Minute Post Oxygen Saturation % 94 %     2 Minute Post Liters of Oxygen 3 L             Oxygen Initial Assessment:  Oxygen Initial Assessment - 09/24/20 1000       Home Oxygen   Home Oxygen Device Portable Concentrator;Home Concentrator    Home Exercise Oxygen Prescription Pulsed    Home Resting Oxygen Prescription Pulsed    Liters per minute 2    Compliance with Home Oxygen Use Yes      Initial 6 min Walk   Oxygen Used Continuous    Liters per minute 2   Had to increase to 3L after 2 minutes     Program Oxygen Prescription   Program Oxygen Prescription Continuous    Liters per minute 3    Comments May be able to do seated exercise with 2L, but needs to be on 3L with walking      Intervention   Short Term Goals To learn and exhibit compliance with exercise, home and travel O2 prescription;To learn and understand importance of monitoring SPO2 with pulse oximeter and demonstrate accurate use of the pulse oximeter.;To learn and understand importance of maintaining oxygen saturations>88%;To learn and demonstrate proper pursed lip breathing techniques or other breathing techniques. ;To learn and demonstrate proper use of respiratory medications    Long  Term Goals Exhibits compliance with exercise, home  and travel O2 prescription;Verbalizes importance of monitoring SPO2 with pulse oximeter  and return demonstration;Maintenance of O2 saturations>88%;Exhibits proper breathing techniques, such as pursed lip breathing or other method taught during program session;Compliance with respiratory medication;Demonstrates proper use of MDI's             Oxygen Re-Evaluation:  Oxygen Re-Evaluation     Row Name 10/16/20 0835 11/12/20 1150           Program Oxygen Prescription   Program Oxygen Prescription Continuous Continuous      Liters per minute 3 3             Home Oxygen      Home Oxygen Device Portable Concentrator;Home Concentrator Portable Concentrator;Home Concentrator      Home Exercise Oxygen Prescription Pulsed Pulsed      Liters per minute 2 2      Home Resting Oxygen Prescription Pulsed Pulsed      Liters per minute 2 2      Compliance with  Home Oxygen Use Yes Yes             Goals/Expected Outcomes      Short Term Goals To learn and exhibit compliance with exercise, home and travel O2 prescription;To learn and understand importance of monitoring SPO2 with pulse oximeter and demonstrate accurate use of the pulse oximeter.;To learn and understand importance of maintaining oxygen saturations>88%;To learn and demonstrate proper pursed lip breathing techniques or other breathing techniques. ;To learn and demonstrate proper use of respiratory medications To learn and exhibit compliance with exercise, home and travel O2 prescription;To learn and understand importance of monitoring SPO2 with pulse oximeter and demonstrate accurate use of the pulse oximeter.;To learn and understand importance of maintaining oxygen saturations>88%;To learn and demonstrate proper pursed lip breathing techniques or other breathing techniques. ;To learn and demonstrate proper use of respiratory medications      Long  Term Goals Exhibits compliance with exercise, home  and travel O2 prescription;Verbalizes importance of monitoring SPO2 with pulse oximeter and return demonstration;Maintenance of O2  saturations>88%;Exhibits proper breathing techniques, such as pursed lip breathing or other method taught during program session;Compliance with respiratory medication;Demonstrates proper use of MDI's Exhibits compliance with exercise, home  and travel O2 prescription;Verbalizes importance of monitoring SPO2 with pulse oximeter and return demonstration;Maintenance of O2 saturations>88%;Exhibits proper breathing techniques, such as pursed lip breathing or other method taught during program session;Compliance with respiratory medication;Demonstrates proper use of MDI's      Goals/Expected Outcomes compliance and understanding of oxygen saturation and pursed lip breathing. compliance and understanding of oxygen saturation and pursed lip breathing.              Oxygen Discharge (Final Oxygen Re-Evaluation):  Oxygen Re-Evaluation - 11/12/20 1150       Program Oxygen Prescription   Program Oxygen Prescription Continuous    Liters per minute 3      Home Oxygen   Home Oxygen Device Portable Concentrator;Home Concentrator    Home Exercise Oxygen Prescription Pulsed    Liters per minute 2    Home Resting Oxygen Prescription Pulsed    Liters per minute 2    Compliance with Home Oxygen Use Yes      Goals/Expected Outcomes   Short Term Goals To learn and exhibit compliance with exercise, home and travel O2 prescription;To learn and understand importance of monitoring SPO2 with pulse oximeter and demonstrate accurate use of the pulse oximeter.;To learn and understand importance of maintaining oxygen saturations>88%;To learn and demonstrate proper pursed lip breathing techniques or other breathing techniques. ;To learn and demonstrate proper use of respiratory medications    Long  Term Goals Exhibits compliance with exercise, home  and travel O2 prescription;Verbalizes importance of monitoring SPO2 with pulse oximeter and return demonstration;Maintenance of O2 saturations>88%;Exhibits proper breathing  techniques, such as pursed lip breathing or other method taught during program session;Compliance with respiratory medication;Demonstrates proper use of MDI's    Goals/Expected Outcomes compliance and understanding of oxygen saturation and pursed lip breathing.             Initial Exercise Prescription:  Initial Exercise Prescription - 09/24/20 1100       Date of Initial Exercise RX and Referring Provider   Date 09/24/20    Referring Provider Dr. Ejaz    Expected Discharge Date 11/29/20      Oxygen   Oxygen Continuous    Liters 2-3      NuStep   Level 1    SPM 80    Minutes 15        Track   Minutes 15      Prescription Details   Frequency (times per week) 2    Duration Progress to 30 minutes of continuous aerobic without signs/symptoms of physical distress      Intensity   THRR 40-80% of Max Heartrate 58-117    Ratings of Perceived Exertion 11-13    Perceived Dyspnea 0-4      Progression   Progression Continue to progress workloads to maintain intensity without signs/symptoms of physical distress.      Resistance Training   Training Prescription Yes    Weight Red bands    Reps 10-15             Perform Capillary Blood Glucose checks as needed.  Exercise Prescription Changes:   Exercise Prescription Changes     Row Name 10/09/20 1500 10/23/20 1500 11/06/20 1400         Response to Exercise   Blood Pressure (Admit) 98/64 104/70 104/70     Blood Pressure (Exercise) 100/66 100/61 --     Blood Pressure (Exit) 102/82 98/60 94/76     Heart Rate (Admit) 97 bpm 93 bpm 90 bpm     Heart Rate (Exercise) 106 bpm 114 bpm 107 bpm     Heart Rate (Exit) 113 bpm 110 bpm 100 bpm     Oxygen Saturation (Admit) 95 % 96 % 97 %     Oxygen Saturation (Exercise) 92 % 90 % 92 %     Oxygen Saturation (Exit) 97 % 98 % 98 %     Rating of Perceived Exertion (Exercise) _0 Perceived Dyspnea (Exercise) _1 Duration Progress to 30 minutes of  aerobic without  signs/symptoms of physical distress Continue with 30 min of aerobic exercise without signs/symptoms of physical distress. Continue with 30 min of aerobic exercise without signs/symptoms of physical distress.     Intensity --  40-80% HRR THRR unchanged THRR unchanged           Progression       Progression -- Continue to progress workloads to maintain intensity without signs/symptoms of physical distress. Continue to progress workloads to maintain intensity without signs/symptoms of physical distress.           Resistance Training       Training Prescription Yes Yes Yes     Weight Red bands Red bands Red bands     Reps 10-15 10-15 10-15     Time 10 Minutes 10 Minutes 10 Minutes           Oxygen       Oxygen Continuous Continuous Continuous     Liters _2 NuStep       Level _3 SPM 80 80 80     Minutes _4 METs 1.3 1.5 1.6           Track       Laps _5 Minutes _6 METs -- 2.16 --           Home Exercise Plan       Plans to continue exercise at -- Home (comment)  Walking and resistance band exercises --     Frequency -- Add 2 additional days to program exercise sessions. --  Initial Home Exercises Provided -- 10/23/20 --             Exercise Comments:   Exercise Comments     Row Name 10/02/20 1447 10/23/20 1536         Exercise Comments Pt completed first day of exercise and tolerated well for the most part with no concerns. She is very deconditioned and gets very short of breath. She was able to do 15 minutes on the Nustep and 15 minutes on the track but did have to take several rest breaks due to SOB. She needed cueing for pursed lip breathing several times, especially with walking. She tolerated the resistance training and stretches well with no complaints. Completed home exercise with pt. Patient states her activities of daily living are hard enough for her. Although, she does notice an increase in her energy. We  discussed adding 1-2 days of walking and resistance training in addition to exercise she gets during rehab. Pt was receptive. Will continue to follow up on home exercise prescription.               Exercise Goals and Review:   Exercise Goals     Row Name 09/24/20 1128             Exercise Goals   Increase Physical Activity Yes       Intervention Provide advice, education, support and counseling about physical activity/exercise needs.;Develop an individualized exercise prescription for aerobic and resistive training based on initial evaluation findings, risk stratification, comorbidities and participant's personal goals.       Expected Outcomes Short Term: Attend rehab on a regular basis to increase amount of physical activity.;Long Term: Add in home exercise to make exercise part of routine and to increase amount of physical activity.;Long Term: Exercising regularly at least 3-5 days a week.       Increase Strength and Stamina Yes       Intervention Provide advice, education, support and counseling about physical activity/exercise needs.;Develop an individualized exercise prescription for aerobic and resistive training based on initial evaluation findings, risk stratification, comorbidities and participant's personal goals.       Expected Outcomes Short Term: Increase workloads from initial exercise prescription for resistance, speed, and METs.;Short Term: Perform resistance training exercises routinely during rehab and add in resistance training at home;Long Term: Improve cardiorespiratory fitness, muscular endurance and strength as measured by increased METs and functional capacity (6MWT)       Able to understand and use rate of perceived exertion (RPE) scale Yes       Intervention Provide education and explanation on how to use RPE scale       Expected Outcomes Short Term: Able to use RPE daily in rehab to express subjective intensity level;Long Term:  Able to use RPE to guide intensity  level when exercising independently       Able to understand and use Dyspnea scale Yes       Intervention Provide education and explanation on how to use Dyspnea scale       Expected Outcomes Short Term: Able to use Dyspnea scale daily in rehab to express subjective sense of shortness of breath during exertion;Long Term: Able to use Dyspnea scale to guide intensity level when exercising independently       Knowledge and understanding of Target Heart Rate Range (THRR) Yes       Intervention Provide education and explanation of THRR including how the numbers were predicted and where they are located   for reference       Expected Outcomes Short Term: Able to state/look up THRR;Long Term: Able to use THRR to govern intensity when exercising independently;Short Term: Able to use daily as guideline for intensity in rehab       Understanding of Exercise Prescription Yes       Intervention Provide education, explanation, and written materials on patient's individual exercise prescription       Expected Outcomes Short Term: Able to explain program exercise prescription;Long Term: Able to explain home exercise prescription to exercise independently                Exercise Goals Re-Evaluation :  Exercise Goals Re-Evaluation     Row Name 10/16/20 0832 11/12/20 1128 11/12/20 1147         Exercise Goal Re-Evaluation   Exercise Goals Review Increase Physical Activity;Increase Strength and Stamina;Able to understand and use rate of perceived exertion (RPE) scale;Able to understand and use Dyspnea scale;Knowledge and understanding of Target Heart Rate Range (THRR);Understanding of Exercise Prescription Increase Physical Activity;Increase Strength and Stamina;Able to understand and use rate of perceived exertion (RPE) scale;Able to understand and use Dyspnea scale;Knowledge and understanding of Target Heart Rate Range (THRR);Understanding of Exercise Prescription (P)  Increase Physical Activity;Increase  Strength and Stamina;Able to understand and use rate of perceived exertion (RPE) scale;Able to understand and use Dyspnea scale;Knowledge and understanding of Target Heart Rate Range (THRR);Understanding of Exercise Prescription     Comments Patient has completed 3 exercise sessions and has tolerated well so far. She is very deconditioned but has been working hard so far. She is able to do the Nustep for 15 minutes with no rest breaks and is exercising at 1.3 METS. She also walks the track for 15 minutes but does have to take a few rest breaks during that time due to fatigue and shortness of breath. She is exercising at 2 METS walking the track. Will continue to monitor and progress as she is able. -- Brandy Morales has completed 10 exercise sessions and has been consistent with workload and MET level increases. She has done well throughout the program so far and is not having to take as many rest breaks on the track. She is exercising at 1.6 METS on the Nustep and 2.05 METS on the track. Will continue to monitor and progress as she is able.     Expected Outcomes Through exercise at rehab and home, the patient will decrease shortness of breath with daily activities and feel confident in carrying out an exercise regimn at home. Through exercise at rehab and home, the patient will decrease shortness of breath with daily activities and feel confident in carrying out an exercise regimn at home. (P)  Through exercise at rehab and home, the patient will decrease shortness of breath with daily activities and feel confident in carrying out an exercise regimn at home.              Discharge Exercise Prescription (Final Exercise Prescription Changes):  Exercise Prescription Changes - 11/06/20 1400       Response to Exercise   Blood Pressure (Admit) 104/70    Blood Pressure (Exit) 94/76    Heart Rate (Admit) 90 bpm    Heart Rate (Exercise) 107 bpm    Heart Rate (Exit) 100 bpm    Oxygen Saturation (Admit) 97 %     Oxygen Saturation (Exercise) 92 %    Oxygen Saturation (Exit) 98 %    Rating of Perceived Exertion (Exercise)   14    Perceived Dyspnea (Exercise) 3    Duration Continue with 30 min of aerobic exercise without signs/symptoms of physical distress.    Intensity THRR unchanged      Progression   Progression Continue to progress workloads to maintain intensity without signs/symptoms of physical distress.      Resistance Training   Training Prescription Yes    Weight Red bands    Reps 10-15    Time 10 Minutes      Oxygen   Oxygen Continuous    Liters 3      NuStep   Level 2    SPM 80    Minutes 15    METs 1.6      Track   Laps 11    Minutes 15             Nutrition:  Target Goals: Understanding of nutrition guidelines, daily intake of sodium <1542m, cholesterol <2077m calories 30% from fat and 7% or less from saturated fats, daily to have 5 or more servings of fruits and vegetables.  Biometrics:  Pre Biometrics - 09/24/20 0945       Pre Biometrics   Height 5' 4" (1.626 m)    Weight 78.2 kg    BMI (Calculated) 29.58    Grip Strength 15 kg              Nutrition Therapy Plan and Nutrition Goals:  Nutrition Therapy & Goals - 10/04/20 1433       Nutrition Therapy   Diet Generally Healthful      Personal Nutrition Goals   Nutrition Goal Pt to identify food quantities necessary to achieve weight loss of 6-24 lb at graduation from pulmonary rehab.    Personal Goal #2 Pt to build a healthy plate including vegetables, fruits, whole grains, and low-fat dairy products in a heart healthy meal plan.      Intervention Plan   Intervention Prescribe, educate and counsel regarding individualized specific dietary modifications aiming towards targeted core components such as weight, hypertension, lipid management, diabetes, heart failure and other comorbidities.    Expected Outcomes Long Term Goal: Adherence to prescribed nutrition plan.;Short Term Goal: Understand basic  principles of dietary content, such as calories, fat, sodium, cholesterol and nutrients.             Nutrition Assessments:  MEDIFICTS Score Key: ?70 Need to make dietary changes  40-70 Heart Healthy Diet ? 40 Therapeutic Level Cholesterol Diet  Flowsheet Row [facility]ULMONARY [facility]HAB OTHER [facility]AT[facility] [place] [facility] [place]      Picture Your Plate Scores: <4<08nhealthy dietary pattern with much room for improvement. 41-50 Dietary pattern unlikely to meet recommendations for good health and room for improvement. 51-60 More healthful dietary pattern, with some room for improvement.  >60 Healthy dietary pattern, although there may be some specific behaviors that could be improved.    Nutrition Goals Re-Evaluation:  Nutrition Goals Re-Evaluation     [place] 10/04/20 1433 10/15/20 1403 11/13/20 0918         Goals   Current Weight 172 lb (78 kg) 173 lb 4.5 oz (78.6 kg) 175 lb 4.3 oz (79.5 kg)     Nutrition Goal -- Pt to identify food quantities necessary to achieve weight loss of 6-24 lb at graduation from pulmonary rehab. Pt to identify food quantities necessary to achieve weight loss of 6-24 lb at graduation from pulmonary  rehab.           Personal Goal #2 Re-Evaluation       Personal Goal #2 -- Pt to build a healthy plate including vegetables, fruits, whole grains, and low-fat dairy products in a heart healthy meal plan. Pt to build a healthy plate including vegetables, fruits, whole grains, and low-fat dairy products in a heart healthy meal plan.             Nutrition Goals Discharge (Final Nutrition Goals Re-Evaluation):  Nutrition Goals Re-Evaluation - 11/13/20 0918       Goals   Current Weight 175 lb 4.3 oz (79.5 kg)    Nutrition Goal Pt to identify food quantities necessary to achieve weight loss of 6-24 lb at graduation from pulmonary rehab.      Personal Goal #2 Re-Evaluation    Personal Goal #2 Pt to build a healthy plate including vegetables, fruits, whole grains, and low-fat dairy products in a heart healthy meal plan.             Psychosocial: Target Goals: Acknowledge presence or absence of significant depression and/or stress, maximize coping skills, provide positive support system. Participant is able to verbalize types and ability to use techniques and skills needed for reducing stress and depression.  Initial Review & Psychosocial Screening:  Initial Psych Review & Screening - 09/24/20 1022       Initial Review   Current issues with History of Depression   takes celexa for depression, has been on it since her husband died.     Family Dynamics   Good Support System? Yes   her 2 step daughters     Barriers   Psychosocial barriers to participate in program There are no identifiable barriers or psychosocial needs.      Screening Interventions   Interventions Encouraged to exercise             Quality of Life Scores:  Scores of 19 and below usually indicate a poorer quality of life in these areas.  A difference of  2-3 points is a clinically meaningful difference.  A difference of 2-3 points in the total score of the Quality of Life Index has been associated with significant improvement in overall quality of life, self-image, physical symptoms, and general health in studies assessing change in quality of life.  PHQ-9: Recent Review Flowsheet Data     Depression screen PHQ 2/9 09/24/2020 09/24/2020 09/24/2020   Decreased Interest 0 - 0   Down, Depressed, Hopeless 0 - 0   PHQ - 2 Score 0 - 0   Altered sleeping - 2 -   Tired, decreased energy - 1 -   Change in appetite - 0 -   Feeling bad or failure about yourself  - 0 -   Trouble concentrating - 0 -   Moving slowly or fidgety/restless - 0 -   Suicidal thoughts - 0 -   Difficult doing work/chores - Not difficult at all -      Interpretation of Total Score  Total Score Depression Severity:   1-4 = Minimal depression, 5-9 = Mild depression, 10-14 = Moderate depression, 15-19 = Moderately severe depression, 20-27 = Severe depression   Psychosocial Evaluation and Intervention:  Psychosocial Evaluation - 09/24/20 1036       Psychosocial Evaluation & Interventions   Interventions Encouraged to exercise with the program and follow exercise prescription    Continue Psychosocial Services  No Follow up required               Psychosocial Re-Evaluation:  Psychosocial Re-Evaluation     Row Name 10/08/20 1353 11/12/20 1012           Psychosocial Re-Evaluation   Current issues with History of Depression;Current Depression History of Depression      Comments Depression is stable with anti-depressants Takes an anti-depressant and it keeps her mood positive, she has been on it many years since her husband died. No psychosocial concerns identified at this time.      Expected Outcomes For Brandy Morales's depression to continue to be stable. For Brandy Morales to continue to be free of psychosocial concerns while participating in pulmonary rehab.      Interventions Stress management education;Relaxation education;Encouraged to attend Pulmonary Rehabilitation for the exercise Encouraged to attend Pulmonary Rehabilitation for the exercise      Continue Psychosocial Services  No Follow up required No Follow up required               Psychosocial Discharge (Final Psychosocial Re-Evaluation):  Psychosocial Re-Evaluation - 11/12/20 1012       Psychosocial Re-Evaluation   Current issues with History of Depression    Comments Takes an anti-depressant and it keeps her mood positive, she has been on it many years since her husband died. No psychosocial concerns identified at this time.    Expected Outcomes For Brandy Morales to continue to be free of psychosocial concerns while participating in pulmonary rehab.    Interventions Encouraged to attend Pulmonary Rehabilitation for the exercise    Continue  Psychosocial Services  No Follow up required             Education: Education Goals: Education classes will be provided on a weekly basis, covering required topics. Participant will state understanding/return demonstration of topics presented.  Learning Barriers/Preferences:   Education Topics: Risk Factor Reduction:  -Group instruction that is supported by a PowerPoint presentation. Instructor discusses the definition of a risk factor, different risk factors for pulmonary disease, and how the heart and lungs work together.     Nutrition for Pulmonary Patient:  -Group instruction provided by PowerPoint slides, verbal discussion, and written materials to support subject matter. The instructor gives an explanation and review of healthy diet recommendations, which includes a discussion on weight management, recommendations for fruit and vegetable consumption, as well as protein, fluid, caffeine, fiber, sodium, sugar, and alcohol. Tips for eating when patients are short of breath are discussed. Flowsheet Row PULMONARY REHAB OTHER RESPIRATORY from 11/08/2020 in [facility]  Date 11/08/20  Educator handout       Pursed Lip Breathing:  -Group instruction that is supported by demonstration and informational handouts. Instructor discusses the benefits of pursed lip and diaphragmatic breathing and detailed demonstration on how to preform both.     Oxygen Safety:  -Group instruction provided by PowerPoint, verbal discussion, and written material to support subject matter. There is an overview of "What is Oxygen" and "Why do we need it".  Instructor also reviews how to create a safe environment for oxygen use, the importance of using oxygen as prescribed, and the risks of noncompliance. There is a brief discussion on traveling with oxygen and resources the patient may utilize. Flowsheet Row PULMONARY REHAB OTHER RESPIRATORY from 11/08/2020 in [facility]  Date 11/01/20  Educator Handout       Oxygen Equipment:  -Group instruction provided by Home Health Staff utilizing handouts, written materials, and equipment demonstrations.   Signs and Symptoms:  -Group instruction provided   by written material and verbal discussion to support subject matter. Warning signs and symptoms of infection, stroke, and heart attack are reviewed and when to call the physician/911 reinforced. Tips for preventing the spread of infection discussed.   Advanced Directives:  -Group instruction provided by verbal instruction and written material to support subject matter. Instructor reviews Advanced Directive laws and proper instruction for filling out document.   Pulmonary Video:  -Group video education that reviews the importance of medication and oxygen compliance, exercise, good nutrition, pulmonary hygiene, and pursed lip and diaphragmatic breathing for the pulmonary patient.   Exercise for the Pulmonary Patient:  -Group instruction that is supported by a PowerPoint presentation. Instructor discusses benefits of exercise, core components of exercise, frequency, duration, and intensity of an exercise routine, importance of utilizing pulse oximetry during exercise, safety while exercising, and options of places to exercise outside of rehab.     Pulmonary Medications:  -Verbally interactive group education provided by instructor with focus on inhaled medications and proper administration.   Anatomy and Physiology of the Respiratory System and Intimacy:  -Group instruction provided by PowerPoint, verbal discussion, and written material to support subject matter. Instructor reviews respiratory cycle and anatomical components of the respiratory system and their functions. Instructor also reviews differences in obstructive and restrictive respiratory diseases with examples of each. Intimacy, Sex, and Sexuality differences are reviewed with a  discussion on how relationships can change when diagnosed with pulmonary disease. Common sexual concerns are reviewed. Flowsheet Row PULMONARY REHAB OTHER RESPIRATORY from 11/08/2020 in [place]  Date 10/25/20  Educator Jess-H/O  Instruction Review Code 1- Verbalizes Understanding       MD DAY -A group question and answer session with a medical doctor that allows participants to ask questions that relate to their pulmonary disease state.   OTHER EDUCATION -Group or individual verbal, written, or video instructions that support the educational goals of the pulmonary rehab program. [place] from 11/08/2020 in [place]  Date 10/04/20  [facility] [place]  Educator handout       Holiday Eating Survival Tips:  -Group instruction provided by PowerPoint slides, verbal discussion, and written materials to support subject matter. The instructor gives patients tips, tricks, and techniques to help them not only survive but enjoy the holidays despite the onslaught of food that accompanies the holidays.   Knowledge Questionnaire Score:  Knowledge Questionnaire Score - 09/24/20 1002       Knowledge Questionnaire Score   Pre Score 17/18             Core Components/Risk Factors/Patient Goals at Admission:  Personal Goals and Risk Factors at Admission - 09/24/20 1036       Core Components/Risk Factors/Patient Goals on Admission   Improve shortness of breath with ADL's Yes    Intervention Provide education, individualized exercise plan and daily activity instruction to help decrease symptoms of SOB with activities of daily living.    Expected Outcomes Short Term: Improve cardiorespiratory fitness to achieve a reduction of symptoms when performing ADLs;Long Term: Be able to perform more ADLs without symptoms or delay the onset of symptoms             Core Components/Risk  Factors/Patient Goals Review:   Goals and Risk Factor Review     Row Name 09/24/20 1036 10/08/20 1354 11/12/20 1015         Core Components/Risk Factors/Patient Goals Review   Personal Goals  Review Develop more efficient breathing techniques such as purse lipped breathing and diaphragmatic breathing and practicing self-pacing with activity.;Increase knowledge of respiratory medications and ability to use respiratory devices properly.;Improve shortness of breath with ADL's Develop more efficient breathing techniques such as purse lipped breathing and diaphragmatic breathing and practicing self-pacing with activity.;Increase knowledge of respiratory medications and ability to use respiratory devices properly.;Improve shortness of breath with ADL's Develop more efficient breathing techniques such as purse lipped breathing and diaphragmatic breathing and practicing self-pacing with activity.;Increase knowledge of respiratory medications and ability to use respiratory devices properly.;Improve shortness of breath with ADL's     Review -- Hatsumi has attended 2 exercise sessions and is extremely short of breath.  We are working with her to purse lip breathe and give her lots of support. Swayzie has made great progress while in pulmonary rehab.  She was house bound for 2 years during the covid pandemic and had become very deconditioned.  She reports having more strength and stamina when performing tasks at home.  She is level 3 on the nustep and walking 9 laps on the track in 15 minutes.     Expected Outcomes -- See admission goals. See admission goals.              Core Components/Risk Factors/Patient Goals at Discharge (Final Review):   Goals and Risk Factor Review - 11/12/20 1015       Core Components/Risk Factors/Patient Goals Review   Personal Goals Review Develop more efficient breathing techniques such as purse lipped breathing and diaphragmatic breathing and practicing self-pacing with  activity.;Increase knowledge of respiratory medications and ability to use respiratory devices properly.;Improve shortness of breath with ADL's    Review Iyana has made great progress while in pulmonary rehab.  She was house bound for 2 years during the covid pandemic and had become very deconditioned.  She reports having more strength and stamina when performing tasks at home.  She is level 3 on the nustep and walking 9 laps on the track in 15 minutes.    Expected Outcomes See admission goals.             ITP Comments:   Comments: ITP REVIEW Pt is making expected progress toward pulmonary rehab goals after completing 10 sessions. Recommend continued exercise, life style modification, education, and utilization of breathing techniques to increase stamina and strength and decrease shortness of breath with exertion.

## 2020-11-15 ENCOUNTER — Telehealth (HOSPITAL_COMMUNITY): Payer: Self-pay | Admitting: Internal Medicine

## 2020-11-15 ENCOUNTER — Encounter (HOSPITAL_COMMUNITY): Payer: PPO

## 2020-11-20 ENCOUNTER — Other Ambulatory Visit: Payer: Self-pay

## 2020-11-20 ENCOUNTER — Encounter (HOSPITAL_COMMUNITY)
Admission: RE | Admit: 2020-11-20 | Discharge: 2020-11-20 | Disposition: A | Discharge status: Home / Self Care | Payer: PPO | Source: Ambulatory Visit | Attending: Cardiology | Admitting: Cardiology

## 2020-11-20 VITALS — Wt 173.1 lb

## 2020-11-20 DIAGNOSIS — J9611 Chronic respiratory failure with hypoxia: Secondary | ICD-10-CM | POA: Diagnosis not present

## 2020-11-20 NOTE — Progress Notes (Signed)
Daily Session Note  Patient Details  Name: Brandy Morales MRN: [id number] Date of Birth: 07-Aug-1945 Referring Provider:   [name] Pulmonary Rehab Walk Test from 09/24/2020 in [place]  Referring Provider Dr. Camillo Flaming       Encounter Date: 11/20/2020  Check In:  Session Check In - 11/20/20 1419       Check-In   Supervising physician immediately available to respond to emergencies [facility] immediately available    Physician(s) Dr. Lonny Prude    Location [facility]    Staff Present Rodney Langton, RN;Olinty Celesta Aver, MS, ACSM CEP, Exercise Physiologist;Other;Jessica Hassell Done, MS, ACSM-CEP, Exercise Physiologist    Virtual Visit No    Medication changes reported     No    Fall or balance concerns reported    No    Tobacco Cessation No Change    Warm-up and Cool-down Performed as group-led instruction    Resistance Training Performed Yes    VAD Patient? No    PAD/SET Patient? No      Pain Assessment   Currently in Pain? No/denies    Multiple Pain Sites No             Capillary Blood Glucose: No results found for this or any previous visit (from the past 24 hour(s)).   Exercise Prescription Changes - 11/20/20 1400       Response to Exercise   Blood Pressure (Admit) 110/70    Blood Pressure (Exit) 100/63    Heart Rate (Admit) 96 bpm    Heart Rate (Exercise) 114 bpm    Heart Rate (Exit) 106 bpm    Oxygen Saturation (Admit) 95 %    Oxygen Saturation (Exercise) 93 %    Oxygen Saturation (Exit) 96 %    Rating of Perceived Exertion (Exercise) 13    Perceived Dyspnea (Exercise) 2    Duration Continue with 30 min of aerobic exercise without signs/symptoms of physical distress.    Intensity THRR unchanged      Progression   Progression Continue to progress workloads to maintain intensity without signs/symptoms of physical distress.      Resistance Training   Training Prescription Yes    Weight Red Bands    Reps  10-15    Time 10 Minutes      Oxygen   Oxygen Continuous    Liters 3      NuStep   Level 3    SPM 80    Minutes 15    METs 1.6      Track   Laps 5    Minutes 15             Social History   Tobacco Use  Smoking Status Former   Packs/day: 2.00   Years: 55.00   Pack years: 110.00   Types: Cigarettes   Quit date: 2016   Years since quitting: 6.4  Smokeless Tobacco Never    Goals Met:  Exercise tolerated well No report of cardiac concerns or symptoms Strength training completed today  Goals Unmet:  Not Applicable.   Comments: Service time is from 1315 to 28    Dr. Fransico Him is Medical Director for Cardiac Rehab at [facility].

## 2020-11-22 ENCOUNTER — Encounter (HOSPITAL_COMMUNITY)
Admission: RE | Admit: 2020-11-22 | Discharge: 2020-11-22 | Disposition: A | Discharge status: Home / Self Care | Payer: PPO | Source: Ambulatory Visit | Attending: Cardiology | Admitting: Cardiology

## 2020-11-22 ENCOUNTER — Other Ambulatory Visit: Payer: Self-pay

## 2020-11-22 DIAGNOSIS — J9611 Chronic respiratory failure with hypoxia: Secondary | ICD-10-CM

## 2020-11-22 NOTE — Progress Notes (Signed)
Daily Session Note  Patient Details  Name: Brandy Morales MRN: [id number] Date of Birth: [birth year] Referring Provider:   [name] Pulmonary Rehab Walk Test from 09/24/2020 in [place]  Referring Provider Dr. Camillo Flaming       Encounter Date: 11/22/2020  Check In:  Session Check In - 11/22/20 1447       Check-In   Supervising physician immediately available to respond to emergencies [facility] immediately available    Physician(s) Dr. Alfredia Ferguson    [facility]    Staff Present Rosebud Poles, RN, Quentin Ore, MS, ACSM-CEP, Exercise Physiologist;Lisa Ysidro Evert, RN    Virtual Visit No    Medication changes reported     No    Fall or balance concerns reported    No    Tobacco Cessation No Change    Warm-up and Cool-down Performed as group-led instruction    Resistance Training Performed Yes    VAD Patient? No    PAD/SET Patient? No      Pain Assessment   Currently in Pain? No/denies    Multiple Pain Sites No             Capillary Blood Glucose: No results found for this or any previous visit (from the past 24 hour(s)).    Social History   Tobacco Use  Smoking Status Former   Packs/day: 2.00   Years: 55.00   Pack years: 110.00   Types: Cigarettes   Quit date: 2016   Years since quitting: 6.4  Smokeless Tobacco Never    Goals Met:  Proper associated with RPD/PD & O2 Sat Exercise tolerated well No report of cardiac concerns or symptoms Strength training completed today  Goals Unmet:  Not Applicable  Comments: Service time is from 1315 to 8    Dr. Fransico Him is Medical Director for Cardiac Rehab at [facility].

## 2020-11-27 ENCOUNTER — Other Ambulatory Visit: Payer: Self-pay

## 2020-11-27 ENCOUNTER — Encounter (HOSPITAL_COMMUNITY)
Admission: RE | Admit: 2020-11-27 | Discharge: 2020-11-27 | Disposition: A | Discharge status: Home / Self Care | Payer: PPO | Source: Ambulatory Visit | Attending: Cardiology | Admitting: Cardiology

## 2020-11-27 ENCOUNTER — Encounter: Payer: Self-pay | Admitting: Radiology

## 2020-11-27 DIAGNOSIS — J9611 Chronic respiratory failure with hypoxia: Secondary | ICD-10-CM | POA: Diagnosis not present

## 2020-11-27 NOTE — Progress Notes (Signed)
Nutrition Note Follow Up  Spoke with pt. She wants to lose weight. Currently she has maintained her weight during pulmonary rehab. She is motivated to lose weight. She is currently sedentary at home. She exercises at pulmonary rehab 2 days per week. We discussed accountability with a friend to exercise at home.  Reviewed diet recall.   Snack: 2 gingersnaps and black coffee Breakfast peanut butter toast with banana/berries Lunch: 1/2 TRUTH bar, 2 handfuls trader joes popcorn Snack: peach milkshake after exercise days (chicfila)0 Dinner: [employer] casserole Snack: Haag[employer] Dazs ice cream bar (270 cals) every other day Fluids: water  Reviewed weight loss tips.   Nutrition Diagnosis  Food-and nutrition-related knowledge deficit related to lack of exposure to information as related to diagnosis of: ? HTN and desire for sustainable weight loss    Nutrition Intervention  Pt's individual nutrition plan reviewed with pt. Plate method, reduce added sugars, exercise        Continue client-centered nutrition education by RD, as part of interdisciplinary care.   Goal(s) Pt to identify food quantities necessary to achieve weight loss of 6-24 lb at graduation from pulmonary rehab. Pt to build a healthy plate including vegetables, fruits, whole grains, and low-fat dairy products in a heart healthy meal plan.  Exercise at home 2 days per week    Plan:  Will provide client-centered nutrition education as part of interdisciplinary care Monitor and evaluate progress toward nutrition goal with team.     Michaele Offer, MS, RDN, LDN, CDCES

## 2020-11-27 NOTE — Progress Notes (Signed)
Daily Session Note  Patient Details  Name: Brandy Morales MRN: [id number] Date of Birth: 1946-02-16 Referring Provider:   [name] Pulmonary Rehab Walk Test from 09/24/2020 in [place]  Referring Provider Dr. Camillo Flaming       Encounter Date: 11/27/2020  Check In:  Session Check In - 11/27/20 1449       Check-In   Supervising physician immediately available to respond to emergencies [facility] immediately available    Physician(s) Dr. Alfredia Ferguson    [facility]    Staff Present Rosebud Poles, RN, Isaac Laud, MS, ACSM-CEP, Exercise Physiologist;Lisa Clarisa Schools, MS, ACSM-CEP, Exercise Physiologist    Virtual Visit No    Medication changes reported     No    Fall or balance concerns reported    No    Tobacco Cessation No Change    Warm-up and Cool-down Performed as group-led instruction    Resistance Training Performed Yes    VAD Patient? No    PAD/SET Patient? No      Pain Assessment   Currently in Pain? No/denies    Multiple Pain Sites No             Capillary Blood Glucose: No results found for this or any previous visit (from the past 24 hour(s)).    Social History   Tobacco Use  Smoking Status Former   Packs/day: 2.00   Years: 55.00   Pack years: 110.00   Types: Cigarettes   Quit date: 2016   Years since quitting: 6.5  Smokeless Tobacco Never    Goals Met:  Proper associated with RPD/PD & O2 Sat Exercise tolerated well No report of cardiac concerns or symptoms Strength training completed today  Goals Unmet:  Not Applicable  Comments: Service time is from 1315 to 31    Dr. Fransico Him is Medical Director for Cardiac Rehab at [facility].

## 2020-11-29 ENCOUNTER — Other Ambulatory Visit: Payer: Self-pay

## 2020-11-29 ENCOUNTER — Encounter (HOSPITAL_COMMUNITY)
Admission: RE | Admit: 2020-11-29 | Discharge: 2020-11-29 | Disposition: A | Discharge status: Home / Self Care | Payer: PPO | Source: Ambulatory Visit | Attending: Cardiology | Admitting: Cardiology

## 2020-11-29 DIAGNOSIS — J9611 Chronic respiratory failure with hypoxia: Secondary | ICD-10-CM

## 2020-11-29 DIAGNOSIS — J449 Chronic obstructive pulmonary disease, unspecified: Secondary | ICD-10-CM | POA: Diagnosis not present

## 2020-11-29 NOTE — Progress Notes (Signed)
Daily Session Note  Patient Details  Name: Oria Klimas MRN: [id number] Date of Birth: 01/25/1946 Referring Provider:   [name] Pulmonary Rehab Walk Test from 09/24/2020 in [place]  Referring Provider Dr. Camillo Flaming       Encounter Date: 11/29/2020  Check In:  Session Check In - 11/29/20 1506       Check-In   Supervising physician immediately available to respond to emergencies [facility] immediately available    Physician(s) Dr. Cruzita Lederer    [facility]    Staff Present Rosebud Poles, RN, Isaac Laud, MS, ACSM-CEP, Exercise Physiologist;Annedrea Rosezella [place], RN, Ramonita Lab, RN    Virtual Visit No    Medication changes reported     No    Fall or balance concerns reported    No    Tobacco Cessation No Change    Warm-up and Cool-down Performed as group-led instruction    Resistance Training Performed Yes    VAD Patient? No    PAD/SET Patient? No      Pain Assessment   Currently in Pain? No/denies    Multiple Pain Sites No             Capillary Blood Glucose: No results found for this or any previous visit (from the past 24 hour(s)).    Social History   Tobacco Use  Smoking Status Former   Packs/day: 2.00   Years: 55.00   Pack years: 110.00   Types: Cigarettes   Quit date: 2016   Years since quitting: 6.5  Smokeless Tobacco Never    Goals Met:  Proper associated with RPD/PD & O2 Sat Improved SOB with ADL's Exercise tolerated well No report of cardiac concerns or symptoms Patient completed pulmonary undergrad program  Goals Unmet:  Not Applicable  Comments: Service time is from 1315 to 1355 Patient completed pulmonary rehab undergrad program today and made improvements.     Dr. Fransico Him is Medical Director for Cardiac Rehab at [facility].

## 2020-11-30 ENCOUNTER — Ambulatory Visit (HOSPITAL_COMMUNITY)
Admission: RE | Admit: 2020-11-30 | Discharge: 2020-11-30 | Disposition: A | Discharge status: Home / Self Care | Payer: PPO | Source: Ambulatory Visit | Attending: Radiation Oncology | Admitting: Radiation Oncology

## 2020-11-30 DIAGNOSIS — R911 Solitary pulmonary nodule: Secondary | ICD-10-CM | POA: Diagnosis not present

## 2020-11-30 DIAGNOSIS — J439 Emphysema, unspecified: Secondary | ICD-10-CM | POA: Diagnosis not present

## 2020-11-30 DIAGNOSIS — R918 Other nonspecific abnormal finding of lung field: Secondary | ICD-10-CM | POA: Diagnosis not present

## 2020-11-30 DIAGNOSIS — I700 Atherosclerosis of aorta: Secondary | ICD-10-CM | POA: Diagnosis not present

## 2020-11-30 DIAGNOSIS — C3490 Malignant neoplasm of unspecified part of unspecified bronchus or lung: Secondary | ICD-10-CM | POA: Diagnosis not present

## 2020-12-03 ENCOUNTER — Ambulatory Visit
Admission: RE | Admit: 2020-12-03 | Discharge: 2020-12-03 | Disposition: A | Discharge status: Home / Self Care | Payer: PPO | Source: Ambulatory Visit | Attending: Radiation Oncology | Admitting: Radiation Oncology

## 2020-12-04 ENCOUNTER — Telehealth: Payer: Self-pay | Admitting: *Deleted

## 2020-12-04 NOTE — Progress Notes (Signed)
Radiation Oncology         [phone] ________________________________  Name: Brandy Morales MRN: [id number]  Date: 12/06/2020  DOB: 1945/12/04  Follow-Up Visit Note  CC: [name], MD  Brand Males, MD    ICD-10-CM   1. Pulmonary nodules  R91.8 CT CHEST WO CONTRAST      Diagnosis: PET avid right upper lobe spiculated pulmonary nodule concerning for primary bronchogenic carcinoma   Interval Since Last Radiation:  11 months and 2 days  Radiation Treatment Dates: 12/29/2019 through 01/05/2020 Site Technique Total Dose (Gy) Dose per Fx (Gy) Completed Fx Beam Energies  Lung, Right: Lung_Rt SBRT 54/54 18 3/3 6XFFF   Narrative:  The patient returns today for routine follow-up. The patient was last seen on 06/04/20.   Recent imaging includes a chest CT taken on 11/30/20 which showed the treated posterior nodule in the right upper lobe to have continued to decrease in size; showing a consistent response to radiation therapy. Also seen was an enlarging indeterminate peribronchovascular nodule (7 mm) in the left lower lobe. Otherwise, no other significant changes or evidence of metastatic disease were seen.      Of note: the patient presented to the [facility] ED on 07/06/20 with left flank pain with associated nausea starting in the afternoon. She was found to have kidney stones, and has a known history of kidney stones.   The patient has been also attending pulmonary rehab since 10/04/20, her last appointment was on 11/29/20.  She reports improvement in her stamina and activity level since completing the pulmonary rehab course.  She wishes to do it again in the future.  She continues on 2 L of oxygen continuous.  She denies any pain within the chest area significant cough or hemoptysis.  Allergies:  is allergic to statins and latex.  Meds: Current Outpatient Medications  Medication Sig Dispense Refill   acyclovir (ZOVIRAX) 400 MG tablet Take 400 mg by mouth 3 (three) times  daily as needed (for outbreak).     Artificial Tear Solution (GENTEAL TEARS OP) Place 1 drop into both eyes daily.      BREO ELLIPTA 200-25 MCG/INH AEPB Inhale 1 puff into the lungs daily.     cetirizine (ZYRTEC) 10 MG tablet Take 10 mg by mouth daily.     Cholecalciferol (VITAMIN D3) 5000 units CAPS Take 5,000 Units by mouth daily.     citalopram (CELEXA) 20 MG tablet Take 20 mg by mouth daily.     COVID-19 mRNA Vac-TriS, Pfizer, (PFIZER-BIONT COVID-19 VAC-TRIS) SUSP injection Inject into the muscle. 0.3 mL 0   ezetimibe (ZETIA) 10 MG tablet Take 10 mg by mouth daily.     fluticasone (FLONASE) 50 MCG/ACT nasal spray Place 1 spray into both nostrils daily.      losartan-hydrochlorothiazide (HYZAAR) 100-25 MG tablet Take 0.5 tablets by mouth daily.     Multiple Vitamins-Minerals (MULTIVITAMIN PO) Take 6 each by mouth daily.      naproxen sodium (ALEVE) 220 MG tablet Take 220 mg by mouth daily.     omeprazole (PRILOSEC) 20 MG capsule Take 20 mg by mouth daily.      OXYGEN Inhale 2 L into the lungs continuous.      tiotropium (SPIRIVA) 18 MCG inhalation capsule Place 1 capsule (18 mcg total) into inhaler and inhale daily. 30 capsule 6   COENZYME Q10 PO Take 10 mLs by mouth daily.  (Patient not taking: Reported on 12/06/2020)     levalbuterol (XOPENEX) 1.25 MG/3ML nebulizer solution  (  Patient not taking: Reported on 12/06/2020)     No current facility-administered medications for this encounter.    Physical Findings: The patient is in no acute distress. Patient is alert and oriented.  2 L of oxygen in place by nasal cannula.  height is 5\' 2"  (1.575 m) and weight is 173 lb 4 oz (78.6 kg). Her temporal temperature is 96.8 F (36 C) (abnormal). Her blood pressure is 132/87 and her pulse is 101 (abnormal). Her respiration is 18 and oxygen saturation is 94%. .  No significant changes. Lungs are clear to auscultation bilaterally. Heart has regular rate and rhythm. No palpable cervical, supraclavicular,  or axillary adenopathy. Abdomen soft, non-tender, normal bowel sounds.   Lab Findings: Lab Results  Component Value Date   WBC 6.8 02/01/2018   HGB 14.9 02/01/2018   HCT 45.8 02/01/2018   MCV 89.6 02/01/2018   PLT 157 02/01/2018    Radiographic Findings: CT Chest Wo Contrast  Result Date: 12/01/2020 CLINICAL DATA:  Non-small cell lung cancer diagnosed 13 months ago. Radiation therapy completed in August 2021. Assess treatment response. EXAM: CT CHEST WITHOUT CONTRAST TECHNIQUE: Multidetector CT imaging of the chest was performed following the standard protocol without IV contrast. COMPARISON:  Chest CT 05/14/2020 and 11/17/2019.  PET-CT 10/12/2019. FINDINGS: Cardiovascular: Diffuse atherosclerosis of the aorta, great vessels and coronary arteries again noted. No acute vascular findings on noncontrast imaging. The heart size is normal. There is no pericardial effusion. Mediastinum/Nodes: There are no enlarged mediastinal, hilar or axillary lymph nodes.Hilar assessment is limited by the lack of intravenous contrast, although the hilar contours appear unchanged. The thyroid gland, trachea and esophagus demonstrate no significant findings. Lungs/Pleura: There is no pleural effusion. Stable moderate centrilobular emphysema. The treated lesion posteriorly in the right upper lobe is slightly smaller, measuring 0.9 x 0.6 cm on image 70/5 (previously 1.1 x 0.7 cm). Small subpleural nodule anteriorly in the right upper lobe on image 44/5 is stable. Small perifissural nodule along the minor fissure on image 92/5 is stable. There is an enlarging peribronchovascular nodule in the left lower lobe which measures 7 mm on image 117/5. No other new or enlarging nodules. Stable linear scarring in the lingula. Upper abdomen: The visualized upper abdomen appears stable without suspicious findings. There are multiple hepatic cysts. There is a stable 2.6 cm left adrenal adenoma. Curvilinear calcification in the mid right  kidney measuring 1.8 cm on image 199/2 is unchanged from previous PET-CT. Musculoskeletal/Chest wall: There is no chest wall mass or suspicious osseous finding. IMPRESSION: 1. Continued decreased size of treated nodule posteriorly in the right upper lobe, consistent with response to therapy. 2. Enlarging indeterminate peribronchovascular nodule (7 mm) in the left lower lobe, too small to evaluate by PET-CT. Recommend attention on follow-up chest CT in 6 months. 3. No other significant changes or evidence of metastatic disease. Stable incidental findings in the upper abdomen. 4. Coronary and Aortic Atherosclerosis (ICD10-I70.0). Emphysema (ICD10-J43.9). Electronically Signed   By: Richardean Sale M.D.   On: 12/01/2020 12:58    Impression: PET avid right upper lobe spiculated pulmonary nodule concerning for primary bronchogenic carcinoma   No evidence of recurrence on clinical exam today.  Recent chest CT scan shows good response to her SBRT.  Plan: Routine follow-up in 6 months.  Prior to this f/u the patient will undergo another chest CT scan.   20 minutes of total time was spent for this patient encounter, including preparation, face-to-face counseling with the patient and coordination of care,  physical exam, and documentation of the encounter and ordering of upcoming chest CT scan. ____________________________________  Blair Promise, PhD, MD   This document serves as a record of services personally performed by Gery Pray, MD. It was created on his behalf by Roney Mans, a trained medical scribe. The creation of this record is based on the scribe's personal observations and the provider's statements to them. This document has been checked and approved by the attending provider.

## 2020-12-04 NOTE — Telephone Encounter (Signed)
CALLED PATIENT TO ASK ABOUT RESCHEDULING MISSED FU FOR 12-03-20, SPOKE WITH PATIENT AND SHE AGREED TO COME ON 12-06-20 @ 9:45 AM

## 2020-12-06 ENCOUNTER — Ambulatory Visit
Admission: RE | Admit: 2020-12-06 | Discharge: 2020-12-06 | Disposition: A | Discharge status: Home / Self Care | Payer: PPO | Source: Ambulatory Visit | Attending: Radiation Oncology | Admitting: Radiation Oncology

## 2020-12-06 ENCOUNTER — Other Ambulatory Visit: Payer: Self-pay

## 2020-12-06 ENCOUNTER — Encounter: Payer: Self-pay | Admitting: Radiation Oncology

## 2020-12-06 VITALS — BP 132/87 | HR 101 | Temp 96.8°F | Resp 18 | Ht 62.0 in | Wt 173.2 lb

## 2020-12-06 DIAGNOSIS — D3502 Benign neoplasm of left adrenal gland: Secondary | ICD-10-CM | POA: Insufficient documentation

## 2020-12-06 DIAGNOSIS — Z87442 Personal history of urinary calculi: Secondary | ICD-10-CM | POA: Diagnosis not present

## 2020-12-06 DIAGNOSIS — I700 Atherosclerosis of aorta: Secondary | ICD-10-CM | POA: Insufficient documentation

## 2020-12-06 DIAGNOSIS — Z7951 Long term (current) use of inhaled steroids: Secondary | ICD-10-CM | POA: Diagnosis not present

## 2020-12-06 DIAGNOSIS — K7689 Other specified diseases of liver: Secondary | ICD-10-CM | POA: Insufficient documentation

## 2020-12-06 DIAGNOSIS — Z79899 Other long term (current) drug therapy: Secondary | ICD-10-CM | POA: Insufficient documentation

## 2020-12-06 DIAGNOSIS — R918 Other nonspecific abnormal finding of lung field: Secondary | ICD-10-CM | POA: Diagnosis not present

## 2020-12-06 DIAGNOSIS — J432 Centrilobular emphysema: Secondary | ICD-10-CM | POA: Insufficient documentation

## 2020-12-06 DIAGNOSIS — Z923 Personal history of irradiation: Secondary | ICD-10-CM | POA: Insufficient documentation

## 2020-12-06 DIAGNOSIS — I2510 Atherosclerotic heart disease of native coronary artery without angina pectoris: Secondary | ICD-10-CM | POA: Diagnosis not present

## 2020-12-06 DIAGNOSIS — Z08 Encounter for follow-up examination after completed treatment for malignant neoplasm: Secondary | ICD-10-CM | POA: Diagnosis not present

## 2020-12-06 DIAGNOSIS — R911 Solitary pulmonary nodule: Secondary | ICD-10-CM | POA: Diagnosis not present

## 2020-12-06 NOTE — Progress Notes (Signed)
Tene Gato is here today for follow up post radiation to the lung.  Lung Side: right  Does the patient complain of any of the following: Pain:denies Shortness of breath w/wo exertion: SOB with activity Cough: denies Hemoptysis: denies Pain with swallowing: denies Swallowing/choking concerns: denies Appetite: good Energy Level: fair Post radiation skin Changes: denies    Additional comments if applicable: none   Vitals:   12/06/20 0945  BP: 132/87  Pulse: (!) 101  Resp: 18  Temp: (!) 96.8 F (36 C)  TempSrc: Temporal  SpO2: 94%  Weight: 173 lb 4 oz (78.6 kg)  Height: 5\' 2"  (1.575 m)

## 2020-12-19 DIAGNOSIS — J441 Chronic obstructive pulmonary disease with (acute) exacerbation: Secondary | ICD-10-CM | POA: Diagnosis not present

## 2020-12-19 DIAGNOSIS — E782 Mixed hyperlipidemia: Secondary | ICD-10-CM | POA: Diagnosis not present

## 2020-12-19 DIAGNOSIS — E7800 Pure hypercholesterolemia, unspecified: Secondary | ICD-10-CM | POA: Diagnosis not present

## 2020-12-19 DIAGNOSIS — J449 Chronic obstructive pulmonary disease, unspecified: Secondary | ICD-10-CM | POA: Diagnosis not present

## 2020-12-19 DIAGNOSIS — D4100 Neoplasm of uncertain behavior of unspecified kidney: Secondary | ICD-10-CM | POA: Diagnosis not present

## 2020-12-19 DIAGNOSIS — I2510 Atherosclerotic heart disease of native coronary artery without angina pectoris: Secondary | ICD-10-CM | POA: Diagnosis not present

## 2020-12-19 DIAGNOSIS — I10 Essential (primary) hypertension: Secondary | ICD-10-CM | POA: Diagnosis not present

## 2020-12-20 NOTE — Addendum Note (Signed)
Encounter addended by: George Ina, RD on: 12/20/2020 7:25 AM  Actions taken: Flowsheet data copied forward, Flowsheet accepted

## 2020-12-24 NOTE — Progress Notes (Signed)
Discharge Progress Report  Patient Details  Name: Brandy Morales MRN: [id number] Date of Birth: [birth year] Referring Provider:   [name] Pulmonary Rehab Walk Test from 09/24/2020 in [facility]  Referring Provider Dr. Camillo Flaming        Number of Visits: 14  Reason for Discharge:  Patient reached a stable level of exercise. Patient independent in their exercise. Patient has met program and personal goals.  Smoking History:  Social History   Tobacco Use  Smoking Status Former   Packs/day: 2.00   Years: 55.00   Pack years: 110.00   Types: Cigarettes   Quit date: 2016   Years since quitting: 6.5  Smokeless Tobacco Never    Diagnosis:  Chronic respiratory failure with hypoxia ([place])  ADL [facility]SD:  Pulmonary Assessment Scores     Row Name 09/24/20 1001 09/24/20 1139 11/29/20 1526     ADL UCSD   ADL Phase Entry Entry Exit   SOB Score total 75 -- 76     CAT Score   CAT Score 24 -- 25     mMRC Score   mMRC Score -- 4 4            Initial Exercise Prescription:  Initial Exercise Prescription - 09/24/20 1100       Date of Initial Exercise RX and Referring Provider   Date 09/24/20    Referring Provider Dr. Camillo Flaming    Expected Discharge Date 11/29/20      Oxygen   Oxygen Continuous    Liters 2-3      NuStep   Level 1    SPM 80    Minutes 15      Track   Minutes 15      Prescription Details   Frequency (times per week) 2    Duration Progress to 30 minutes of continuous aerobic without signs/symptoms of physical distress      Intensity   THRR 40-80% of Max Heartrate 58-117    Ratings of Perceived Exertion 11-13    Perceived Dyspnea 0-4      Progression   Progression Continue to progress workloads to maintain intensity without signs/symptoms of physical distress.      Resistance Training   Training Prescription Yes    Weight Red bands    Reps 10-15             Discharge Exercise Prescription (Final Exercise  Prescription Changes):  Exercise Prescription Changes - 11/20/20 1400       Response to Exercise   Blood Pressure (Admit) 110/70    Blood Pressure (Exit) 100/63    Heart Rate (Admit) 96 bpm    Heart Rate (Exercise) 114 bpm    Heart Rate (Exit) 106 bpm    Oxygen Saturation (Admit) 95 %    Oxygen Saturation (Exercise) 93 %    Oxygen Saturation (Exit) 96 %    Rating of Perceived Exertion (Exercise) 13    Perceived Dyspnea (Exercise) 2    Duration Continue with 30 min of aerobic exercise without signs/symptoms of physical distress.    Intensity THRR unchanged      Progression   Progression Continue to progress workloads to maintain intensity without signs/symptoms of physical distress.      Resistance Training   Training Prescription Yes    Weight Red Bands    Reps 10-15    Time 10 Minutes      Oxygen   Oxygen Continuous    Liters 3  NuStep   Level 3    SPM 80    Minutes 15    METs 1.6      Track   Laps 5    Minutes 15             Functional Capacity:  [place] Name 09/24/20 1130 11/29/20 1523       6 Minute Walk   Phase Initial Discharge    Distance 899 feet 800 feet    Distance % Change -- -11.01 %    Distance Feet Change -- -99 ft    Walk Time 6 minutes 6 minutes    # of Rest Breaks 0 0    MPH 1.7 1.52    METS 1.97 1.68    RPE 13 11    Perceived Dyspnea  2 1    VO2 Peak 6.89 5.86    Symptoms No No  Pulse oximeter was not giving accurate readings during walk test and had to stop pt 3 times to adjust pulse ox.    Resting HR 95 bpm 97 bpm    Resting BP 114/79 108/80    Resting Oxygen Saturation  94 % 98 %    Exercise Oxygen Saturation  during 6 min walk 87 % 80 %    Max Ex. HR 113 bpm 111 bpm    Max Ex. BP 134/83 123/79    2 Minute Post BP 107/79 118/86         Interval HR      1 Minute HR 99 101    2 Minute HR 99 111    3 Minute HR 105 111    4 Minute HR 110 110    5 Minute HR 106 108    6 Minute HR 113 111    2 Minute  Post HR 99 94    Interval Heart Rate? Yes --         Interval Oxygen      Interval Oxygen? Yes --    Baseline Oxygen Saturation % 94 % 98 %    1 Minute Oxygen Saturation % 91 % 95 %    1 Minute Liters of Oxygen 2 L 3 L    2 Minute Oxygen Saturation % 87 % 93 %    2 Minute Liters of Oxygen 2 L  Increased to 3L 3 L    3 Minute Oxygen Saturation % 89 % 90 %    3 Minute Liters of Oxygen 3 L 3 L    4 Minute Oxygen Saturation % 89 % 92 %    4 Minute Liters of Oxygen 3 L 3 L    5 Minute Oxygen Saturation % 94 % 80 %    5 Minute Liters of Oxygen 3 L 3 L  Increased to 4L    6 Minute Oxygen Saturation % 91 % 95 %    6 Minute Liters of Oxygen 3 L 4 L    2 Minute Post Oxygen Saturation % 94 % 99 %    2 Minute Post Liters of Oxygen 3 L 4 L            Psychological, QOL, Others - Outcomes: PHQ 2/9: Depression screen [facility] 2/9 11/29/2020 09/24/2020 09/24/2020 09/24/2020  Decreased Interest 0 0 - 0  Down, Depressed, Hopeless 0 0 - 0  PHQ - 2 Score 0 0 - 0  Altered sleeping 0 - 2 -  Tired, decreased energy 2 -  1 -  Change in appetite 0 - 0 -  Feeling bad or failure about yourself  0 - 0 -  Trouble concentrating 0 - 0 -  Moving slowly or fidgety/restless 0 - 0 -  Suicidal thoughts 0 - 0 -  PHQ-9 Score 2 - - -  Difficult doing work/chores Not difficult at all - Not difficult at all -    Quality of Life:   Personal Goals: Goals established at orientation with interventions provided to work toward goal.  Personal Goals and Risk Factors at Admission - 09/24/20 1036       Core Components/Risk Factors/Patient Goals on Admission   Improve shortness of breath with ADL's Yes    Intervention Provide education, individualized exercise plan and daily activity instruction to help decrease symptoms of SOB with activities of daily living.    Expected Outcomes Short Term: Improve cardiorespiratory fitness to achieve a reduction of symptoms when performing ADLs;Long Term: Be able to perform more ADLs  without symptoms or delay the onset of symptoms              Personal Goals Discharge:  Goals and Risk Factor Review     Row Name 09/24/20 1036 10/08/20 1354 11/12/20 1015         Core Components/Risk Factors/Patient Goals Review   Personal Goals Review Develop more efficient breathing techniques such as purse lipped breathing and diaphragmatic breathing and practicing self-pacing with activity.;Increase knowledge of respiratory medications and ability to use respiratory devices properly.;Improve shortness of breath with ADL's Develop more efficient breathing techniques such as purse lipped breathing and diaphragmatic breathing and practicing self-pacing with activity.;Increase knowledge of respiratory medications and ability to use respiratory devices properly.;Improve shortness of breath with ADL's Develop more efficient breathing techniques such as purse lipped breathing and diaphragmatic breathing and practicing self-pacing with activity.;Increase knowledge of respiratory medications and ability to use respiratory devices properly.;Improve shortness of breath with ADL's     Review -- Jarelyn has attended 2 exercise sessions and is extremely short of breath.  We are working with her to purse lip breathe and give her lots of support. Desma has made great progress while in pulmonary rehab.  She was house bound for 2 years during the covid pandemic and had become very deconditioned.  She reports having more strength and stamina when performing tasks at home.  She is level 3 on the nustep and walking 9 laps on the track in 15 minutes.     Expected Outcomes -- See admission goals. See admission goals.              Exercise Goals and Review:  Exercise Goals     Row Name 09/24/20 1128             Exercise Goals   Increase Physical Activity Yes       Intervention Provide advice, education, support and counseling about physical activity/exercise needs.;Develop an individualized exercise  prescription for aerobic and resistive training based on initial evaluation findings, risk stratification, comorbidities and participant's personal goals.       Expected Outcomes Short Term: Attend rehab on a regular basis to increase amount of physical activity.;Long Term: Add in home exercise to make exercise part of routine and to increase amount of physical activity.;Long Term: Exercising regularly at least 3-5 days a week.       Increase Strength and Stamina Yes       Intervention Provide advice, education, support and counseling about physical activity/exercise needs.;Develop  an individualized exercise prescription for aerobic and resistive training based on initial evaluation findings, risk stratification, comorbidities and participant's personal goals.       Expected Outcomes Short Term: Increase workloads from initial exercise prescription for resistance, speed, and METs.;Short Term: Perform resistance training exercises routinely during rehab and add in resistance training at home;Long Term: Improve cardiorespiratory fitness, muscular endurance and strength as measured by increased METs and functional capacity (6MWT)       Able to understand and use rate of perceived exertion (RPE) scale Yes       Intervention Provide education and explanation on how to use RPE scale       Expected Outcomes Short Term: Able to use RPE daily in rehab to express subjective intensity level;Long Term:  Able to use RPE to guide intensity level when exercising independently       Able to understand and use Dyspnea scale Yes       Intervention Provide education and explanation on how to use Dyspnea scale       Expected Outcomes Short Term: Able to use Dyspnea scale daily in rehab to express subjective sense of shortness of breath during exertion;Long Term: Able to use Dyspnea scale to guide intensity level when exercising independently       Knowledge and understanding of Target Heart Rate Range (THRR) Yes        Intervention Provide education and explanation of THRR including how the numbers were predicted and where they are located for reference       Expected Outcomes Short Term: Able to state/look up THRR;Long Term: Able to use THRR to govern intensity when exercising independently;Short Term: Able to use daily as guideline for intensity in rehab       Understanding of Exercise Prescription Yes       Intervention Provide education, explanation, and written materials on patient's individual exercise prescription       Expected Outcomes Short Term: Able to explain program exercise prescription;Long Term: Able to explain home exercise prescription to exercise independently                Exercise Goals Re-Evaluation:  Exercise Goals Re-Evaluation     Row Name 10/16/20 5102 11/12/20 1128 11/12/20 1147         Exercise Goal Re-Evaluation   Exercise Goals Review Increase Physical Activity;Increase Strength and Stamina;Able to understand and use rate of perceived exertion (RPE) scale;Able to understand and use Dyspnea scale;Knowledge and understanding of Target Heart Rate Range (THRR);Understanding of Exercise Prescription Increase Physical Activity;Increase Strength and Stamina;Able to understand and use rate of perceived exertion (RPE) scale;Able to understand and use Dyspnea scale;Knowledge and understanding of Target Heart Rate Range (THRR);Understanding of Exercise Prescription (P)  Increase Physical Activity;Increase Strength and Stamina;Able to understand and use rate of perceived exertion (RPE) scale;Able to understand and use Dyspnea scale;Knowledge and understanding of Target Heart Rate Range (THRR);Understanding of Exercise Prescription     Comments Patient has completed 3 exercise sessions and has tolerated well so far. She is very deconditioned but has been working hard so far. She is able to do the Nustep for 15 minutes with no rest breaks and is exercising at 1.3 METS. She also walks the  track for 15 minutes but does have to take a few rest breaks during that time due to fatigue and shortness of breath. She is exercising at 2 METS walking the track. Will continue to monitor and progress as she is able. -- Colandra has  completed 10 exercise sessions and has been consistent with workload and MET level increases. She has done well throughout the program so far and is not having to take as many rest breaks on the track. She is exercising at 1.6 METS on the Nustep and 2.05 METS on the track. Will continue to monitor and progress as she is able.     Expected Outcomes Through exercise at rehab and home, the patient will decrease shortness of breath with daily activities and feel confident in carrying out an exercise regimn at home. Through exercise at rehab and home, the patient will decrease shortness of breath with daily activities and feel confident in carrying out an exercise regimn at home. (P)  Through exercise at rehab and home, the patient will decrease shortness of breath with daily activities and feel confident in carrying out an exercise regimn at home.              Nutrition & Weight - Outcomes:  Pre Biometrics - 09/24/20 0945       Pre Biometrics   Height _0  (1.626 m)    Weight 78.2 kg    BMI (Calculated) 29.58    Grip Strength 15 kg             Post Biometrics - 11/29/20 1517        Post  Biometrics   Grip Strength 19 kg             Nutrition:  Nutrition Therapy & Goals - 10/04/20 1433       Nutrition Therapy   Diet Generally Healthful      Personal Nutrition Goals   Nutrition Goal Pt to identify food quantities necessary to achieve weight loss of 6-24 lb at graduation from pulmonary rehab.    Personal Goal #2 Pt to build a healthy plate including vegetables, fruits, whole grains, and low-fat dairy products in a heart healthy meal plan.      Intervention Plan   Intervention Prescribe, educate and counsel regarding individualized specific dietary  modifications aiming towards targeted core components such as weight, hypertension, lipid management, diabetes, heart failure and other comorbidities.    Expected Outcomes Long Term Goal: Adherence to prescribed nutrition plan.;Short Term Goal: Understand basic principles of dietary content, such as calories, fat, sodium, cholesterol and nutrients.             Nutrition Discharge:   Education Questionnaire Score:  Knowledge Questionnaire Score - 11/29/20 1530       Knowledge Questionnaire Score   Post Score 7/8   Did not fill out the back of knowledge test            Goals reviewed with patient; copy given to patient.

## 2020-12-24 NOTE — Addendum Note (Signed)
Encounter addended by: Lance Morin, RN on: 12/24/2020 1:54 PM  Actions taken: Episode resolved

## 2020-12-24 NOTE — Addendum Note (Signed)
Encounter addended by: Lance Morin, RN on: 12/24/2020 1:50 PM  Actions taken: Clinical Note Signed

## 2020-12-28 ENCOUNTER — Encounter (HOSPITAL_COMMUNITY): Payer: Self-pay

## 2020-12-28 ENCOUNTER — Inpatient Hospital Stay (HOSPITAL_COMMUNITY)
Admission: EM | Admit: 2020-12-28 | Discharge: 2021-01-03 | DRG: 660 | Disposition: A | Discharge status: Home Health | Payer: PPO | Attending: Internal Medicine | Admitting: Internal Medicine

## 2020-12-28 ENCOUNTER — Emergency Department (HOSPITAL_COMMUNITY): Payer: PPO

## 2020-12-28 ENCOUNTER — Other Ambulatory Visit: Payer: Self-pay

## 2020-12-28 DIAGNOSIS — D6959 Other secondary thrombocytopenia: Secondary | ICD-10-CM | POA: Diagnosis present

## 2020-12-28 DIAGNOSIS — F419 Anxiety disorder, unspecified: Secondary | ICD-10-CM | POA: Diagnosis present

## 2020-12-28 DIAGNOSIS — D72825 Bandemia: Secondary | ICD-10-CM | POA: Diagnosis not present

## 2020-12-28 DIAGNOSIS — Z85118 Personal history of other malignant neoplasm of bronchus and lung: Secondary | ICD-10-CM

## 2020-12-28 DIAGNOSIS — K219 Gastro-esophageal reflux disease without esophagitis: Secondary | ICD-10-CM | POA: Diagnosis present

## 2020-12-28 DIAGNOSIS — R112 Nausea with vomiting, unspecified: Secondary | ICD-10-CM | POA: Diagnosis not present

## 2020-12-28 DIAGNOSIS — I10 Essential (primary) hypertension: Secondary | ICD-10-CM | POA: Diagnosis not present

## 2020-12-28 DIAGNOSIS — N179 Acute kidney failure, unspecified: Secondary | ICD-10-CM | POA: Diagnosis not present

## 2020-12-28 DIAGNOSIS — E162 Hypoglycemia, unspecified: Secondary | ICD-10-CM | POA: Diagnosis not present

## 2020-12-28 DIAGNOSIS — B958 Unspecified staphylococcus as the cause of diseases classified elsewhere: Secondary | ICD-10-CM | POA: Diagnosis not present

## 2020-12-28 DIAGNOSIS — R7881 Bacteremia: Secondary | ICD-10-CM | POA: Diagnosis not present

## 2020-12-28 DIAGNOSIS — Z7189 Other specified counseling: Secondary | ICD-10-CM

## 2020-12-28 DIAGNOSIS — E860 Dehydration: Secondary | ICD-10-CM | POA: Diagnosis not present

## 2020-12-28 DIAGNOSIS — Z9981 Dependence on supplemental oxygen: Secondary | ICD-10-CM | POA: Diagnosis not present

## 2020-12-28 DIAGNOSIS — E669 Obesity, unspecified: Secondary | ICD-10-CM | POA: Diagnosis present

## 2020-12-28 DIAGNOSIS — R531 Weakness: Secondary | ICD-10-CM | POA: Diagnosis not present

## 2020-12-28 DIAGNOSIS — Z79899 Other long term (current) drug therapy: Secondary | ICD-10-CM

## 2020-12-28 DIAGNOSIS — N19 Unspecified kidney failure: Secondary | ICD-10-CM

## 2020-12-28 DIAGNOSIS — R339 Retention of urine, unspecified: Secondary | ICD-10-CM | POA: Diagnosis present

## 2020-12-28 DIAGNOSIS — J449 Chronic obstructive pulmonary disease, unspecified: Secondary | ICD-10-CM

## 2020-12-28 DIAGNOSIS — Z923 Personal history of irradiation: Secondary | ICD-10-CM

## 2020-12-28 DIAGNOSIS — Z90710 Acquired absence of both cervix and uterus: Secondary | ICD-10-CM

## 2020-12-28 DIAGNOSIS — Z6832 Body mass index (BMI) 32.0-32.9, adult: Secondary | ICD-10-CM

## 2020-12-28 DIAGNOSIS — K5900 Constipation, unspecified: Secondary | ICD-10-CM | POA: Diagnosis not present

## 2020-12-28 DIAGNOSIS — R0609 Other forms of dyspnea: Secondary | ICD-10-CM

## 2020-12-28 DIAGNOSIS — J9611 Chronic respiratory failure with hypoxia: Secondary | ICD-10-CM | POA: Diagnosis present

## 2020-12-28 DIAGNOSIS — N201 Calculus of ureter: Secondary | ICD-10-CM

## 2020-12-28 DIAGNOSIS — K828 Other specified diseases of gallbladder: Secondary | ICD-10-CM | POA: Diagnosis not present

## 2020-12-28 DIAGNOSIS — Z87442 Personal history of urinary calculi: Secondary | ICD-10-CM

## 2020-12-28 DIAGNOSIS — N200 Calculus of kidney: Secondary | ICD-10-CM | POA: Diagnosis not present

## 2020-12-28 DIAGNOSIS — E161 Other hypoglycemia: Secondary | ICD-10-CM | POA: Diagnosis not present

## 2020-12-28 DIAGNOSIS — Z87891 Personal history of nicotine dependence: Secondary | ICD-10-CM | POA: Diagnosis not present

## 2020-12-28 DIAGNOSIS — Z7951 Long term (current) use of inhaled steroids: Secondary | ICD-10-CM | POA: Diagnosis not present

## 2020-12-28 DIAGNOSIS — E872 Acidosis: Secondary | ICD-10-CM | POA: Diagnosis not present

## 2020-12-28 DIAGNOSIS — B9620 Unspecified Escherichia coli [E. coli] as the cause of diseases classified elsewhere: Secondary | ICD-10-CM | POA: Diagnosis present

## 2020-12-28 DIAGNOSIS — Z20822 Contact with and (suspected) exposure to covid-19: Secondary | ICD-10-CM | POA: Diagnosis present

## 2020-12-28 DIAGNOSIS — N390 Urinary tract infection, site not specified: Secondary | ICD-10-CM

## 2020-12-28 DIAGNOSIS — I7300 Raynaud's syndrome without gangrene: Secondary | ICD-10-CM

## 2020-12-28 DIAGNOSIS — R0600 Dyspnea, unspecified: Secondary | ICD-10-CM

## 2020-12-28 DIAGNOSIS — D649 Anemia, unspecified: Secondary | ICD-10-CM | POA: Diagnosis not present

## 2020-12-28 DIAGNOSIS — R0902 Hypoxemia: Secondary | ICD-10-CM

## 2020-12-28 DIAGNOSIS — R7989 Other specified abnormal findings of blood chemistry: Secondary | ICD-10-CM | POA: Diagnosis not present

## 2020-12-28 DIAGNOSIS — R062 Wheezing: Secondary | ICD-10-CM | POA: Diagnosis not present

## 2020-12-28 DIAGNOSIS — N136 Pyonephrosis: Secondary | ICD-10-CM | POA: Diagnosis present

## 2020-12-28 DIAGNOSIS — B957 Other staphylococcus as the cause of diseases classified elsewhere: Secondary | ICD-10-CM | POA: Diagnosis present

## 2020-12-28 DIAGNOSIS — K6389 Other specified diseases of intestine: Secondary | ICD-10-CM | POA: Diagnosis not present

## 2020-12-28 DIAGNOSIS — R000 Tachycardia, unspecified: Secondary | ICD-10-CM | POA: Diagnosis not present

## 2020-12-28 DIAGNOSIS — R1084 Generalized abdominal pain: Secondary | ICD-10-CM | POA: Diagnosis not present

## 2020-12-28 DIAGNOSIS — K5730 Diverticulosis of large intestine without perforation or abscess without bleeding: Secondary | ICD-10-CM | POA: Diagnosis not present

## 2020-12-28 DIAGNOSIS — J439 Emphysema, unspecified: Secondary | ICD-10-CM | POA: Diagnosis not present

## 2020-12-28 DIAGNOSIS — N132 Hydronephrosis with renal and ureteral calculous obstruction: Secondary | ICD-10-CM | POA: Diagnosis not present

## 2020-12-28 LAB — CBC WITH DIFFERENTIAL/PLATELET
Abs Immature Granulocytes: 0.12 10*3/uL — ABNORMAL HIGH (ref 0.00–0.07)
Basophils Absolute: 0.1 10*3/uL (ref 0.0–0.1)
Basophils Relative: 0 %
Eosinophils Absolute: 0.2 10*3/uL (ref 0.0–0.5)
Eosinophils Relative: 1 %
HCT: 42.2 % (ref 36.0–46.0)
Hemoglobin: 13.6 g/dL (ref 12.0–15.0)
Immature Granulocytes: 1 %
Lymphocytes Relative: 4 %
Lymphs Abs: 0.6 10*3/uL — ABNORMAL LOW (ref 0.7–4.0)
MCH: 28.6 pg (ref 26.0–34.0)
MCHC: 32.2 g/dL (ref 30.0–36.0)
MCV: 88.8 fL (ref 80.0–100.0)
Monocytes Absolute: 1.1 10*3/uL — ABNORMAL HIGH (ref 0.1–1.0)
Monocytes Relative: 6 %
Neutro Abs: 14.4 10*3/uL — ABNORMAL HIGH (ref 1.7–7.7)
Neutrophils Relative %: 88 %
Platelets: 115 10*3/uL — ABNORMAL LOW (ref 150–400)
RBC: 4.75 MIL/uL (ref 3.87–5.11)
RDW: 13.9 % (ref 11.5–15.5)
WBC: 16.4 10*3/uL — ABNORMAL HIGH (ref 4.0–10.5)
nRBC: 0 % (ref 0.0–0.2)

## 2020-12-28 LAB — RESP PANEL BY RT-PCR (FLU A&B, COVID) ARPGX2
Influenza A by PCR: NEGATIVE
Influenza B by PCR: NEGATIVE
SARS Coronavirus 2 by RT PCR: NEGATIVE

## 2020-12-28 LAB — I-STAT CHEM 8, ED
BUN: 86 mg/dL — ABNORMAL HIGH (ref 8–23)
Calcium, Ion: 1.08 mmol/L — ABNORMAL LOW (ref 1.15–1.40)
Chloride: 99 mmol/L (ref 98–111)
Creatinine, Ser: 4.5 mg/dL — ABNORMAL HIGH (ref 0.44–1.00)
Glucose, Bld: 78 mg/dL (ref 70–99)
HCT: 42 % (ref 36.0–46.0)
Hemoglobin: 14.3 g/dL (ref 12.0–15.0)
Potassium: 3.9 mmol/L (ref 3.5–5.1)
Sodium: 135 mmol/L (ref 135–145)
TCO2: 26 mmol/L (ref 22–32)

## 2020-12-28 LAB — URINALYSIS, ROUTINE W REFLEX MICROSCOPIC
Bilirubin Urine: NEGATIVE
Glucose, UA: NEGATIVE mg/dL
Ketones, ur: 5 mg/dL — AB
Nitrite: NEGATIVE
Protein, ur: 100 mg/dL — AB
Specific Gravity, Urine: 1.011 (ref 1.005–1.030)
pH: 5 (ref 5.0–8.0)

## 2020-12-28 LAB — COMPREHENSIVE METABOLIC PANEL
ALT: 32 U/L (ref 0–44)
AST: 29 U/L (ref 15–41)
Albumin: 3.1 g/dL — ABNORMAL LOW (ref 3.5–5.0)
Alkaline Phosphatase: 80 U/L (ref 38–126)
Anion gap: 17 — ABNORMAL HIGH (ref 5–15)
BUN: 90 mg/dL — ABNORMAL HIGH (ref 8–23)
CO2: 23 mmol/L (ref 22–32)
Calcium: 8.8 mg/dL — ABNORMAL LOW (ref 8.9–10.3)
Chloride: 98 mmol/L (ref 98–111)
Creatinine, Ser: 4.33 mg/dL — ABNORMAL HIGH (ref 0.44–1.00)
GFR, Estimated: 10 mL/min — ABNORMAL LOW (ref >60)
Glucose, Bld: 83 mg/dL (ref 70–99)
Potassium: 3.9 mmol/L (ref 3.5–5.1)
Sodium: 138 mmol/L (ref 135–145)
Total Bilirubin: 1.1 mg/dL (ref 0.3–1.2)
Total Protein: 6.4 g/dL — ABNORMAL LOW (ref 6.5–8.1)

## 2020-12-28 LAB — TROPONIN I (HIGH SENSITIVITY)
Troponin I (High Sensitivity): 26 ng/L — ABNORMAL HIGH (ref <18)
Troponin I (High Sensitivity): 26 ng/L — ABNORMAL HIGH (ref <18)

## 2020-12-28 LAB — LACTIC ACID, PLASMA
Lactic Acid, Venous: 1.6 mmol/L (ref 0.5–1.9)
Lactic Acid, Venous: 2.3 mmol/L (ref 0.5–1.9)

## 2020-12-28 LAB — MAGNESIUM: Magnesium: 2.2 mg/dL (ref 1.7–2.4)

## 2020-12-28 LAB — BRAIN NATRIURETIC PEPTIDE: B Natriuretic Peptide: 401.1 pg/mL — ABNORMAL HIGH (ref 0.0–100.0)

## 2020-12-28 MED ORDER — SENNOSIDES-DOCUSATE SODIUM 8.6-50 MG PO TABS: 1.0000 | ORAL_TABLET | Freq: Two times a day (BID) | ORAL | Status: DC | PRN

## 2020-12-28 MED ORDER — POLYETHYLENE GLYCOL 3350 17 G PO PACK: 17.0000 g | PACK | Freq: Two times a day (BID) | ORAL | Status: DC | PRN

## 2020-12-28 MED ORDER — POLYVINYL ALCOHOL 1.4 % OP SOLN
Freq: Every day | OPHTHALMIC | Status: DC
  Administered 2020-12-29: 1 [drp] via OPHTHALMIC
  Filled 2020-12-28 (×2): qty 15

## 2020-12-28 MED ORDER — LACTATED RINGERS IV BOLUS
500.0000 mL | Freq: Once | INTRAVENOUS | Status: AC
  Administered 2020-12-28: 500 mL via INTRAVENOUS

## 2020-12-28 MED ORDER — PIPERACILLIN-TAZOBACTAM 3.375 G IVPB
3.3750 g | Freq: Once | INTRAVENOUS | Status: DC
  Filled 2020-12-28: qty 50

## 2020-12-28 MED ORDER — ONDANSETRON HCL 4 MG PO TABS: 4.0000 mg | ORAL_TABLET | Freq: Four times a day (QID) | ORAL | Status: DC | PRN

## 2020-12-28 MED ORDER — ATORVASTATIN CALCIUM 10 MG PO TABS
5.0000 mg | ORAL_TABLET | ORAL | Status: DC
  Administered 2020-12-28 – 2021-01-02 (×3): 5 mg via ORAL
  Filled 2020-12-28 (×3): qty 1

## 2020-12-28 MED ORDER — LORATADINE 10 MG PO TABS
10.0000 mg | ORAL_TABLET | Freq: Every day | ORAL | Status: DC
  Administered 2020-12-29 – 2021-01-03 (×5): 10 mg via ORAL
  Filled 2020-12-28 (×6): qty 1

## 2020-12-28 MED ORDER — SODIUM CHLORIDE 0.9 % IV SOLN: 2.0000 g | INTRAVENOUS | Status: DC

## 2020-12-28 MED ORDER — FLUTICASONE FUROATE-VILANTEROL 200-25 MCG/INH IN AEPB
1.0000 | INHALATION_SPRAY | Freq: Every evening | RESPIRATORY_TRACT | Status: DC
  Administered 2020-12-29 – 2020-12-31 (×3): 1 via RESPIRATORY_TRACT
  Filled 2020-12-28: qty 28

## 2020-12-28 MED ORDER — TAMSULOSIN HCL 0.4 MG PO CAPS
0.8000 mg | ORAL_CAPSULE | Freq: Every day | ORAL | Status: DC
  Administered 2020-12-28 – 2021-01-02 (×5): 0.8 mg via ORAL
  Filled 2020-12-28 (×4): qty 2

## 2020-12-28 MED ORDER — SODIUM CHLORIDE 0.9 % IV SOLN
2.0000 g | Freq: Once | INTRAVENOUS | Status: AC
  Administered 2020-12-28: 2 g via INTRAVENOUS
  Filled 2020-12-28: qty 2

## 2020-12-28 MED ORDER — FLUTICASONE PROPIONATE 50 MCG/ACT NA SUSP
1.0000 | Freq: Every day | NASAL | Status: DC
  Administered 2020-12-29 – 2021-01-03 (×7): 1 via NASAL
  Filled 2020-12-28: qty 16

## 2020-12-28 MED ORDER — FENTANYL CITRATE (PF) 100 MCG/2ML IJ SOLN: 25.0000 ug | INTRAMUSCULAR | Status: DC | PRN

## 2020-12-28 MED ORDER — HYDRALAZINE HCL 25 MG PO TABS: 25.0000 mg | ORAL_TABLET | Freq: Four times a day (QID) | ORAL | Status: DC | PRN

## 2020-12-28 MED ORDER — ONDANSETRON HCL 4 MG/2ML IJ SOLN: 4.0000 mg | Freq: Four times a day (QID) | INTRAMUSCULAR | Status: DC | PRN

## 2020-12-28 MED ORDER — FENTANYL CITRATE (PF) 100 MCG/2ML IJ SOLN: 12.5000 ug | INTRAMUSCULAR | Status: DC | PRN

## 2020-12-28 MED ORDER — EZETIMIBE 10 MG PO TABS
10.0000 mg | ORAL_TABLET | Freq: Every day | ORAL | Status: DC
  Administered 2020-12-28 – 2021-01-02 (×6): 10 mg via ORAL
  Filled 2020-12-28 (×7): qty 1

## 2020-12-28 MED ORDER — IPRATROPIUM-ALBUTEROL 0.5-2.5 (3) MG/3ML IN SOLN
3.0000 mL | Freq: Four times a day (QID) | RESPIRATORY_TRACT | Status: DC | PRN
  Administered 2020-12-30 – 2020-12-31 (×3): 3 mL via RESPIRATORY_TRACT
  Filled 2020-12-28 (×3): qty 3

## 2020-12-28 MED ORDER — ACETAMINOPHEN 325 MG PO TABS: 650.0000 mg | ORAL_TABLET | Freq: Four times a day (QID) | ORAL | Status: DC | PRN

## 2020-12-28 MED ORDER — ACETAMINOPHEN 650 MG RE SUPP: 650.0000 mg | Freq: Four times a day (QID) | RECTAL | Status: DC | PRN

## 2020-12-28 MED ORDER — UMECLIDINIUM BROMIDE 62.5 MCG/INH IN AEPB
1.0000 | INHALATION_SPRAY | Freq: Every day | RESPIRATORY_TRACT | Status: DC
  Administered 2020-12-29 – 2021-01-01 (×3): 1 via RESPIRATORY_TRACT
  Filled 2020-12-28 (×2): qty 7

## 2020-12-28 MED ORDER — SODIUM CHLORIDE 0.9 % IV SOLN: 1.0000 g | INTRAVENOUS | Status: DC

## 2020-12-28 MED ORDER — TAMSULOSIN HCL 0.4 MG PO CAPS: 0.8000 mg | ORAL_CAPSULE | Freq: Every day | ORAL | Status: DC

## 2020-12-28 MED ORDER — HEPARIN SODIUM (PORCINE) 5000 UNIT/ML IJ SOLN
5000.0000 [IU] | Freq: Three times a day (TID) | INTRAMUSCULAR | Status: DC
  Administered 2020-12-28 – 2020-12-29 (×2): 5000 [IU] via SUBCUTANEOUS
  Filled 2020-12-28 (×2): qty 1

## 2020-12-28 MED ORDER — PANTOPRAZOLE SODIUM 40 MG PO TBEC
40.0000 mg | DELAYED_RELEASE_TABLET | Freq: Every day | ORAL | Status: DC
  Administered 2020-12-29 – 2021-01-03 (×6): 40 mg via ORAL
  Filled 2020-12-28 (×6): qty 1

## 2020-12-28 MED ORDER — DEXTROSE-NACL 5-0.9 % IV SOLN: INTRAVENOUS | Status: DC

## 2020-12-28 MED ORDER — CITALOPRAM HYDROBROMIDE 20 MG PO TABS
20.0000 mg | ORAL_TABLET | Freq: Every day | ORAL | Status: DC
  Administered 2020-12-29 – 2021-01-03 (×6): 20 mg via ORAL
  Filled 2020-12-28: qty 1
  Filled 2020-12-28: qty 2
  Filled 2020-12-28 (×4): qty 1

## 2020-12-28 MED ORDER — VANCOMYCIN HCL 1500 MG/300ML IV SOLN
1500.0000 mg | Freq: Once | INTRAVENOUS | Status: AC
  Administered 2020-12-28: 1500 mg via INTRAVENOUS
  Filled 2020-12-28: qty 300

## 2020-12-28 MED ORDER — TAMSULOSIN HCL 0.4 MG PO CAPS
0.4000 mg | ORAL_CAPSULE | ORAL | Status: AC
  Administered 2020-12-28: 0.4 mg via ORAL
  Filled 2020-12-28: qty 1

## 2020-12-28 MED ORDER — VANCOMYCIN HCL IN DEXTROSE 1-5 GM/200ML-% IV SOLN: 1000.0000 mg | Freq: Once | INTRAVENOUS | Status: DC

## 2020-12-28 NOTE — ED Provider Notes (Signed)
[facility] DEPT Provider Note   CSN: [id number] Arrival date & time: 12/28/20  1047     History Chief Complaint  Patient presents with   Weakness    Brandy Morales is a 75 y.o. female.   Weakness Associated symptoms: abdominal pain   Associated symptoms: no arthralgias, no chest pain, no cough, no diarrhea, no dizziness, no dysuria, no fever, no headaches, no myalgias, no nausea, no seizures, no shortness of breath and no vomiting   Patient presents for 4 days of generalized weakness.  At baseline, she is independent and functional.  History is corroborated by her daughter who states that the patient was normal a week ago.  Patient's daughter noticed that the patient seemed very weak over the phone 3 days ago.  Patient reports that her weakness has progressed to the point that she can barely stand.  Her medical history is notable for COPD.  She states that her breathing currently feels normal to her.  She has a small area of pain on the left side of her abdomen.  She denies any other areas of pain.  She denies any fevers or chills.  She has had decreased p.o. intake.  She does have previous history of kidney stones.    Past Medical History:  Diagnosis Date   Anxiety    Asthma    COPD (chronic obstructive pulmonary disease) (HCC)    GERD (gastroesophageal reflux disease)    History of kidney stones    History of radiation therapy 01/05/2020   IMRT right lung 54 Gy in 3 Fractions 12/29/2019 through 01/05/2020  Dr Gery Pray   Hypertension     Patient Active Problem List   Diagnosis Date Noted   Renal calculi 11/30/2017   COPD (chronic obstructive pulmonary disease) ([place]) 10/22/2017   Chronic respiratory failure with hypoxia ([place]) 10/22/2017   Pulmonary nodules 10/22/2017   Preoperative clearance 10/22/2017   Dyspnea on exertion 03/18/2017   History of smoking greater than 50 pack years 03/18/2017   Hypoxemia 03/18/2017    Past Surgical  History:  Procedure Laterality Date   ABDOMINAL HYSTERECTOMY  age 6   partial 1 ovary left   APPENDECTOMY     colonscopy     CYSTOSCOPY WITH RETROGRADE PYELOGRAM, URETEROSCOPY AND STENT PLACEMENT N/A 11/30/2017   Procedure: CYSTOSCOPY WITH BILATERAL RETROGRADE PYELOGRAM, LEFT URETEROSCOPY/ LEFT LASER LITHO AND LEFT STENT PLACEMENT;  Surgeon: Lucas Mallow, MD;  Location: [facility];  Service: Urology;  Laterality: N/A;   CYSTOSCOPY WITH RETROGRADE PYELOGRAM, URETEROSCOPY AND STENT PLACEMENT Left 02/01/2018   Procedure: CYSTOSCOPY WITH LEFT RETROGRADE PYELOGRAM, URETEROSCOPY HOLMIUM LASER AND STENT PLACEMENT;  Surgeon: Lucas Mallow, MD;  Location: [facility];  Service: Urology;  Laterality: Left;   EYE SURGERY Bilateral    Cataract   TONSILLECTOMY       OB History   No obstetric history on file.     History reviewed. No pertinent family history.  Social History   Tobacco Use   Smoking status: Former    Packs/day: 2.00    Years: 55.00    Pack years: 110.00    Types: Cigarettes    Quit date: 2016    Years since quitting: 6.5   Smokeless tobacco: Never  Vaping Use   Vaping Use: Never used  Substance Use Topics   Alcohol use: Yes    Alcohol/week: 1.0 standard drink    Types: 1 Glasses of wine per week    Comment: weekly  Drug use: Never    Home Medications Prior to Admission medications   Medication Sig Start Date End Date Taking? Authorizing Provider  acyclovir (ZOVIRAX) 400 MG tablet Take 400 mg by mouth 3 (three) times daily as needed (for outbreak).    [provider]  Artificial Tear Solution (GENTEAL TEARS OP) Place 1 drop into both eyes daily.     [provider]  BREO ELLIPTA 200-25 MCG/INH AEPB Inhale 1 puff into the lungs daily. 09/06/19   [provider]  cetirizine (ZYRTEC) 10 MG tablet Take 10 mg by mouth daily. 09/06/19   [provider]  Cholecalciferol (VITAMIN D3) 5000 units CAPS Take 5,000 Units by mouth daily.     [provider]  citalopram (CELEXA) 20 MG tablet Take 20 mg by mouth daily.    [provider]  COENZYME Q10 PO Take 10 mLs by mouth daily.  Patient not taking: Reported on 12/06/2020    [provider]  COVID-19 mRNA Vac-TriS, Pfizer, (PFIZER-BIONT COVID-19 VAC-TRIS) SUSP injection Inject into the muscle. 11/01/20   Carlyle Basques, MD  ezetimibe (ZETIA) 10 MG tablet Take 10 mg by mouth daily. 09/06/19   [provider]  fluticasone (FLONASE) 50 MCG/ACT nasal spray Place 1 spray into both nostrils daily.     [provider]  levalbuterol Penne Lash) 1.25 MG/3ML nebulizer solution  07/01/19   [provider]  losartan-hydrochlorothiazide (HYZAAR) 100-25 MG tablet Take 0.5 tablets by mouth daily.    [provider]  Multiple Vitamins-Minerals (MULTIVITAMIN PO) Take 6 each by mouth daily.     [provider]  naproxen sodium (ALEVE) 220 MG tablet Take 220 mg by mouth daily.    [provider]  omeprazole (PRILOSEC) 20 MG capsule Take 20 mg by mouth daily.     [provider]  OXYGEN Inhale 2 L into the lungs continuous.     [provider]  tiotropium (SPIRIVA) 18 MCG inhalation capsule Place 1 capsule (18 mcg total) into inhaler and inhale daily. 02/19/18   Brand Males, MD    Allergies    Statins and Latex  Review of Systems   Review of Systems  Constitutional:  Positive for activity change, appetite change and fatigue. Negative for chills, diaphoresis and fever.  HENT:  Negative for ear pain and sore throat.   Eyes:  Negative for pain and visual disturbance.  Respiratory:  Negative for cough and shortness of breath.   Cardiovascular:  Negative for chest pain and palpitations.  Gastrointestinal:  Positive for abdominal pain. Negative for abdominal distention, diarrhea, nausea and vomiting.  Genitourinary:  Negative for dysuria, flank pain, hematuria and pelvic pain.  Musculoskeletal:  Negative  for arthralgias, back pain, joint swelling, myalgias and neck pain.  Skin:  Negative for color change and rash.  Neurological:  Positive for weakness (Generalized). Negative for dizziness, seizures, syncope, numbness and headaches.  Hematological:  Does not bruise/bleed easily.  Psychiatric/Behavioral:  Negative for confusion and decreased concentration.   All other systems reviewed and are negative.  Physical Exam Updated Vital Signs BP 99/69   Pulse 98   Temp 97.7 F (36.5 C) (Oral)   Resp 18   Ht 5\' 2"  (1.575 m)   Wt 78 kg   SpO2 100%   BMI 31.45 kg/m   Physical Exam Vitals and nursing note reviewed.  Constitutional:      General: She is not in acute distress.    Appearance: Normal appearance. She is well-developed. She is  ill-appearing. She is not toxic-appearing or diaphoretic.  HENT:     Head: Normocephalic and atraumatic.     Right Ear: External ear normal.     Left Ear: External ear normal.     Nose: Nose normal.     Mouth/Throat:     Mouth: Mucous membranes are dry.  Eyes:     Conjunctiva/sclera: Conjunctivae normal.  Cardiovascular:     Rate and Rhythm: Regular rhythm. Tachycardia present.     Heart sounds: No murmur heard. Pulmonary:     Effort: Pulmonary effort is normal. No respiratory distress.     Breath sounds: Normal breath sounds. No wheezing, rhonchi or rales.  Chest:     Chest wall: No tenderness.  Abdominal:     Palpations: Abdomen is soft.     Tenderness: There is no abdominal tenderness. There is no right CVA tenderness, left CVA tenderness or guarding.  Musculoskeletal:        General: No swelling.     Cervical back: Neck supple. No rigidity or tenderness.     Right lower leg: No edema.     Left lower leg: No edema.  Skin:    General: Skin is warm and dry.     Coloration: Skin is not jaundiced or pale.  Neurological:     General: No focal deficit present.     Mental Status: She is alert and oriented to person, place, and time.      Cranial Nerves: No cranial nerve deficit.     Sensory: No sensory deficit.     Motor: No weakness.     Coordination: Coordination normal.  Psychiatric:        Mood and Affect: Mood normal.        Behavior: Behavior normal.        Thought Content: Thought content normal.        Judgment: Judgment normal.    ED Results / Procedures / Treatments   Labs (all labs ordered are listed, but only abnormal results are displayed) Labs Reviewed  COMPREHENSIVE METABOLIC PANEL - Abnormal; Notable for the following components:      Result Value   BUN 90 (*)    Creatinine, Ser 4.33 (*)    Calcium 8.8 (*)    Total Protein 6.4 (*)    Albumin 3.1 (*)    GFR, Estimated 10 (*)    Anion gap 17 (*)    All other components within normal limits  LACTIC ACID, PLASMA - Abnormal; Notable for the following components:   Lactic Acid, Venous 2.3 (*)    All other components within normal limits  BRAIN NATRIURETIC PEPTIDE - Abnormal; Notable for the following components:   B Natriuretic Peptide 401.1 (*)    All other components within normal limits  CBC WITH DIFFERENTIAL/PLATELET - Abnormal; Notable for the following components:   WBC 16.4 (*)    Platelets 115 (*)    Neutro Abs 14.4 (*)    Lymphs Abs 0.6 (*)    Monocytes Absolute 1.1 (*)    Abs Immature Granulocytes 0.12 (*)    All other components within normal limits  I-STAT CHEM 8, ED - Abnormal; Notable for the following components:   BUN 86 (*)    Creatinine, Ser 4.50 (*)    Calcium, Ion 1.08 (*)    All other components within normal limits  TROPONIN I (HIGH SENSITIVITY) - Abnormal; Notable for the following components:   Troponin I (High Sensitivity) 26 (*)    All  other components within normal limits  TROPONIN I (HIGH SENSITIVITY) - Abnormal; Notable for the following components:   Troponin I (High Sensitivity) 26 (*)    All other components within normal limits  URINE CULTURE  CULTURE, BLOOD (ROUTINE X 2)  CULTURE, BLOOD (ROUTINE X 2)   LACTIC ACID, PLASMA  MAGNESIUM  URINALYSIS, ROUTINE W REFLEX MICROSCOPIC    EKG EKG Interpretation  Date/Time:  Friday December 28 2020 13:23:15 EDT Ventricular Rate:  113 PR Interval:  138 QRS Duration: 93 QT Interval:  322 QTC Calculation: 442 R Axis:   75 Text Interpretation: Sinus tachycardia Ventricular premature complex Borderline low voltage, extremity leads 12 Lead; [place] Confirmed by Godfrey Pick [phone]) on 12/28/2020 2:49:08 PM  Radiology CT ABDOMEN PELVIS WO CONTRAST  Result Date: 12/28/2020 CLINICAL DATA:  Flank pain EXAM: CT ABDOMEN AND PELVIS WITHOUT CONTRAST TECHNIQUE: Multidetector CT imaging of the abdomen and pelvis was performed following the standard protocol without IV contrast. COMPARISON:  CT chest, 11/30/2020.  CT Abdomen Pelvis, 01/26/2018 FINDINGS: Lower chest: Centrilobular emphysematous change of the lung bases, greater on right. Coronary calcifications Hepatobiliary: Multiple circumscribed hypodense lesions within liver, similar to comparison, and likely consistent with hepatic cysts. Interval distention of gallbladder, with radiodense gallstone. No evidence of gallbladder wall thickening. No biliary ductal dilation. Pancreas: Unremarkable. No pancreatic ductal dilatation or surrounding inflammatory changes. Spleen: Normal in size without focal abnormality. Adrenals/Urinary Tract: *2.5 cm left adrenal thickening, unchanged. *Right renal calcification is unchanged.  No right hydronephrosis. *Left perirenal stranding. 6 mm left ureteropelvic junction nephrolith, with resulting moderate hydronephrosis Stomach/Bowel: Stomach is within normal limits. Appendix appears normal. Severe sigmoid diverticulosis, without evidence of diverticulitis. Mild rectal distention of stool. No evidence of bowel wall thickening, distention, or inflammatory changes. Vascular/Lymphatic: Aortic and iliac vascular calcifications, without aneurysmal dilation. No enlarged abdominal or pelvic lymph  nodes. Reproductive: Status post hysterectomy. No adnexal masses. Other: No abdominal wall hernia or abnormality. No abdominopelvic ascites. Musculoskeletal: Multilevel degenerative change of imaged spine. No acute osseous findings. IMPRESSION: 1. 6 mm left UPJ calculus, mild with resulting moderate hydronephrosis. 2. Cholelithiasis with interval distention of gallbladder. No gallbladder wall thickening or additional findings to suggest acute cholelithiasis. 3. Additional chronic and senescent changes, as above. 4. Aortic Atherosclerosis (ICD10-I70.0) and Emphysema (ICD10-J43.9). Electronically Signed   By: Michaelle Birks MD   On: 12/28/2020 16:03    Procedures .Critical Care  Date/Time: 12/28/2020 4:27 PM Performed by: Godfrey Pick, MD Authorized by: Godfrey Pick, MD   Critical care provider statement:    Critical care time (minutes):  60   Critical care start time:  12/28/2020 2:30 PM   Critical care end time:  12/28/2020 3:30 PM   Critical care time was exclusive of:  Separately billable procedures and treating other patients and teaching time   Critical care was necessary to treat or prevent imminent or life-threatening deterioration of the following conditions:  Renal failure, sepsis and dehydration   Critical care was time spent personally by me on the following activities:  Development of treatment plan with patient or surrogate, evaluation of patient's response to treatment, examination of patient, obtaining history from patient or surrogate, ordering and performing treatments and interventions, ordering and review of laboratory studies, ordering and review of radiographic studies and re-evaluation of patient's condition   I assumed direction of critical care for this patient from another provider in my specialty: yes     Medications Ordered in ED Medications  ceFEPIme (MAXIPIME) 2 g in sodium chloride  0.9 % 100 mL IVPB (has no administration in time range)  vancomycin (VANCOREADY) IVPB 1500  mg/300 mL (has no administration in time range)  lactated ringers bolus 500 mL (500 mLs Intravenous New Bag/Given 12/28/20 1538)  lactated ringers bolus 500 mL (500 mLs Intravenous New Bag/Given 12/28/20 1538)    ED Course  I have reviewed the triage vital signs and the nursing notes.  Pertinent labs & imaging results that were available during my care of the patient were reviewed by me and considered in my medical decision making (see chart for details).    MDM Rules/Calculators/A&P                          Patient is a 75 year old female with history of COPD and nephrolithiasis, presenting for 4 days of worsening generalized weakness, decreased p.o. intake, and fatigue.  Tachycardic upon arrival.  Currently, patient is afebrile and denies any recent fevers or chills.  Lungs clear on auscultation without evidence of wheeze or decreased air movement.  Abdomen is soft and without any areas of tenderness.  She does endorse an area of pain without tenderness on the left side of her abdomen.  Of note on physical exam is extremely dry oral mucosa.  Patient states that she does have a history of heart failure, however, I do not see this in EMR.  Plan will be for gentle rehydration.  500 cc bolus of IVF was ordered.  Likely etiology includes dehydration and/or infection.  Laboratory work-up was initiated.  EKG shows sinus tachycardia without evidence of ST segment changes.  Laboratory work-up shows AKI with creatinine of 4.33.  BUN is also elevated at 90 suggestive of possible prerenal etiology.  Anion gap is elevated at 17, likely from azotemia.  Patient does not have known CKD.  Creatinine levels in the past been normal.  Additional 500 cc bolus of IV fluids ordered.  Patient has thrombocytopenia which has been present on a prior labs.  She has a new leukocytosis of 16.4.  Given her history of nephrolithiasis and current AKI, CT stone study was ordered.  Initial lactate elevated at 2.3.  Broad-spectrum  antibiotics were ordered.  CT scan shows left ureteral stone with left-sided hydronephrosis suggestive of obstructive uropathy.  On reassessment, patient denies any worsening of symptoms.  At this time, her daughter is at bedside.  At time of signout, radiology interpretation of CT scan was pending.  Care of patient was signed out to oncoming ED provider.  Final Clinical Impression(s) / ED Diagnoses Final diagnoses:  Generalized weakness  AKI (acute kidney injury) ([place])  Dehydration    Rx / DC Orders ED Discharge Orders     None        Godfrey Pick, MD 12/28/20 1629

## 2020-12-28 NOTE — ED Triage Notes (Signed)
Per EMS, patient from home, took oxycodone x3 days ago for abdominal pain. C/o weakness and decreased appetite since that time. Hx emphysema. 2L [place] at baseline.   Wheezing on EMS arrival. Breathing treatment with EMS.   20g L AC 330ml NS

## 2020-12-28 NOTE — ED Provider Notes (Signed)
Emergency Medicine Provider Triage Evaluation Note  Brandy Morales , a 74 y.o. female  was evaluated in triage.  Pt complains of progressively worsening weakness x4 days with generalized abdominal pain, decreased appetite.  Chronically on 2 L at home of supplemental oxygen by nasal cannula for emphysema.  Review of Systems  Positive: Weakness, abdominal pain, anorexia, shortness of breath at baseline Negative: Chest pain, palpitations, vomiting, diarrhea, nausea, fevers, chills  Physical Exam  BP 113/76   Pulse (!) 117   Temp 97.7 F (36.5 C) (Oral)   Resp (!) 25   Ht 5\' 2"  (1.575 m)   Wt 78 kg   SpO2 93%   BMI 31.45 kg/m  Gen:   Awake, no distress   Resp:  Normal effort  MSK:   Moves extremities without difficulty  Other:  Tachycardic to the 120s, regular rhythm.  Significantly decreased air movement throughout the lung fields bilaterally.  Tachypneic.  Abdomen soft, nondistended, nontender.  Medical Decision Making  Medically screening exam initiated at 11:55 AM.  Appropriate orders placed.  Brandy Morales was informed that the remainder of the evaluation will be completed by another provider, this initial triage assessment does not replace that evaluation, and the importance of remaining in the ED until their evaluation is complete.  This chart was dictated using voice recognition software, Dragon. Despite the best efforts of this provider to proofread and correct errors, errors may still occur which can change documentation meaning.    [name] 12/28/20 1157    Godfrey Pick, MD 12/28/20 Jeri Lager

## 2020-12-28 NOTE — Progress Notes (Signed)
A consult was received from an ED physician for Cefepime and Vancomycin per pharmacy dosing.  The patient's profile has been reviewed for ht/wt/allergies/indication/available labs.   A one time order has been placed for Cefepime 2g and Vancomycin 1500 mg .  Further antibiotics/pharmacy consults should be ordered by admitting physician if indicated.                       Thank you,  Gretta Arab PharmD, BCPS Clinical Pharmacist [facility] main pharmacy [phone] 12/28/2020 3:49 PM

## 2020-12-28 NOTE — Progress Notes (Signed)
Pharmacy Antibiotic Note  Brandy Morales is a 75 y.o. female admitted on 07/25/9189 with UTI, complicated with renal stone, ARF  Pharmacy has been consulted for Cefepime dosing.  Plan: Cefepime 2gm x1 in ED 8/5 SCr 4.3 > 4.5 Continue Cefepime at 1gm q24 Monitor SCr closely, adj Cefepime as able  Height: 5\' 2"  (157.5 cm) Weight: 78 kg (171 lb 15.3 oz) IBW/kg (Calculated) : 50.1  Temp (24hrs), Avg:97.7 F (36.5 C), Min:97.7 F (36.5 C), Max:97.7 F (36.5 C)  Recent Labs  Lab 12/28/20 1222 12/28/20 1313 12/28/20 1422  WBC 16.4*  --   --   CREATININE 4.33* 4.50*  --   LATICACIDVEN 1.6  --  2.3*    Estimated Creatinine Clearance: 10.6 mL/min (A) (by C-G formula based on SCr of 4.5 mg/dL (H)).    Allergies  Allergen Reactions   Statins Cough   Latex Other (See Comments)    Broke out on face   Antimicrobials this admission: 8/5 Vanc x1 8/5 Cefepime >>   Dose adjustments this admission:  Microbiology results: 8/5 BCx: sent 8/5 UCx: collected   Thank you for allowing pharmacy to be a part of this patient's care.  Minda Ditto PharmD 12/28/2020 6:55 PM

## 2020-12-28 NOTE — ED Provider Notes (Signed)
75 yo F with a cc of generalized weakness.  Going on for the past four days.  Patient found to have acute renal failure, urinary retention.  L sided kidney stone.  Discussed with Dr. Alyson Ingles, urology did not recommend any acute urologic intervention at this time.  Recommended Flomax and hydration.  Will discuss with hospitalist for admission.  CRITICAL CARE Performed by: Cecilio Asper   Total critical care time: 35 minutes  Critical care time was exclusive of separately billable procedures and treating other patients.  Critical care was necessary to treat or prevent imminent or life-threatening deterioration.  Critical care was time spent personally by me on the following activities: development of treatment plan with patient and/or surrogate as well as nursing, discussions with consultants, evaluation of patient's response to treatment, examination of patient, obtaining history from patient or surrogate, ordering and performing treatments and interventions, ordering and review of laboratory studies, ordering and review of radiographic studies, pulse oximetry and re-evaluation of patient's condition.    Deno Etienne, DO 12/28/20 1646

## 2020-12-28 NOTE — Progress Notes (Signed)
Elink following Sepsis bundle.

## 2020-12-28 NOTE — H&P (Signed)
History and Physical    Brandy Morales WRU:[id number] DOB: [birth year] DOA: 12/28/2020  PCP: [name], MD Patient coming from: Home.  Independent at baseline.   Chief Complaint: Generalized weakness  HPI: Brandy Morales is a 75 y.o. 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  Patient had progressive generalized weakness and left back pain over the last 4 days.  She also had associated nausea and emesis.  She had 1 episode of nonbloody and nonbilious emesis 2 days ago.  She has not had further emesis after that.  She has poor p.o. intake.  She has been trying to manage her symptoms with leftover oxycodone and Zofran from her last hospitalization for nephrolithiasis in February.  Patient sounded weak and talking out of her mind when patient's daughter called her, and her daughter decided to bring her to the hospital for evaluation.  Patient denies fever, URI symptoms, changes in her breathing, chest pain, focal neurodeficit, diarrhea, dysuria, frequency, urgency or hematuria.  She admits to pain across lower abdomen.  She says she might be constipated.  Last bowel movement was 2 days ago.   Former smoker.  Denies drinking alcohol recreational drug use.  Prefers to remain full code.  In ED, tachycardic to 110s.  Otherwise vitals within normal.  Saturating in upper 90s to 100% on 2 L.  WBC 16.4 with left shift.  Platelet 115. Cr/BUN 4.33/90 (baseline 0.82).  Troponin 26x2.  BNP 401.  Lactic acid 1.6>> 2.3.  UA with moderate Hgb and many bacteria.  CT abdomen and pelvis without contrast with 6 mm left UPJ stone, moderate hydronephrosis, cholelithiasis with interval distention of gallbladder but no gallbladder wall thickening to suggest acute cholecystitis.  Urology, Dr. Alyson Ingles consulted and recommended Flomax, indwelling Foley and IV fluid.  Blood  and urine cultures ordered.  She received a liter of LR bolus.  Started on IV cefepime and vancomycin.  Hospitalist service called for admission.  ROS All review of system negative except for pertinent positives and negatives as history of present illness above.  PMH Past Medical History:  Diagnosis Date   Anxiety    Asthma    COPD (chronic obstructive pulmonary disease) (HCC)    GERD (gastroesophageal reflux disease)    History of kidney stones    History of radiation therapy 01/05/2020   IMRT right lung 54 Gy in 3 Fractions 12/29/2019 through 01/05/2020  Dr Gery Pray   Hypertension    PSH Past Surgical History:  Procedure Laterality Date   ABDOMINAL HYSTERECTOMY  age 48   partial 1 ovary left   APPENDECTOMY     colonscopy     CYSTOSCOPY WITH RETROGRADE PYELOGRAM, URETEROSCOPY AND STENT PLACEMENT N/A 11/30/2017   Procedure: CYSTOSCOPY WITH BILATERAL RETROGRADE PYELOGRAM, LEFT URETEROSCOPY/ LEFT LASER LITHO AND LEFT STENT PLACEMENT;  Surgeon: Lucas Mallow, MD;  Location: [facility];  Service: Urology;  Laterality: N/A;   CYSTOSCOPY WITH RETROGRADE PYELOGRAM, URETEROSCOPY AND STENT PLACEMENT Left 02/01/2018   Procedure: CYSTOSCOPY WITH LEFT RETROGRADE PYELOGRAM, URETEROSCOPY HOLMIUM LASER AND STENT PLACEMENT;  Surgeon: Lucas Mallow, MD;  Location: [facility];  Service: Urology;  Laterality: Left;   EYE SURGERY Bilateral    Cataract   TONSILLECTOMY     Fam HX Does not recall family history of cholelithiasis.  Social Hx  reports that she quit smoking about 6 years ago. Her smoking  use included cigarettes. She has a 110.00 pack-year smoking history. She has never used smokeless tobacco. She reports current alcohol use of about 1.0 standard drink of alcohol per week. She reports that she does not use drugs.  Allergy Allergies  Allergen Reactions   Statins Cough   Latex Other (See Comments)    Broke out on face   Home Meds Prior to Admission medications   Medication Sig Start  Date End Date Taking? Authorizing Provider  acyclovir (ZOVIRAX) 400 MG tablet Take 400 mg by mouth 3 (three) times daily as needed (for outbreak).    [provider]  albuterol (VENTOLIN HFA) 108 (90 Base) MCG/ACT inhaler Inhale into the lungs. 10/09/20   [provider]  Artificial Tear Solution (GENTEAL TEARS OP) Place 1 drop into both eyes daily.     [provider]  atorvastatin (LIPITOR) 10 MG tablet Take 10 mg by mouth daily. 11/05/20   [provider]  BREO ELLIPTA 200-25 MCG/INH AEPB Inhale 1 puff into the lungs daily. 09/06/19   [provider]  cetirizine (ZYRTEC) 10 MG tablet Take 10 mg by mouth daily. 09/06/19   [provider]  Cholecalciferol (VITAMIN D3) 5000 units CAPS Take 5,000 Units by mouth daily.    [provider]  citalopram (CELEXA) 20 MG tablet Take 20 mg by mouth daily.    [provider]  COENZYME Q10 PO Take 10 mLs by mouth daily.  Patient not taking: Reported on 12/06/2020    [provider]  COVID-19 mRNA Vac-TriS, Pfizer, (PFIZER-BIONT COVID-19 VAC-TRIS) SUSP injection Inject into the muscle. 11/01/20   Carlyle Basques, MD  ezetimibe (ZETIA) 10 MG tablet Take 10 mg by mouth daily. 09/06/19   [provider]  fluticasone (FLONASE) 50 MCG/ACT nasal spray Place 1 spray into both nostrils daily.     [provider]  levalbuterol Penne Lash) 1.25 MG/3ML nebulizer solution  07/01/19   [provider]  losartan-hydrochlorothiazide (HYZAAR) 100-25 MG tablet Take 0.5 tablets by mouth daily.    [provider]  Multiple Vitamins-Minerals (MULTIVITAMIN PO) Take 6 each by mouth daily.     [provider]  naproxen sodium (ALEVE) 220 MG tablet Take 220 mg by mouth daily.    [provider]  omeprazole (PRILOSEC) 20 MG capsule Take 20 mg by mouth daily.     [provider]  OXYGEN Inhale 2 L into the lungs continuous.     [provider]   tamsulosin (FLOMAX) 0.4 MG CAPS capsule Take 0.4 mg by mouth daily. 07/13/20   [provider]  tiotropium (SPIRIVA) 18 MCG inhalation capsule Place 1 capsule (18 mcg total) into inhaler and inhale daily. 02/19/18   Brand Males, MD    Physical Exam: Vitals:   12/28/20 1532 12/28/20 1600 12/28/20 1630 12/28/20 1700  BP: 97/63 99/69 97/61  101/64  Pulse: (!) 105 98 96 95  Resp: 18 18 18 18   Temp:      TempSrc:      SpO2: 100% 100% 100% 100%  Weight:      Height:        GENERAL: No acute distress.  Appears well.  HEENT: MMM.  Vision and hearing grossly intact.  NECK: Supple.  No apparent JVD.  RESP: 100% on 2 L.  No IWOB. Good air movement bilaterally. CVS:  RRR. Heart sounds normal.  ABD/GI/GU: Bowel sounds present. Soft.  Tenderness across lower abdomen.  No rebound or guarding.  No CVA tenderness. MSK/EXT:  Moves extremities. No apparent deformity or edema.  SKIN: no apparent skin lesion or wound NEURO: Awake, alert and oriented appropriately.  No gross deficit.  PSYCH: Calm. Normal affect.   Personally Reviewed Radiological Exams CT ABDOMEN PELVIS WO CONTRAST  Result Date: 12/28/2020 CLINICAL DATA:  Flank pain EXAM: CT ABDOMEN AND PELVIS WITHOUT CONTRAST TECHNIQUE: Multidetector CT imaging of the abdomen and pelvis was performed following the standard protocol without IV contrast. COMPARISON:  CT chest, 11/30/2020.  CT Abdomen Pelvis, 01/26/2018 FINDINGS: Lower chest: Centrilobular emphysematous change of the lung bases, greater on right. Coronary calcifications Hepatobiliary: Multiple circumscribed hypodense lesions within liver, similar to comparison, and likely consistent with hepatic cysts. Interval distention of gallbladder, with radiodense gallstone. No evidence of gallbladder wall thickening. No biliary ductal dilation. Pancreas: Unremarkable. No pancreatic ductal dilatation or surrounding inflammatory changes. Spleen: Normal in size without focal abnormality.  Adrenals/Urinary Tract: *2.5 cm left adrenal thickening, unchanged. *Right renal calcification is unchanged.  No right hydronephrosis. *Left perirenal stranding. 6 mm left ureteropelvic junction nephrolith, with resulting moderate hydronephrosis Stomach/Bowel: Stomach is within normal limits. Appendix appears normal. Severe sigmoid diverticulosis, without evidence of diverticulitis. Mild rectal distention of stool. No evidence of bowel wall thickening, distention, or inflammatory changes. Vascular/Lymphatic: Aortic and iliac vascular calcifications, without aneurysmal dilation. No enlarged abdominal or pelvic lymph nodes. Reproductive: Status post hysterectomy. No adnexal masses. Other: No abdominal wall hernia or abnormality. No abdominopelvic ascites. Musculoskeletal: Multilevel degenerative change of imaged spine. No acute osseous findings. IMPRESSION: 1. 6 mm left UPJ calculus, mild with resulting moderate hydronephrosis. 2. Cholelithiasis with interval distention of gallbladder. No gallbladder wall thickening or additional findings to suggest acute cholelithiasis. 3. Additional chronic and senescent changes, as above. 4. Aortic Atherosclerosis (ICD10-I70.0) and Emphysema (ICD10-J43.9). Electronically Signed   By: Michaelle Birks MD   On: 12/28/2020 16:03     Personally Reviewed Labs: CBC: Recent Labs  Lab 12/28/20 1222 12/28/20 1313  WBC 16.4*  --   NEUTROABS 14.4*  --   HGB 13.6 14.3  HCT 42.2 42.0  MCV 88.8  --   PLT 115*  --    Basic Metabolic Panel: Recent Labs  Lab 12/28/20 1222 12/28/20 1313  NA 138 135  K 3.9 3.9  CL 98 99  CO2 23  --   GLUCOSE 83 78  BUN 90* 86*  CREATININE 4.33* 4.50*  CALCIUM 8.8*  --   MG 2.2  --    GFR: Estimated Creatinine Clearance: 10.6 mL/min (A) (by C-G formula based on SCr of 4.5 mg/dL (H)). Liver Function Tests: Recent Labs  Lab 12/28/20 1222  AST 29  ALT 32  ALKPHOS 80  BILITOT 1.1  PROT 6.4*  ALBUMIN 3.1*   No results for input(s):  LIPASE, AMYLASE in the last 168 hours. No results for input(s): AMMONIA in the last 168 hours. Coagulation Profile: No results for input(s): INR, PROTIME in the last 168 hours. Cardiac Enzymes: No results for input(s): CKTOTAL, CKMB, CKMBINDEX, TROPONINI in the last 168 hours. BNP (last 3 results) No results for input(s): PROBNP in the last 8760 hours. HbA1C: No results for input(s): HGBA1C in the last 72 hours. CBG: No results for input(s): GLUCAP in the last 168 hours. Lipid Profile: No results for input(s): CHOL, HDL, LDLCALC, TRIG, CHOLHDL, LDLDIRECT in the last 72 hours. Thyroid Function Tests: No results for input(s): TSH, T4TOTAL, FREET4, T3FREE, THYROIDAB in the last 72 hours. Anemia Panel: No results for input(s): VITAMINB12, FOLATE, FERRITIN, TIBC, IRON, RETICCTPCT in the  last 72 hours. Urine analysis:    Component Value Date/Time   COLORURINE YELLOW 12/28/2020 1222   APPEARANCEUR HAZY (A) 12/28/2020 1222   LABSPEC 1.011 12/28/2020 1222   PHURINE 5.0 12/28/2020 1222   GLUCOSEU NEGATIVE 12/28/2020 1222   HGBUR MODERATE (A) 12/28/2020 1222   BILIRUBINUR NEGATIVE 12/28/2020 1222   KETONESUR 5 (A) 12/28/2020 1222   PROTEINUR 100 (A) 12/28/2020 1222   NITRITE NEGATIVE 12/28/2020 1222   LEUKOCYTESUR TRACE (A) 12/28/2020 1222    Sepsis Labs:  Lactic acid 1.6>>> 2.3.  Personally Reviewed EKG:  Twelve-lead EKG with sinus tachycardia to 113 and occasional VPCs.  Assessment/Plan AKI/azotemia: B/l Cr 0.82.  I suspect this to be more of prerenal from poor p.o. intake and GI loss with concurrent use of Hyzaar and Aleve.  She also have moderate hydronephrosis from UPJ stone. Recent Labs    12/28/20 1222 12/28/20 1313  BUN 90* 86*  CREATININE 4.33* 4.50*  -Received a liter of LR bolus in ED. -Indwelling Foley catheter placed. -D5-NS at 100 cc an hour -Avoid nephrotoxic meds -Recheck renal function in the morning -Nephrology consult if no improvement or worse  Left UPJ  stone with moderate hydronephrosis-normally follows with Dr. Gloriann Loan -Per EDP, urology (Dr. Alyson Ingles) recommended Flomax, indwelling Foley and IV fluid-all ordered. -N.p.o. after midnight in case she needs surgical intervention -Continue IV cefepime for possible infection  Urinary tract infection: Has tenderness across lower abdomen.  UA with many bacteria and moderate Hgb.  Lactic acid rising.  Hemodynamically stable except for mild tachycardia.  Received IV cefepime and vancomycin in ED. -Continue IV cefepime  Nausea and vomiting: Likely due to nephrolithiasis. -Treat nephrolithiasis and infection as above -As needed Zofran and IV fluid as above  Lactic acidosis: 1.6>> 2.3.  Could be from infection, dehydration and/or delayed clearance from renal failure. -Continue trending.  Elevated BNP/troponin-patient has no cardiopulmonary symptoms.  Appears dry on exam.  No history of CHF or CAD.  Likely delayed clearance in the setting of AKI.   Chronic COPD/chronic hypoxic respiratory failure: On 2 L at baseline.  Stable. -Continue home breathing treatments with as needed DuoNeb  Essential hypertension: Normotensive.  On Hyzaar at home. -Hold Hyzaar in the setting of AKI  History of pulmonary nodule -Outpatient follow-up.  Anxiety: Stable. -Continue home Celexa  Generalized weakness: Very independent at baseline.  Able to drive himself.  She is very weak at this time. -PT/OT consult  Constipation: LBM 2 days prior to admission. -MiraLAX, Senokot-S and subset enema as needed  Goal of care counseling-had discussion about CODE STATUS, pros and cons of CPR and intubation with patient and patient's daughter at bedside.  At this time, patient prefers to remain full code.  DVT prophylaxis: Subcu heparin  Code Status: Full code Family Communication: Updated patient's daughter at bedside.  Disposition Plan: Admit to progressive bed  Consults called: Urology by EDP Admission status:  Inpatient Level of care: Progressive   Mercy Riding MD [facility]  If 7PM-7AM, please contact night-coverage [email or web address]  12/28/2020, 5:47 PM

## 2020-12-29 DIAGNOSIS — D649 Anemia, unspecified: Secondary | ICD-10-CM

## 2020-12-29 DIAGNOSIS — R7989 Other specified abnormal findings of blood chemistry: Secondary | ICD-10-CM

## 2020-12-29 DIAGNOSIS — N132 Hydronephrosis with renal and ureteral calculous obstruction: Secondary | ICD-10-CM

## 2020-12-29 DIAGNOSIS — K5900 Constipation, unspecified: Secondary | ICD-10-CM

## 2020-12-29 LAB — BLOOD CULTURE ID PANEL (REFLEXED) - BCID2

## 2020-12-29 LAB — CBC WITH DIFFERENTIAL/PLATELET
Abs Immature Granulocytes: 0.04 10*3/uL (ref 0.00–0.07)
Basophils Absolute: 0 10*3/uL (ref 0.0–0.1)
Basophils Relative: 0 %
Eosinophils Absolute: 0 10*3/uL (ref 0.0–0.5)
Eosinophils Relative: 0 %
HCT: 33.2 % — ABNORMAL LOW (ref 36.0–46.0)
Hemoglobin: 10.9 g/dL — ABNORMAL LOW (ref 12.0–15.0)
Immature Granulocytes: 1 %
Lymphocytes Relative: 6 %
Lymphs Abs: 0.5 10*3/uL — ABNORMAL LOW (ref 0.7–4.0)
MCH: 29.3 pg (ref 26.0–34.0)
MCHC: 32.8 g/dL (ref 30.0–36.0)
MCV: 89.2 fL (ref 80.0–100.0)
Monocytes Absolute: 0.8 10*3/uL (ref 0.1–1.0)
Monocytes Relative: 9 %
Neutro Abs: 7 10*3/uL (ref 1.7–7.7)
Neutrophils Relative %: 84 %
Platelets: 89 10*3/uL — ABNORMAL LOW (ref 150–400)
RBC: 3.72 MIL/uL — ABNORMAL LOW (ref 3.87–5.11)
RDW: 14 % (ref 11.5–15.5)
WBC: 8.3 10*3/uL (ref 4.0–10.5)
nRBC: 0 % (ref 0.0–0.2)

## 2020-12-29 LAB — RENAL FUNCTION PANEL
Albumin: 2.3 g/dL — ABNORMAL LOW (ref 3.5–5.0)
Anion gap: 12 (ref 5–15)
BUN: 81 mg/dL — ABNORMAL HIGH (ref 8–23)
CO2: 22 mmol/L (ref 22–32)
Calcium: 7.8 mg/dL — ABNORMAL LOW (ref 8.9–10.3)
Chloride: 103 mmol/L (ref 98–111)
Creatinine, Ser: 3.03 mg/dL — ABNORMAL HIGH (ref 0.44–1.00)
GFR, Estimated: 16 mL/min — ABNORMAL LOW (ref >60)
Glucose, Bld: 154 mg/dL — ABNORMAL HIGH (ref 70–99)
Phosphorus: 4.1 mg/dL (ref 2.5–4.6)
Potassium: 3.5 mmol/L (ref 3.5–5.1)
Sodium: 137 mmol/L (ref 135–145)

## 2020-12-29 LAB — MAGNESIUM: Magnesium: 2 mg/dL (ref 1.7–2.4)

## 2020-12-29 LAB — CK: Total CK: 94 U/L (ref 38–234)

## 2020-12-29 LAB — LACTIC ACID, PLASMA: Lactic Acid, Venous: 0.6 mmol/L (ref 0.5–1.9)

## 2020-12-29 MED ORDER — BISACODYL 5 MG PO TBEC
10.0000 mg | DELAYED_RELEASE_TABLET | Freq: Once | ORAL | Status: AC
  Administered 2020-12-29: 10 mg via ORAL
  Filled 2020-12-29: qty 2

## 2020-12-29 MED ORDER — SODIUM CHLORIDE 0.9 % IV SOLN
2.0000 g | INTRAVENOUS | Status: DC
  Administered 2020-12-29: 2 g via INTRAVENOUS
  Filled 2020-12-29: qty 2

## 2020-12-29 MED ORDER — SODIUM CHLORIDE 0.9 % IV SOLN
2.0000 g | INTRAVENOUS | Status: AC
  Administered 2020-12-30 – 2021-01-01 (×3): 2 g via INTRAVENOUS
  Filled 2020-12-29 (×3): qty 2

## 2020-12-29 MED ORDER — CHLORHEXIDINE GLUCONATE CLOTH 2 % EX PADS
6.0000 | MEDICATED_PAD | Freq: Every day | CUTANEOUS | Status: DC
  Administered 2020-12-29 – 2020-12-31 (×3): 6 via TOPICAL

## 2020-12-29 MED ORDER — SENNOSIDES-DOCUSATE SODIUM 8.6-50 MG PO TABS
1.0000 | ORAL_TABLET | Freq: Two times a day (BID) | ORAL | Status: DC
  Administered 2020-12-29: 1 via ORAL
  Filled 2020-12-29: qty 1

## 2020-12-29 MED ORDER — ORAL CARE MOUTH RINSE
15.0000 mL | Freq: Two times a day (BID) | OROMUCOSAL | Status: DC
  Administered 2020-12-29 – 2021-01-03 (×9): 15 mL via OROMUCOSAL

## 2020-12-29 MED ORDER — POLYETHYLENE GLYCOL 3350 17 G PO PACK
17.0000 g | PACK | Freq: Two times a day (BID) | ORAL | Status: DC
  Administered 2020-12-29 – 2021-01-02 (×9): 17 g via ORAL
  Filled 2020-12-29 (×10): qty 1

## 2020-12-29 NOTE — Progress Notes (Signed)
PROGRESS NOTE  Brandy Morales RSW:[id number] DOB: 09-28-1945   PCP: [name], MD  Patient is from: Home.  Very independent at baseline.  DOA: 12/28/2020 LOS: 1  Chief complaints:  Chief Complaint  Patient presents with   Weakness     Brief Narrative / Interim history: 75 y.o. 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  She had Foley placed.  Started on BSA, Flomax and IV fluid, and admitted.  Subjective: Seen and examined earlier this morning.  No major events overnight of this morning.  Feels better but still very weak.  She denies chest pain, dyspnea, nausea, vomiting or abdominal pain.  Still with some left flank pain.  Denies dysuria, frequency, urgency or hematuria.  She has not had a bowel movement yet.  Objective: Vitals:   12/29/20 0445 12/29/20 1300 12/29/20 1400 12/29/20 1407  BP:  104/60 106/70   Pulse: 93 86 86   Resp:  16 18   Temp:    (!) 97.4 F (36.3 C)  TempSrc:    Oral  SpO2: 94% 100% 100%   Weight:      Height:       No intake or output data in the 24 hours ending 12/29/20 1455 Filed Weights   12/28/20 1115  Weight: 78 kg    Examination:  GENERAL: No apparent distress.  Very weak needing significant assistance to sit in bed for exam. HEENT: MMM.  Vision and hearing grossly intact.  NECK: Supple.  No apparent JVD.  RESP: 93 to 95% on RA.  No IWOB.  Fair aeration bilaterally. CVS:  RRR. Heart sounds normal.  ABD/GI/GU: BS+. Abd soft.  Some discomfort with palpation across lower abdomen.  No rebound or guarding.  No CVA tenderness. MSK/EXT:  Moves extremities. No apparent deformity. No edema.  SKIN: no apparent skin  lesion or wound NEURO: Awake, alert and oriented appropriately.  No apparent focal neuro deficit. PSYCH: Calm. Normal affect.   Procedures:  None  Microbiology summarized: [id number] and influenza PCR nonreactive. Blood cultures NGTD. Urine cultures pending.  Assessment & Plan: AKI/azotemia: B/l Cr 0.82.  I suspect this to be more of prerenal from poor p.o. intake and GI loss with concurrent use of Hyzaar and Aleve.  She also have moderate hydronephrosis from UPJ stone.  CK within normal.  Improving. Recent Labs    12/28/20 1222 12/28/20 1313 12/29/20 0657  BUN 90* 86* 81*  CREATININE 4.33* 4.50* 3.03*  -Continue indwelling Foley catheter and IV fluid. -Avoid nephrotoxic meds -Recheck renal function in the morning   Left UPJ stone with moderate hydronephrosis-normally follows with Dr. Gloriann Loan -Per EDP, urology (Dr. Alyson Ingles) recommended Flomax, indwelling Foley and IV fluid-all ordered. -Continue IV cefepime and follow urine culture -Will touch base with urology for further recommendation   Urinary tract infection: Hard suprapubic tenderness and concerning UA with many bacteria and moderate Hgb.  Lactic acidosis and tachycardia resolved.   -IV vancomycin in ED -Continue IV cefepime -Follow urine culture   Nausea and vomiting: Likely due to nephrolithiasis, azotemia and possible UTI.  Resolved. -Treat AKI, nephrolithiasis and UTI as above. -IV Zofran as needed -Continue IV fluid  Normocytic anemia: About 2.5 g drop in Hgb likely dilutional from IV  fluid.  She was dehydrated on arrival.  She denies melena or hematochezia.  She has not had a bowel movement in 3 days. Recent Labs    12/28/20 1222 12/28/20 1313 12/29/20 0657  HGB 13.6 14.3 10.9*  -Continue monitoring.  Lactic acidosis: 1.6>> 2.3>> 0.6.  Resolved.   Elevated BNP/troponin-patient has no cardiopulmonary symptoms.  Appears dry on exam.  No history of CHF or CAD.  Likely delayed clearance in the setting of AKI.     Chronic COPD/chronic hypoxic respiratory failure: On 2 L at baseline.  Saturation ranges from 92 to 96% on RA at rest. -We discussed the importance of minimum oxygen to keep saturation above 88% -Continue home breathing treatments with as needed DuoNeb   Essential hypertension: Normotensive.  On Hyzaar at home. 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 -Continue home Celexa   Generalized weakness: Very independent at baseline.  Able to drive himself.  She is very weak at this time. -PT/OT consulted.   Constipation: LBM 2 days prior to admission. -Scheduled MiraLAX and Senokot-S until she has a good BM   Goal of care counseling-full code with full scope of care full code -See discussion from H&P  Acute thrombocytopenia: Baseline about 170s.  Likely due to gram-negative infection Recent Labs  Lab 12/28/20 1222 12/29/20 0657  PLT 115* 89*  -Continue monitoring. -Discontinue subcu heparin.  SCD for VTE prophylaxis   Body mass index is 31.45 kg/m.         DVT prophylaxis:  Place and maintain sequential compression device Start: 12/29/20 0930  Code Status: Full code Family Communication: Patient and/or RN.  Updated patient's daughter at bedside Level of care: Telemetry Status is: Inpatient  Remains inpatient appropriate because:IV treatments appropriate due to intensity of illness or inability to take PO and Inpatient level of care appropriate due to severity of illness  Dispo: The patient is from: Home              Anticipated d/c is to:  To be determined              Patient currently is not medically stable to d/c.   Difficult to place patient No       Consultants:  Urology by EDP   Sch Meds:  Scheduled Meds:  atorvastatin  5 mg Oral Once per day on Mon Wed Fri   citalopram  20 mg Oral Daily   ezetimibe  10 mg Oral QHS    fluticasone  1 spray Each Nare Daily   fluticasone furoate-vilanterol  1 puff Inhalation QPM   loratadine  10 mg Oral Daily   pantoprazole  40 mg Oral Daily   polyethylene glycol  17 g Oral BID   polyvinyl alcohol   Both Eyes Daily   senna-docusate  1 tablet Oral BID   tamsulosin  0.8 mg Oral QPC supper   umeclidinium bromide  1 puff Inhalation Daily   Continuous Infusions:  ceFEPime (MAXIPIME) IV     dextrose 5 % and 0.9% NaCl 100 mL/hr at 12/29/20 0445   PRN Meds:.acetaminophen **OR** acetaminophen, fentaNYL (SUBLIMAZE) injection, fentaNYL (SUBLIMAZE) injection, hydrALAZINE, ipratropium-albuterol, ondansetron **OR** ondansetron (ZOFRAN) IV  Antimicrobials: Anti-infectives (From admission, onward)    Start     Dose/Rate Route Frequency Ordered Stop   12/29/20 1600  ceFEPIme (MAXIPIME) 2 g in sodium chloride 0.9 % 100 mL  IVPB  Status:  Discontinued        2 g 200 mL/hr over 30 Minutes Intravenous Every 24 hours 12/28/20 1852 12/28/20 1854   12/29/20 1600  ceFEPIme (MAXIPIME) 1 g in sodium chloride 0.9 % 100 mL IVPB  Status:  Discontinued        1 g 200 mL/hr over 30 Minutes Intravenous Every 24 hours 12/28/20 1854 12/29/20 1131   12/29/20 1600  ceFEPIme (MAXIPIME) 2 g in sodium chloride 0.9 % 100 mL IVPB        2 g 200 mL/hr over 30 Minutes Intravenous Every 24 hours 12/29/20 1131     12/28/20 1600  vancomycin (VANCOREADY) IVPB 1500 mg/300 mL        1,500 mg 150 mL/hr over 120 Minutes Intravenous  Once 12/28/20 1550 12/28/20 2015   12/28/20 1545  ceFEPIme (MAXIPIME) 2 g in sodium chloride 0.9 % 100 mL IVPB        2 g 200 mL/hr over 30 Minutes Intravenous  Once 12/28/20 1531 12/28/20 1739   12/28/20 1545  vancomycin (VANCOCIN) IVPB 1000 mg/200 mL premix  Status:  Discontinued        1,000 mg 200 mL/hr over 60 Minutes Intravenous  Once 12/28/20 1531 12/28/20 1550   12/28/20 1200  piperacillin-tazobactam (ZOSYN) IVPB 3.375 g  Status:  Discontinued        3.375 g 12.5 mL/hr  over 240 Minutes Intravenous  Once 12/28/20 1159 12/28/20 1207        I have personally reviewed the following labs and images: CBC: Recent Labs  Lab 12/28/20 1222 12/28/20 1313 12/29/20 0657  WBC 16.4*  --  8.3  NEUTROABS 14.4*  --  7.0  HGB 13.6 14.3 10.9*  HCT 42.2 42.0 33.2*  MCV 88.8  --  89.2  PLT 115*  --  89*   BMP &GFR Recent Labs  Lab 12/28/20 1222 12/28/20 1313 12/29/20 0657  NA 138 135 137  K 3.9 3.9 3.5  CL 98 99 103  CO2 23  --  22  GLUCOSE 83 78 154*  BUN 90* 86* 81*  CREATININE 4.33* 4.50* 3.03*  CALCIUM 8.8*  --  7.8*  MG 2.2  --  2.0  PHOS  --   --  4.1   Estimated Creatinine Clearance: 15.8 mL/min (A) (by C-G formula based on SCr of 3.03 mg/dL (H)). Liver & Pancreas: Recent Labs  Lab 12/28/20 1222 12/29/20 0657  AST 29  --   ALT 32  --   ALKPHOS 80  --   BILITOT 1.1  --   PROT 6.4*  --   ALBUMIN 3.1* 2.3*   No results for input(s): LIPASE, AMYLASE in the last 168 hours. No results for input(s): AMMONIA in the last 168 hours. Diabetic: No results for input(s): HGBA1C in the last 72 hours. No results for input(s): GLUCAP in the last 168 hours. Cardiac Enzymes: Recent Labs  Lab 12/29/20 0657  CKTOTAL 94   No results for input(s): PROBNP in the last 8760 hours. Coagulation Profile: No results for input(s): INR, PROTIME in the last 168 hours. Thyroid Function Tests: No results for input(s): TSH, T4TOTAL, FREET4, T3FREE, THYROIDAB in the last 72 hours. Lipid Profile: No results for input(s): CHOL, HDL, LDLCALC, TRIG, CHOLHDL, LDLDIRECT in the last 72 hours. Anemia Panel: No results for input(s): VITAMINB12, FOLATE, FERRITIN, TIBC, IRON, RETICCTPCT in the last 72 hours. Urine analysis:    Component Value Date/Time   COLORURINE YELLOW 12/28/2020 1222  APPEARANCEUR HAZY (A) 12/28/2020 1222   LABSPEC 1.011 12/28/2020 1222   PHURINE 5.0 12/28/2020 1222   GLUCOSEU NEGATIVE 12/28/2020 1222   HGBUR MODERATE (A) 12/28/2020 1222    BILIRUBINUR NEGATIVE 12/28/2020 1222   KETONESUR 5 (A) 12/28/2020 1222   PROTEINUR 100 (A) 12/28/2020 1222   NITRITE NEGATIVE 12/28/2020 1222   LEUKOCYTESUR TRACE (A) 12/28/2020 1222   Sepsis Labs: Invalid input(s): PROCALCITONIN, [name]  Microbiology: Recent Results (from the past 240 hour(s))  Blood culture (routine x 2)     Status: None (Preliminary result)   Collection Time: 12/28/20  3:37 PM   Specimen: BLOOD LEFT ARM  Result Value Ref Range Status   Specimen Description BLOOD LEFT ARM  Final   Special Requests   Final    BOTTLES DRAWN AEROBIC AND ANAEROBIC Blood Culture adequate volume   Culture   Final    NO GROWTH < 24 HOURS Performed at [facility], [place] [address], [place], [place] [address]    Report Status PENDING  Incomplete  Blood culture (routine x 2)     Status: None (Preliminary result)   Collection Time: 12/28/20  3:42 PM   Specimen: BLOOD  Result Value Ref Range Status   Specimen Description BLOOD LEFT ANTECUBITAL  Final   Special Requests   Final    BOTTLES DRAWN AEROBIC AND ANAEROBIC Blood Culture adequate volume   Culture   Final    NO GROWTH < 24 HOURS Performed at [facility], [place] [address], [place], [place] [address]    Report Status PENDING  Incomplete  Resp Panel by RT-PCR (Flu A&B, Covid) Nasopharyngeal Swab     Status: None   Collection Time: 12/28/20  7:19 PM   Specimen: Nasopharyngeal Swab; Nasopharyngeal(NP) swabs in vial transport medium  Result Value Ref Range Status   SARS Coronavirus 2 by RT PCR NEGATIVE NEGATIVE Final    Comment: (NOTE) SARS-CoV-2 target nucleic acids are NOT DETECTED.  The SARS-CoV-2 RNA is generally detectable in upper respiratory specimens during the acute phase of infection. The lowest concentration of SARS-CoV-2 viral copies this assay can detect is 138 copies/mL. A negative result does not preclude SARS-Cov-2 infection and should not be used as the sole basis for treatment or other patient  management decisions. A negative result may occur with  improper specimen collection/handling, submission of specimen other than nasopharyngeal swab, presence of viral mutation(s) within the areas targeted by this assay, and inadequate number of viral copies(<138 copies/mL). A negative result must be combined with clinical observations, patient history, and epidemiological information. The expected result is Negative.  Fact Sheet for Patients:  EntrepreneurPulse.com.au  Fact Sheet for Healthcare Providers:  IncredibleEmployment.be  This test is no t yet approved or cleared by the [place] FDA and  has been authorized for detection and/or diagnosis of SARS-CoV-2 by FDA under an Emergency Use Authorization (EUA). This EUA will remain  in effect (meaning this test can be used) for the duration of the COVID-19 declaration under Section 564(b)(1) of the Act, 21 U.S.C.section 360bbb-3(b)(1), unless the authorization is terminated  or revoked sooner.       Influenza A by PCR NEGATIVE NEGATIVE Final   Influenza B by PCR NEGATIVE NEGATIVE Final    Comment: (NOTE) The Xpert Xpress SARS-CoV-2/FLU/RSV plus assay is intended as an aid in the diagnosis of influenza from Nasopharyngeal swab specimens and should not be used as a sole basis for treatment. Nasal washings and aspirates are unacceptable for Xpert  Xpress SARS-CoV-2/FLU/RSV testing.  Fact Sheet for Patients: EntrepreneurPulse.com.au  Fact Sheet for Healthcare Providers: IncredibleEmployment.be  This test is not yet approved or cleared by the [place] FDA and has been authorized for detection and/or diagnosis of SARS-CoV-2 by FDA under an Emergency Use Authorization (EUA). This EUA will remain in effect (meaning this test can be used) for the duration of the COVID-19 declaration under Section 564(b)(1) of the Act, 21 U.S.C. section 360bbb-3(b)(1),  unless the authorization is terminated or revoked.  Performed at [facility], [place] [address] [place], [place] [address]     Radiology Studies: CT ABDOMEN PELVIS WO CONTRAST  Result Date: 12/28/2020 CLINICAL DATA:  Flank pain EXAM: CT ABDOMEN AND PELVIS WITHOUT CONTRAST TECHNIQUE: Multidetector CT imaging of the abdomen and pelvis was performed following the standard protocol without IV contrast. COMPARISON:  CT chest, 11/30/2020.  CT Abdomen Pelvis, 01/26/2018 FINDINGS: Lower chest: Centrilobular emphysematous change of the lung bases, greater on right. Coronary calcifications Hepatobiliary: Multiple circumscribed hypodense lesions within liver, similar to comparison, and likely consistent with hepatic cysts. Interval distention of gallbladder, with radiodense gallstone. No evidence of gallbladder wall thickening. No biliary ductal dilation. Pancreas: Unremarkable. No pancreatic ductal dilatation or surrounding inflammatory changes. Spleen: Normal in size without focal abnormality. Adrenals/Urinary Tract: *2.5 cm left adrenal thickening, unchanged. *Right renal calcification is unchanged.  No right hydronephrosis. *Left perirenal stranding. 6 mm left ureteropelvic junction nephrolith, with resulting moderate hydronephrosis Stomach/Bowel: Stomach is within normal limits. Appendix appears normal. Severe sigmoid diverticulosis, without evidence of diverticulitis. Mild rectal distention of stool. No evidence of bowel wall thickening, distention, or inflammatory changes. Vascular/Lymphatic: Aortic and iliac vascular calcifications, without aneurysmal dilation. No enlarged abdominal or pelvic lymph nodes. Reproductive: Status post hysterectomy. No adnexal masses. Other: No abdominal wall hernia or abnormality. No abdominopelvic ascites. Musculoskeletal: Multilevel degenerative change of imaged spine. No acute osseous findings. IMPRESSION: 1. 6 mm left UPJ calculus, mild with resulting  moderate hydronephrosis. 2. Cholelithiasis with interval distention of gallbladder. No gallbladder wall thickening or additional findings to suggest acute cholelithiasis. 3. Additional chronic and senescent changes, as above. 4. Aortic Atherosclerosis (ICD10-I70.0) and Emphysema (ICD10-J43.9). Electronically Signed   By: Michaelle Birks MD   On: 12/28/2020 16:03      Tamre Cass T. [place]  If 7PM-7AM, please contact night-coverage [email or web address] 12/29/2020, 2:55 PM

## 2020-12-29 NOTE — Progress Notes (Signed)
PHARMACY - PHYSICIAN COMMUNICATION CRITICAL VALUE ALERT - BLOOD CULTURE IDENTIFICATION (BCID)  Brandy Morales is an 75 y.o. female who presented to [facility] on 12/28/2020 with a chief complaint of generalized weakness and left back pain. Abdominal CT showed left UPJ calculus and hydronephrosis.  She's currently on cefepime for sepsis/UTI.  One of four blood culture bottles collected on 8/5 now has GNR (BCID= Ecoli).  Name of physician (or Provider) Contacted: Dr. Cyndia Skeeters  Current antibiotics: cefepime  Changes to prescribed antibiotics recommended:  - change abx to ceftriaxone 2 gm IV q24h  Results for orders placed or performed during the hospital encounter of 12/28/20  Blood Culture ID Panel (Reflexed) (Collected: 12/28/2020  3:37 PM)  Result Value Ref Range   Enterococcus faecalis NOT DETECTED NOT DETECTED   Enterococcus Faecium NOT DETECTED NOT DETECTED   Listeria monocytogenes NOT DETECTED NOT DETECTED   Staphylococcus species NOT DETECTED NOT DETECTED   Staphylococcus aureus (BCID) NOT DETECTED NOT DETECTED   Staphylococcus epidermidis NOT DETECTED NOT DETECTED   Staphylococcus lugdunensis NOT DETECTED NOT DETECTED   Streptococcus species NOT DETECTED NOT DETECTED   Streptococcus agalactiae NOT DETECTED NOT DETECTED   Streptococcus pneumoniae NOT DETECTED NOT DETECTED   Streptococcus pyogenes NOT DETECTED NOT DETECTED   A.calcoaceticus-baumannii NOT DETECTED NOT DETECTED   Bacteroides fragilis NOT DETECTED NOT DETECTED   Enterobacterales DETECTED (A) NOT DETECTED   Enterobacter cloacae complex NOT DETECTED NOT DETECTED   Escherichia coli DETECTED (A) NOT DETECTED   Klebsiella aerogenes NOT DETECTED NOT DETECTED   Klebsiella oxytoca NOT DETECTED NOT DETECTED   Klebsiella pneumoniae NOT DETECTED NOT DETECTED   Proteus species NOT DETECTED NOT DETECTED   Salmonella species NOT DETECTED NOT DETECTED   Serratia marcescens NOT DETECTED NOT DETECTED   Haemophilus influenzae NOT  DETECTED NOT DETECTED   Neisseria meningitidis NOT DETECTED NOT DETECTED   Pseudomonas aeruginosa NOT DETECTED NOT DETECTED   Stenotrophomonas maltophilia NOT DETECTED NOT DETECTED   Candida albicans NOT DETECTED NOT DETECTED   Candida auris NOT DETECTED NOT DETECTED   Candida glabrata NOT DETECTED NOT DETECTED   Candida krusei NOT DETECTED NOT DETECTED   Candida parapsilosis NOT DETECTED NOT DETECTED   Candida tropicalis NOT DETECTED NOT DETECTED   Cryptococcus neoformans/gattii NOT DETECTED NOT DETECTED   CTX-M ESBL NOT DETECTED NOT DETECTED   Carbapenem resistance IMP NOT DETECTED NOT DETECTED   Carbapenem resistance KPC NOT DETECTED NOT DETECTED   Carbapenem resistance NDM NOT DETECTED NOT DETECTED   Carbapenem resist OXA 48 LIKE NOT DETECTED NOT DETECTED   Carbapenem resistance VIM NOT DETECTED NOT DETECTED    Lynelle Doctor 12/29/2020  4:47 PM

## 2020-12-29 NOTE — Consult Note (Signed)
Urology Consult  Referring physician: Dr. Cyndia Skeeters Reason for referral: left ureteral calculus  Chief Complaint: Left flank pain  History of Present Illness: Brandy Morales is a 75yo who presented to the ER with a 5 day history of left abdominal pain and poor PO intake. She underwent CT in the Er and was foud to have a left proximal 4-43mm ureteral calculus with mild to moderate hydronephrosis. She has had over 15 stone events previously. Her flank pain is sharp, intermittent, mild and nonradiating. Pain is currently controlled with pain medication. She denies any LUTS. No hematuria. Creatinine was 4.0 yesterday and has improved to 3.0. She is afebrile  Past Medical History:  Diagnosis Date   Anxiety    Asthma    COPD (chronic obstructive pulmonary disease) (HCC)    GERD (gastroesophageal reflux disease)    History of kidney stones    History of radiation therapy 01/05/2020   IMRT right lung 54 Gy in 3 Fractions 12/29/2019 through 01/05/2020  Dr Gery Pray   Hypertension    Past Surgical History:  Procedure Laterality Date   ABDOMINAL HYSTERECTOMY  age 69   partial 1 ovary left   APPENDECTOMY     colonscopy     CYSTOSCOPY WITH RETROGRADE PYELOGRAM, URETEROSCOPY AND STENT PLACEMENT N/A 11/30/2017   Procedure: CYSTOSCOPY WITH BILATERAL RETROGRADE PYELOGRAM, LEFT URETEROSCOPY/ LEFT LASER LITHO AND LEFT STENT PLACEMENT;  Surgeon: Lucas Mallow, MD;  Location: [facility];  Service: Urology;  Laterality: N/A;   CYSTOSCOPY WITH RETROGRADE PYELOGRAM, URETEROSCOPY AND STENT PLACEMENT Left 02/01/2018   Procedure: CYSTOSCOPY WITH LEFT RETROGRADE PYELOGRAM, URETEROSCOPY HOLMIUM LASER AND STENT PLACEMENT;  Surgeon: Lucas Mallow, MD;  Location: [facility];  Service: Urology;  Laterality: Left;   EYE SURGERY Bilateral    Cataract   TONSILLECTOMY      Medications: I have reviewed the patient's current medications. Allergies:  Allergies  Allergen Reactions   Statins Cough   Latex Other (See Comments)     Broke out on face    History reviewed. No pertinent family history. Social History:  reports that she quit smoking about 6 years ago. Her smoking use included cigarettes. She has a 110.00 pack-year smoking history. She has never used smokeless tobacco. She reports current alcohol use of about 1.0 standard drink of alcohol per week. She reports that she does not use drugs.  Review of Systems  Genitourinary:  Positive for flank pain.  All other systems reviewed and are negative.  Physical Exam:  Vital signs in last 24 hours: Temp:  [97.4 F (36.3 C)-98 F (36.7 C)] 98 F (36.7 C) (08/06 1538) Pulse Rate:  [86-103] 90 (08/06 1538) Resp:  [16-18] 18 (08/06 1538) BP: (82-114)/(52-93) 106/65 (08/06 1538) SpO2:  [85 %-100 %] 98 % (08/06 1538) Weight:  [79.9 kg] 79.9 kg (08/06 1537) Physical Exam Vitals reviewed.  Constitutional:      Appearance: Normal appearance.  HENT:     Head: Normocephalic and atraumatic.     Nose: Nose normal.  Eyes:     Extraocular Movements: Extraocular movements intact.     Pupils: Pupils are equal, round, and reactive to light.  Cardiovascular:     Rate and Rhythm: Normal rate and regular rhythm.  Pulmonary:     Effort: Pulmonary effort is normal. No respiratory distress.  Abdominal:     General: Abdomen is flat. There is no distension.  Musculoskeletal:        General: No swelling. Normal range of motion.  Cervical back: Normal range of motion and neck supple.  Skin:    General: Skin is warm and dry.  Neurological:     General: No focal deficit present.     Mental Status: She is alert and oriented to person, place, and time.  Psychiatric:        Mood and Affect: Mood normal.        Behavior: Behavior normal.        Thought Content: Thought content normal.        Judgment: Judgment normal.    Laboratory Data:  Results for orders placed or performed during the hospital encounter of 12/28/20 (from the past 72 hour(s))  Comprehensive  metabolic panel     Status: Abnormal   Collection Time: 12/28/20 12:22 PM  Result Value Ref Range   Sodium 138 135 - 145 mmol/L   Potassium 3.9 3.5 - 5.1 mmol/L   Chloride 98 98 - 111 mmol/L   CO2 23 22 - 32 mmol/L   Glucose, Bld 83 70 - 99 mg/dL    Comment: Glucose reference range applies only to samples taken after fasting for at least 8 hours.   BUN 90 (H) 8 - 23 mg/dL   Creatinine, Ser 4.33 (H) 0.44 - 1.00 mg/dL   Calcium 8.8 (L) 8.9 - 10.3 mg/dL   Total Protein 6.4 (L) 6.5 - 8.1 g/dL   Albumin 3.1 (L) 3.5 - 5.0 g/dL   AST 29 15 - 41 U/L   ALT 32 0 - 44 U/L   Alkaline Phosphatase 80 38 - 126 U/L   Total Bilirubin 1.1 0.3 - 1.2 mg/dL   GFR, Estimated 10 (L) >60 mL/min    Comment: (NOTE) Calculated using the CKD-EPI Creatinine Equation (2021)    Anion gap 17 (H) 5 - 15    Comment: Performed at [facility], [place] [address], [place], [place] [address]  Troponin I (High Sensitivity)     Status: Abnormal   Collection Time: 12/28/20 12:22 PM  Result Value Ref Range   Troponin I (High Sensitivity) 26 (H) <18 ng/L    Comment: (NOTE) Elevated high sensitivity troponin I (hsTnI) values and significant  changes across serial measurements may suggest ACS but many other  chronic and acute conditions are known to elevate hsTnI results.  Refer to the "Links" section for chest pain algorithms and additional  guidance. Performed at [facility], [place] [address] [place], [place] [address]   Lactic acid, plasma     Status: None   Collection Time: 12/28/20 12:22 PM  Result Value Ref Range   Lactic Acid, Venous 1.6 0.5 - 1.9 mmol/L    Comment: Performed at [facility], [place] [address], [place], [place] [address]  Brain natriuretic peptide     Status: Abnormal   Collection Time: 12/28/20 12:22 PM  Result Value Ref Range   B Natriuretic Peptide 401.1 (H) 0.0 - 100.0 pg/mL    Comment: Performed at [facility], [place] [address] [place], [place] [address]  Magnesium     Status: None   Collection Time: 12/28/20 12:22 PM  Result Value Ref Range   Magnesium 2.2 1.7 - 2.4 mg/dL    Comment: Performed at [facility], [place] [address], [place], [place] [address]  CBC with Differential     Status: Abnormal   Collection Time: 12/28/20 12:22 PM  Result Value Ref Range   WBC 16.4 (H) 4.0 - 10.5 K/uL  RBC 4.75 3.87 - 5.11 MIL/uL   Hemoglobin 13.6 12.0 - 15.0 g/dL   HCT 42.2 36.0 - 46.0 %   MCV 88.8 80.0 - 100.0 fL   MCH 28.6 26.0 - 34.0 pg   MCHC 32.2 30.0 - 36.0 g/dL   RDW 13.9 11.5 - 15.5 %   Platelets 115 (L) 150 - 400 K/uL    Comment: Immature Platelet Fraction may be clinically indicated, consider ordering this additional test [id number]    nRBC 0.0 0.0 - 0.2 %   Neutrophils Relative % 88 %   Neutro Abs 14.4 (H) 1.7 - 7.7 K/uL   Lymphocytes Relative 4 %   Lymphs Abs 0.6 (L) 0.7 - 4.0 K/uL   Monocytes Relative 6 %   Monocytes Absolute 1.1 (H) 0.1 - 1.0 K/uL   Eosinophils Relative 1 %   Eosinophils Absolute 0.2 0.0 - 0.5 K/uL   Basophils Relative 0 %   Basophils Absolute 0.1 0.0 - 0.1 K/uL   Immature Granulocytes 1 %   Abs Immature Granulocytes 0.12 (H) 0.00 - 0.07 K/uL    Comment: Performed at [facility], [place] [address], [place], [place] [address]  Urinalysis, Routine w reflex microscopic Urine, Clean Catch     Status: Abnormal   Collection Time: 12/28/20 12:22 PM  Result Value Ref Range   Color, Urine YELLOW YELLOW   APPearance HAZY (A) CLEAR   Specific Gravity, Urine 1.011 1.005 - 1.030   pH 5.0 5.0 - 8.0   Glucose, UA NEGATIVE NEGATIVE mg/dL   Hgb urine dipstick MODERATE (A) NEGATIVE   Bilirubin Urine NEGATIVE NEGATIVE   Ketones, ur 5 (A) NEGATIVE mg/dL   Protein, ur 100 (A) NEGATIVE mg/dL   Nitrite NEGATIVE NEGATIVE   Leukocytes,Ua TRACE (A) NEGATIVE   RBC / HPF 0-5 0 - 5 RBC/hpf   WBC, UA 0-5 0 - 5 WBC/hpf   Bacteria, UA MANY (A) NONE SEEN     Comment: Performed at [facility], [place] [address], [place],  [address]  I-stat chem 8, ED (not at [facility] or [facility])     Status: Abnormal   Collection Time: 12/28/20  1:13 PM  Result Value Ref Range   Sodium 135 135 - 145 mmol/L   Potassium 3.9 3.5 - 5.1 mmol/L   Chloride 99 98 - 111 mmol/L   BUN 86 (H) 8 - 23 mg/dL   Creatinine, Ser 4.50 (H) 0.44 - 1.00 mg/dL   Glucose, Bld 78 70 - 99 mg/dL    Comment: Glucose reference range applies only to samples taken after fasting for at least 8 hours.   Calcium, Ion 1.08 (L) 1.15 - 1.40 mmol/L   TCO2 26 22 - 32 mmol/L   Hemoglobin 14.3 12.0 - 15.0 g/dL   HCT 42.0 36.0 - 46.0 %  Lactic acid, plasma     Status: Abnormal   Collection Time: 12/28/20  2:22 PM  Result Value Ref Range   Lactic Acid, Venous 2.3 (HH) 0.5 - 1.9 mmol/L    Comment: CRITICAL RESULT CALLED TO, READ BACK BY AND VERIFIED WITH: J.LOWDERMILK, RN AT 1525 ON 08.05.22 BY N.THOMPSON Performed at [facility], [place] [address], [place], [place] [address]   Troponin I (High Sensitivity)     Status: Abnormal   Collection Time: 12/28/20  2:22 PM  Result Value Ref Range   Troponin I (High Sensitivity) 26 (H) <18 ng/L    Comment: (NOTE) Elevated high sensitivity troponin I (hsTnI) values  and significant  changes across serial measurements may suggest ACS but many other  chronic and acute conditions are known to elevate hsTnI results.  Refer to the "Links" section for chest pain algorithms and additional  guidance. Performed at [facility], [place] [address], [place], [place] [address]   Blood culture (routine x 2)     Status: None (Preliminary result)   Collection Time: 12/28/20  3:37 PM   Specimen: BLOOD LEFT ARM  Result Value Ref Range   Specimen Description BLOOD LEFT ARM    Special Requests      BOTTLES DRAWN AEROBIC AND ANAEROBIC Blood Culture adequate volume   Culture  Setup Time      GRAM NEGATIVE RODS ANAEROBIC  BOTTLE ONLY Organism ID to follow    Culture      NO GROWTH < 24 HOURS Performed at [facility], [place] [address] [place], [place] [address]    Report Status PENDING   Blood culture (routine x 2)     Status: None (Preliminary result)   Collection Time: 12/28/20  3:42 PM   Specimen: BLOOD  Result Value Ref Range   Specimen Description BLOOD LEFT ANTECUBITAL    Special Requests      BOTTLES DRAWN AEROBIC AND ANAEROBIC Blood Culture adequate volume   Culture      NO GROWTH < 24 HOURS Performed at [facility], [place] [address] [place], [place] [address]    Report Status PENDING   Resp Panel by RT-PCR (Flu A&B, Covid) Nasopharyngeal Swab     Status: None   Collection Time: 12/28/20  7:19 PM   Specimen: Nasopharyngeal Swab; Nasopharyngeal(NP) swabs in vial transport medium  Result Value Ref Range   SARS Coronavirus 2 by RT PCR NEGATIVE NEGATIVE    Comment: (NOTE) SARS-CoV-2 target nucleic acids are NOT DETECTED.  The SARS-CoV-2 RNA is generally detectable in upper respiratory specimens during the acute phase of infection. The lowest concentration of SARS-CoV-2 viral copies this assay can detect is 138 copies/mL. A negative result does not preclude SARS-Cov-2 infection and should not be used as the sole basis for treatment or other patient management decisions. A negative result may occur with  improper specimen collection/handling, submission of specimen other than nasopharyngeal swab, presence of viral mutation(s) within the areas targeted by this assay, and inadequate number of viral copies(<138 copies/mL). A negative result must be combined with clinical observations, patient history, and epidemiological information. The expected result is Negative.  Fact Sheet for Patients:  EntrepreneurPulse.com.au  Fact Sheet for Healthcare Providers:  IncredibleEmployment.be  This test is no t yet approved or cleared by the [place]  FDA and  has been authorized for detection and/or diagnosis of SARS-CoV-2 by FDA under an Emergency Use Authorization (EUA). This EUA will remain  in effect (meaning this test can be used) for the duration of the COVID-19 declaration under Section 564(b)(1) of the Act, 21 U.S.C.section 360bbb-3(b)(1), unless the authorization is terminated  or revoked sooner.       Influenza A by PCR NEGATIVE NEGATIVE   Influenza B by PCR NEGATIVE NEGATIVE    Comment: (NOTE) The Xpert Xpress SARS-CoV-2/FLU/RSV plus assay is intended as an aid in the diagnosis of influenza from Nasopharyngeal swab specimens and should not be used as a sole basis for treatment. Nasal washings and aspirates are unacceptable for Xpert Xpress SARS-CoV-2/FLU/RSV testing.  Fact Sheet for Patients: EntrepreneurPulse.com.au  Fact Sheet for Healthcare Providers: IncredibleEmployment.be  This test is  not yet approved or cleared by the [place] and has been authorized for detection and/or diagnosis of SARS-CoV-2 by FDA under an Emergency Use Authorization (EUA). This EUA will remain in effect (meaning this test can be used) for the duration of the COVID-19 declaration under Section 564(b)(1) of the Act, 21 U.S.C. section 360bbb-3(b)(1), unless the authorization is terminated or revoked.  Performed at [facility], [place] [address] [place], [place] [address]   Renal function panel     Status: Abnormal   Collection Time: 12/29/20  6:57 AM  Result Value Ref Range   Sodium 137 135 - 145 mmol/L   Potassium 3.5 3.5 - 5.1 mmol/L   Chloride 103 98 - 111 mmol/L   CO2 22 22 - 32 mmol/L   Glucose, Bld 154 (H) 70 - 99 mg/dL    Comment: Glucose reference range applies only to samples taken after fasting for at least 8 hours.   BUN 81 (H) 8 - 23 mg/dL   Creatinine, Ser 3.03 (H) 0.44 - 1.00 mg/dL   Calcium 7.8 (L) 8.9 - 10.3 mg/dL   Phosphorus 4.1 2.5 - 4.6 mg/dL    Albumin 2.3 (L) 3.5 - 5.0 g/dL   GFR, Estimated 16 (L) >60 mL/min    Comment: (NOTE) Calculated using the CKD-EPI Creatinine Equation (2021)    Anion gap 12 5 - 15    Comment: Performed at [facility], [place] [address], [place], [place] [address]  Magnesium     Status: None   Collection Time: 12/29/20  6:57 AM  Result Value Ref Range   Magnesium 2.0 1.7 - 2.4 mg/dL    Comment: Performed at [facility], [place] [address] [place], [place] [address]  CBC with Differential/Platelet     Status: Abnormal   Collection Time: 12/29/20  6:57 AM  Result Value Ref Range   WBC 8.3 4.0 - 10.5 K/uL   RBC 3.72 (L) 3.87 - 5.11 MIL/uL   Hemoglobin 10.9 (L) 12.0 - 15.0 g/dL   HCT 33.2 (L) 36.0 - 46.0 %   MCV 89.2 80.0 - 100.0 fL   MCH 29.3 26.0 - 34.0 pg   MCHC 32.8 30.0 - 36.0 g/dL   RDW 14.0 11.5 - 15.5 %   Platelets 89 (L) 150 - 400 K/uL    Comment: Immature Platelet Fraction may be clinically indicated, consider ordering this additional test [id number]    nRBC 0.0 0.0 - 0.2 %   Neutrophils Relative % 84 %   Neutro Abs 7.0 1.7 - 7.7 K/uL   Lymphocytes Relative 6 %   Lymphs Abs 0.5 (L) 0.7 - 4.0 K/uL   Monocytes Relative 9 %   Monocytes Absolute 0.8 0.1 - 1.0 K/uL   Eosinophils Relative 0 %   Eosinophils Absolute 0.0 0.0 - 0.5 K/uL   Basophils Relative 0 %   Basophils Absolute 0.0 0.0 - 0.1 K/uL   Immature Granulocytes 1 %   Abs Immature Granulocytes 0.04 0.00 - 0.07 K/uL    Comment: Performed at [facility], [place] [address], [place], [place] [address]  CK     Status: None   Collection Time: 12/29/20  6:57 AM  Result Value Ref Range   Total CK 94 38 - 234 U/L    Comment: Performed at [facility], [place] [address] [place], [place] [address]  Lactic acid, plasma     Status: None   Collection Time: 12/29/20 10:33 AM  Result Value Ref Range   Lactic Acid, Venous 0.6 0.5 - 1.9 mmol/L    Comment: Performed at [facility], [place] [address], [place], [place] [address]   Recent Results (from the past 240 hour(s))  Blood culture (routine x 2)     Status: None (Preliminary result)   Collection Time: 12/28/20  3:37 PM   Specimen: BLOOD LEFT ARM  Result Value Ref Range Status   Specimen Description BLOOD LEFT ARM  Final   Special Requests   Final    BOTTLES DRAWN AEROBIC AND ANAEROBIC Blood Culture adequate volume   Culture  Setup Time   Final    GRAM NEGATIVE RODS ANAEROBIC BOTTLE ONLY Organism ID to follow    Culture   Final    NO GROWTH < 24 HOURS Performed at [facility], [place] [address] [place], [place] [address]    Report Status PENDING  Incomplete  Blood culture (routine x 2)     Status: None (Preliminary result)   Collection Time: 12/28/20  3:42 PM   Specimen: BLOOD  Result Value Ref Range Status   Specimen Description BLOOD LEFT ANTECUBITAL  Final   Special Requests   Final    BOTTLES DRAWN AEROBIC AND ANAEROBIC Blood Culture adequate volume   Culture   Final    NO GROWTH < 24 HOURS Performed at [facility], [place] [address] [place], [place] [address]    Report Status PENDING  Incomplete  Resp Panel by RT-PCR (Flu A&B, Covid) Nasopharyngeal Swab     Status: None   Collection Time: 12/28/20  7:19 PM   Specimen: Nasopharyngeal Swab; Nasopharyngeal(NP) swabs in vial transport medium  Result Value Ref Range Status   SARS Coronavirus 2 by RT PCR NEGATIVE NEGATIVE Final    Comment: (NOTE) SARS-CoV-2 target nucleic acids are NOT DETECTED.  The SARS-CoV-2 RNA is generally detectable in upper respiratory specimens during the acute phase of infection. The lowest concentration of SARS-CoV-2 viral copies this assay can detect is 138 copies/mL. A negative result does not preclude SARS-Cov-2 infection and should not be used as the sole basis for treatment or other patient management decisions. A negative result may occur with  improper specimen  collection/handling, submission of specimen other than nasopharyngeal swab, presence of viral mutation(s) within the areas targeted by this assay, and inadequate number of viral copies(<138 copies/mL). A negative result must be combined with clinical observations, patient history, and epidemiological information. The expected result is Negative.  Fact Sheet for Patients:  EntrepreneurPulse.com.au  Fact Sheet for Healthcare Providers:  IncredibleEmployment.be  This test is no t yet approved or cleared by the [place] FDA and  has been authorized for detection and/or diagnosis of SARS-CoV-2 by FDA under an Emergency Use Authorization (EUA). This EUA will remain  in effect (meaning this test can be used) for the duration of the COVID-19 declaration under Section 564(b)(1) of the Act, 21 U.S.C.section 360bbb-3(b)(1), unless the authorization is terminated  or revoked sooner.       Influenza A by PCR NEGATIVE NEGATIVE Final   Influenza B by PCR NEGATIVE NEGATIVE Final    Comment: (NOTE) The Xpert Xpress SARS-CoV-2/FLU/RSV plus assay is intended as an aid in the diagnosis of influenza from Nasopharyngeal swab specimens and should not be used as a sole basis for treatment. Nasal washings and aspirates are unacceptable for Xpert Xpress SARS-CoV-2/FLU/RSV testing.  Fact Sheet for Patients: EntrepreneurPulse.com.au  Fact Sheet for Healthcare Providers: IncredibleEmployment.be  This test is not yet approved or cleared by the [place] and has been authorized for detection and/or diagnosis of SARS-CoV-2 by FDA under an Emergency Use Authorization (EUA). This EUA will remain in effect (meaning this test can be used) for the duration of the COVID-19 declaration under Section 564(b)(1) of the Act, 21 U.S.C. section 360bbb-3(b)(1), unless the authorization is terminated or revoked.  Performed at [facility], [place] [address], [place], [place] [address]    Creatinine: Recent Labs    12/28/20 1222 12/28/20 1313 12/29/20 0657  CREATININE 4.33* 4.50* 3.03*   Baseline Creatinine: 1  Impression/Assessment:  74yo with left ureteral calculus, ARF  Plan:  -We discussed the management of kidney stones. These options include observation, ureteroscopy, shockwave lithotripsy (ESWL) and percutaneous nephrolithotomy (PCNL). We discussed which options are relevant to the patient's stone(s). We discussed the natural history of kidney stones as well as the complications of untreated stones and the impact on quality of life without treatment as well as with each of the above listed treatments. We also discussed the efficacy of each treatment in its ability to clear the stone burden. With any of these management options I discussed the signs and symptoms of infection and the need for emergent treatment should these be experienced. For each option we discussed the ability of each procedure to clear the patient of their stone burden.   For observation I described the risks which include but are not limited to silent renal damage, life-threatening infection, need for emergent surgery, failure to pass stone and pain.   For ureteroscopy I described the risks which include bleeding, infection, damage to contiguous structures, positioning injury, ureteral stricture, ureteral avulsion, ureteral injury, need for prolonged ureteral stent, inability to perform ureteroscopy, need for an interval procedure, inability to clear stone burden, stent discomfort/pain, heart attack, stroke, pulmonary embolus and the inherent risks with general anesthesia.   For shockwave lithotripsy I described the risks which include arrhythmia, kidney contusion, kidney hemorrhage, need for transfusion, pain, inability to adequately break up stone, inability to pass stone fragments, Steinstrasse, infection associated with  obstructing stones, need for alternate surgical procedure, need for repeat shockwave lithotripsy, MI, CVA, PE and the inherent risks with anesthesia/conscious sedation.   For PCNL I described the risks including positioning injury, pneumothorax, hydrothorax, need for chest tube, inability to clear stone burden, renal laceration, arterial venous fistula or malformation, need for embolization of kidney, loss of kidney or renal function, need for repeat procedure, need for prolonged nephrostomy tube, ureteral avulsion, MI, CVA, PE and the inherent risks of general anesthesia.   - The patient would like to proceed with Medical expulsive therapy. We will start flomax. If her creatinine fails to improve of she develops fever we will proceed with left ureteral stent placement  Nicolette Bang 12/29/2020, 4:32 PM

## 2020-12-29 NOTE — Progress Notes (Signed)
Pharmacy Antibiotic Note  Brandy Morales is a 75 y.o. female presented to the ED on 12/28/2020 with c/o generalized weakness and back pain.  Abdominal CT showed left UPJ calculus and hydronephrosis.  She was started on cefepime for sepsis/UTI on admission.  Today, 12/29/2020: - Day #2 abx - afeb, wbc wnl - scr down 3.03 (crcl~16) - blood cultures have been negative thus far  Plan: - adjust cefepime to 2gm IV q24h for renal function - f/u renal function closely, clinical status and culture results  _____________________________  Height: 5\' 2"  (157.5 cm) Weight: 78 kg (171 lb 15.3 oz) IBW/kg (Calculated) : 50.1  No data recorded.  Recent Labs  Lab 12/28/20 1222 12/28/20 1313 12/28/20 1422 12/29/20 0657 12/29/20 1033  WBC 16.4*  --   --  8.3  --   CREATININE 4.33* 4.50*  --  3.03*  --   LATICACIDVEN 1.6  --  2.3*  --  0.6    Estimated Creatinine Clearance: 15.8 mL/min (A) (by C-G formula based on SCr of 3.03 mg/dL (H)).    Allergies  Allergen Reactions   Statins Cough   Latex Other (See Comments)    Broke out on face     Thank you for allowing pharmacy to be a part of this patient's care.  Lynelle Doctor 12/29/2020 11:26 AM

## 2020-12-29 NOTE — H&P (View-Only) (Signed)
Urology Consult  Referring physician: Dr. Cyndia Skeeters Reason for referral: left ureteral calculus  Chief Complaint: Left flank pain  History of Present Illness: Brandy Morales is a 75yo who presented to the ER with a 5 day history of left abdominal pain and poor PO intake. She underwent CT in the Er and was foud to have a left proximal 4-38mm ureteral calculus with mild to moderate hydronephrosis. She has had over 15 stone events previously. Her flank pain is sharp, intermittent, mild and nonradiating. Pain is currently controlled with pain medication. She denies any LUTS. No hematuria. Creatinine was 4.0 yesterday and has improved to 3.0. She is afebrile  Past Medical History:  Diagnosis Date   Anxiety    Asthma    COPD (chronic obstructive pulmonary disease) (HCC)    GERD (gastroesophageal reflux disease)    History of kidney stones    History of radiation therapy 01/05/2020   IMRT right lung 54 Gy in 3 Fractions 12/29/2019 through 01/05/2020  Dr Gery Pray   Hypertension    Past Surgical History:  Procedure Laterality Date   ABDOMINAL HYSTERECTOMY  age 85   partial 1 ovary left   APPENDECTOMY     colonscopy     CYSTOSCOPY WITH RETROGRADE PYELOGRAM, URETEROSCOPY AND STENT PLACEMENT N/A 11/30/2017   Procedure: CYSTOSCOPY WITH BILATERAL RETROGRADE PYELOGRAM, LEFT URETEROSCOPY/ LEFT LASER LITHO AND LEFT STENT PLACEMENT;  Surgeon: Lucas Mallow, MD;  Location: [facility];  Service: Urology;  Laterality: N/A;   CYSTOSCOPY WITH RETROGRADE PYELOGRAM, URETEROSCOPY AND STENT PLACEMENT Left 02/01/2018   Procedure: CYSTOSCOPY WITH LEFT RETROGRADE PYELOGRAM, URETEROSCOPY HOLMIUM LASER AND STENT PLACEMENT;  Surgeon: Lucas Mallow, MD;  Location: [facility];  Service: Urology;  Laterality: Left;   EYE SURGERY Bilateral    Cataract   TONSILLECTOMY      Medications: I have reviewed the patient's current medications. Allergies:  Allergies  Allergen Reactions   Statins Cough   Latex Other (See Comments)     Broke out on face    History reviewed. No pertinent family history. Social History:  reports that she quit smoking about 6 years ago. Her smoking use included cigarettes. She has a 110.00 pack-year smoking history. She has never used smokeless tobacco. She reports current alcohol use of about 1.0 standard drink of alcohol per week. She reports that she does not use drugs.  Review of Systems  Genitourinary:  Positive for flank pain.  All other systems reviewed and are negative.  Physical Exam:  Vital signs in last 24 hours: Temp:  [97.4 F (36.3 C)-98 F (36.7 C)] 98 F (36.7 C) (08/06 1538) Pulse Rate:  [86-103] 90 (08/06 1538) Resp:  [16-18] 18 (08/06 1538) BP: (82-114)/(52-93) 106/65 (08/06 1538) SpO2:  [85 %-100 %] 98 % (08/06 1538) Weight:  [79.9 kg] 79.9 kg (08/06 1537) Physical Exam Vitals reviewed.  Constitutional:      Appearance: Normal appearance.  HENT:     Head: Normocephalic and atraumatic.     Nose: Nose normal.  Eyes:     Extraocular Movements: Extraocular movements intact.     Pupils: Pupils are equal, round, and reactive to light.  Cardiovascular:     Rate and Rhythm: Normal rate and regular rhythm.  Pulmonary:     Effort: Pulmonary effort is normal. No respiratory distress.  Abdominal:     General: Abdomen is flat. There is no distension.  Musculoskeletal:        General: No swelling. Normal range of motion.  Cervical back: Normal range of motion and neck supple.  Skin:    General: Skin is warm and dry.  Neurological:     General: No focal deficit present.     Mental Status: She is alert and oriented to person, place, and time.  Psychiatric:        Mood and Affect: Mood normal.        Behavior: Behavior normal.        Thought Content: Thought content normal.        Judgment: Judgment normal.    Laboratory Data:  Results for orders placed or performed during the hospital encounter of 12/28/20 (from the past 72 hour(s))  Comprehensive  metabolic panel     Status: Abnormal   Collection Time: 12/28/20 12:22 PM  Result Value Ref Range   Sodium 138 135 - 145 mmol/L   Potassium 3.9 3.5 - 5.1 mmol/L   Chloride 98 98 - 111 mmol/L   CO2 23 22 - 32 mmol/L   Glucose, Bld 83 70 - 99 mg/dL    Comment: Glucose reference range applies only to samples taken after fasting for at least 8 hours.   BUN 90 (H) 8 - 23 mg/dL   Creatinine, Ser 4.33 (H) 0.44 - 1.00 mg/dL   Calcium 8.8 (L) 8.9 - 10.3 mg/dL   Total Protein 6.4 (L) 6.5 - 8.1 g/dL   Albumin 3.1 (L) 3.5 - 5.0 g/dL   AST 29 15 - 41 U/L   ALT 32 0 - 44 U/L   Alkaline Phosphatase 80 38 - 126 U/L   Total Bilirubin 1.1 0.3 - 1.2 mg/dL   GFR, Estimated 10 (L) >60 mL/min    Comment: (NOTE) Calculated using the CKD-EPI Creatinine Equation (2021)    Anion gap 17 (H) 5 - 15    Comment: Performed at [facility], [place] [address], [place], [place] [address]  Troponin I (High Sensitivity)     Status: Abnormal   Collection Time: 12/28/20 12:22 PM  Result Value Ref Range   Troponin I (High Sensitivity) 26 (H) <18 ng/L    Comment: (NOTE) Elevated high sensitivity troponin I (hsTnI) values and significant  changes across serial measurements may suggest ACS but many other  chronic and acute conditions are known to elevate hsTnI results.  Refer to the "Links" section for chest pain algorithms and additional  guidance. Performed at [facility], [place] [address] [place], [place] [address]   Lactic acid, plasma     Status: None   Collection Time: 12/28/20 12:22 PM  Result Value Ref Range   Lactic Acid, Venous 1.6 0.5 - 1.9 mmol/L    Comment: Performed at [facility], [place] [address] [place], [place] [address]  Brain natriuretic peptide     Status: Abnormal   Collection Time: 12/28/20 12:22 PM  Result Value Ref Range   B Natriuretic Peptide 401.1 (H) 0.0 - 100.0 pg/mL    Comment: Performed at [facility], [place] [address] [place], [place] [address]  Magnesium     Status: None   Collection Time: 12/28/20 12:22 PM  Result Value Ref Range   Magnesium 2.2 1.7 - 2.4 mg/dL    Comment: Performed at [facility], [place] [address] [place],  [address]  CBC with Differential     Status: Abnormal   Collection Time: 12/28/20 12:22 PM  Result Value Ref Range   WBC 16.4 (H) 4.0 - 10.5 K/uL  RBC 4.75 3.87 - 5.11 MIL/uL   Hemoglobin 13.6 12.0 - 15.0 g/dL   HCT 42.2 36.0 - 46.0 %   MCV 88.8 80.0 - 100.0 fL   MCH 28.6 26.0 - 34.0 pg   MCHC 32.2 30.0 - 36.0 g/dL   RDW 13.9 11.5 - 15.5 %   Platelets 115 (L) 150 - 400 K/uL    Comment: Immature Platelet Fraction may be clinically indicated, consider ordering this additional test [id number]    nRBC 0.0 0.0 - 0.2 %   Neutrophils Relative % 88 %   Neutro Abs 14.4 (H) 1.7 - 7.7 K/uL   Lymphocytes Relative 4 %   Lymphs Abs 0.6 (L) 0.7 - 4.0 K/uL   Monocytes Relative 6 %   Monocytes Absolute 1.1 (H) 0.1 - 1.0 K/uL   Eosinophils Relative 1 %   Eosinophils Absolute 0.2 0.0 - 0.5 K/uL   Basophils Relative 0 %   Basophils Absolute 0.1 0.0 - 0.1 K/uL   Immature Granulocytes 1 %   Abs Immature Granulocytes 0.12 (H) 0.00 - 0.07 K/uL    Comment: Performed at [facility], [place] [address], [place], [place] [address]  Urinalysis, Routine w reflex microscopic Urine, Clean Catch     Status: Abnormal   Collection Time: 12/28/20 12:22 PM  Result Value Ref Range   Color, Urine YELLOW YELLOW   APPearance HAZY (A) CLEAR   Specific Gravity, Urine 1.011 1.005 - 1.030   pH 5.0 5.0 - 8.0   Glucose, UA NEGATIVE NEGATIVE mg/dL   Hgb urine dipstick MODERATE (A) NEGATIVE   Bilirubin Urine NEGATIVE NEGATIVE   Ketones, ur 5 (A) NEGATIVE mg/dL   Protein, ur 100 (A) NEGATIVE mg/dL   Nitrite NEGATIVE NEGATIVE   Leukocytes,Ua TRACE (A) NEGATIVE   RBC / HPF 0-5 0 - 5 RBC/hpf   WBC, UA 0-5 0 - 5 WBC/hpf   Bacteria, UA MANY (A) NONE SEEN     Comment: Performed at [facility], [place] [address], [place], [place] [address]  I-stat chem 8, ED (not at [facility] or [facility])     Status: Abnormal   Collection Time: 12/28/20  1:13 PM  Result Value Ref Range   Sodium 135 135 - 145 mmol/L   Potassium 3.9 3.5 - 5.1 mmol/L   Chloride 99 98 - 111 mmol/L   BUN 86 (H) 8 - 23 mg/dL   Creatinine, Ser 4.50 (H) 0.44 - 1.00 mg/dL   Glucose, Bld 78 70 - 99 mg/dL    Comment: Glucose reference range applies only to samples taken after fasting for at least 8 hours.   Calcium, Ion 1.08 (L) 1.15 - 1.40 mmol/L   TCO2 26 22 - 32 mmol/L   Hemoglobin 14.3 12.0 - 15.0 g/dL   HCT 42.0 36.0 - 46.0 %  Lactic acid, plasma     Status: Abnormal   Collection Time: 12/28/20  2:22 PM  Result Value Ref Range   Lactic Acid, Venous 2.3 (HH) 0.5 - 1.9 mmol/L    Comment: CRITICAL RESULT CALLED TO, READ BACK BY AND VERIFIED WITH: J.LOWDERMILK, RN AT 1525 ON 08.05.22 BY N.THOMPSON Performed at [facility], [place] [address], [place], [place] [address]   Troponin I (High Sensitivity)     Status: Abnormal   Collection Time: 12/28/20  2:22 PM  Result Value Ref Range   Troponin I (High Sensitivity) 26 (H) <18 ng/L    Comment: (NOTE) Elevated high sensitivity troponin I (hsTnI) values  and significant  changes across serial measurements may suggest ACS but many other  chronic and acute conditions are known to elevate hsTnI results.  Refer to the "Links" section for chest pain algorithms and additional  guidance. Performed at [facility], [place] [address], [place], [place] [address]   Blood culture (routine x 2)     Status: None (Preliminary result)   Collection Time: 12/28/20  3:37 PM   Specimen: BLOOD LEFT ARM  Result Value Ref Range   Specimen Description BLOOD LEFT ARM    Special Requests      BOTTLES DRAWN AEROBIC AND ANAEROBIC Blood Culture adequate volume   Culture  Setup Time      GRAM NEGATIVE RODS ANAEROBIC  BOTTLE ONLY Organism ID to follow    Culture      NO GROWTH < 24 HOURS Performed at [facility], [place] [address], [place], [place] [address]    Report Status PENDING   Blood culture (routine x 2)     Status: None (Preliminary result)   Collection Time: 12/28/20  3:42 PM   Specimen: BLOOD  Result Value Ref Range   Specimen Description BLOOD LEFT ANTECUBITAL    Special Requests      BOTTLES DRAWN AEROBIC AND ANAEROBIC Blood Culture adequate volume   Culture      NO GROWTH < 24 HOURS Performed at [facility], [place] [address] [place], [place] [address]    Report Status PENDING   Resp Panel by RT-PCR (Flu A&B, Covid) Nasopharyngeal Swab     Status: None   Collection Time: 12/28/20  7:19 PM   Specimen: Nasopharyngeal Swab; Nasopharyngeal(NP) swabs in vial transport medium  Result Value Ref Range   SARS Coronavirus 2 by RT PCR NEGATIVE NEGATIVE    Comment: (NOTE) SARS-CoV-2 target nucleic acids are NOT DETECTED.  The SARS-CoV-2 RNA is generally detectable in upper respiratory specimens during the acute phase of infection. The lowest concentration of SARS-CoV-2 viral copies this assay can detect is 138 copies/mL. A negative result does not preclude SARS-Cov-2 infection and should not be used as the sole basis for treatment or other patient management decisions. A negative result may occur with  improper specimen collection/handling, submission of specimen other than nasopharyngeal swab, presence of viral mutation(s) within the areas targeted by this assay, and inadequate number of viral copies(<138 copies/mL). A negative result must be combined with clinical observations, patient history, and epidemiological information. The expected result is Negative.  Fact Sheet for Patients:  EntrepreneurPulse.com.au  Fact Sheet for Healthcare Providers:  IncredibleEmployment.be  This test is no t yet approved or cleared by the [place]  FDA and  has been authorized for detection and/or diagnosis of SARS-CoV-2 by FDA under an Emergency Use Authorization (EUA). This EUA will remain  in effect (meaning this test can be used) for the duration of the COVID-19 declaration under Section 564(b)(1) of the Act, 21 U.S.C.section 360bbb-3(b)(1), unless the authorization is terminated  or revoked sooner.       Influenza A by PCR NEGATIVE NEGATIVE   Influenza B by PCR NEGATIVE NEGATIVE    Comment: (NOTE) The Xpert Xpress SARS-CoV-2/FLU/RSV plus assay is intended as an aid in the diagnosis of influenza from Nasopharyngeal swab specimens and should not be used as a sole basis for treatment. Nasal washings and aspirates are unacceptable for Xpert Xpress SARS-CoV-2/FLU/RSV testing.  Fact Sheet for Patients: EntrepreneurPulse.com.au  Fact Sheet for Healthcare Providers: IncredibleEmployment.be  This test is  not yet approved or cleared by the [place] and has been authorized for detection and/or diagnosis of SARS-CoV-2 by FDA under an Emergency Use Authorization (EUA). This EUA will remain in effect (meaning this test can be used) for the duration of the COVID-19 declaration under Section 564(b)(1) of the Act, 21 U.S.C. section 360bbb-3(b)(1), unless the authorization is terminated or revoked.  Performed at [facility], [place] [address] [place], [place] [address]   Renal function panel     Status: Abnormal   Collection Time: 12/29/20  6:57 AM  Result Value Ref Range   Sodium 137 135 - 145 mmol/L   Potassium 3.5 3.5 - 5.1 mmol/L   Chloride 103 98 - 111 mmol/L   CO2 22 22 - 32 mmol/L   Glucose, Bld 154 (H) 70 - 99 mg/dL    Comment: Glucose reference range applies only to samples taken after fasting for at least 8 hours.   BUN 81 (H) 8 - 23 mg/dL   Creatinine, Ser 3.03 (H) 0.44 - 1.00 mg/dL   Calcium 7.8 (L) 8.9 - 10.3 mg/dL   Phosphorus 4.1 2.5 - 4.6 mg/dL    Albumin 2.3 (L) 3.5 - 5.0 g/dL   GFR, Estimated 16 (L) >60 mL/min    Comment: (NOTE) Calculated using the CKD-EPI Creatinine Equation (2021)    Anion gap 12 5 - 15    Comment: Performed at [facility], [place] [address] [place], [place] [address]  Magnesium     Status: None   Collection Time: 12/29/20  6:57 AM  Result Value Ref Range   Magnesium 2.0 1.7 - 2.4 mg/dL    Comment: Performed at [facility], [place] [address] [place], [place] [address]  CBC with Differential/Platelet     Status: Abnormal   Collection Time: 12/29/20  6:57 AM  Result Value Ref Range   WBC 8.3 4.0 - 10.5 K/uL   RBC 3.72 (L) 3.87 - 5.11 MIL/uL   Hemoglobin 10.9 (L) 12.0 - 15.0 g/dL   HCT 33.2 (L) 36.0 - 46.0 %   MCV 89.2 80.0 - 100.0 fL   MCH 29.3 26.0 - 34.0 pg   MCHC 32.8 30.0 - 36.0 g/dL   RDW 14.0 11.5 - 15.5 %   Platelets 89 (L) 150 - 400 K/uL    Comment: Immature Platelet Fraction may be clinically indicated, consider ordering this additional test [id number]    nRBC 0.0 0.0 - 0.2 %   Neutrophils Relative % 84 %   Neutro Abs 7.0 1.7 - 7.7 K/uL   Lymphocytes Relative 6 %   Lymphs Abs 0.5 (L) 0.7 - 4.0 K/uL   Monocytes Relative 9 %   Monocytes Absolute 0.8 0.1 - 1.0 K/uL   Eosinophils Relative 0 %   Eosinophils Absolute 0.0 0.0 - 0.5 K/uL   Basophils Relative 0 %   Basophils Absolute 0.0 0.0 - 0.1 K/uL   Immature Granulocytes 1 %   Abs Immature Granulocytes 0.04 0.00 - 0.07 K/uL    Comment: Performed at [facility], [place] [address] [place],  [address]  CK     Status: None   Collection Time: 12/29/20  6:57 AM  Result Value Ref Range   Total CK 94 38 - 234 U/L    Comment: Performed at [facility], [place] [address], [place], [place] [address]  Lactic acid, plasma     Status: None   Collection Time: 12/29/20 10:33 AM  Result Value Ref Range   Lactic Acid, Venous 0.6 0.5 - 1.9 mmol/L    Comment: Performed at [facility], [place] [address] [place], [place] [address]   Recent Results (from the past 240 hour(s))  Blood culture (routine x 2)     Status: None (Preliminary result)   Collection Time: 12/28/20  3:37 PM   Specimen: BLOOD LEFT ARM  Result Value Ref Range Status   Specimen Description BLOOD LEFT ARM  Final   Special Requests   Final    BOTTLES DRAWN AEROBIC AND ANAEROBIC Blood Culture adequate volume   Culture  Setup Time   Final    GRAM NEGATIVE RODS ANAEROBIC BOTTLE ONLY Organism ID to follow    Culture   Final    NO GROWTH < 24 HOURS Performed at [facility], [place] [address] [place], [place] [address]    Report Status PENDING  Incomplete  Blood culture (routine x 2)     Status: None (Preliminary result)   Collection Time: 12/28/20  3:42 PM   Specimen: BLOOD  Result Value Ref Range Status   Specimen Description BLOOD LEFT ANTECUBITAL  Final   Special Requests   Final    BOTTLES DRAWN AEROBIC AND ANAEROBIC Blood Culture adequate volume   Culture   Final    NO GROWTH < 24 HOURS Performed at [facility], [place] [address] [place], [place] [address]    Report Status PENDING  Incomplete  Resp Panel by RT-PCR (Flu A&B, Covid) Nasopharyngeal Swab     Status: None   Collection Time: 12/28/20  7:19 PM   Specimen: Nasopharyngeal Swab; Nasopharyngeal(NP) swabs in vial transport medium  Result Value Ref Range Status   SARS Coronavirus 2 by RT PCR NEGATIVE NEGATIVE Final    Comment: (NOTE) SARS-CoV-2 target nucleic acids are NOT DETECTED.  The SARS-CoV-2 RNA is generally detectable in upper respiratory specimens during the acute phase of infection. The lowest concentration of SARS-CoV-2 viral copies this assay can detect is 138 copies/mL. A negative result does not preclude SARS-Cov-2 infection and should not be used as the sole basis for treatment or other patient management decisions. A negative result may occur with  improper specimen  collection/handling, submission of specimen other than nasopharyngeal swab, presence of viral mutation(s) within the areas targeted by this assay, and inadequate number of viral copies(<138 copies/mL). A negative result must be combined with clinical observations, patient history, and epidemiological information. The expected result is Negative.  Fact Sheet for Patients:  EntrepreneurPulse.com.au  Fact Sheet for Healthcare Providers:  IncredibleEmployment.be  This test is no t yet approved or cleared by the [place] FDA and  has been authorized for detection and/or diagnosis of SARS-CoV-2 by FDA under an Emergency Use Authorization (EUA). This EUA will remain  in effect (meaning this test can be used) for the duration of the COVID-19 declaration under Section 564(b)(1) of the Act, 21 U.S.C.section 360bbb-3(b)(1), unless the authorization is terminated  or revoked sooner.       Influenza A by PCR NEGATIVE NEGATIVE Final   Influenza B by PCR NEGATIVE NEGATIVE Final    Comment: (NOTE) The Xpert Xpress SARS-CoV-2/FLU/RSV plus assay is intended as an aid in the diagnosis of influenza from Nasopharyngeal swab specimens and should not be used as a sole basis for treatment. Nasal washings and aspirates are unacceptable for Xpert Xpress SARS-CoV-2/FLU/RSV testing.  Fact Sheet for Patients: EntrepreneurPulse.com.au  Fact Sheet for Healthcare Providers: IncredibleEmployment.be  This test is not yet approved or cleared by the [place] and has been authorized for detection and/or diagnosis of SARS-CoV-2 by FDA under an Emergency Use Authorization (EUA). This EUA will remain in effect (meaning this test can be used) for the duration of the COVID-19 declaration under Section 564(b)(1) of the Act, 21 U.S.C. section 360bbb-3(b)(1), unless the authorization is terminated or revoked.  Performed at [facility], [place] [address], [place], [place] [address]    Creatinine: Recent Labs    12/28/20 1222 12/28/20 1313 12/29/20 0657  CREATININE 4.33* 4.50* 3.03*   Baseline Creatinine: 1  Impression/Assessment:  74yo with left ureteral calculus, ARF  Plan:  -We discussed the management of kidney stones. These options include observation, ureteroscopy, shockwave lithotripsy (ESWL) and percutaneous nephrolithotomy (PCNL). We discussed which options are relevant to the patient's stone(s). We discussed the natural history of kidney stones as well as the complications of untreated stones and the impact on quality of life without treatment as well as with each of the above listed treatments. We also discussed the efficacy of each treatment in its ability to clear the stone burden. With any of these management options I discussed the signs and symptoms of infection and the need for emergent treatment should these be experienced. For each option we discussed the ability of each procedure to clear the patient of their stone burden.   For observation I described the risks which include but are not limited to silent renal damage, life-threatening infection, need for emergent surgery, failure to pass stone and pain.   For ureteroscopy I described the risks which include bleeding, infection, damage to contiguous structures, positioning injury, ureteral stricture, ureteral avulsion, ureteral injury, need for prolonged ureteral stent, inability to perform ureteroscopy, need for an interval procedure, inability to clear stone burden, stent discomfort/pain, heart attack, stroke, pulmonary embolus and the inherent risks with general anesthesia.   For shockwave lithotripsy I described the risks which include arrhythmia, kidney contusion, kidney hemorrhage, need for transfusion, pain, inability to adequately break up stone, inability to pass stone fragments, Steinstrasse, infection associated with  obstructing stones, need for alternate surgical procedure, need for repeat shockwave lithotripsy, MI, CVA, PE and the inherent risks with anesthesia/conscious sedation.   For PCNL I described the risks including positioning injury, pneumothorax, hydrothorax, need for chest tube, inability to clear stone burden, renal laceration, arterial venous fistula or malformation, need for embolization of kidney, loss of kidney or renal function, need for repeat procedure, need for prolonged nephrostomy tube, ureteral avulsion, MI, CVA, PE and the inherent risks of general anesthesia.   - The patient would like to proceed with Medical expulsive therapy. We will start flomax. If her creatinine fails to improve of she develops fever we will proceed with left ureteral stent placement  Nicolette Bang 12/29/2020, 4:32 PM

## 2020-12-30 ENCOUNTER — Inpatient Hospital Stay (HOSPITAL_COMMUNITY): Payer: PPO

## 2020-12-30 ENCOUNTER — Inpatient Hospital Stay (HOSPITAL_COMMUNITY): Payer: PPO | Admitting: Certified Registered Nurse Anesthetist

## 2020-12-30 ENCOUNTER — Encounter (HOSPITAL_COMMUNITY)
Admission: EM | Disposition: A | Discharge status: Home Health | Payer: Self-pay | Source: Home / Self Care | Attending: Student

## 2020-12-30 DIAGNOSIS — N390 Urinary tract infection, site not specified: Secondary | ICD-10-CM | POA: Diagnosis not present

## 2020-12-30 DIAGNOSIS — N179 Acute kidney failure, unspecified: Secondary | ICD-10-CM | POA: Diagnosis not present

## 2020-12-30 DIAGNOSIS — R7881 Bacteremia: Secondary | ICD-10-CM | POA: Diagnosis not present

## 2020-12-30 DIAGNOSIS — B958 Unspecified staphylococcus as the cause of diseases classified elsewhere: Secondary | ICD-10-CM | POA: Diagnosis not present

## 2020-12-30 HISTORY — PX: CYSTOSCOPY W/ URETERAL STENT PLACEMENT: SHX1429

## 2020-12-30 LAB — BLOOD CULTURE ID PANEL (REFLEXED) - BCID2
A.calcoaceticus-baumannii: NOT DETECTED
Bacteroides fragilis: NOT DETECTED
Candida albicans: NOT DETECTED
Candida auris: NOT DETECTED
Candida glabrata: NOT DETECTED
Candida krusei: NOT DETECTED
Candida parapsilosis: NOT DETECTED
Candida tropicalis: NOT DETECTED
Cryptococcus neoformans/gattii: NOT DETECTED
Enterobacter cloacae complex: NOT DETECTED
Enterobacterales: NOT DETECTED
Enterococcus Faecium: NOT DETECTED
Enterococcus faecalis: NOT DETECTED
Escherichia coli: NOT DETECTED
Haemophilus influenzae: NOT DETECTED
Klebsiella aerogenes: NOT DETECTED
Klebsiella oxytoca: NOT DETECTED
Klebsiella pneumoniae: NOT DETECTED
Listeria monocytogenes: NOT DETECTED
Methicillin resistance mecA/C: NOT DETECTED
Neisseria meningitidis: NOT DETECTED
Proteus species: NOT DETECTED
Pseudomonas aeruginosa: NOT DETECTED
Salmonella species: NOT DETECTED
Serratia marcescens: NOT DETECTED
Staphylococcus aureus (BCID): NOT DETECTED
Staphylococcus epidermidis: NOT DETECTED
Staphylococcus lugdunensis: DETECTED — AB
Staphylococcus species: DETECTED — AB
Stenotrophomonas maltophilia: NOT DETECTED
Streptococcus agalactiae: NOT DETECTED
Streptococcus pneumoniae: NOT DETECTED
Streptococcus pyogenes: NOT DETECTED
Streptococcus species: DETECTED — AB

## 2020-12-30 LAB — CBC
HCT: 35.4 % — ABNORMAL LOW (ref 36.0–46.0)
Hemoglobin: 11.3 g/dL — ABNORMAL LOW (ref 12.0–15.0)
MCH: 28.7 pg (ref 26.0–34.0)
MCHC: 31.9 g/dL (ref 30.0–36.0)
MCV: 89.8 fL (ref 80.0–100.0)
Platelets: 91 10*3/uL — ABNORMAL LOW (ref 150–400)
RBC: 3.94 MIL/uL (ref 3.87–5.11)
RDW: 14 % (ref 11.5–15.5)
WBC: 6.5 10*3/uL (ref 4.0–10.5)
nRBC: 0 % (ref 0.0–0.2)

## 2020-12-30 LAB — RENAL FUNCTION PANEL
Albumin: 2.3 g/dL — ABNORMAL LOW (ref 3.5–5.0)
Anion gap: 4 — ABNORMAL LOW (ref 5–15)
BUN: 69 mg/dL — ABNORMAL HIGH (ref 8–23)
CO2: 26 mmol/L (ref 22–32)
Calcium: 8.2 mg/dL — ABNORMAL LOW (ref 8.9–10.3)
Chloride: 109 mmol/L (ref 98–111)
Creatinine, Ser: 2.03 mg/dL — ABNORMAL HIGH (ref 0.44–1.00)
GFR, Estimated: 25 mL/min — ABNORMAL LOW (ref >60)
Glucose, Bld: 119 mg/dL — ABNORMAL HIGH (ref 70–99)
Phosphorus: 3.5 mg/dL (ref 2.5–4.6)
Potassium: 3.5 mmol/L (ref 3.5–5.1)
Sodium: 139 mmol/L (ref 135–145)

## 2020-12-30 LAB — MAGNESIUM: Magnesium: 2 mg/dL (ref 1.7–2.4)

## 2020-12-30 SURGERY — CYSTOSCOPY, WITH RETROGRADE PYELOGRAM AND URETERAL STENT INSERTION
Anesthesia: General | Site: Ureter | Laterality: Left

## 2020-12-30 MED ORDER — LIDOCAINE 2% (20 MG/ML) 5 ML SYRINGE
INTRAMUSCULAR | Status: AC
  Filled 2020-12-30: qty 5

## 2020-12-30 MED ORDER — PROPOFOL 10 MG/ML IV BOLUS
INTRAVENOUS | Status: AC
  Filled 2020-12-30: qty 20

## 2020-12-30 MED ORDER — ACETAMINOPHEN 325 MG PO TABS: 325.0000 mg | ORAL_TABLET | ORAL | Status: DC | PRN

## 2020-12-30 MED ORDER — GUAIFENESIN-DM 100-10 MG/5ML PO SYRP
5.0000 mL | ORAL_SOLUTION | ORAL | Status: DC | PRN
  Administered 2020-12-30 – 2021-01-02 (×3): 5 mL via ORAL
  Filled 2020-12-30 (×3): qty 10

## 2020-12-30 MED ORDER — FENTANYL CITRATE (PF) 100 MCG/2ML IJ SOLN
INTRAMUSCULAR | Status: DC | PRN
  Administered 2020-12-30: 25 ug via INTRAVENOUS

## 2020-12-30 MED ORDER — PHENYLEPHRINE 40 MCG/ML (10ML) SYRINGE FOR IV PUSH (FOR BLOOD PRESSURE SUPPORT)
PREFILLED_SYRINGE | INTRAVENOUS | Status: DC | PRN
  Administered 2020-12-30: 120 ug via INTRAVENOUS

## 2020-12-30 MED ORDER — DEXAMETHASONE SODIUM PHOSPHATE 10 MG/ML IJ SOLN
INTRAMUSCULAR | Status: DC | PRN
  Administered 2020-12-30: 10 mg via INTRAVENOUS

## 2020-12-30 MED ORDER — LIDOCAINE 2% (20 MG/ML) 5 ML SYRINGE
INTRAMUSCULAR | Status: DC | PRN
  Administered 2020-12-30: 60 mg via INTRAVENOUS

## 2020-12-30 MED ORDER — OXYCODONE HCL 5 MG PO TABS: 5.0000 mg | ORAL_TABLET | Freq: Once | ORAL | Status: DC | PRN

## 2020-12-30 MED ORDER — ONDANSETRON HCL 4 MG/2ML IJ SOLN
INTRAMUSCULAR | Status: DC | PRN
  Administered 2020-12-30: 4 mg via INTRAVENOUS

## 2020-12-30 MED ORDER — IOHEXOL 300 MG/ML  SOLN
INTRAMUSCULAR | Status: DC | PRN
  Administered 2020-12-30: 8 mL

## 2020-12-30 MED ORDER — SODIUM CHLORIDE 0.9 % IR SOLN
Status: DC | PRN
  Administered 2020-12-30: 3000 mL via INTRAVESICAL

## 2020-12-30 MED ORDER — FENTANYL CITRATE (PF) 100 MCG/2ML IJ SOLN
INTRAMUSCULAR | Status: AC
  Filled 2020-12-30: qty 2

## 2020-12-30 MED ORDER — ONDANSETRON HCL 4 MG/2ML IJ SOLN: 4.0000 mg | Freq: Once | INTRAMUSCULAR | Status: DC | PRN

## 2020-12-30 MED ORDER — SUCCINYLCHOLINE CHLORIDE 200 MG/10ML IV SOSY
PREFILLED_SYRINGE | INTRAVENOUS | Status: DC | PRN
  Administered 2020-12-30: 140 mg via INTRAVENOUS

## 2020-12-30 MED ORDER — ONDANSETRON HCL 4 MG/2ML IJ SOLN
INTRAMUSCULAR | Status: AC
  Filled 2020-12-30: qty 2

## 2020-12-30 MED ORDER — DEXAMETHASONE SODIUM PHOSPHATE 10 MG/ML IJ SOLN
INTRAMUSCULAR | Status: AC
  Filled 2020-12-30: qty 1

## 2020-12-30 MED ORDER — LACTATED RINGERS IV SOLN: INTRAVENOUS | Status: DC | PRN

## 2020-12-30 MED ORDER — FENTANYL CITRATE (PF) 100 MCG/2ML IJ SOLN: 25.0000 ug | INTRAMUSCULAR | Status: DC | PRN

## 2020-12-30 MED ORDER — ACETAMINOPHEN 160 MG/5ML PO SOLN: 325.0000 mg | ORAL | Status: DC | PRN

## 2020-12-30 MED ORDER — PROPOFOL 10 MG/ML IV BOLUS
INTRAVENOUS | Status: DC | PRN
  Administered 2020-12-30: 150 mg via INTRAVENOUS

## 2020-12-30 MED ORDER — OXYCODONE HCL 5 MG/5ML PO SOLN: 5.0000 mg | Freq: Once | ORAL | Status: DC | PRN

## 2020-12-30 MED ORDER — MEPERIDINE HCL 50 MG/ML IJ SOLN: 6.2500 mg | INTRAMUSCULAR | Status: DC | PRN

## 2020-12-30 SURGICAL SUPPLY — 10 items
BAG URO CATCHER STRL LF (MISCELLANEOUS) ×2 IMPLANT
CATH INTERMIT  6FR 70CM (CATHETERS) ×2 IMPLANT
CLOTH BEACON ORANGE TIMEOUT ST (SAFETY) ×2 IMPLANT
GLOVE SURG ENC TEXT LTX SZ8 (GLOVE) ×2 IMPLANT
GOWN STRL REUS W/TWL XL LVL3 (GOWN DISPOSABLE) ×4 IMPLANT
GUIDEWIRE STR DUAL SENSOR (WIRE) ×2 IMPLANT
KIT TURNOVER KIT A (KITS) ×2 IMPLANT
MANIFOLD NEPTUNE II (INSTRUMENTS) ×2 IMPLANT
PACK CYSTO (CUSTOM PROCEDURE TRAY) ×2 IMPLANT
TUBING CONNECTING 10 (TUBING) ×2 IMPLANT

## 2020-12-30 NOTE — Anesthesia Preprocedure Evaluation (Addendum)
Anesthesia Evaluation  Patient identified by MRN, date of birth, ID band Patient awake    Reviewed: Allergy & Precautions, NPO status , Patient's Chart, lab work & pertinent test results  Airway Mallampati: I  TM Distance: >3 FB Neck ROM: Full    Dental  (+) Teeth Intact, Dental Advisory Given, Loose, Missing,    Pulmonary asthma , COPD,  oxygen dependent, former smoker,     + decreased breath sounds      Cardiovascular hypertension, Pt. on medications Normal cardiovascular exam Rhythm:Regular Rate:Normal     Neuro/Psych Anxiety negative neurological ROS     GI/Hepatic Neg liver ROS, GERD  Medicated and Controlled,  Endo/Other  negative endocrine ROS  Renal/GU Renal disease     Musculoskeletal   Abdominal Normal abdominal exam  (+)   Peds  Hematology negative hematology ROS (+)   Anesthesia Other Findings   Reproductive/Obstetrics                            Lab Results  Component Value Date   WBC 6.5 12/30/2020   HGB 11.3 (L) 12/30/2020   HCT 35.4 (L) 12/30/2020   MCV 89.8 12/30/2020   PLT 91 (L) 12/30/2020   Lab Results  Component Value Date   CREATININE 2.03 (H) 12/30/2020   BUN 69 (H) 12/30/2020   NA 139 12/30/2020   K 3.5 12/30/2020   CL 109 12/30/2020   CO2 26 12/30/2020    Anesthesia Physical  Anesthesia Plan  ASA: 3 and emergent  Anesthesia Plan: General   Post-op Pain Management:    Induction: Intravenous, Cricoid pressure planned and Rapid sequence  PONV Risk Score and Plan: 3 and Ondansetron, Dexamethasone and Midazolam  Airway Management Planned: Oral ETT  Additional Equipment: None  Intra-op Plan:   Post-operative Plan: Extubation in OR  Informed Consent: I have reviewed the patients History and Physical, chart, labs and discussed the procedure including the risks, benefits and alternatives for the proposed anesthesia with the patient or authorized  representative who has indicated his/her understanding and acceptance.       Plan Discussed with: CRNA, Surgeon and Anesthesiologist  Anesthesia Plan Comments:         Anesthesia Quick Evaluation

## 2020-12-30 NOTE — Interval H&P Note (Signed)
History and Physical Interval Note:  12/30/2020 10:10 AM  [name]  has presented today for surgery, with the diagnosis of LEFT URETERAL STONE.  The various methods of treatment have been discussed with the patient and family. After consideration of risks, benefits and other options for treatment, the patient has consented to  Procedure(s): CYSTOSCOPY WITH RETROGRADE PYELOGRAM/URETERAL STENT PLACEMENT (Left) as a surgical intervention.  The patient's history has been reviewed, patient examined, no change in status, stable for surgery.  I have reviewed the patient's chart and labs.  Questions were answered to the patient's satisfaction.     Nicolette Bang

## 2020-12-30 NOTE — Anesthesia Postprocedure Evaluation (Signed)
Anesthesia Post Note  Patient: Brandy Morales  Procedure(s) Performed: CYSTOSCOPY WITH RETROGRADE PYELOGRAM/URETERAL STENT PLACEMENT (Left: Ureter)     Patient location during evaluation: PACU Anesthesia Type: General Level of consciousness: awake and alert Pain management: pain level controlled Vital Signs Assessment: post-procedure vital signs reviewed and stable Respiratory status: spontaneous breathing, nonlabored ventilation, respiratory function stable and patient connected to nasal cannula oxygen Cardiovascular status: blood pressure returned to baseline and stable Postop Assessment: no apparent nausea or vomiting Anesthetic complications: no   No notable events documented.  Last Vitals:  Vitals:   12/30/20 0431 12/30/20 0724  BP: 122/68   Pulse: 80   Resp: 20   Temp: 36.5 C   SpO2: 98% 98%    Last Pain:  Vitals:   12/30/20 0431  TempSrc: Oral  PainSc:                  Azari Janssens

## 2020-12-30 NOTE — Consult Note (Addendum)
[facility] for Infectious Diseases                                                                                        Patient Identification: Patient Name: Brandy Morales MRN: [id number] [place] Date: 12/28/2020 10:59 AM Today's Date: 12/30/2020 Reason for consult: bacteremia  Requesting provider: Wendee Beavers   Active Problems:   AKI (acute kidney injury) ([place])   Antibiotics: Vancomycin/cefepime 8/5-current   Lines/Tubes: Left ureter stent, PIVs   Assessment Polymicrobial bacteremia (Staphylococcus lugdunensis and E. Coli) Left ureterolithiasis/moderate Hydronephrosis Complicated UTI  status post left retrograde pyelogram 3 and left JJ stent placement on 8/7  COPD /chronic respiratory failure on home oxygen   Recommendations  Continue ceftriaxone as is ( Staph lugdunensis is negative for MecA/C) Repeat blood cultures and operative urine cultures  Follow sensi for staph lugdunensis and E coli  TTE ( ordered) Monitor CBC, CMP and Vancomycin Trough Follow Urology recommendations  Discussed with patient/daughter Mackey Birchwood and primary  Dr Juleen China will assume care starting tomorrow.   Rest of the management as per the primary team. Please call with questions or concerns.  Thank you for the consult  Rosiland Oz, MD Infectious Disease Physician [facility] for Infectious Disease [address] [place], [place] [address] Phone: [phone]  Fax: [phone]  __________________________________________________________________________________________________________ HPI and Hospital Course: 75 year old female with PMH of nephrolithiasis S/P cystoscopy, left ureteroscopy with laser lithotripsy and left ureteral stent placement 11/30/2017 followed by cystoscopy with left ureteroscopy, laser lithotripsy, retrograde pyelogram, ureteral stent placement 02/01/2018, COPD with chronic  respiratory failure on 2 L oxygen, extensive history of smoking in the past with pulmonary nodules, hypertension and anxiety who presented to the ED on 8/5 with complaint of generalized weakness and back pain for 4 days.  Associated symptoms include nausea and vomiting which was nonbloody bloody and nonbilious.  She also had poor p.o. appetite for the same duration. Patient is somewhat drowsy after her procedure with Urology this morning. History obtained from daughter and chart review. Daughter lives in [place] and she talks to her mother every day in the morning and evening. On Wednesday, her mother was talking funny, Thursday she continued to feel poorly and Friday, she advised to call EMS. Patient denies any known fevers,, chills, sweats.  Denies any chest pain, shortness of breath and cough prior to admission.  However reports lower abdominal pain.   At ED, patient was afebrile, WBC 16.4 with left shift Labs was remarkable for AKI with creatinine 4.33, BNP 401, lactic acid 1.6 Blood cultures 8/5 positive for staph lugdunensis, E. Coli  CT abdomen pelvis with 6 mm left UPJ calculus, mild with resulting moderate hydronephrosis.  Cholelithiasis with interval distention of gallbladder.  No gallbladder wall thickening or additional findings to suggest acute cholelithiasis.   Patient underwent left retrograde pyelography, intraoperative fluoroscopy and left JJ stent placement on 8/7 by urology   ROS: as above, some what limited as patient seen post procedure and drowsy  Past Medical History:  Diagnosis Date   Anxiety    Asthma    COPD (chronic obstructive pulmonary disease) ([place])  GERD (gastroesophageal reflux disease)    History of kidney stones    History of radiation therapy 01/05/2020   IMRT right lung 54 Gy in 3 Fractions 12/29/2019 through 01/05/2020  Dr Gery Pray   Hypertension    Past Surgical History:  Procedure Laterality Date   ABDOMINAL HYSTERECTOMY  age 16   partial 1 ovary  left   APPENDECTOMY     colonscopy     CYSTOSCOPY WITH RETROGRADE PYELOGRAM, URETEROSCOPY AND STENT PLACEMENT N/A 11/30/2017   Procedure: CYSTOSCOPY WITH BILATERAL RETROGRADE PYELOGRAM, LEFT URETEROSCOPY/ LEFT LASER LITHO AND LEFT STENT PLACEMENT;  Surgeon: Lucas Mallow, MD;  Location: [facility];  Service: Urology;  Laterality: N/A;   CYSTOSCOPY WITH RETROGRADE PYELOGRAM, URETEROSCOPY AND STENT PLACEMENT Left 02/01/2018   Procedure: CYSTOSCOPY WITH LEFT RETROGRADE PYELOGRAM, URETEROSCOPY HOLMIUM LASER AND STENT PLACEMENT;  Surgeon: Lucas Mallow, MD;  Location: [facility];  Service: Urology;  Laterality: Left;   EYE SURGERY Bilateral    Cataract   TONSILLECTOMY      Scheduled Meds:  atorvastatin  5 mg Oral Once per day on Mon Wed Fri   Chlorhexidine Gluconate Cloth  6 each Topical Daily   citalopram  20 mg Oral Daily   ezetimibe  10 mg Oral QHS   fluticasone  1 spray Each Nare Daily   fluticasone furoate-vilanterol  1 puff Inhalation QPM   loratadine  10 mg Oral Daily   mouth rinse  15 mL Mouth Rinse BID   pantoprazole  40 mg Oral Daily   polyethylene glycol  17 g Oral BID   polyvinyl alcohol   Both Eyes Daily   tamsulosin  0.8 mg Oral QPC supper   umeclidinium bromide  1 puff Inhalation Daily   Continuous Infusions:  cefTRIAXone (ROCEPHIN)  IV     dextrose 5 % and 0.9% NaCl 100 mL/hr at 12/30/20 0341   PRN Meds:.acetaminophen **OR** acetaminophen, fentaNYL (SUBLIMAZE) injection, fentaNYL (SUBLIMAZE) injection, guaiFENesin-dextromethorphan, hydrALAZINE, ipratropium-albuterol, ondansetron **OR** ondansetron (ZOFRAN) IV  Allergies  Allergen Reactions   Statins Cough   Latex Other (See Comments)    Broke out on face   Social History   Socioeconomic History   Marital status: Widowed    Spouse name: Not on file   Number of children: Not on file   Years of education: Not on file   Highest education level: Not on file  Occupational History   Not on file  Tobacco Use    Smoking status: Former    Packs/day: 2.00    Years: 55.00    Pack years: 110.00    Types: Cigarettes    Quit date: 2016    Years since quitting: 6.6   Smokeless tobacco: Never  Vaping Use   Vaping Use: Never used  Substance and Sexual Activity   Alcohol use: Yes    Alcohol/week: 1.0 standard drink    Types: 1 Glasses of wine per week    Comment: weekly   Drug use: Never   Sexual activity: Not Currently  Other Topics Concern   Not on file  Social History Narrative   Not on file   Social Determinants of Health   Financial Resource Strain: Not on file  Food Insecurity: Not on file  Transportation Needs: Not on file  Physical Activity: Not on file  Stress: Not on file  Social Connections: Not on file  Intimate Partner Violence: Not on file   History reviewed. No pertinent family history.   Vitals BP 128/62 (  BP Location: Right Arm)   Pulse 98   Temp 97.6 F (36.4 C) (Oral)   Resp (!) 22   Ht 5\' 2"  (1.575 m)   Wt 79.9 kg   SpO2 98%   BMI 32.22 kg/m    Physical Exam Constitutional:  lying in bed, has a nasal cannula, daughter at bedside     Comments: appears drowsy and sleeeping   Cardiovascular:     Rate and Rhythm: Normal rate and regular rhythm.     Heart sounds:   Pulmonary:     Effort: Pulmonary effort is normal.     Comments:   Abdominal:     Palpations: Abdomen is soft.     Tenderness: Non tender and non distended   Musculoskeletal:        General: No swelling or tenderness.   Skin:    Comments: No obvious lesions or rashes   Neurological:     General: No focal deficit present.   Psychiatric:        Mood and Affect: drowsy    Pertinent Microbiology Results for orders placed or performed during the hospital encounter of 12/28/20  Blood culture (routine x 2)     Status: Abnormal (Preliminary result)   Collection Time: 12/28/20  3:37 PM   Specimen: BLOOD LEFT ARM  Result Value Ref Range Status   Specimen Description BLOOD LEFT ARM  Final    Special Requests   Final    BOTTLES DRAWN AEROBIC AND ANAEROBIC Blood Culture adequate volume   Culture  Setup Time   Final    GRAM NEGATIVE RODS ANAEROBIC BOTTLE ONLY CRITICAL RESULT CALLED TO, READ BACK BY AND VERIFIED WITH: AHN PHAM PHARMD @1641  12/29/20 [place]    Culture (A)  Final    ESCHERICHIA COLI SUSCEPTIBILITIES TO FOLLOW Performed at [facility], [address], [place], [place] [address]    Report Status PENDING  Incomplete  Blood Culture ID Panel (Reflexed)     Status: Abnormal   Collection Time: 12/28/20  3:37 PM  Result Value Ref Range Status   Enterococcus faecalis NOT DETECTED NOT DETECTED Final   Enterococcus Faecium NOT DETECTED NOT DETECTED Final   Listeria monocytogenes NOT DETECTED NOT DETECTED Final   Staphylococcus species NOT DETECTED NOT DETECTED Final   Staphylococcus aureus (BCID) NOT DETECTED NOT DETECTED Final   Staphylococcus epidermidis NOT DETECTED NOT DETECTED Final   Staphylococcus lugdunensis NOT DETECTED NOT DETECTED Final   Streptococcus species NOT DETECTED NOT DETECTED Final   Streptococcus agalactiae NOT DETECTED NOT DETECTED Final   Streptococcus pneumoniae NOT DETECTED NOT DETECTED Final   Streptococcus pyogenes NOT DETECTED NOT DETECTED Final   A.calcoaceticus-baumannii NOT DETECTED NOT DETECTED Final   Bacteroides fragilis NOT DETECTED NOT DETECTED Final   Enterobacterales DETECTED (A) NOT DETECTED Final    Comment: Enterobacterales represent a large order of gram negative bacteria, not a single organism. CRITICAL RESULT CALLED TO, READ BACK BY AND VERIFIED WITH: AHN PHAM PHARMD @1641  12/29/20 [place]    Enterobacter cloacae complex NOT DETECTED NOT DETECTED Final   Escherichia coli DETECTED (A) NOT DETECTED Final    Comment: CRITICAL RESULT CALLED TO, READ BACK BY AND VERIFIED WITH: AHN PHAM PHARMD @1641  12/29/20 EB    Klebsiella aerogenes NOT DETECTED NOT DETECTED Final   Klebsiella oxytoca NOT DETECTED NOT DETECTED Final    Klebsiella pneumoniae NOT DETECTED NOT DETECTED Final   Proteus species NOT DETECTED NOT DETECTED Final   Salmonella species NOT DETECTED NOT DETECTED  Final   Serratia marcescens NOT DETECTED NOT DETECTED Final   Haemophilus influenzae NOT DETECTED NOT DETECTED Final   Neisseria meningitidis NOT DETECTED NOT DETECTED Final   Pseudomonas aeruginosa NOT DETECTED NOT DETECTED Final   Stenotrophomonas maltophilia NOT DETECTED NOT DETECTED Final   Candida albicans NOT DETECTED NOT DETECTED Final   Candida auris NOT DETECTED NOT DETECTED Final   Candida glabrata NOT DETECTED NOT DETECTED Final   Candida krusei NOT DETECTED NOT DETECTED Final   Candida parapsilosis NOT DETECTED NOT DETECTED Final   Candida tropicalis NOT DETECTED NOT DETECTED Final   Cryptococcus neoformans/gattii NOT DETECTED NOT DETECTED Final   CTX-M ESBL NOT DETECTED NOT DETECTED Final   Carbapenem resistance IMP NOT DETECTED NOT DETECTED Final   Carbapenem resistance KPC NOT DETECTED NOT DETECTED Final   Carbapenem resistance NDM NOT DETECTED NOT DETECTED Final   Carbapenem resist OXA 48 LIKE NOT DETECTED NOT DETECTED Final   Carbapenem resistance VIM NOT DETECTED NOT DETECTED Final    Comment: Performed at [facility], [address] [place], [place] [address]  Blood culture (routine x 2)     Status: None (Preliminary result)   Collection Time: 12/28/20  3:42 PM   Specimen: BLOOD  Result Value Ref Range Status   Specimen Description BLOOD LEFT ANTECUBITAL  Final   Special Requests   Final    BOTTLES DRAWN AEROBIC AND ANAEROBIC Blood Culture adequate volume   Culture  Setup Time   Final    GRAM POSITIVE COCCI ANAEROBIC BOTTLE ONLY CRITICAL RESULT CALLED TO, READ BACK BY AND VERIFIED WITH: L POINDEXTER,PHARMD@0031  12/30/20 [place]    Culture   Final    GRAM POSITIVE COCCI TOO YOUNG TO READ Performed at [facility], [place] [address], [place], [place] [address]    Report Status PENDING  Incomplete  Blood  Culture ID Panel (Reflexed)     Status: Abnormal   Collection Time: 12/28/20  3:42 PM  Result Value Ref Range Status   Enterococcus faecalis NOT DETECTED NOT DETECTED Final   Enterococcus Faecium NOT DETECTED NOT DETECTED Final   Listeria monocytogenes NOT DETECTED NOT DETECTED Final   Staphylococcus species DETECTED (A) NOT DETECTED Final    Comment: CRITICAL RESULT CALLED TO, READ BACK BY AND VERIFIED WITH: L POINDEXTER,PHARM @0031  12/30/20 [place]    Staphylococcus aureus (BCID) NOT DETECTED NOT DETECTED Final   Staphylococcus epidermidis NOT DETECTED NOT DETECTED Final   Staphylococcus lugdunensis DETECTED (A) NOT DETECTED Final    Comment: CRITICAL RESULT CALLED TO, READ BACK BY AND VERIFIED WITH: L POINDEXTER,PHARMD@0033  12/30/20 [place]    Streptococcus species DETECTED (A) NOT DETECTED Final    Comment: Not Enterococcus species, Streptococcus agalactiae, Streptococcus pyogenes, or Streptococcus pneumoniae. CRITICAL RESULT CALLED TO, READ BACK BY AND VERIFIED WITH: L POINDEXTER,PHARMD@0031  12/30/20 [place]    Streptococcus agalactiae NOT DETECTED NOT DETECTED Final   Streptococcus pneumoniae NOT DETECTED NOT DETECTED Final   Streptococcus pyogenes NOT DETECTED NOT DETECTED Final   A.calcoaceticus-baumannii NOT DETECTED NOT DETECTED Final   Bacteroides fragilis NOT DETECTED NOT DETECTED Final   Enterobacterales NOT DETECTED NOT DETECTED Final   Enterobacter cloacae complex NOT DETECTED NOT DETECTED Final   Escherichia coli NOT DETECTED NOT DETECTED Final   Klebsiella aerogenes NOT DETECTED NOT DETECTED Final   Klebsiella oxytoca NOT DETECTED NOT DETECTED Final   Klebsiella pneumoniae NOT DETECTED NOT DETECTED Final   Proteus species NOT DETECTED NOT DETECTED Final   Salmonella species NOT DETECTED NOT DETECTED  Final   Serratia marcescens NOT DETECTED NOT DETECTED Final   Haemophilus influenzae NOT DETECTED NOT DETECTED Final   Neisseria meningitidis NOT DETECTED NOT DETECTED Final    Pseudomonas aeruginosa NOT DETECTED NOT DETECTED Final   Stenotrophomonas maltophilia NOT DETECTED NOT DETECTED Final   Candida albicans NOT DETECTED NOT DETECTED Final   Candida auris NOT DETECTED NOT DETECTED Final   Candida glabrata NOT DETECTED NOT DETECTED Final   Candida krusei NOT DETECTED NOT DETECTED Final   Candida parapsilosis NOT DETECTED NOT DETECTED Final   Candida tropicalis NOT DETECTED NOT DETECTED Final   Cryptococcus neoformans/gattii NOT DETECTED NOT DETECTED Final   Methicillin resistance mecA/C NOT DETECTED NOT DETECTED Final    Comment: Performed at [facility], [place] [address], [place], [place] [address]  Resp Panel by RT-PCR (Flu A&B, Covid) Nasopharyngeal Swab     Status: None   Collection Time: 12/28/20  7:19 PM   Specimen: Nasopharyngeal Swab; Nasopharyngeal(NP) swabs in vial transport medium  Result Value Ref Range Status   SARS Coronavirus 2 by RT PCR NEGATIVE NEGATIVE Final    Comment: (NOTE) SARS-CoV-2 target nucleic acids are NOT DETECTED.  The SARS-CoV-2 RNA is generally detectable in upper respiratory specimens during the acute phase of infection. The lowest concentration of SARS-CoV-2 viral copies this assay can detect is 138 copies/mL. A negative result does not preclude SARS-Cov-2 infection and should not be used as the sole basis for treatment or other patient management decisions. A negative result may occur with  improper specimen collection/handling, submission of specimen other than nasopharyngeal swab, presence of viral mutation(s) within the areas targeted by this assay, and inadequate number of viral copies(<138 copies/mL). A negative result must be combined with clinical observations, patient history, and epidemiological information. The expected result is Negative.  Fact Sheet for Patients:  EntrepreneurPulse.com.au  Fact Sheet for Healthcare Providers:  IncredibleEmployment.be  This test is  no t yet approved or cleared by the [place] FDA and  has been authorized for detection and/or diagnosis of SARS-CoV-2 by FDA under an Emergency Use Authorization (EUA). This EUA will remain  in effect (meaning this test can be used) for the duration of the COVID-19 declaration under Section 564(b)(1) of the Act, 21 U.S.C.section 360bbb-3(b)(1), unless the authorization is terminated  or revoked sooner.       Influenza A by PCR NEGATIVE NEGATIVE Final   Influenza B by PCR NEGATIVE NEGATIVE Final    Comment: (NOTE) The Xpert Xpress SARS-CoV-2/FLU/RSV plus assay is intended as an aid in the diagnosis of influenza from Nasopharyngeal swab specimens and should not be used as a sole basis for treatment. Nasal washings and aspirates are unacceptable for Xpert Xpress SARS-CoV-2/FLU/RSV testing.  Fact Sheet for Patients: EntrepreneurPulse.com.au  Fact Sheet for Healthcare Providers: IncredibleEmployment.be  This test is not yet approved or cleared by the [place] FDA and has been authorized for detection and/or diagnosis of SARS-CoV-2 by FDA under an Emergency Use Authorization (EUA). This EUA will remain in effect (meaning this test can be used) for the duration of the COVID-19 declaration under Section 564(b)(1) of the Act, 21 U.S.C. section 360bbb-3(b)(1), unless the authorization is terminated or revoked.  Performed at [facility], [place] [address], [place], [place] [address]      Pertinent Lab seen by me: CBC Latest Ref Rng & Units 12/30/2020 12/29/2020 12/28/2020  WBC 4.0 - 10.5 K/uL 6.5 8.3 -  Hemoglobin 12.0 - 15.0 g/dL 11.3(L) 10.9(L) 14.3  Hematocrit 36.0 - 46.0 % 35.4(L) 33.2(L) 42.0  Platelets 150 - 400 K/uL 91(L) 89(L) -   CMP Latest Ref Rng & Units 12/30/2020 12/29/2020 12/28/2020  Glucose 70 - 99 mg/dL 119(H) 154(H) 78  BUN 8 - 23 mg/dL 69(H) 81(H) 86(H)  Creatinine 0.44 - 1.00 mg/dL 2.03(H) 3.03(H) 4.50(H)   Sodium 135 - 145 mmol/L 139 137 135  Potassium 3.5 - 5.1 mmol/L 3.5 3.5 3.9  Chloride 98 - 111 mmol/L 109 103 99  CO2 22 - 32 mmol/L 26 22 -  Calcium 8.9 - 10.3 mg/dL 8.2(L) 7.8(L) -  Total Protein 6.5 - 8.1 g/dL - - -  Total Bilirubin 0.3 - 1.2 mg/dL - - -  Alkaline Phos 38 - 126 U/L - - -  AST 15 - 41 U/L - - -  ALT 0 - 44 U/L - - -    Pertinent Imagings/Other Imagings Plain films and CT images have been personally visualized and interpreted; radiology reports have been reviewed. Decision making incorporated into the Impression / Recommendations.  CT abdomen /pelvis 12/28/20 FINDINGS: Lower chest: Centrilobular emphysematous change of the lung bases, greater on right. Coronary calcifications   Hepatobiliary: Multiple circumscribed hypodense lesions within liver, similar to comparison, and likely consistent with hepatic cysts. Interval distention of gallbladder, with radiodense gallstone. No evidence of gallbladder wall thickening. No biliary ductal dilation.   Pancreas: Unremarkable. No pancreatic ductal dilatation or surrounding inflammatory changes.   Spleen: Normal in size without focal abnormality.   Adrenals/Urinary Tract:   *2.5 cm left adrenal thickening, unchanged. *Right renal calcification is unchanged.  No right hydronephrosis. *Left perirenal stranding. 6 mm left ureteropelvic junction nephrolith, with resulting moderate hydronephrosis   Stomach/Bowel: Stomach is within normal limits. Appendix appears normal. Severe sigmoid diverticulosis, without evidence of diverticulitis. Mild rectal distention of stool. No evidence of bowel wall thickening, distention, or inflammatory changes.   Vascular/Lymphatic: Aortic and iliac vascular calcifications, without aneurysmal dilation. No enlarged abdominal or pelvic lymph nodes.   Reproductive: Status post hysterectomy. No adnexal masses.   Other: No abdominal wall hernia or abnormality. No abdominopelvic ascites.    Musculoskeletal: Multilevel degenerative change of imaged spine. No acute osseous findings.   IMPRESSION: 1. 6 mm left UPJ calculus, mild with resulting moderate hydronephrosis. 2. Cholelithiasis with interval distention of gallbladder. No gallbladder wall thickening or additional findings to suggest acute cholelithiasis. 3. Additional chronic and senescent changes, as above. 4. Aortic Atherosclerosis (ICD10-I70.0) and Emphysema (ICD10-J43.9).   I spent more than 70  minutes for this patient encounter including review of prior medical records/discussing diagnostics and treatment plan with the patient/family/coordinate care with primary/other specialits with greater than 50% of time in face to face encounter.   Electronically signed by:   Rosiland Oz, MD Infectious Disease Physician [facility] for Infectious Disease Pager: [phone]

## 2020-12-30 NOTE — Progress Notes (Signed)
PROGRESS NOTE  Brandy Morales POE:[id number] DOB: [birth year]   PCP: [name], MD  Patient is from: Home.  Very independent at baseline.  DOA: 12/28/2020 LOS: 2  Chief complaints:  Chief Complaint  Patient presents with   Weakness     Brief Narrative / Interim history: 75 y.o. 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  She had Foley placed.  Started on BSA, Flomax and IV fluid, and admitted.  AKI improved with IV fluid.  She had ureteral stent placed on 8/7.  Blood culture with E. coli and staph lugdunensis.  ID consulted.  Repeat blood culture and TTE ordered.  ID and urology following.  Subjective: Seen and examined earlier this afternoon after she returned from ureteral stent placement.  Lying comfortably.  No complaints.  She denies chest pain, dyspnea, GI or UTI symptoms other than mild left flank pain.  Patient's daughter at bedside.  Objective: Vitals:   12/30/20 1116 12/30/20 1130 12/30/20 1145 12/30/20 1203  BP: 112/70 (!) 105/51 (!) 117/55 128/62  Pulse: (!) 119 (!) 114 (!) 104 98  Resp: (!) 21 (!) 21 (!) 22 (!) 22  Temp: 97.7 F (36.5 C)  97.8 F (36.6 C) 97.6 F (36.4 C)  TempSrc:    Oral  SpO2: 100% 96% 99% 98%  Weight:      Height:        Intake/Output Summary (Last 24 hours) at 12/30/2020 1528 Last data filed at 12/30/2020 1111 Gross per 24 hour  Intake 4139.26 ml  Output 2050 ml  Net 2089.26 ml   Filed Weights   12/28/20 1115 12/29/20 1537  Weight: 78 kg 79.9 kg    Examination:  GENERAL: No apparent distress.  Nontoxic. HEENT: MMM.  Vision and hearing grossly intact.  NECK: Supple.  No apparent JVD.  RESP: 98% on  2 L.  No IWOB.  Fair aeration bilaterally. CVS:  RRR. Heart sounds normal.  ABD/GI/GU: BS+. Abd soft, NTND.  Indwelling Foley. MSK/EXT:  Moves extremities. No apparent deformity. No edema.  SKIN: no apparent skin lesion or wound NEURO: Awake and alert. Oriented appropriately.  No apparent focal neuro deficit. PSYCH: Calm. Normal affect.   Procedures:  8/7-left ureteral stent by Dr. Alyson Ingles.  Microbiology summarized: COVID-19 and influenza PCR nonreactive. Blood cultures NGTD. Urine cultures pending.  Assessment & Plan: AKI/azotemia: B/l Cr 0.82.  Multifactorial-infection, poor p.o. intake, nephrotoxic meds and obstructive uropathy.   CK within normal.  Status post ureteral stent.  Improving. Recent Labs    12/28/20 1222 12/28/20 1313 12/29/20 0657 12/30/20 0450  BUN 90* 86* 81* 69*  CREATININE 4.33* 4.50* 3.03* 2.03*  -Continue indwelling Foley catheter and IV fluid. -Avoid nephrotoxic meds -Recheck renal function in the morning  Polymicrobial bacteremia-blood culture with E. coli in 1 bottle and Staphylococcus lugdunensis (negative for MecA/C) in other -ID following  -Continue IV ceftriaxone  -Repeat blood culture  -TTE  Complicated UTI-unfortunately, urine sample was not sent for culture although it is marked as collected on 8/5 Left UPJ stone with moderate hydronephrosis-normally follows with Dr. Gloriann Loan -Urology following  -S/p left ureteral stent by Dr. Alyson Ingles on 8/7 -IV cefepime>> IV ceftriaxone   Nausea and vomiting: Resolved. -Treat AKI, nephrolithiasis and infection -IV  Zofran as needed -Continue IV fluid  Normocytic anemia: H&H stable after initial drop, likely from hemodilution. Recent Labs    12/28/20 1222 12/28/20 1313 12/29/20 0657 12/30/20 0450  HGB 13.6 14.3 10.9* 11.3*  -Continue monitoring.  Lactic acidosis: 1.6>> 2.3>> 0.6.  Resolved.   Elevated BNP/troponin-patient has no cardiopulmonary symptoms.  Appears dry on exam.  No history of CHF  or CAD.  Likely delayed clearance in the setting of AKI.    Chronic COPD/chronic hypoxic respiratory failure: Stable on home 2 L at baseline.   -minimum oxygen to keep saturation above 88% despite home 2 L -Continue home breathing treatments with as needed DuoNeb   Essential hypertension: Normotensive.  On Hyzaar at home. 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 -Continue home Celexa   Generalized weakness: Very independent at baseline.  Able to drive herself.  She is very weak at this time. -PT/OT consulted.   Constipation: LBM 2 days prior to admission. -Scheduled MiraLAX and Senokot-S until she has a good BM   Goal of care counseling-full code with full scope of care full code -See discussion from H&P  Acute thrombocytopenia: Baseline about 170s.  Likely due to gram-negative infection.  Improving. Recent Labs  Lab 12/28/20 1222 12/29/20 0657 12/30/20 0450  PLT 115* 89* 91*  -Continue monitoring. -SCD for VTE prophylaxis   Body mass index is 32.22 kg/m.         DVT prophylaxis:  Place and maintain sequential compression device Start: 12/30/20 1008 Place and maintain sequential compression device Start: 12/29/20 0930  Code Status: Full code Family Communication: Patient and/or RN.  Updated patient's daughter at bedside Level of care: Med-Surg Status is: Inpatient  Remains inpatient appropriate because:Ongoing diagnostic testing needed not appropriate for outpatient work up, IV treatments appropriate due to intensity of illness or inability to take PO, and Inpatient level of care appropriate due to severity of illness  Dispo: The patient is from: Home              Anticipated d/c is to:  To be determined              Patient currently is not medically stable to d/c.   Difficult to place patient  No       Consultants:  Urology Infectious disease   Sch Meds:  Scheduled Meds:  atorvastatin  5 mg Oral Once per day on Mon Wed Fri   Chlorhexidine Gluconate Cloth  6 each Topical Daily   citalopram  20 mg Oral Daily   ezetimibe  10 mg Oral QHS   fluticasone  1 spray Each Nare Daily   fluticasone furoate-vilanterol  1 puff Inhalation QPM   loratadine  10 mg Oral Daily   mouth rinse  15 mL Mouth Rinse BID   pantoprazole  40 mg Oral Daily   polyethylene glycol  17 g Oral BID   polyvinyl alcohol   Both Eyes Daily   tamsulosin  0.8 mg Oral QPC supper   umeclidinium bromide  1 puff Inhalation Daily   Continuous Infusions:  cefTRIAXone (ROCEPHIN)  IV     dextrose 5 % and 0.9% NaCl 100 mL/hr at 12/30/20 0341   PRN Meds:.acetaminophen **OR** acetaminophen, fentaNYL (SUBLIMAZE) injection, fentaNYL (SUBLIMAZE) injection, guaiFENesin-dextromethorphan, hydrALAZINE, ipratropium-albuterol, ondansetron **OR** ondansetron (ZOFRAN) IV  Antimicrobials: Anti-infectives (From admission, onward)    Start  Dose/Rate Route Frequency Ordered Stop   12/30/20 1600  cefTRIAXone (ROCEPHIN) 2 g in sodium chloride 0.9 % 100 mL IVPB        2 g 200 mL/hr over 30 Minutes Intravenous Every 24 hours 12/29/20 1657     12/29/20 1600  ceFEPIme (MAXIPIME) 2 g in sodium chloride 0.9 % 100 mL IVPB  Status:  Discontinued        2 g 200 mL/hr over 30 Minutes Intravenous Every 24 hours 12/28/20 1852 12/28/20 1854   12/29/20 1600  ceFEPIme (MAXIPIME) 1 g in sodium chloride 0.9 % 100 mL IVPB  Status:  Discontinued        1 g 200 mL/hr over 30 Minutes Intravenous Every 24 hours 12/28/20 1854 12/29/20 1131   12/29/20 1600  ceFEPIme (MAXIPIME) 2 g in sodium chloride 0.9 % 100 mL IVPB  Status:  Discontinued        2 g 200 mL/hr over 30 Minutes Intravenous Every 24 hours 12/29/20 1131 12/29/20 1657   12/28/20 1600  vancomycin (VANCOREADY) IVPB 1500 mg/300 mL        1,500 mg 150 mL/hr over 120 Minutes Intravenous   Once 12/28/20 1550 12/28/20 2015   12/28/20 1545  ceFEPIme (MAXIPIME) 2 g in sodium chloride 0.9 % 100 mL IVPB        2 g 200 mL/hr over 30 Minutes Intravenous  Once 12/28/20 1531 12/28/20 1739   12/28/20 1545  vancomycin (VANCOCIN) IVPB 1000 mg/200 mL premix  Status:  Discontinued        1,000 mg 200 mL/hr over 60 Minutes Intravenous  Once 12/28/20 1531 12/28/20 1550   12/28/20 1200  piperacillin-tazobactam (ZOSYN) IVPB 3.375 g  Status:  Discontinued        3.375 g 12.5 mL/hr over 240 Minutes Intravenous  Once 12/28/20 1159 12/28/20 1207        I have personally reviewed the following labs and images: CBC: Recent Labs  Lab 12/28/20 1222 12/28/20 1313 12/29/20 0657 12/30/20 0450  WBC 16.4*  --  8.3 6.5  NEUTROABS 14.4*  --  7.0  --   HGB 13.6 14.3 10.9* 11.3*  HCT 42.2 42.0 33.2* 35.4*  MCV 88.8  --  89.2 89.8  PLT 115*  --  89* 91*   BMP &GFR Recent Labs  Lab 12/28/20 1222 12/28/20 1313 12/29/20 0657 12/30/20 0450  NA 138 135 137 139  K 3.9 3.9 3.5 3.5  CL 98 99 103 109  CO2 23  --  22 26  GLUCOSE 83 78 154* 119*  BUN 90* 86* 81* 69*  CREATININE 4.33* 4.50* 3.03* 2.03*  CALCIUM 8.8*  --  7.8* 8.2*  MG 2.2  --  2.0 2.0  PHOS  --   --  4.1 3.5   Estimated Creatinine Clearance: 23.8 mL/min (A) (by C-G formula based on SCr of 2.03 mg/dL (H)). Liver & Pancreas: Recent Labs  Lab 12/28/20 1222 12/29/20 0657 12/30/20 0450  AST 29  --   --   ALT 32  --   --   ALKPHOS 80  --   --   BILITOT 1.1  --   --   PROT 6.4*  --   --   ALBUMIN 3.1* 2.3* 2.3*   No results for input(s): LIPASE, AMYLASE in the last 168 hours. No results for input(s): AMMONIA in the last 168 hours. Diabetic: No results for input(s): HGBA1C in the last 72 hours. No results for input(s): GLUCAP in the last  168 hours. Cardiac Enzymes: Recent Labs  Lab 12/29/20 0657  CKTOTAL 94   No results for input(s): PROBNP in the last 8760 hours. Coagulation Profile: No results for input(s): INR,  PROTIME in the last 168 hours. Thyroid Function Tests: No results for input(s): TSH, T4TOTAL, FREET4, T3FREE, THYROIDAB in the last 72 hours. Lipid Profile: No results for input(s): CHOL, HDL, LDLCALC, TRIG, CHOLHDL, LDLDIRECT in the last 72 hours. Anemia Panel: No results for input(s): VITAMINB12, FOLATE, FERRITIN, TIBC, IRON, RETICCTPCT in the last 72 hours. Urine analysis:    Component Value Date/Time   COLORURINE YELLOW 12/28/2020 1222   APPEARANCEUR HAZY (A) 12/28/2020 1222   LABSPEC 1.011 12/28/2020 1222   PHURINE 5.0 12/28/2020 1222   GLUCOSEU NEGATIVE 12/28/2020 1222   HGBUR MODERATE (A) 12/28/2020 1222   BILIRUBINUR NEGATIVE 12/28/2020 1222   KETONESUR 5 (A) 12/28/2020 1222   PROTEINUR 100 (A) 12/28/2020 1222   NITRITE NEGATIVE 12/28/2020 1222   LEUKOCYTESUR TRACE (A) 12/28/2020 1222   Sepsis Labs: Invalid input(s): PROCALCITONIN, [place]  Microbiology: Recent Results (from the past 240 hour(s))  Blood culture (routine x 2)     Status: Abnormal (Preliminary result)   Collection Time: 12/28/20  3:37 PM   Specimen: BLOOD LEFT ARM  Result Value Ref Range Status   Specimen Description BLOOD LEFT ARM  Final   Special Requests   Final    BOTTLES DRAWN AEROBIC AND ANAEROBIC Blood Culture adequate volume   Culture  Setup Time   Final    GRAM NEGATIVE RODS ANAEROBIC BOTTLE ONLY CRITICAL RESULT CALLED TO, READ BACK BY AND VERIFIED WITH: AHN PHAM PHARMD @1641  12/29/20 EB    Culture (A)  Final    ESCHERICHIA COLI SUSCEPTIBILITIES TO FOLLOW Performed at [facility], [address], [place], [place] [address]    Report Status PENDING  Incomplete  Blood Culture ID Panel (Reflexed)     Status: Abnormal   Collection Time: 12/28/20  3:37 PM  Result Value Ref Range Status   Enterococcus faecalis NOT DETECTED NOT DETECTED Final   Enterococcus Faecium NOT DETECTED NOT DETECTED Final   Listeria monocytogenes NOT DETECTED NOT DETECTED Final   Staphylococcus species NOT  DETECTED NOT DETECTED Final   Staphylococcus aureus (BCID) NOT DETECTED NOT DETECTED Final   Staphylococcus epidermidis NOT DETECTED NOT DETECTED Final   Staphylococcus lugdunensis NOT DETECTED NOT DETECTED Final   Streptococcus species NOT DETECTED NOT DETECTED Final   Streptococcus agalactiae NOT DETECTED NOT DETECTED Final   Streptococcus pneumoniae NOT DETECTED NOT DETECTED Final   Streptococcus pyogenes NOT DETECTED NOT DETECTED Final   A.calcoaceticus-baumannii NOT DETECTED NOT DETECTED Final   Bacteroides fragilis NOT DETECTED NOT DETECTED Final   Enterobacterales DETECTED (A) NOT DETECTED Final    Comment: Enterobacterales represent a large order of gram negative bacteria, not a single organism. CRITICAL RESULT CALLED TO, READ BACK BY AND VERIFIED WITH: AHN PHAM PHARMD @1641  12/29/20 EB    Enterobacter cloacae complex NOT DETECTED NOT DETECTED Final   Escherichia coli DETECTED (A) NOT DETECTED Final    Comment: CRITICAL RESULT CALLED TO, READ BACK BY AND VERIFIED WITH: AHN PHAM PHARMD @1641  12/29/20 EB    Klebsiella aerogenes NOT DETECTED NOT DETECTED Final   Klebsiella oxytoca NOT DETECTED NOT DETECTED Final   Klebsiella pneumoniae NOT DETECTED NOT DETECTED Final   Proteus species NOT DETECTED NOT DETECTED Final   Salmonella species NOT DETECTED NOT DETECTED Final   Serratia marcescens NOT DETECTED NOT DETECTED Final  Haemophilus influenzae NOT DETECTED NOT DETECTED Final   Neisseria meningitidis NOT DETECTED NOT DETECTED Final   Pseudomonas aeruginosa NOT DETECTED NOT DETECTED Final   Stenotrophomonas maltophilia NOT DETECTED NOT DETECTED Final   Candida albicans NOT DETECTED NOT DETECTED Final   Candida auris NOT DETECTED NOT DETECTED Final   Candida glabrata NOT DETECTED NOT DETECTED Final   Candida krusei NOT DETECTED NOT DETECTED Final   Candida parapsilosis NOT DETECTED NOT DETECTED Final   Candida tropicalis NOT DETECTED NOT DETECTED Final   Cryptococcus  neoformans/gattii NOT DETECTED NOT DETECTED Final   CTX-M ESBL NOT DETECTED NOT DETECTED Final   Carbapenem resistance IMP NOT DETECTED NOT DETECTED Final   Carbapenem resistance KPC NOT DETECTED NOT DETECTED Final   Carbapenem resistance NDM NOT DETECTED NOT DETECTED Final   Carbapenem resist OXA 48 LIKE NOT DETECTED NOT DETECTED Final   Carbapenem resistance VIM NOT DETECTED NOT DETECTED Final    Comment: Performed at [facility], [address], [place], [place] [address]  Blood culture (routine x 2)     Status: None (Preliminary result)   Collection Time: 12/28/20  3:42 PM   Specimen: BLOOD  Result Value Ref Range Status   Specimen Description BLOOD LEFT ANTECUBITAL  Final   Special Requests   Final    BOTTLES DRAWN AEROBIC AND ANAEROBIC Blood Culture adequate volume   Culture  Setup Time   Final    GRAM POSITIVE COCCI ANAEROBIC BOTTLE ONLY CRITICAL RESULT CALLED TO, READ BACK BY AND VERIFIED WITH: L POINDEXTER,PHARMD@0031  12/30/20 [place]    Culture   Final    GRAM POSITIVE COCCI TOO YOUNG TO READ Performed at [facility], [place] [address] [place],  [address]    Report Status PENDING  Incomplete  Blood Culture ID Panel (Reflexed)     Status: Abnormal   Collection Time: 12/28/20  3:42 PM  Result Value Ref Range Status   Enterococcus faecalis NOT DETECTED NOT DETECTED Final   Enterococcus Faecium NOT DETECTED NOT DETECTED Final   Listeria monocytogenes NOT DETECTED NOT DETECTED Final   Staphylococcus species DETECTED (A) NOT DETECTED Final    Comment: CRITICAL RESULT CALLED TO, READ BACK BY AND VERIFIED WITH: L POINDEXTER,PHARM @0031  12/30/20 [place]    Staphylococcus aureus (BCID) NOT DETECTED NOT DETECTED Final   Staphylococcus epidermidis NOT DETECTED NOT DETECTED Final   Staphylococcus lugdunensis DETECTED (A) NOT DETECTED Final    Comment: CRITICAL RESULT CALLED TO, READ BACK BY AND VERIFIED WITH: L POINDEXTER,PHARMD@0033  12/30/20 [place]    Streptococcus  species DETECTED (A) NOT DETECTED Final    Comment: Not Enterococcus species, Streptococcus agalactiae, Streptococcus pyogenes, or Streptococcus pneumoniae. CRITICAL RESULT CALLED TO, READ BACK BY AND VERIFIED WITH: L POINDEXTER,PHARMD@0031  12/30/20 [place]    Streptococcus agalactiae NOT DETECTED NOT DETECTED Final   Streptococcus pneumoniae NOT DETECTED NOT DETECTED Final   Streptococcus pyogenes NOT DETECTED NOT DETECTED Final   A.calcoaceticus-baumannii NOT DETECTED NOT DETECTED Final   Bacteroides fragilis NOT DETECTED NOT DETECTED Final   Enterobacterales NOT DETECTED NOT DETECTED Final   Enterobacter cloacae complex NOT DETECTED NOT DETECTED Final   Escherichia coli NOT DETECTED NOT DETECTED Final   Klebsiella aerogenes NOT DETECTED NOT DETECTED Final   Klebsiella oxytoca NOT DETECTED NOT DETECTED Final   Klebsiella pneumoniae NOT DETECTED NOT DETECTED Final   Proteus species NOT DETECTED NOT DETECTED Final   Salmonella species NOT DETECTED NOT DETECTED Final   Serratia marcescens NOT DETECTED NOT DETECTED Final  Haemophilus influenzae NOT DETECTED NOT DETECTED Final   Neisseria meningitidis NOT DETECTED NOT DETECTED Final   Pseudomonas aeruginosa NOT DETECTED NOT DETECTED Final   Stenotrophomonas maltophilia NOT DETECTED NOT DETECTED Final   Candida albicans NOT DETECTED NOT DETECTED Final   Candida auris NOT DETECTED NOT DETECTED Final   Candida glabrata NOT DETECTED NOT DETECTED Final   Candida krusei NOT DETECTED NOT DETECTED Final   Candida parapsilosis NOT DETECTED NOT DETECTED Final   Candida tropicalis NOT DETECTED NOT DETECTED Final   Cryptococcus neoformans/gattii NOT DETECTED NOT DETECTED Final   Methicillin resistance mecA/C NOT DETECTED NOT DETECTED Final    Comment: Performed at [facility], [place] [address], [place], [place] [address]  Resp Panel by RT-PCR (Flu A&B, Covid) Nasopharyngeal Swab     Status: None   Collection Time: 12/28/20  7:19 PM   Specimen:  Nasopharyngeal Swab; Nasopharyngeal(NP) swabs in vial transport medium  Result Value Ref Range Status   SARS Coronavirus 2 by RT PCR NEGATIVE NEGATIVE Final    Comment: (NOTE) SARS-CoV-2 target nucleic acids are NOT DETECTED.  The SARS-CoV-2 RNA is generally detectable in upper respiratory specimens during the acute phase of infection. The lowest concentration of SARS-CoV-2 viral copies this assay can detect is 138 copies/mL. A negative result does not preclude SARS-Cov-2 infection and should not be used as the sole basis for treatment or other patient management decisions. A negative result may occur with  improper specimen collection/handling, submission of specimen other than nasopharyngeal swab, presence of viral mutation(s) within the areas targeted by this assay, and inadequate number of viral copies(<138 copies/mL). A negative result must be combined with clinical observations, patient history, and epidemiological information. The expected result is Negative.  Fact Sheet for Patients:  EntrepreneurPulse.com.au  Fact Sheet for Healthcare Providers:  IncredibleEmployment.be  This test is no t yet approved or cleared by the [place] FDA and  has been authorized for detection and/or diagnosis of SARS-CoV-2 by FDA under an Emergency Use Authorization (EUA). This EUA will remain  in effect (meaning this test can be used) for the duration of the COVID-19 declaration under Section 564(b)(1) of the Act, 21 U.S.C.section 360bbb-3(b)(1), unless the authorization is terminated  or revoked sooner.       Influenza A by PCR NEGATIVE NEGATIVE Final   Influenza B by PCR NEGATIVE NEGATIVE Final    Comment: (NOTE) The Xpert Xpress SARS-CoV-2/FLU/RSV plus assay is intended as an aid in the diagnosis of influenza from Nasopharyngeal swab specimens and should not be used as a sole basis for treatment. Nasal washings and aspirates are unacceptable for  Xpert Xpress SARS-CoV-2/FLU/RSV testing.  Fact Sheet for Patients: EntrepreneurPulse.com.au  Fact Sheet for Healthcare Providers: IncredibleEmployment.be  This test is not yet approved or cleared by the [place] FDA and has been authorized for detection and/or diagnosis of SARS-CoV-2 by FDA under an Emergency Use Authorization (EUA). This EUA will remain in effect (meaning this test can be used) for the duration of the COVID-19 declaration under Section 564(b)(1) of the Act, 21 U.S.C. section 360bbb-3(b)(1), unless the authorization is terminated or revoked.  Performed at [facility], [place] [address], [place],  [address]     Radiology Studies: DG C-Arm 1-60 Min-No Report  Result Date: 12/30/2020 Fluoroscopy was utilized by the requesting physician.  No radiographic interpretation.      Ashish Rossetti T. [place]  If 7PM-7AM, please contact night-coverage [email or web address] 12/30/2020, 3:28 PM

## 2020-12-30 NOTE — Progress Notes (Signed)
PHARMACY - PHYSICIAN COMMUNICATION CRITICAL VALUE ALERT - BLOOD CULTURE IDENTIFICATION (BCID)  Brandy Morales is an 75 y.o. female who presented to [facility] on 12/28/2020 with a chief complaint of generalized weakness and left back pain. Abdominal CT showed left UPJ calculus and hydronephrosis.  She's currently on cefepime for sepsis/UTI.  On 8/6 one of four blood culture bottles collected on 8/5= GNR (BCID= Ecoli). This AM a second set of the four blood culture bottles collected on 8/5 = Strep species (no organism identified) and Staph Lugdunesis (no methicillin resistance detected)  Name of physician (or Provider) Contacted: Dr Hal Hope  Current antibiotics: Ceftriaxone 2gm IV q24h  Changes to prescribed antibiotics recommended:  Continue Ceftriaxone   Results for orders placed or performed during the hospital encounter of 12/28/20  Blood Culture ID Panel (Reflexed) (Collected: 12/28/2020  3:42 PM)  Result Value Ref Range   Enterococcus faecalis NOT DETECTED NOT DETECTED   Enterococcus Faecium NOT DETECTED NOT DETECTED   Listeria monocytogenes NOT DETECTED NOT DETECTED   Staphylococcus species DETECTED (A) NOT DETECTED   Staphylococcus aureus (BCID) NOT DETECTED NOT DETECTED   Staphylococcus epidermidis NOT DETECTED NOT DETECTED   Staphylococcus lugdunensis DETECTED (A) NOT DETECTED   Streptococcus species DETECTED (A) NOT DETECTED   Streptococcus agalactiae NOT DETECTED NOT DETECTED   Streptococcus pneumoniae NOT DETECTED NOT DETECTED   Streptococcus pyogenes NOT DETECTED NOT DETECTED   A.calcoaceticus-baumannii NOT DETECTED NOT DETECTED   Bacteroides fragilis NOT DETECTED NOT DETECTED   Enterobacterales NOT DETECTED NOT DETECTED   Enterobacter cloacae complex NOT DETECTED NOT DETECTED   Escherichia coli NOT DETECTED NOT DETECTED   Klebsiella aerogenes NOT DETECTED NOT DETECTED   Klebsiella oxytoca NOT DETECTED NOT DETECTED   Klebsiella pneumoniae NOT DETECTED NOT DETECTED    Proteus species NOT DETECTED NOT DETECTED   Salmonella species NOT DETECTED NOT DETECTED   Serratia marcescens NOT DETECTED NOT DETECTED   Haemophilus influenzae NOT DETECTED NOT DETECTED   Neisseria meningitidis NOT DETECTED NOT DETECTED   Pseudomonas aeruginosa NOT DETECTED NOT DETECTED   Stenotrophomonas maltophilia NOT DETECTED NOT DETECTED   Candida albicans NOT DETECTED NOT DETECTED   Candida auris NOT DETECTED NOT DETECTED   Candida glabrata NOT DETECTED NOT DETECTED   Candida krusei NOT DETECTED NOT DETECTED   Candida parapsilosis NOT DETECTED NOT DETECTED   Candida tropicalis NOT DETECTED NOT DETECTED   Cryptococcus neoformans/gattii NOT DETECTED NOT DETECTED   Methicillin resistance mecA/C NOT DETECTED NOT DETECTED    Everette Rank 12/30/2020  12:47 AM

## 2020-12-30 NOTE — Op Note (Signed)
.  Preoperative diagnosis: Left ureteral stone  Postoperative diagnosis: Same  Procedure: 1 cystoscopy 2. Left retrograde pyelography 3.  Intraoperative fluoroscopy, under one hour, with interpretation 4. Left 6 x 26 JJ stent placement  Attending: Nicolette Bang  Anesthesia: General  Estimated blood loss: None  Drains: Left 6 x 26 JJ ureteral stent without tether, 16 French foley catheter  Specimens: urine for culture  Antibiotics: cefepime  Findings: left proximal ureteral stone. Moderate hydronephrosis. No masses/lesions in the bladder. Ureteral orifices in normal anatomic location.  Indications: Patient is a 75 year old female with a history of left ureteral stone and concern for sepsis.  After discussing treatment options, they decided proceed with left stent placement.  Procedure her in detail: The patient was brought to the operating room and a brief timeout was done to ensure correct patient, correct procedure, correct site.  General anesthesia was administered patient was placed in dorsal lithotomy position.  Their genitalia was then prepped and draped in usual sterile fashion.  A rigid 5 French cystoscope was passed in the urethra and the bladder.  Bladder was inspected free masses or lesions.  the ureteral orifices were in the normal orthotopic locations.  a 6 french ureteral catheter was then instilled into the left ureteral orifice.  a gentle retrograde was obtained and findings noted above.  we then placed a zip wire through the ureteral catheter and advanced up to the renal pelvis.    We then placed a 6 x 26 double-j ureteral stent over the original zip wire.  We then removed the wire and good coil was noted in the the renal pelvis under fluoroscopy and the bladder under direct vision.  A foley catheter was then placed. the bladder was then drained and this concluded the procedure which was well tolerated by patient.  Complications: None  Condition: Stable, extubated,  transferred to PACU  Plan: Patient is to be admitted for IV antibiotics. She will have her stone extraction in 2 weeks.

## 2020-12-30 NOTE — Transfer of Care (Signed)
Immediate Anesthesia Transfer of Care Note  Patient: Brandy Morales  Procedure(s) Performed: Procedure(s): CYSTOSCOPY WITH RETROGRADE PYELOGRAM/URETERAL STENT PLACEMENT (Left)  Patient Location: PACU  Anesthesia Type:General  Level of Consciousness: Alert, Awake, Oriented  Airway & Oxygen Therapy: Patient Spontanous Breathing  Post-op Assessment: Report given to RN  Post vital signs: Reviewed and stable  Last Vitals:  Vitals:   12/30/20 0431 12/30/20 0724  BP: 122/68   Pulse: 80   Resp: 20   Temp: 36.5 C   SpO2: 01% 41%    Complications: No apparent anesthesia complications

## 2020-12-30 NOTE — Addendum Note (Signed)
Addendum  created 12/30/20 1118 by Gerald Leitz, CRNA   Flowsheet accepted, Intraprocedure Meds edited

## 2020-12-30 NOTE — Anesthesia Procedure Notes (Signed)
Procedure Name: Intubation Date/Time: 12/30/2020 10:47 AM Performed by: Gerald Leitz, CRNA Pre-anesthesia Checklist: Patient identified, Patient being monitored, Timeout performed, Emergency Drugs available and Suction available Patient Re-evaluated:Patient Re-evaluated prior to induction Oxygen Delivery Method: Circle system utilized Preoxygenation: Pre-oxygenation with 100% oxygen Induction Type: IV induction Ventilation: Mask ventilation without difficulty Laryngoscope Size: Mac and 3 Grade View: Grade I Tube type: Oral Tube size: 7.0 mm Number of attempts: 1 Airway Equipment and Method: Stylet Placement Confirmation: ETT inserted through vocal cords under direct vision, positive ETCO2 and breath sounds checked- equal and bilateral Secured at: 21 cm Tube secured with: Tape Dental Injury: Teeth and Oropharynx as per pre-operative assessment

## 2020-12-31 ENCOUNTER — Encounter (HOSPITAL_COMMUNITY): Payer: Self-pay | Admitting: Urology

## 2020-12-31 ENCOUNTER — Inpatient Hospital Stay (HOSPITAL_COMMUNITY): Payer: PPO

## 2020-12-31 DIAGNOSIS — R7881 Bacteremia: Secondary | ICD-10-CM

## 2020-12-31 DIAGNOSIS — N179 Acute kidney failure, unspecified: Principal | ICD-10-CM

## 2020-12-31 DIAGNOSIS — B958 Unspecified staphylococcus as the cause of diseases classified elsewhere: Secondary | ICD-10-CM | POA: Diagnosis not present

## 2020-12-31 DIAGNOSIS — N390 Urinary tract infection, site not specified: Secondary | ICD-10-CM | POA: Diagnosis not present

## 2020-12-31 DIAGNOSIS — N200 Calculus of kidney: Secondary | ICD-10-CM

## 2020-12-31 DIAGNOSIS — B9620 Unspecified Escherichia coli [E. coli] as the cause of diseases classified elsewhere: Secondary | ICD-10-CM

## 2020-12-31 LAB — CULTURE, BLOOD (ROUTINE X 2): Special Requests: ADEQUATE

## 2020-12-31 LAB — CBC
HCT: 38.1 % (ref 36.0–46.0)
Hemoglobin: 12.1 g/dL (ref 12.0–15.0)
MCH: 28.7 pg (ref 26.0–34.0)
MCHC: 31.8 g/dL (ref 30.0–36.0)
MCV: 90.5 fL (ref 80.0–100.0)
Platelets: 111 10*3/uL — ABNORMAL LOW (ref 150–400)
RBC: 4.21 MIL/uL (ref 3.87–5.11)
RDW: 14.1 % (ref 11.5–15.5)
WBC: 5.6 10*3/uL (ref 4.0–10.5)
nRBC: 0 % (ref 0.0–0.2)

## 2020-12-31 LAB — RENAL FUNCTION PANEL
Albumin: 2.4 g/dL — ABNORMAL LOW (ref 3.5–5.0)
Anion gap: 8 (ref 5–15)
BUN: 54 mg/dL — ABNORMAL HIGH (ref 8–23)
CO2: 25 mmol/L (ref 22–32)
Calcium: 9 mg/dL (ref 8.9–10.3)
Chloride: 110 mmol/L (ref 98–111)
Creatinine, Ser: 1.53 mg/dL — ABNORMAL HIGH (ref 0.44–1.00)
GFR, Estimated: 35 mL/min — ABNORMAL LOW (ref >60)
Glucose, Bld: 140 mg/dL — ABNORMAL HIGH (ref 70–99)
Phosphorus: 3 mg/dL (ref 2.5–4.6)
Potassium: 3.9 mmol/L (ref 3.5–5.1)
Sodium: 143 mmol/L (ref 135–145)

## 2020-12-31 LAB — ECHOCARDIOGRAM COMPLETE
Area-P 1/2: 4.6 cm2
Height: 62 in
S' Lateral: 3 cm
Weight: 2818.36 oz

## 2020-12-31 LAB — MAGNESIUM: Magnesium: 1.9 mg/dL (ref 1.7–2.4)

## 2020-12-31 MED ORDER — ENOXAPARIN SODIUM 40 MG/0.4ML IJ SOSY
40.0000 mg | PREFILLED_SYRINGE | INTRAMUSCULAR | Status: DC
  Administered 2020-12-31 – 2021-01-03 (×4): 40 mg via SUBCUTANEOUS
  Filled 2020-12-31 (×4): qty 0.4

## 2020-12-31 MED ORDER — ALBUTEROL SULFATE (2.5 MG/3ML) 0.083% IN NEBU
2.5000 mg | INHALATION_SOLUTION | Freq: Four times a day (QID) | RESPIRATORY_TRACT | Status: DC
  Filled 2020-12-31 (×2): qty 3

## 2020-12-31 NOTE — Progress Notes (Signed)
  Echocardiogram 2D Echocardiogram has been performed.  Brandy Morales G Brandy Morales 12/31/2020, 1:07 PM

## 2020-12-31 NOTE — Progress Notes (Signed)
VAST consulted to obtain IV access.  Bilateral lower arms assessed utilizing ultrasound. Attempted IV X1 unsuccessfully.  Upper right arm assessed utilizing [place]; one appropriate vessel noted (save for PICC if necessary). Upper left arm assessed utilizing [place]. Successfully started an ultrasound IV in the cephalic vein.  Infectious disease doctor came into room and spoke with patient while VAST RN attempting IV placement. Plan is to follow cultures to determine length of IV abx. Hope that couple of days of IV abx will be sufficient and patient can go home on PO antibiotics. Further studies scheduled for today to help determine plan of care. Notified MD of poor vasculature. Pt would like to avoid PICC if possible.

## 2020-12-31 NOTE — Progress Notes (Signed)
The patient was ordered Albuterol QID nebs due to the fact that she is having audible wheezes and in creased WOB today. She has been given 2 prn neb txs. RT will continue to monitor

## 2020-12-31 NOTE — Progress Notes (Signed)
PROGRESS NOTE  Brandy Morales IFB:[id number] DOB: [birth year]   PCP: [name], MD  Patient is from: Home.  Very independent at baseline.  DOA: 12/28/2020 LOS: 3  Chief complaints:  Chief Complaint  Patient presents with   Weakness     Brief Narrative / Interim history: 74 y.o. 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  She had Foley placed.  Started on BSA, Flomax and IV fluid, and admitted.  AKI improved with IV fluid.  She had ureteral stent placed on 8/7.  Blood culture with E. coli and staph lugdunensis.  ID consulted.  Repeat blood culture and TTE ordered.  ID and urology following.  Subjective: Seen and examined earlier this morning.  No major events overnight of this morning.  She says she just could not sleep well, but nothing in particular.  Feels like she has to catch her breath occasionally.  Denies chest pain, orthopnea, PND, GI or UTI symptoms.  Objective: Vitals:   12/30/20 2159 12/31/20 0352 12/31/20 1134 12/31/20 1316  BP: (!) 159/90 (!) 152/85  114/66  Pulse: 88 81  96  Resp: 20 16  20   Temp: 98.2 F (36.8 C) 98 F (36.7 C)  97.9 F (36.6 C)  TempSrc: Oral     SpO2: 94% 97% 97% 98%  Weight:      Height:        Intake/Output Summary (Last 24 hours) at 12/31/2020 1428 Last data filed at 12/31/2020 1300 Gross per 24 hour  Intake 2352.68 ml  Output --  Net 2352.68 ml   Filed Weights   12/28/20 1115 12/29/20 1537  Weight: 78 kg 79.9 kg    Examination:  GENERAL: No apparent distress.  Nontoxic. HEENT: MMM.  Vision and hearing grossly intact.  NECK: Supple.  No apparent JVD.  RESP: 97% on RA.  No IWOB.   Rhonchorous. CVS:  RRR. Heart sounds normal.  ABD/GI/GU: BS+. Abd soft, NTND.  MSK/EXT:  Moves extremities. No apparent deformity. No edema.  SKIN: no apparent skin lesion or wound NEURO: Awake and alert. Oriented appropriately.  No apparent focal neuro deficit. PSYCH: Calm. Normal affect.   Procedures:  8/7-left ureteral stent by Dr. Alyson Ingles.  Microbiology summarized: 8/5-COVID-19 and influenza PCR nonreactive. 8/5-blood culture with with E. coli in 1 bottle and Staphylococcus lugdunensis and strep mitis/oralis in other 8/7-repeat blood culture pending  Assessment & Plan: AKI/azotemia: B/l Cr 0.82.  Multifactorial-infection, poor p.o. intake, nephrotoxic meds and obstructive uropathy.   CK within normal.  Status post ureteral stent.  Improving. Recent Labs    12/28/20 1222 12/28/20 1313 12/29/20 0657 12/30/20 0450 12/31/20 0415  BUN 90* 86* 81* 69* 54*  CREATININE 4.33* 4.50* 3.03* 2.03* 1.53*  -Continue indwelling Foley catheter -Monitor off IV fluid today -Avoid nephrotoxic meds -Recheck renal function in the morning  Polymicrobial bacteremia-blood culture with E. coli in 1 bottle and Staphylococcus lugdunensis (negative for MecA/C) and strep mitis/oralis in other.  GPC's could be contaminant. -ID following  -Continue IV ceftriaxone  -Follow repeat blood culture and TTE  Complicated UTI-unfortunately, urine sample was not sent for culture although it is marked as collected on 8/5 Left UPJ stone with moderate hydronephrosis-normally follows with Dr. Gloriann Loan -Urology following  -S/p  left ureteral stent by Dr. Alyson Ingles on 8/7 -IV cefepime>> IV ceftriaxone 8/6>>>  Chronic COPD/chronic hypoxic respiratory failure: Feels rhonchorous on exam today -minimum oxygen to keep saturation above 88% despite home 2 L -Continue Breo Ellipta and Incruse Ellipta -Continue DuoNeb as needed -Incentive spirometry/OOB/PT/OT   Nausea and vomiting: Resolved. -Treat AKI, nephrolithiasis and  infection -IV Zofran as needed -Continue IV fluid  Normocytic anemia: H&H stable. Recent Labs    12/28/20 1222 12/28/20 1313 12/29/20 0657 12/30/20 0450 12/31/20 0415  HGB 13.6 14.3 10.9* 11.3* 12.1  -Continue monitoring.  Lactic acidosis: 1.6>> 2.3>> 0.6.  Resolved.   Elevated BNP/troponin-patient has no cardiopulmonary symptoms.  Appears dry on exam.  No history of CHF or CAD.  Likely delayed clearance in the setting of AKI.  -Follow TTE    Essential hypertension: Normotensive off home Hyzaar. 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 -Continue home Celexa   Generalized weakness: Very independent at baseline.  Able to drive herself.  She is very weak at this time. -OOB/PT/OT    Constipation: Seems to have resolved. -MiraLAX and Senokot-S   Goal of care counseling-full code with full scope of care full code -See discussion from H&P  Acute thrombocytopenia: Baseline about 170s.  Likely due to gram-negative infection.  Improving. Recent Labs  Lab 12/28/20 1222 12/29/20 0657 12/30/20 0450 12/31/20 0415  PLT 115* 89* 91* 111*  -Continue monitoring. -Start subcu Lovenox for VTE prophylaxis.   Body mass index is 32.22 kg/m.         DVT prophylaxis:  enoxaparin (LOVENOX) injection 40 mg Start: 12/31/20 0800 Place and maintain sequential compression device Start: 12/30/20 1008 Place and maintain sequential compression device Start: 12/29/20 0930  Code Status: Full code Family Communication: Patient and/or RN.  Updated patient's daughter at bedside Level of care: Med-Surg Status is: Inpatient  Remains inpatient appropriate because:Ongoing diagnostic testing needed not appropriate for outpatient work up, IV treatments appropriate due to intensity of illness or inability to take PO, and Inpatient level of care  appropriate due to severity of illness  Dispo: The patient is from: Home              Anticipated d/c is to:  To be determined              Patient currently is not medically stable to d/c.   Difficult to place patient No       Consultants:  Urology Infectious disease   Sch Meds:  Scheduled Meds:  atorvastatin  5 mg Oral Once per day on Mon Wed Fri   Chlorhexidine Gluconate Cloth  6 each Topical Daily   citalopram  20 mg Oral Daily   enoxaparin (LOVENOX) injection  40 mg Subcutaneous Q24H   ezetimibe  10 mg Oral QHS   fluticasone  1 spray Each Nare Daily   fluticasone furoate-vilanterol  1 puff Inhalation QPM   loratadine  10 mg Oral Daily   mouth rinse  15 mL Mouth Rinse BID   pantoprazole  40 mg Oral Daily   polyethylene glycol  17 g Oral BID   polyvinyl alcohol   Both Eyes Daily   tamsulosin  0.8 mg Oral QPC supper   umeclidinium bromide  1 puff Inhalation Daily   Continuous Infusions:  cefTRIAXone (ROCEPHIN)  IV 2 g (12/30/20 1704)   PRN Meds:.acetaminophen **OR** acetaminophen, fentaNYL (  SUBLIMAZE) injection, fentaNYL (SUBLIMAZE) injection, guaiFENesin-dextromethorphan, hydrALAZINE, ipratropium-albuterol, ondansetron **OR** ondansetron (ZOFRAN) IV  Antimicrobials: Anti-infectives (From admission, onward)    Start     Dose/Rate Route Frequency Ordered Stop   12/30/20 1600  cefTRIAXone (ROCEPHIN) 2 g in sodium chloride 0.9 % 100 mL IVPB        2 g 200 mL/hr over 30 Minutes Intravenous Every 24 hours 12/29/20 1657     12/29/20 1600  ceFEPIme (MAXIPIME) 2 g in sodium chloride 0.9 % 100 mL IVPB  Status:  Discontinued        2 g 200 mL/hr over 30 Minutes Intravenous Every 24 hours 12/28/20 1852 12/28/20 1854   12/29/20 1600  ceFEPIme (MAXIPIME) 1 g in sodium chloride 0.9 % 100 mL IVPB  Status:  Discontinued        1 g 200 mL/hr over 30 Minutes Intravenous Every 24 hours 12/28/20 1854 12/29/20 1131   12/29/20 1600  ceFEPIme (MAXIPIME) 2 g in sodium chloride 0.9 % 100  mL IVPB  Status:  Discontinued        2 g 200 mL/hr over 30 Minutes Intravenous Every 24 hours 12/29/20 1131 12/29/20 1657   12/28/20 1600  vancomycin (VANCOREADY) IVPB 1500 mg/300 mL        1,500 mg 150 mL/hr over 120 Minutes Intravenous  Once 12/28/20 1550 12/28/20 2015   12/28/20 1545  ceFEPIme (MAXIPIME) 2 g in sodium chloride 0.9 % 100 mL IVPB        2 g 200 mL/hr over 30 Minutes Intravenous  Once 12/28/20 1531 12/28/20 1739   12/28/20 1545  vancomycin (VANCOCIN) IVPB 1000 mg/200 mL premix  Status:  Discontinued        1,000 mg 200 mL/hr over 60 Minutes Intravenous  Once 12/28/20 1531 12/28/20 1550   12/28/20 1200  piperacillin-tazobactam (ZOSYN) IVPB 3.375 g  Status:  Discontinued        3.375 g 12.5 mL/hr over 240 Minutes Intravenous  Once 12/28/20 1159 12/28/20 1207        I have personally reviewed the following labs and images: CBC: Recent Labs  Lab 12/28/20 1222 12/28/20 1313 12/29/20 0657 12/30/20 0450 12/31/20 0415  WBC 16.4*  --  8.3 6.5 5.6  NEUTROABS 14.4*  --  7.0  --   --   HGB 13.6 14.3 10.9* 11.3* 12.1  HCT 42.2 42.0 33.2* 35.4* 38.1  MCV 88.8  --  89.2 89.8 90.5  PLT 115*  --  89* 91* 111*   BMP &GFR Recent Labs  Lab 12/28/20 1222 12/28/20 1313 12/29/20 0657 12/30/20 0450 12/31/20 0415  NA 138 135 137 139 143  K 3.9 3.9 3.5 3.5 3.9  CL 98 99 103 109 110  CO2 23  --  22 26 25   GLUCOSE 83 78 154* 119* 140*  BUN 90* 86* 81* 69* 54*  CREATININE 4.33* 4.50* 3.03* 2.03* 1.53*  CALCIUM 8.8*  --  7.8* 8.2* 9.0  MG 2.2  --  2.0 2.0 1.9  PHOS  --   --  4.1 3.5 3.0   Estimated Creatinine Clearance: 31.6 mL/min (A) (by C-G formula based on SCr of 1.53 mg/dL (H)). Liver & Pancreas: Recent Labs  Lab 12/28/20 1222 12/29/20 0657 12/30/20 0450 12/31/20 0415  AST 29  --   --   --   ALT 32  --   --   --   ALKPHOS 80  --   --   --   BILITOT 1.1  --   --   --  PROT 6.4*  --   --   --   ALBUMIN 3.1* 2.3* 2.3* 2.4*   No results for input(s):  LIPASE, AMYLASE in the last 168 hours. No results for input(s): AMMONIA in the last 168 hours. Diabetic: No results for input(s): HGBA1C in the last 72 hours. No results for input(s): GLUCAP in the last 168 hours. Cardiac Enzymes: Recent Labs  Lab 12/29/20 0657  CKTOTAL 94   No results for input(s): PROBNP in the last 8760 hours. Coagulation Profile: No results for input(s): INR, PROTIME in the last 168 hours. Thyroid Function Tests: No results for input(s): TSH, T4TOTAL, FREET4, T3FREE, THYROIDAB in the last 72 hours. Lipid Profile: No results for input(s): CHOL, HDL, LDLCALC, TRIG, CHOLHDL, LDLDIRECT in the last 72 hours. Anemia Panel: No results for input(s): VITAMINB12, FOLATE, FERRITIN, TIBC, IRON, RETICCTPCT in the last 72 hours. Urine analysis:    Component Value Date/Time   COLORURINE YELLOW 12/28/2020 1222   APPEARANCEUR HAZY (A) 12/28/2020 1222   LABSPEC 1.011 12/28/2020 1222   PHURINE 5.0 12/28/2020 1222   GLUCOSEU NEGATIVE 12/28/2020 1222   HGBUR MODERATE (A) 12/28/2020 1222   BILIRUBINUR NEGATIVE 12/28/2020 1222   KETONESUR 5 (A) 12/28/2020 1222   PROTEINUR 100 (A) 12/28/2020 1222   NITRITE NEGATIVE 12/28/2020 1222   LEUKOCYTESUR TRACE (A) 12/28/2020 1222   Sepsis Labs: Invalid input(s): PROCALCITONIN, [place]  Microbiology: Recent Results (from the past 240 hour(s))  Blood culture (routine x 2)     Status: Abnormal   Collection Time: 12/28/20  3:37 PM   Specimen: BLOOD LEFT ARM  Result Value Ref Range Status   Specimen Description BLOOD LEFT ARM  Final   Special Requests   Final    BOTTLES DRAWN AEROBIC AND ANAEROBIC Blood Culture adequate volume   Culture  Setup Time   Final    GRAM NEGATIVE RODS IN BOTH AEROBIC AND ANAEROBIC BOTTLES CRITICAL RESULT CALLED TO, READ BACK BY AND VERIFIED WITH: AHN PHAM PHARMD @1641  12/29/20 EB Performed at [facility], [address] [place], [place] [address]    Culture ESCHERICHIA COLI (A)  Final    Report Status 12/31/2020 FINAL  Final   Organism ID, Bacteria ESCHERICHIA COLI  Final      Susceptibility   Escherichia coli - MIC*    AMPICILLIN >=32 RESISTANT Resistant     CEFAZOLIN 32 INTERMEDIATE Intermediate     CEFEPIME <=0.12 SENSITIVE Sensitive     CEFTAZIDIME 2 SENSITIVE Sensitive     CEFTRIAXONE 1 SENSITIVE Sensitive     CIPROFLOXACIN <=0.25 SENSITIVE Sensitive     GENTAMICIN <=1 SENSITIVE Sensitive     IMIPENEM <=0.25 SENSITIVE Sensitive     TRIMETH/SULFA <=20 SENSITIVE Sensitive     AMPICILLIN/SULBACTAM >=32 RESISTANT Resistant     PIP/TAZO 8 SENSITIVE Sensitive     * ESCHERICHIA COLI  Blood Culture ID Panel (Reflexed)     Status: Abnormal   Collection Time: 12/28/20  3:37 PM  Result Value Ref Range Status   Enterococcus faecalis NOT DETECTED NOT DETECTED Final   Enterococcus Faecium NOT DETECTED NOT DETECTED Final   Listeria monocytogenes NOT DETECTED NOT DETECTED Final   Staphylococcus species NOT DETECTED NOT DETECTED Final   Staphylococcus aureus (BCID) NOT DETECTED NOT DETECTED Final   Staphylococcus epidermidis NOT DETECTED NOT DETECTED Final   Staphylococcus lugdunensis NOT DETECTED NOT DETECTED Final   Streptococcus species NOT DETECTED NOT DETECTED Final   Streptococcus agalactiae NOT DETECTED NOT DETECTED Final   Streptococcus pneumoniae  NOT DETECTED NOT DETECTED Final   Streptococcus pyogenes NOT DETECTED NOT DETECTED Final   A.calcoaceticus-baumannii NOT DETECTED NOT DETECTED Final   Bacteroides fragilis NOT DETECTED NOT DETECTED Final   Enterobacterales DETECTED (A) NOT DETECTED Final    Comment: Enterobacterales represent a large order of gram negative bacteria, not a single organism. CRITICAL RESULT CALLED TO, READ BACK BY AND VERIFIED WITH: AHN PHAM PHARMD @1641  12/29/20 EB    Enterobacter cloacae complex NOT DETECTED NOT DETECTED Final   Escherichia coli DETECTED (A) NOT DETECTED Final    Comment: CRITICAL RESULT CALLED TO, READ BACK BY AND VERIFIED  WITH: AHN PHAM PHARMD @1641  12/29/20 EB    Klebsiella aerogenes NOT DETECTED NOT DETECTED Final   Klebsiella oxytoca NOT DETECTED NOT DETECTED Final   Klebsiella pneumoniae NOT DETECTED NOT DETECTED Final   Proteus species NOT DETECTED NOT DETECTED Final   Salmonella species NOT DETECTED NOT DETECTED Final   Serratia marcescens NOT DETECTED NOT DETECTED Final   Haemophilus influenzae NOT DETECTED NOT DETECTED Final   Neisseria meningitidis NOT DETECTED NOT DETECTED Final   Pseudomonas aeruginosa NOT DETECTED NOT DETECTED Final   Stenotrophomonas maltophilia NOT DETECTED NOT DETECTED Final   Candida albicans NOT DETECTED NOT DETECTED Final   Candida auris NOT DETECTED NOT DETECTED Final   Candida glabrata NOT DETECTED NOT DETECTED Final   Candida krusei NOT DETECTED NOT DETECTED Final   Candida parapsilosis NOT DETECTED NOT DETECTED Final   Candida tropicalis NOT DETECTED NOT DETECTED Final   Cryptococcus neoformans/gattii NOT DETECTED NOT DETECTED Final   CTX-M ESBL NOT DETECTED NOT DETECTED Final   Carbapenem resistance IMP NOT DETECTED NOT DETECTED Final   Carbapenem resistance KPC NOT DETECTED NOT DETECTED Final   Carbapenem resistance NDM NOT DETECTED NOT DETECTED Final   Carbapenem resist OXA 48 LIKE NOT DETECTED NOT DETECTED Final   Carbapenem resistance VIM NOT DETECTED NOT DETECTED Final    Comment: Performed at [facility], [address], [place], [place] [address]  Blood culture (routine x 2)     Status: Abnormal (Preliminary result)   Collection Time: 12/28/20  3:42 PM   Specimen: BLOOD  Result Value Ref Range Status   Specimen Description BLOOD LEFT ANTECUBITAL  Final   Special Requests   Final    BOTTLES DRAWN AEROBIC AND ANAEROBIC Blood Culture adequate volume   Culture  Setup Time   Final    GRAM POSITIVE COCCI ANAEROBIC BOTTLE ONLY CRITICAL RESULT CALLED TO, READ BACK BY AND VERIFIED WITH: L POINDEXTER,PHARMD@0031  12/30/20 [place]    Culture (A)  Final     STAPHYLOCOCCUS LUGDUNENSIS STREPTOCOCCUS MITIS/ORALIS THE SIGNIFICANCE OF ISOLATING THIS ORGANISM FROM A SINGLE VENIPUNCTURE CANNOT BE PREDICTED WITHOUT FURTHER CLINICAL AND CULTURE CORRELATION. SUSCEPTIBILITIES AVAILABLE ONLY ON REQUEST. Performed at [facility], [place] [address] [place], [place] [address]    Report Status PENDING  Incomplete  Blood Culture ID Panel (Reflexed)     Status: Abnormal   Collection Time: 12/28/20  3:42 PM  Result Value Ref Range Status   Enterococcus faecalis NOT DETECTED NOT DETECTED Final   Enterococcus Faecium NOT DETECTED NOT DETECTED Final   Listeria monocytogenes NOT DETECTED NOT DETECTED Final   Staphylococcus species DETECTED (A) NOT DETECTED Final    Comment: CRITICAL RESULT CALLED TO, READ BACK BY AND VERIFIED WITH: L POINDEXTER,PHARM @0031  12/30/20 [place]    Staphylococcus aureus (BCID) NOT DETECTED NOT DETECTED Final   Staphylococcus epidermidis NOT DETECTED NOT DETECTED Final  Staphylococcus lugdunensis DETECTED (A) NOT DETECTED Final    Comment: CRITICAL RESULT CALLED TO, READ BACK BY AND VERIFIED WITH: L POINDEXTER,PHARMD@0033  12/30/20 [place]    Streptococcus species DETECTED (A) NOT DETECTED Final    Comment: Not Enterococcus species, Streptococcus agalactiae, Streptococcus pyogenes, or Streptococcus pneumoniae. CRITICAL RESULT CALLED TO, READ BACK BY AND VERIFIED WITH: L POINDEXTER,PHARMD@0031  12/30/20 [place]    Streptococcus agalactiae NOT DETECTED NOT DETECTED Final   Streptococcus pneumoniae NOT DETECTED NOT DETECTED Final   Streptococcus pyogenes NOT DETECTED NOT DETECTED Final   A.calcoaceticus-baumannii NOT DETECTED NOT DETECTED Final   Bacteroides fragilis NOT DETECTED NOT DETECTED Final   Enterobacterales NOT DETECTED NOT DETECTED Final   Enterobacter cloacae complex NOT DETECTED NOT DETECTED Final   Escherichia coli NOT DETECTED NOT DETECTED Final   Klebsiella aerogenes NOT DETECTED NOT DETECTED Final   Klebsiella oxytoca NOT  DETECTED NOT DETECTED Final   Klebsiella pneumoniae NOT DETECTED NOT DETECTED Final   Proteus species NOT DETECTED NOT DETECTED Final   Salmonella species NOT DETECTED NOT DETECTED Final   Serratia marcescens NOT DETECTED NOT DETECTED Final   Haemophilus influenzae NOT DETECTED NOT DETECTED Final   Neisseria meningitidis NOT DETECTED NOT DETECTED Final   Pseudomonas aeruginosa NOT DETECTED NOT DETECTED Final   Stenotrophomonas maltophilia NOT DETECTED NOT DETECTED Final   Candida albicans NOT DETECTED NOT DETECTED Final   Candida auris NOT DETECTED NOT DETECTED Final   Candida glabrata NOT DETECTED NOT DETECTED Final   Candida krusei NOT DETECTED NOT DETECTED Final   Candida parapsilosis NOT DETECTED NOT DETECTED Final   Candida tropicalis NOT DETECTED NOT DETECTED Final   Cryptococcus neoformans/gattii NOT DETECTED NOT DETECTED Final   Methicillin resistance mecA/C NOT DETECTED NOT DETECTED Final    Comment: Performed at [facility], [address], [place], [place] [address]  Resp Panel by RT-PCR (Flu A&B, Covid) Nasopharyngeal Swab     Status: None   Collection Time: 12/28/20  7:19 PM   Specimen: Nasopharyngeal Swab; Nasopharyngeal(NP) swabs in vial transport medium  Result Value Ref Range Status   SARS Coronavirus 2 by RT PCR NEGATIVE NEGATIVE Final    Comment: (NOTE) SARS-CoV-2 target nucleic acids are NOT DETECTED.  The SARS-CoV-2 RNA is generally detectable in upper respiratory specimens during the acute phase of infection. The lowest concentration of SARS-CoV-2 viral copies this assay can detect is 138 copies/mL. A negative result does not preclude SARS-Cov-2 infection and should not be used as the sole basis for treatment or other patient management decisions. A negative result may occur with  improper specimen collection/handling, submission of specimen other than nasopharyngeal swab, presence of viral mutation(s) within the areas targeted by this assay, and  inadequate number of viral copies(<138 copies/mL). A negative result must be combined with clinical observations, patient history, and epidemiological information. The expected result is Negative.  Fact Sheet for Patients:  EntrepreneurPulse.com.au  Fact Sheet for Healthcare Providers:  IncredibleEmployment.be  This test is no t yet approved or cleared by the [place] FDA and  has been authorized for detection and/or diagnosis of SARS-CoV-2 by FDA under an Emergency Use Authorization (EUA). This EUA will remain  in effect (meaning this test can be used) for the duration of the COVID-19 declaration under Section 564(b)(1) of the Act, 21 U.S.C.section 360bbb-3(b)(1), unless the authorization is terminated  or revoked sooner.       Influenza A by PCR NEGATIVE NEGATIVE Final   Influenza B by PCR NEGATIVE NEGATIVE Final  Comment: (NOTE) The Xpert Xpress SARS-CoV-2/FLU/RSV plus assay is intended as an aid in the diagnosis of influenza from Nasopharyngeal swab specimens and should not be used as a sole basis for treatment. Nasal washings and aspirates are unacceptable for Xpert Xpress SARS-CoV-2/FLU/RSV testing.  Fact Sheet for Patients: EntrepreneurPulse.com.au  Fact Sheet for Healthcare Providers: IncredibleEmployment.be  This test is not yet approved or cleared by the [place] FDA and has been authorized for detection and/or diagnosis of SARS-CoV-2 by FDA under an Emergency Use Authorization (EUA). This EUA will remain in effect (meaning this test can be used) for the duration of the COVID-19 declaration under Section 564(b)(1) of the Act, 21 U.S.C. section 360bbb-3(b)(1), unless the authorization is terminated or revoked.  Performed at [facility], [place] [address] [place], [place] [address]     Radiology Studies: No results found.    Davetta Olliff T. [place]  If 7PM-7AM, please contact night-coverage [email or web address] 12/31/2020, 2:28 PM

## 2020-12-31 NOTE — Progress Notes (Signed)
[facility]  Date of Admission:  12/28/2020           Reason for visit: Follow up on bacteremia  Current antibiotics: Ceftriaxone 8/7--present  Previous antibiotics: Cefepime 8/5--8/7 Vancomycin 8/5--8/7  ASSESSMENT:    Polymicrobial bacteremia with E. coli, streptococcal species, staph lugdunensis: Secondary to urinary tract infection with left UPJ stone and moderate hydronephrosis.  Status post stent placement with urology on 12/30/2020.  Foley catheter remains in place for now. Acute kidney injury: Presented with creatinine 4.33.  This is improved to 1.53 this morning following stent placement.  PLAN:    Continue ceftriaxone 2 g daily Follow-up repeat blood cultures and urine cultures Follow for sensitivities TTE Will follow up with any further urology recommendations regarding definitive stone management Will follow   Principal Problem:   E coli bacteremia Active Problems:   Renal calculi   AKI (acute kidney injury) ([place])   Complicated UTI (urinary tract infection)   Bacterial infection due to Staphylococcus    MEDICATIONS:    Scheduled Meds:  atorvastatin  5 mg Oral Once per day on Mon Wed Fri   Chlorhexidine Gluconate Cloth  6 each Topical Daily   citalopram  20 mg Oral Daily   enoxaparin (LOVENOX) injection  40 mg Subcutaneous Q24H   ezetimibe  10 mg Oral QHS   fluticasone  1 spray Each Nare Daily   fluticasone furoate-vilanterol  1 puff Inhalation QPM   loratadine  10 mg Oral Daily   mouth rinse  15 mL Mouth Rinse BID   pantoprazole  40 mg Oral Daily   polyethylene glycol  17 g Oral BID   polyvinyl alcohol   Both Eyes Daily   tamsulosin  0.8 mg Oral QPC supper   umeclidinium bromide  1 puff Inhalation Daily   Continuous Infusions:  cefTRIAXone (ROCEPHIN)  IV 2 g (12/30/20 1704)   PRN Meds:.acetaminophen **OR** acetaminophen, fentaNYL (SUBLIMAZE) injection, fentaNYL (SUBLIMAZE) injection, guaiFENesin-dextromethorphan,  hydrALAZINE, ipratropium-albuterol, ondansetron **OR** ondansetron (ZOFRAN) IV  SUBJECTIVE:   24 hour events:  No acute events noted overnight Patient seen by ID team today due to difficult access She had a peripheral IV successfully placed in the upper left arm Transthoracic echo scheduled for later today WBC 5.6 Creatinine improving Afebrile, T-max 98.2 Admission blood cultures with E. coli, staph lugdunensis, and Streptococcus species Repeat blood cultures obtained yesterday morning no growth to date  Patient seen this morning at the bedside with her daughter.  She is getting a peripheral IV placed.  She reports that she feels a lot better.  She has no fevers or chills currently.  Discussed plan of care with further studies and follow-up blood cultures to determine antibiotic course.    Review of Systems  All other systems reviewed and are negative.    OBJECTIVE:   Blood pressure (!) 152/85, pulse 81, temperature 98 F (36.7 C), resp. rate 16, height 5\' 2"  (1.575 m), weight 79.9 kg, SpO2 97 %. Body mass index is 32.22 kg/m.  Physical Exam Constitutional:      General: She is not in acute distress.    Appearance: Normal appearance.  HENT:     Head: Normocephalic and atraumatic.  Pulmonary:     Effort: Pulmonary effort is normal. No respiratory distress.  Neurological:     General: No focal deficit present.     Mental Status: She is alert and oriented to person, place, and time.  Psychiatric:  Mood and Affect: Mood normal.        Behavior: Behavior normal.     Lab Results: Lab Results  Component Value Date   WBC 5.6 12/31/2020   HGB 12.1 12/31/2020   HCT 38.1 12/31/2020   MCV 90.5 12/31/2020   PLT 111 (L) 12/31/2020    Lab Results  Component Value Date   NA 143 12/31/2020   K 3.9 12/31/2020   CO2 25 12/31/2020   GLUCOSE 140 (H) 12/31/2020   BUN 54 (H) 12/31/2020   CREATININE 1.53 (H) 12/31/2020   CALCIUM 9.0 12/31/2020   GFRNONAA 35 (L)  12/31/2020   GFRAA >60 11/25/2017    Lab Results  Component Value Date   ALT 32 12/28/2020   AST 29 12/28/2020   ALKPHOS 80 12/28/2020   BILITOT 1.1 12/28/2020    No results found for: CRP     Component Value Date/Time   ESRSEDRATE 8 03/18/2017 1723     I have reviewed the micro and lab results in Epic.  Imaging: DG C-Arm 1-60 Min-No Report  Result Date: 12/30/2020 Fluoroscopy was utilized by the requesting physician.  No radiographic interpretation.     Imaging independently reviewed in Epic.    Raynelle [facility] for Infectious [facility] [phone] pager 12/31/2020, 11:07 AM  I spent greater than 35 minutes with the patient including greater than 50% of time in face to face counsel of the patient and in coordination of their care.

## 2021-01-01 LAB — RENAL FUNCTION PANEL
Albumin: 2.3 g/dL — ABNORMAL LOW (ref 3.5–5.0)
Anion gap: 11 (ref 5–15)
BUN: 46 mg/dL — ABNORMAL HIGH (ref 8–23)
CO2: 24 mmol/L (ref 22–32)
Calcium: 9 mg/dL (ref 8.9–10.3)
Chloride: 109 mmol/L (ref 98–111)
Creatinine, Ser: 1.17 mg/dL — ABNORMAL HIGH (ref 0.44–1.00)
GFR, Estimated: 49 mL/min — ABNORMAL LOW (ref >60)
Glucose, Bld: 96 mg/dL (ref 70–99)
Phosphorus: 2.8 mg/dL (ref 2.5–4.6)
Potassium: 3.4 mmol/L — ABNORMAL LOW (ref 3.5–5.1)
Sodium: 144 mmol/L (ref 135–145)

## 2021-01-01 LAB — CBC
HCT: 37.3 % (ref 36.0–46.0)
Hemoglobin: 11.7 g/dL — ABNORMAL LOW (ref 12.0–15.0)
MCH: 28.6 pg (ref 26.0–34.0)
MCHC: 31.4 g/dL (ref 30.0–36.0)
MCV: 91.2 fL (ref 80.0–100.0)
Platelets: 121 10*3/uL — ABNORMAL LOW (ref 150–400)
RBC: 4.09 MIL/uL (ref 3.87–5.11)
RDW: 14.4 % (ref 11.5–15.5)
WBC: 7.8 10*3/uL (ref 4.0–10.5)
nRBC: 0 % (ref 0.0–0.2)

## 2021-01-01 LAB — CULTURE, BLOOD (ROUTINE X 2): Special Requests: ADEQUATE

## 2021-01-01 LAB — MAGNESIUM: Magnesium: 1.8 mg/dL (ref 1.7–2.4)

## 2021-01-01 MED ORDER — POTASSIUM CHLORIDE CRYS ER 20 MEQ PO TBCR
40.0000 meq | EXTENDED_RELEASE_TABLET | ORAL | Status: AC
  Administered 2021-01-01 (×2): 40 meq via ORAL
  Filled 2021-01-01 (×2): qty 2

## 2021-01-01 MED ORDER — BUDESONIDE 0.5 MG/2ML IN SUSP
0.5000 mg | Freq: Two times a day (BID) | RESPIRATORY_TRACT | Status: DC
  Administered 2021-01-01 – 2021-01-03 (×5): 0.5 mg via RESPIRATORY_TRACT
  Filled 2021-01-01 (×6): qty 2

## 2021-01-01 MED ORDER — CEFDINIR 300 MG PO CAPS
300.0000 mg | ORAL_CAPSULE | Freq: Two times a day (BID) | ORAL | Status: DC
  Administered 2021-01-02 – 2021-01-03 (×3): 300 mg via ORAL
  Filled 2021-01-01 (×3): qty 1

## 2021-01-01 MED ORDER — IPRATROPIUM-ALBUTEROL 0.5-2.5 (3) MG/3ML IN SOLN: 3.0000 mL | RESPIRATORY_TRACT | Status: DC | PRN

## 2021-01-01 MED ORDER — LEVALBUTEROL HCL 0.63 MG/3ML IN NEBU
0.6300 mg | INHALATION_SOLUTION | Freq: Four times a day (QID) | RESPIRATORY_TRACT | Status: DC
  Administered 2021-01-01 – 2021-01-03 (×7): 0.63 mg via RESPIRATORY_TRACT
  Filled 2021-01-01 (×8): qty 3

## 2021-01-01 MED ORDER — IPRATROPIUM BROMIDE 0.02 % IN SOLN
0.5000 mg | Freq: Four times a day (QID) | RESPIRATORY_TRACT | Status: DC
  Administered 2021-01-01 – 2021-01-03 (×7): 0.5 mg via RESPIRATORY_TRACT
  Filled 2021-01-01 (×8): qty 2.5

## 2021-01-01 NOTE — Evaluation (Signed)
Physical Therapy Evaluation Patient Details Name: Brandy Morales MRN: [id number] DOB: 10-29-1945 Today's Date: 01/01/2021   History of Present Illness  75 year old female who presented with weakness, left flank pain and vomitting. patient was found to have UTI, left UPJ stone, moderate hydronephrosis. patient underwent ureteral stent placement on 8/7. PMH: COPD and pulmonary nodules/CA  Clinical Impression  On eval, pt required Min A for mobility. She walked ~15 feet with a RW. Distance limited by dyspnea and fatigue. Remained on 2L [place] O2 during session. Discussed d/c plan-pt would like to return home if at all possible. Family present stated that they can provide assistance as needed. Will plan to follow and progress activity as tolerated.     Follow Up Recommendations Home health PT;Supervision/Assistance - 24 hour (SNF only if pt/family decide they cannot manage at home)    Equipment Recommendations  3in1 (PT); Wheelchair for longer distances?   Recommendations for Other Services       Precautions / Restrictions Precautions Precautions: Fall Precaution Comments: monitor O2- on 2L/min at home Restrictions Weight Bearing Restrictions: No      Mobility  Bed Mobility Overal bed mobility: Needs Assistance Bed Mobility: Supine to Sit     Supine to sit: Min guard    General bed mobility comments: pt was able to roll and push up into sitting. increased time and effort but no physical assistance provided. Dyspnea + wheezing with exertion    Transfers Overall transfer level: Needs assistance Equipment used: Rolling walker (2 wheeled) Transfers: Sit to/from Stand Sit to Stand: Min assist Stand pivot transfers: Min assist       General transfer comment: Assist to rise, steady, control descent. Cues for safety, technique. Dyspnea + wheezing with exertion  Ambulation/Gait Ambulation/Gait assistance: Min assist Gait Distance (Feet): 15 Feet Assistive device: Rolling walker (2  wheeled) Gait Pattern/deviations: Step-through pattern     General Gait Details: Assist to steady throughout short distance. Dyspnea with exertion. Cues for pursed lip breathing.  Stairs            Wheelchair Mobility    Modified Rankin (Stroke Patients Only)       Balance Overall balance assessment: Needs assistance Sitting-balance support: Feet supported Sitting balance-Leahy Scale: Fair     Standing balance support: Bilateral upper extremity supported Standing balance-Leahy Scale: Poor                               Pertinent Vitals/Pain Pain Assessment: No/denies pain    Home Living Family/patient expects to be discharged to:: Private residence Living Arrangements: Alone Available Help at Discharge: Family Type of Home: House Home Access: Level entry     Home Layout: One level Home Equipment: Environmental consultant - 2 wheels (O2)      Prior Function Level of Independence: Independent         Comments: was drivng and going to church days before illness     Hand Dominance   Dominant Hand: Right    Extremity/Trunk Assessment   Upper Extremity Assessment Upper Extremity Assessment: Defer to OT evaluation    Lower Extremity Assessment Lower Extremity Assessment: Generalized weakness    Cervical / Trunk Assessment Cervical / Trunk Assessment: Normal  Communication   Communication: No difficulties  Cognition Arousal/Alertness: Awake/alert Behavior During Therapy: WFL for tasks assessed/performed Overall Cognitive Status: Within Functional Limits for tasks assessed  General Comments      Exercises     Assessment/Plan    PT Assessment Patient needs continued PT services  PT Problem List Decreased strength;Decreased mobility;Decreased activity tolerance;Decreased balance;Decreased knowledge of use of DME       PT Treatment Interventions DME instruction;Gait training;Therapeutic  exercise;Balance training;Functional mobility training;Patient/family education;Therapeutic activities    PT Goals (Current goals can be found in the Care Plan section)  Acute Rehab PT Goals Patient Stated Goal: to get back home PT Goal Formulation: With patient/family Time For Goal Achievement: 01/15/21 Potential to Achieve Goals: Good    Frequency Min 3X/week   Barriers to discharge        Co-evaluation               AM-PAC PT "6 Clicks" Mobility  Outcome Measure Help needed turning from your back to your side while in a flat bed without using bedrails?: A Little Help needed moving from lying on your back to sitting on the side of a flat bed without using bedrails?: A Little Help needed moving to and from a bed to a chair (including a wheelchair)?: A Little Help needed standing up from a chair using your arms (e.g., wheelchair or bedside chair)?: A Little Help needed to walk in hospital room?: A Little Help needed climbing 3-5 steps with a railing? : A Lot 6 Click Score: 17    End of Session Equipment Utilized During Treatment: Gait belt;Oxygen Activity Tolerance: Patient limited by fatigue (limited by dyspnea) Patient left: in chair;with call bell/phone within reach;with chair alarm set;with family/visitor present   PT Visit Diagnosis: Muscle weakness (generalized) (M62.81);Difficulty in walking, not elsewhere classified (R26.2)    Time: 1324-4010 PT Time Calculation (min) (ACUTE ONLY): 25 min   Charges:   PT Evaluation $PT Eval Moderate Complexity: 1 Mod PT Treatments $Gait Training: 8-22 mins          Doreatha Massed, PT Acute Rehabilitation  Office: [phone] Pager: [phone]

## 2021-01-01 NOTE — Progress Notes (Signed)
PROGRESS NOTE  Brandy Morales XNA:[id number] DOB: 08-09-1945   PCP: [name], MD  Patient is from: Home.  Very independent at baseline.  DOA: 12/28/2020 LOS: 4  Chief complaints:  Chief Complaint  Patient presents with   Weakness     Brief Narrative / Interim history: 75 y.o. 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  She had Foley placed.  Started on BSA, Flomax and IV fluid, and admitted.  AKI improved with IV fluid.  She had ureteral stent placed on 8/7.  Blood culture with E. coli and staph lugdunensis.  ID consulted.  Repeat blood culture NGTD.  TTE with normal LVEF and G1 DD but technically difficult study to exclude vegetation. Urology signed off.  Remains on IV ceftriaxone.  ID following.  Subjective: Seen and examined earlier this morning.  She reports having a "peaceful" night.  She has some chest congestion this morning.  She is afraid of using breathing treatments due to anxiety.  She denies chest pain, GI or UTI symptoms.  Objective: Vitals:   12/31/20 2112 01/01/21 0712 01/01/21 0827 01/01/21 1443  BP: (!) 137/57 (!) 150/70    Pulse: 99 100    Resp: 18 20    Temp: 98.2 F (36.8 C) 98.1 F (36.7 C)    TempSrc: Oral Oral    SpO2: 99% 94% 94% 94%  Weight:      Height:        Intake/Output Summary (Last 24 hours) at 01/01/2021 1457 Last data filed at 12/31/2020 2128 Gross per 24 hour  Intake 100 ml  Output --  Net 100 ml   Filed Weights   12/28/20 1115 12/29/20 1537  Weight: 78 kg 79.9 kg    Examination:  GENERAL: No apparent distress.  Nontoxic. HEENT: MMM.  Vision and hearing grossly intact.  NECK: Supple.  No apparent  JVD.  RESP: 98% on 2 L.  Some WOB.  Rhonchi bilaterally. CVS:  RRR. Heart sounds normal.  ABD/GI/GU: BS+. Abd soft, NTND.  MSK/EXT:  Moves extremities. No apparent deformity. No edema.  SKIN: no apparent skin lesion or wound NEURO: Awake and alert. Oriented appropriately.  No apparent focal neuro deficit. PSYCH: Calm. Normal affect.   Procedures:  8/7-left ureteral stent by Dr. Alyson Ingles.  Microbiology summarized: 8/5-COVID-19 and influenza PCR nonreactive. 8/5-blood culture with with E. coli in 1 bottle and Staphylococcus lugdunensis and strep mitis/oralis in other 8/7-repeat blood culture NGTD.  Assessment & Plan: AKI/azotemia: B/l Cr 0.82.  Multifactorial-infection, poor p.o. intake, nephrotoxic meds and obstructive uropathy.   CK within normal.  Status post ureteral stent.  AKI continues to improve off IV fluid. Recent Labs    12/28/20 1222 12/28/20 1313 12/29/20 0657 12/30/20 0450 12/31/20 0415 01/01/21 0425  BUN 90* 86* 81* 69* 54* 46*  CREATININE 4.33* 4.50* 3.03* 2.03* 1.53* 1.17*  -Avoid nephrotoxic meds -Recheck renal function in the morning  Polymicrobial bacteremia-blood culture with E. coli in 1 bottle and Staphylococcus lugdunensis (negative for MecA/C) and strep mitis/oralis in other.  GPC's could be contaminant.  TTE with normal LVEF and G1DD but technically difficult study to exclude vegetation.  Patient is clinically improving. -ID following  -Continue IV ceftriaxone -Follow further ID recommendation  Complicated UTI-unfortunately,  urine sample was not sent for culture although it is marked as collected on 8/5 Left UPJ stone with moderate hydronephrosis-normally follows with Dr. Gloriann Loan -S/p left ureteral stent by Dr. Alyson Ingles on 8/7 -IV cefepime>> IV ceftriaxone 8/6>>> -Urology signed off.  Per urology, has upcoming appointment with Dr. Gloriann Loan.  Chronic COPD/chronic hypoxic respiratory failure: Some chest congestion, work of breathing and rhonchi on exam.  No  increased oxygen requirement. -Minimum oxygen to keep saturation above 88% despite home 2 L -Discontinue Breo and Incruse Ellipta.  Scheduled Xopenex, ipratropium and budesonide -Continue DuoNeb as needed -Incentive spirometry/OOB/PT/OT -Consider short course of systemic steroid if no improvement   Normocytic anemia: H&H stable. Recent Labs    12/28/20 1222 12/28/20 1313 12/29/20 0657 12/30/20 0450 12/31/20 0415 01/01/21 0425  HGB 13.6 14.3 10.9* 11.3* 12.1 11.7*  -Continue monitoring.  Elevated BNP/troponin-patient has no cardiopulmonary symptoms.  Appears dry on exam.  No history of CHF or CAD.  Likely delayed clearance in the setting of AKI.  TTE with normal LVEF and G1 DD.  Lactic acidosis: 1.6>> 2.3>> 0.6.  Resolved.   Nausea and vomiting: Resolved.    Essential hypertension: BP slightly elevated today.  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 -Continue home Celexa   Generalized weakness: Very independent at baseline.  Able to drive herself.  She is very weak at this time. -OOB/PT/OT    Constipation: Seems to have resolved. -MiraLAX and Senokot-S   Goal of care counseling-full code with full scope of care full code -See discussion from H&P  Acute thrombocytopenia: Baseline about 170s.  Likely due to gram-negative infection.  Improving. Recent Labs  Lab 12/28/20 1222 12/29/20 0657 12/30/20 0450 12/31/20 0415 01/01/21 0425  PLT 115* 89* 91* 111* 121*  -Continue monitoring.  Class I obesity Body mass index is 32.22 kg/m.         DVT prophylaxis:  enoxaparin (LOVENOX) injection 40 mg Start: 12/31/20 0800 Place and maintain sequential compression device Start: 12/30/20 1008 Place and maintain sequential compression device Start: 12/29/20 0930  Code Status: Full  code Family Communication: Patient and/or RN.  Updated patient's daughter at bedside Level of care: Med-Surg Status is: Inpatient  Remains inpatient appropriate because:Unsafe d/c plan, IV treatments appropriate due to intensity of illness or inability to take PO, and Inpatient level of care appropriate due to severity of illness  Dispo: The patient is from: Home              Anticipated d/c is to:  SNF              Patient currently is not medically stable to d/c.   Difficult to place patient No       Consultants:  Urology-signed off Infectious disease-following   Sch Meds:  Scheduled Meds:  atorvastatin  5 mg Oral Once per day on Mon Wed Fri   budesonide (PULMICORT) nebulizer solution  0.5 mg Nebulization BID   [START ON 01/02/2021] cefdinir  300 mg Oral Q12H   citalopram  20 mg Oral Daily   enoxaparin (LOVENOX) injection  40 mg Subcutaneous Q24H   ezetimibe  10 mg Oral QHS   fluticasone  1 spray Each Nare Daily   ipratropium  0.5 mg Nebulization Q6H WA   levalbuterol  0.63 mg Nebulization Q6H WA   loratadine  10 mg Oral Daily   mouth rinse  15 mL Mouth Rinse BID   pantoprazole  40 mg Oral Daily   polyethylene glycol  17 g Oral BID   polyvinyl alcohol   Both Eyes Daily   tamsulosin  0.8 mg Oral QPC supper   Continuous Infusions:  cefTRIAXone (ROCEPHIN)  IV 2 g (12/31/20 1521)   PRN Meds:.acetaminophen **OR** acetaminophen, fentaNYL (SUBLIMAZE) injection, fentaNYL (SUBLIMAZE) injection, guaiFENesin-dextromethorphan, hydrALAZINE, ipratropium-albuterol, ondansetron **OR** ondansetron (ZOFRAN) IV  Antimicrobials: Anti-infectives (From admission, onward)    Start     Dose/Rate Route Frequency Ordered Stop   01/02/21 1000  cefdinir (OMNICEF) capsule 300 mg        300 mg Oral Every 12 hours 01/01/21 1418 01/13/21 2359   12/30/20 1600  cefTRIAXone (ROCEPHIN) 2 g in sodium chloride 0.9 % 100 mL IVPB        2 g 200 mL/hr over 30 Minutes Intravenous Every 24 hours 12/29/20  1657 01/01/21 2359   12/29/20 1600  ceFEPIme (MAXIPIME) 2 g in sodium chloride 0.9 % 100 mL IVPB  Status:  Discontinued        2 g 200 mL/hr over 30 Minutes Intravenous Every 24 hours 12/28/20 1852 12/28/20 1854   12/29/20 1600  ceFEPIme (MAXIPIME) 1 g in sodium chloride 0.9 % 100 mL IVPB  Status:  Discontinued        1 g 200 mL/hr over 30 Minutes Intravenous Every 24 hours 12/28/20 1854 12/29/20 1131   12/29/20 1600  ceFEPIme (MAXIPIME) 2 g in sodium chloride 0.9 % 100 mL IVPB  Status:  Discontinued        2 g 200 mL/hr over 30 Minutes Intravenous Every 24 hours 12/29/20 1131 12/29/20 1657   12/28/20 1600  vancomycin (VANCOREADY) IVPB 1500 mg/300 mL        1,500 mg 150 mL/hr over 120 Minutes Intravenous  Once 12/28/20 1550 12/28/20 2015   12/28/20 1545  ceFEPIme (MAXIPIME) 2 g in sodium chloride 0.9 % 100 mL IVPB        2 g 200 mL/hr over 30 Minutes Intravenous  Once 12/28/20 1531 12/28/20 1739   12/28/20 1545  vancomycin (VANCOCIN) IVPB 1000 mg/200 mL premix  Status:  Discontinued        1,000 mg 200 mL/hr over 60 Minutes Intravenous  Once 12/28/20 1531 12/28/20 1550   12/28/20 1200  piperacillin-tazobactam (ZOSYN) IVPB 3.375 g  Status:  Discontinued        3.375 g 12.5 mL/hr over 240 Minutes Intravenous  Once 12/28/20 1159 12/28/20 1207        I have personally reviewed the following labs and images: CBC: Recent Labs  Lab 12/28/20 1222 12/28/20 1313 12/29/20 0657 12/30/20 0450 12/31/20 0415 01/01/21 0425  WBC 16.4*  --  8.3 6.5 5.6 7.8  NEUTROABS 14.4*  --  7.0  --   --   --   HGB 13.6 14.3 10.9* 11.3* 12.1 11.7*  HCT 42.2 42.0 33.2* 35.4* 38.1 37.3  MCV 88.8  --  89.2 89.8 90.5 91.2  PLT 115*  --  89* 91* 111* 121*   BMP &GFR Recent Labs  Lab 12/28/20 1222 12/28/20 1313 12/29/20 0657 12/30/20 0450 12/31/20 0415 01/01/21 0425  NA 138 135 137 139 143 144  K 3.9 3.9 3.5 3.5 3.9 3.4*  CL 98 99 103 109 110 109  CO2 23  --  22 26 25 24   GLUCOSE 83 78 154* 119*  140* 96  BUN 90*  86* 81* 69* 54* 46*  CREATININE 4.33* 4.50* 3.03* 2.03* 1.53* 1.17*  CALCIUM 8.8*  --  7.8* 8.2* 9.0 9.0  MG 2.2  --  2.0 2.0 1.9 1.8  PHOS  --   --  4.1 3.5 3.0 2.8   Estimated Creatinine Clearance: 41.3 mL/min (A) (by C-G formula based on SCr of 1.17 mg/dL (H)). Liver & Pancreas: Recent Labs  Lab 12/28/20 1222 12/29/20 0657 12/30/20 0450 12/31/20 0415 01/01/21 0425  AST 29  --   --   --   --   ALT 32  --   --   --   --   ALKPHOS 80  --   --   --   --   BILITOT 1.1  --   --   --   --   PROT 6.4*  --   --   --   --   ALBUMIN 3.1* 2.3* 2.3* 2.4* 2.3*   No results for input(s): LIPASE, AMYLASE in the last 168 hours. No results for input(s): AMMONIA in the last 168 hours. Diabetic: No results for input(s): HGBA1C in the last 72 hours. No results for input(s): GLUCAP in the last 168 hours. Cardiac Enzymes: Recent Labs  Lab 12/29/20 0657  CKTOTAL 94   No results for input(s): PROBNP in the last 8760 hours. Coagulation Profile: No results for input(s): INR, PROTIME in the last 168 hours. Thyroid Function Tests: No results for input(s): TSH, T4TOTAL, FREET4, T3FREE, THYROIDAB in the last 72 hours. Lipid Profile: No results for input(s): CHOL, HDL, LDLCALC, TRIG, CHOLHDL, LDLDIRECT in the last 72 hours. Anemia Panel: No results for input(s): VITAMINB12, FOLATE, FERRITIN, TIBC, IRON, RETICCTPCT in the last 72 hours. Urine analysis:    Component Value Date/Time   COLORURINE YELLOW 12/28/2020 1222   APPEARANCEUR HAZY (A) 12/28/2020 1222   LABSPEC 1.011 12/28/2020 1222   PHURINE 5.0 12/28/2020 1222   GLUCOSEU NEGATIVE 12/28/2020 1222   HGBUR MODERATE (A) 12/28/2020 1222   BILIRUBINUR NEGATIVE 12/28/2020 1222   KETONESUR 5 (A) 12/28/2020 1222   PROTEINUR 100 (A) 12/28/2020 1222   NITRITE NEGATIVE 12/28/2020 1222   LEUKOCYTESUR TRACE (A) 12/28/2020 1222   Sepsis Labs: Invalid input(s): PROCALCITONIN, [place]  Microbiology: Recent Results (from the  past 240 hour(s))  Blood culture (routine x 2)     Status: Abnormal   Collection Time: 12/28/20  3:37 PM   Specimen: BLOOD LEFT ARM  Result Value Ref Range Status   Specimen Description BLOOD LEFT ARM  Final   Special Requests   Final    BOTTLES DRAWN AEROBIC AND ANAEROBIC Blood Culture adequate volume   Culture  Setup Time   Final    GRAM NEGATIVE RODS IN BOTH AEROBIC AND ANAEROBIC BOTTLES CRITICAL RESULT CALLED TO, READ BACK BY AND VERIFIED WITH: AHN PHAM PHARMD @1641  12/29/20 EB Performed at [facility], [address] [place], [place] [address]    Culture ESCHERICHIA COLI (A)  Final   Report Status 12/31/2020 FINAL  Final   Organism ID, Bacteria ESCHERICHIA COLI  Final      Susceptibility   Escherichia coli - MIC*    AMPICILLIN >=32 RESISTANT Resistant     CEFAZOLIN 32 INTERMEDIATE Intermediate     CEFEPIME <=0.12 SENSITIVE Sensitive     CEFTAZIDIME 2 SENSITIVE Sensitive     CEFTRIAXONE 1 SENSITIVE Sensitive     CIPROFLOXACIN <=0.25 SENSITIVE Sensitive     GENTAMICIN <=1 SENSITIVE Sensitive     IMIPENEM <=0.25 SENSITIVE  Sensitive     TRIMETH/SULFA <=20 SENSITIVE Sensitive     AMPICILLIN/SULBACTAM >=32 RESISTANT Resistant     PIP/TAZO 8 SENSITIVE Sensitive     * ESCHERICHIA COLI  Blood Culture ID Panel (Reflexed)     Status: Abnormal   Collection Time: 12/28/20  3:37 PM  Result Value Ref Range Status   Enterococcus faecalis NOT DETECTED NOT DETECTED Final   Enterococcus Faecium NOT DETECTED NOT DETECTED Final   Listeria monocytogenes NOT DETECTED NOT DETECTED Final   Staphylococcus species NOT DETECTED NOT DETECTED Final   Staphylococcus aureus (BCID) NOT DETECTED NOT DETECTED Final   Staphylococcus epidermidis NOT DETECTED NOT DETECTED Final   Staphylococcus lugdunensis NOT DETECTED NOT DETECTED Final   Streptococcus species NOT DETECTED NOT DETECTED Final   Streptococcus agalactiae NOT DETECTED NOT DETECTED Final   Streptococcus pneumoniae NOT DETECTED NOT  DETECTED Final   Streptococcus pyogenes NOT DETECTED NOT DETECTED Final   A.calcoaceticus-baumannii NOT DETECTED NOT DETECTED Final   Bacteroides fragilis NOT DETECTED NOT DETECTED Final   Enterobacterales DETECTED (A) NOT DETECTED Final    Comment: Enterobacterales represent a large order of gram negative bacteria, not a single organism. CRITICAL RESULT CALLED TO, READ BACK BY AND VERIFIED WITH: AHN PHAM PHARMD @1641  12/29/20 EB    Enterobacter cloacae complex NOT DETECTED NOT DETECTED Final   Escherichia coli DETECTED (A) NOT DETECTED Final    Comment: CRITICAL RESULT CALLED TO, READ BACK BY AND VERIFIED WITH: AHN PHAM PHARMD @1641  12/29/20 EB    Klebsiella aerogenes NOT DETECTED NOT DETECTED Final   Klebsiella oxytoca NOT DETECTED NOT DETECTED Final   Klebsiella pneumoniae NOT DETECTED NOT DETECTED Final   Proteus species NOT DETECTED NOT DETECTED Final   Salmonella species NOT DETECTED NOT DETECTED Final   Serratia marcescens NOT DETECTED NOT DETECTED Final   Haemophilus influenzae NOT DETECTED NOT DETECTED Final   Neisseria meningitidis NOT DETECTED NOT DETECTED Final   Pseudomonas aeruginosa NOT DETECTED NOT DETECTED Final   Stenotrophomonas maltophilia NOT DETECTED NOT DETECTED Final   Candida albicans NOT DETECTED NOT DETECTED Final   Candida auris NOT DETECTED NOT DETECTED Final   Candida glabrata NOT DETECTED NOT DETECTED Final   Candida krusei NOT DETECTED NOT DETECTED Final   Candida parapsilosis NOT DETECTED NOT DETECTED Final   Candida tropicalis NOT DETECTED NOT DETECTED Final   Cryptococcus neoformans/gattii NOT DETECTED NOT DETECTED Final   CTX-M ESBL NOT DETECTED NOT DETECTED Final   Carbapenem resistance IMP NOT DETECTED NOT DETECTED Final   Carbapenem resistance KPC NOT DETECTED NOT DETECTED Final   Carbapenem resistance NDM NOT DETECTED NOT DETECTED Final   Carbapenem resist OXA 48 LIKE NOT DETECTED NOT DETECTED Final   Carbapenem resistance VIM NOT DETECTED NOT  DETECTED Final    Comment: Performed at [facility], [address], [place], [place] [address]  Blood culture (routine x 2)     Status: Abnormal   Collection Time: 12/28/20  3:42 PM   Specimen: BLOOD  Result Value Ref Range Status   Specimen Description BLOOD LEFT ANTECUBITAL  Final   Special Requests   Final    BOTTLES DRAWN AEROBIC AND ANAEROBIC Blood Culture adequate volume   Culture  Setup Time   Final    GRAM POSITIVE COCCI ANAEROBIC BOTTLE ONLY CRITICAL RESULT CALLED TO, READ BACK BY AND VERIFIED WITH: L POINDEXTER,PHARMD@0031  12/30/20 [place]    Culture (A)  Final    STAPHYLOCOCCUS LUGDUNENSIS STREPTOCOCCUS MITIS/ORALIS STAPHYLOCOCCUS CAPITIS THE SIGNIFICANCE OF ISOLATING  THIS ORGANISM FROM A SINGLE SET OF BLOOD CULTURES WHEN MULTIPLE SETS ARE DRAWN IS UNCERTAIN. PLEASE NOTIFY THE MICROBIOLOGY DEPARTMENT WITHIN ONE WEEK IF SPECIATION AND SENSITIVITIES ARE REQUIRED. Performed at [facility], [employer] [address], [place], [place] [address]    Report Status 01/01/2021 FINAL  Final  Blood Culture ID Panel (Reflexed)     Status: Abnormal   Collection Time: 12/28/20  3:42 PM  Result Value Ref Range Status   Enterococcus faecalis NOT DETECTED NOT DETECTED Final   Enterococcus Faecium NOT DETECTED NOT DETECTED Final   Listeria monocytogenes NOT DETECTED NOT DETECTED Final   Staphylococcus species DETECTED (A) NOT DETECTED Final    Comment: CRITICAL RESULT CALLED TO, READ BACK BY AND VERIFIED WITH: L POINDEXTER,PHARM @0031  12/30/20 [place]    Staphylococcus aureus (BCID) NOT DETECTED NOT DETECTED Final   Staphylococcus epidermidis NOT DETECTED NOT DETECTED Final   Staphylococcus lugdunensis DETECTED (A) NOT DETECTED Final    Comment: CRITICAL RESULT CALLED TO, READ BACK BY AND VERIFIED WITH: L POINDEXTER,PHARMD@0033  12/30/20 [place]    Streptococcus species DETECTED (A) NOT DETECTED Final    Comment: Not Enterococcus species, Streptococcus agalactiae, Streptococcus pyogenes, or  Streptococcus pneumoniae. CRITICAL RESULT CALLED TO, READ BACK BY AND VERIFIED WITH: L POINDEXTER,PHARMD@0031  12/30/20 [place]    Streptococcus agalactiae NOT DETECTED NOT DETECTED Final   Streptococcus pneumoniae NOT DETECTED NOT DETECTED Final   Streptococcus pyogenes NOT DETECTED NOT DETECTED Final   A.calcoaceticus-baumannii NOT DETECTED NOT DETECTED Final   Bacteroides fragilis NOT DETECTED NOT DETECTED Final   Enterobacterales NOT DETECTED NOT DETECTED Final   Enterobacter cloacae complex NOT DETECTED NOT DETECTED Final   Escherichia coli NOT DETECTED NOT DETECTED Final   Klebsiella aerogenes NOT DETECTED NOT DETECTED Final   Klebsiella oxytoca NOT DETECTED NOT DETECTED Final   Klebsiella pneumoniae NOT DETECTED NOT DETECTED Final   Proteus species NOT DETECTED NOT DETECTED Final   Salmonella species NOT DETECTED NOT DETECTED Final   Serratia marcescens NOT DETECTED NOT DETECTED Final   Haemophilus influenzae NOT DETECTED NOT DETECTED Final   Neisseria meningitidis NOT DETECTED NOT DETECTED Final   Pseudomonas aeruginosa NOT DETECTED NOT DETECTED Final   Stenotrophomonas maltophilia NOT DETECTED NOT DETECTED Final   Candida albicans NOT DETECTED NOT DETECTED Final   Candida auris NOT DETECTED NOT DETECTED Final   Candida glabrata NOT DETECTED NOT DETECTED Final   Candida krusei NOT DETECTED NOT DETECTED Final   Candida parapsilosis NOT DETECTED NOT DETECTED Final   Candida tropicalis NOT DETECTED NOT DETECTED Final   Cryptococcus neoformans/gattii NOT DETECTED NOT DETECTED Final   Methicillin resistance mecA/C NOT DETECTED NOT DETECTED Final    Comment: Performed at [facility], [address], [place], [place] [address]  Resp Panel by RT-PCR (Flu A&B, Covid) Nasopharyngeal Swab     Status: None   Collection Time: 12/28/20  7:19 PM   Specimen: Nasopharyngeal Swab; Nasopharyngeal(NP) swabs in vial transport medium  Result Value Ref Range Status   SARS Coronavirus 2 by RT PCR  NEGATIVE NEGATIVE Final    Comment: (NOTE) SARS-CoV-2 target nucleic acids are NOT DETECTED.  The SARS-CoV-2 RNA is generally detectable in upper respiratory specimens during the acute phase of infection. The lowest concentration of SARS-CoV-2 viral copies this assay can detect is 138 copies/mL. A negative result does not preclude SARS-Cov-2 infection and should not be used as the sole basis for treatment or other patient management decisions. A negative result may occur with  improper specimen  collection/handling, submission of specimen other than nasopharyngeal swab, presence of viral mutation(s) within the areas targeted by this assay, and inadequate number of viral copies(<138 copies/mL). A negative result must be combined with clinical observations, patient history, and epidemiological information. The expected result is Negative.  Fact Sheet for Patients:  EntrepreneurPulse.com.au  Fact Sheet for Healthcare Providers:  IncredibleEmployment.be  This test is no t yet approved or cleared by the [place] FDA and  has been authorized for detection and/or diagnosis of SARS-CoV-2 by FDA under an Emergency Use Authorization (EUA). This EUA will remain  in effect (meaning this test can be used) for the duration of the COVID-19 declaration under Section 564(b)(1) of the Act, 21 U.S.C.section 360bbb-3(b)(1), unless the authorization is terminated  or revoked sooner.       Influenza A by PCR NEGATIVE NEGATIVE Final   Influenza B by PCR NEGATIVE NEGATIVE Final    Comment: (NOTE) The Xpert Xpress SARS-CoV-2/FLU/RSV plus assay is intended as an aid in the diagnosis of influenza from Nasopharyngeal swab specimens and should not be used as a sole basis for treatment. Nasal washings and aspirates are unacceptable for Xpert Xpress SARS-CoV-2/FLU/RSV testing.  Fact Sheet for Patients: EntrepreneurPulse.com.au  Fact Sheet for  Healthcare Providers: IncredibleEmployment.be  This test is not yet approved or cleared by the [place] FDA and has been authorized for detection and/or diagnosis of SARS-CoV-2 by FDA under an Emergency Use Authorization (EUA). This EUA will remain in effect (meaning this test can be used) for the duration of the COVID-19 declaration under Section 564(b)(1) of the Act, 21 U.S.C. section 360bbb-3(b)(1), unless the authorization is terminated or revoked.  Performed at [facility], [place] [address] [place], [place] [address]   Culture, blood (routine x 2)     Status: None (Preliminary result)   Collection Time: 12/30/20  9:25 AM   Specimen: BLOOD  Result Value Ref Range Status   Specimen Description   Final    BLOOD BLOOD RIGHT FOREARM Performed at [facility] [address], [place], [place] [address]    Special Requests   Final    BOTTLES DRAWN AEROBIC AND ANAEROBIC Blood Culture results may not be optimal due to an inadequate volume of blood received in culture bottles Performed at [facility] [address] [place], [place] [address]    Culture   Final    NO GROWTH 2 DAYS Performed at [facility] [address] [place], [place] [address]    Report Status PENDING  Incomplete  Culture, blood (routine x 2)     Status: None (Preliminary result)   Collection Time: 12/30/20  9:25 AM   Specimen: BLOOD  Result Value Ref Range Status   Specimen Description   Final    BLOOD BLOOD LEFT WRIST Performed at [facility] [address] [place], [place] [address]    Special Requests   Final    BOTTLES DRAWN AEROBIC AND ANAEROBIC Blood Culture adequate volume Performed at [facility] [address] [place], [place] [address]    Culture   Final    NO GROWTH 2 DAYS Performed at [facility] [address] [place], [place] [address]    Report  Status PENDING  Incomplete    Radiology Studies: No results found.    Dvonte Gatliff T. [place]  If 7PM-7AM, please contact night-coverage [email or web address] 01/01/2021, 2:57 PM

## 2021-01-01 NOTE — Plan of Care (Signed)
  Problem: Education: Goal: Knowledge of General Education information will improve Description: Including pain rating scale, medication(s)/side effects and non-pharmacologic comfort measures Outcome: Progressing   Problem: Health Behavior/Discharge Planning: Goal: Ability to manage health-related needs will improve Outcome: Progressing   Problem: Health Behavior/Discharge Planning: Goal: Ability to manage health-related needs will improve Outcome: Progressing   Problem: Clinical Measurements: Goal: Ability to maintain clinical measurements within normal limits will improve Outcome: Progressing Goal: Will remain free from infection Outcome: Progressing

## 2021-01-01 NOTE — Progress Notes (Signed)
Brandy Morales for Infectious Disease  Date of Admission:  12/28/2020           Reason for visit: Follow up on bacteremia  Current antibiotics: Ceftriaxone 8/7-present  Previous antibiotics: Cefepime 8/5-8/7 Vancomycin 8/5-8/7   ASSESSMENT:    E. coli bacteremia: Secondary to urinary tract infection with left UPJ stone and moderate hydronephrosis.  She is status post stent placement with urology on 12/30/2020.  Clinically continuing to improve and has been afebrile over the last 24 hours with normalization of her WBC and significant improvement in her AKI.  In 1 out of 4 blood culture bottles there was polymicrobial growth of strep mitis, staph capitis, and staph lugdunensis.  Staph lugdunensis raises concern given its similar virulence at times as staph aureus.  However, given the polymicrobial cultures and rapid clearance of her bacteremia I suspect this is a contaminant from a difficult blood culture draw in the emergency department at admission.  She underwent TTE yesterday which was a difficult study but did not note any obvious vegetations.   Acute kidney injury: Presented with creatinine 4.33 but has significantly improved down to 1.17 this morning  PLAN:    Continue ceftriaxone through today then transition to cefdinir 300 mg PO every 12 hours to finish her course of therapy.  Recommend 14 days from date of stent placement.  End date equals 01/13/2021. She needs to follow-up with urology as an outpatient for definitive stone management.  Ideally this could be done in this 2-week interval while she is on antibiotics.  However, if procedure will be done after treatment of this infection she can just receive appropriate perioperative antibiotics with something like ceftriaxone. Will sign off, please call as needed.   Principal Problem:   E coli bacteremia Active Problems:   Renal calculi   AKI (acute kidney injury) ([place])   Complicated UTI (urinary tract infection)   Bacterial  infection due to Staphylococcus    MEDICATIONS:    Scheduled Meds:  atorvastatin  5 mg Oral Once per day on Mon Wed Fri   budesonide (PULMICORT) nebulizer solution  0.5 mg Nebulization BID   [START ON 01/02/2021] cefdinir  300 mg Oral Q12H   citalopram  20 mg Oral Daily   enoxaparin (LOVENOX) injection  40 mg Subcutaneous Q24H   ezetimibe  10 mg Oral QHS   fluticasone  1 spray Each Nare Daily   ipratropium  0.5 mg Nebulization Q6H WA   levalbuterol  0.63 mg Nebulization Q6H WA   loratadine  10 mg Oral Daily   mouth rinse  15 mL Mouth Rinse BID   pantoprazole  40 mg Oral Daily   polyethylene glycol  17 g Oral BID   polyvinyl alcohol   Both Eyes Daily   tamsulosin  0.8 mg Oral QPC supper   Continuous Infusions:  cefTRIAXone (ROCEPHIN)  IV 2 g (12/31/20 1521)   PRN Meds:.acetaminophen **OR** acetaminophen, fentaNYL (SUBLIMAZE) injection, fentaNYL (SUBLIMAZE) injection, guaiFENesin-dextromethorphan, hydrALAZINE, ipratropium-albuterol, ondansetron **OR** ondansetron (ZOFRAN) IV  SUBJECTIVE:   24 hour events:  No acute events  Patient feeling better today.  No fevers or chills.  Tolerating antibiotics.  No nausea or vomiting.  Review of Systems  All other systems reviewed and are negative.    OBJECTIVE:   Blood pressure (!) 150/70, pulse 100, temperature 98.1 F (36.7 C), temperature source Oral, resp. rate 20, height 5\' 2"  (1.575 m), weight 79.9 kg, SpO2 94 %. Body mass index is 32.22 kg/m.  Physical Exam Constitutional:      General: She is not in acute distress.    Appearance: Normal appearance.  HENT:     Head: Normocephalic and atraumatic.  Eyes:     Extraocular Movements: Extraocular movements intact.     Conjunctiva/sclera: Conjunctivae normal.  Pulmonary:     Effort: Pulmonary effort is normal. No respiratory distress.  Abdominal:     General: There is no distension.     Palpations: Abdomen is soft.     Tenderness: There is no abdominal tenderness.   Neurological:     General: No focal deficit present.     Mental Status: She is alert and oriented to person, place, and time.  Psychiatric:        Mood and Affect: Mood normal.        Behavior: Behavior normal.     Lab Results: Lab Results  Component Value Date   WBC 7.8 01/01/2021   HGB 11.7 (L) 01/01/2021   HCT 37.3 01/01/2021   MCV 91.2 01/01/2021   PLT 121 (L) 01/01/2021    Lab Results  Component Value Date   NA 144 01/01/2021   K 3.4 (L) 01/01/2021   CO2 24 01/01/2021   GLUCOSE 96 01/01/2021   BUN 46 (H) 01/01/2021   CREATININE 1.17 (H) 01/01/2021   CALCIUM 9.0 01/01/2021   GFRNONAA 49 (L) 01/01/2021   GFRAA >60 11/25/2017    Lab Results  Component Value Date   ALT 32 12/28/2020   AST 29 12/28/2020   ALKPHOS 80 12/28/2020   BILITOT 1.1 12/28/2020    No results found for: CRP     Component Value Date/Time   ESRSEDRATE 8 03/18/2017 1723     I have reviewed the micro and lab results in Epic.  Imaging: ECHOCARDIOGRAM COMPLETE  Result Date: 12/31/2020    ECHOCARDIOGRAM REPORT   Patient Name:   Brandy Morales Date of Exam: 12/31/2020 Medical Rec #:  [id number]      Height:       62.0 in Accession #:    [id number]     Weight:       176.1 lb Date of Birth:  1945/06/30     BSA:          1.811 m Patient Age:    75 years       BP:           152/85 mmHg Patient Gender: F              HR:           102 bpm. Exam Location:  Inpatient Procedure: 2D Echo, Cardiac Doppler and Color Doppler Indications:    Bacteremia R78.81  History:        Patient has no prior history of Echocardiogram examinations.                 COPD; Risk Factors:Hypertension and GERD.  Sonographer:    Tiffany Dance Referring Phys: [id number] [place]  1. Technically difficult; if clinically indicated, TEE would have better sensitivity for vegetation.  2. Left ventricular ejection fraction, by estimation, is 60 to 65%. The left ventricle has normal function. The left ventricle has no regional  wall motion abnormalities. Left ventricular diastolic parameters are consistent with Grade I diastolic dysfunction (impaired relaxation).  3. Right ventricular systolic function is normal. The right ventricular size is normal.  4. The mitral valve is normal in structure. No evidence of mitral valve regurgitation. No evidence of  mitral stenosis.  5. The aortic valve was not well visualized. Aortic valve regurgitation is not visualized. No aortic stenosis is present.  6. The inferior vena cava is normal in size with greater than 50% respiratory variability, suggesting right atrial pressure of 3 mmHg. FINDINGS  Left Ventricle: Left ventricular ejection fraction, by estimation, is 60 to 65%. The left ventricle has normal function. The left ventricle has no regional wall motion abnormalities. The left ventricular internal cavity size was normal in size. There is  no left ventricular hypertrophy. Left ventricular diastolic parameters are consistent with Grade I diastolic dysfunction (impaired relaxation). Right Ventricle: The right ventricular size is normal. Right ventricular systolic function is normal. Left Atrium: Left atrial size was normal in size. Right Atrium: Right atrial size was normal in size. Pericardium: There is no evidence of pericardial effusion. Mitral Valve: The mitral valve is normal in structure. No evidence of mitral valve regurgitation. No evidence of mitral valve stenosis. Tricuspid Valve: The tricuspid valve is normal in structure. Tricuspid valve regurgitation is trivial. No evidence of tricuspid stenosis. Aortic Valve: The aortic valve was not well visualized. Aortic valve regurgitation is not visualized. No aortic stenosis is present. Pulmonic Valve: The pulmonic valve was not well visualized. Pulmonic valve regurgitation is not visualized. No evidence of pulmonic stenosis. Aorta: The aortic root is normal in size and structure. Venous: The inferior vena cava is normal in size with greater than  50% respiratory variability, suggesting right atrial pressure of 3 mmHg. IAS/Shunts: No atrial level shunt detected by color flow Doppler. Additional Comments: Technically difficult; if clinically indicated, TEE would have better sensitivity for vegetation.  LEFT VENTRICLE PLAX 2D LVIDd:         3.80 cm  Diastology LVIDs:         3.00 cm  LV e' medial:    6.96 cm/s LV PW:         1.00 cm  LV E/e' medial:  10.6 LV IVS:        1.00 cm  LV e' lateral:   7.18 cm/s LVOT diam:     2.20 cm  LV E/e' lateral: 10.3 LV SV:         67 LV SV Index:   37 LVOT Area:     3.80 cm  RIGHT VENTRICLE             IVC RV Basal diam:  2.80 cm     IVC diam: 1.85 cm RV S prime:     17.20 cm/s TAPSE (M-mode): 2.0 cm LEFT ATRIUM           Index       RIGHT ATRIUM          Index LA diam:      3.10 cm 1.71 cm/m  RA Area:     8.42 cm LA Vol (A2C): 24.0 ml 13.25 ml/m RA Volume:   15.30 ml 8.45 ml/m LA Vol (A4C): 30.5 ml 16.84 ml/m  AORTIC VALVE LVOT Vmax:   129.00 cm/s LVOT Vmean:  84.400 cm/s LVOT VTI:    0.177 m  AORTA Ao Root diam: 3.50 cm Ao Asc diam:  3.00 cm MITRAL VALVE MV Area (PHT): 4.60 cm    SHUNTS MV Decel Time: 165 msec    Systemic VTI:  0.18 m MV E velocity: 73.80 cm/s  Systemic Diam: 2.20 cm MV A velocity: 83.70 cm/s MV E/A ratio:  0.88 Kirk Ruths MD Electronically signed by Kirk Ruths MD Signature Date/Time: 12/31/2020/2:31:28  PM    Final      Imaging independently reviewed in Epic.    Raynelle Highland for Infectious Disease [facility] [phone] pager 01/01/2021, 3:04 PM  I spent greater than 35 minutes with the patient including greater than 50% of time in face to face counsel of the patient and in coordination of their care.

## 2021-01-01 NOTE — Evaluation (Signed)
Occupational Therapy Evaluation Patient Details Name: Brandy Morales MRN: [id number] DOB: 1946-03-30 Today's Date: 01/01/2021    History of Present Illness patient is a 75 year old female who presented with weakness, left flank pain and vomitting. patient was found to have UTI, left UPJ stone, moderate hydronephrosis. patient underwent ureteral stent placement on 8/7. PMH: COPD and pulmonary nodules/CA   Clinical Impression   Patient is a 75 year old female who was admitted for above. Patient was previously living home alone. Currently patient requires mod A for sit to stand and bed mobility with mod A for LB dressing tasks. Patient was noted to have increased fatigue, decreased activity tolerance, increased shortness of breath, decreased standing balance impacting ability to participate in ADLs. Patient would continue to benefit from skilled OT services at this time while admitted and after d/c to address noted deficits in order to improve overall safety and independence in ADLs.      Follow Up Recommendations  SNF;Supervision/Assistance - 24 hour;Home health OT (family deciding between SNF v.s. home with [facility] and 24/7 care)    Equipment Recommendations  Tub/shower bench    Recommendations for Other Services       Precautions / Restrictions Precautions Precaution Comments: monitor O2- on 2L/min at home Restrictions Weight Bearing Restrictions: No      Mobility Bed Mobility Overal bed mobility: Needs Assistance Bed Mobility: Sit to Supine       Sit to supine: Mod assist        Transfers Overall transfer level: Needs assistance Equipment used: Rolling walker (2 wheeled) Transfers: Sit to/from [facility] Sit to Stand: Mod assist Stand pivot transfers: Mod assist            Balance Overall balance assessment: Needs assistance Sitting-balance support: Feet supported Sitting balance-Leahy Scale: Fair     Standing balance support: Bilateral upper  extremity supported Standing balance-Leahy Scale: Poor                             ADL either performed or assessed with clinical judgement   ADL Overall ADL's : Needs assistance/impaired Eating/Feeding: Set up;Sitting   Grooming: Wash/dry face;Wash/dry hands;Sitting;Set up   Upper Body Bathing: Moderate assistance;Bed level   Lower Body Bathing: Bed level;Moderate assistance   Upper Body Dressing : Minimal assistance;Sitting   Lower Body Dressing: Maximal assistance;Sit to/from stand   Toilet Transfer: Moderate assistance;RW;Stand-pivot   Toileting- Clothing Manipulation and Hygiene: Maximal assistance;Sit to/from stand       Functional mobility during ADLs: Moderate assistance;Rolling walker       Vision Patient Visual Report: No change from baseline       Perception     Praxis      Pertinent Vitals/Pain Pain Assessment: No/denies pain     Hand Dominance Right   Extremity/Trunk Assessment Upper Extremity Assessment Upper Extremity Assessment: Overall WFL for tasks assessed   Lower Extremity Assessment Lower Extremity Assessment: Defer to PT evaluation   Cervical / Trunk Assessment Cervical / Trunk Assessment: Normal   Communication Communication Communication: No difficulties   Cognition Arousal/Alertness: Awake/alert Behavior During Therapy: WFL for tasks assessed/performed Overall Cognitive Status: Within Functional Limits for tasks assessed                                     General Comments       Exercises  Shoulder Instructions      Home Living Family/patient expects to be discharged to:: Private residence Living Arrangements: Alone Available Help at Discharge: Family Type of Home: House Home Access: Level entry     [place]: One level     Bathroom Shower/Tub: Teacher, early years/pre: Standard                Prior Functioning/Environment Level of Independence: Independent         Comments: was drivng and going to church days before illness        OT Problem List: Decreased strength;Decreased activity tolerance;Impaired balance (sitting and/or standing);Decreased coordination;Decreased safety awareness;Decreased knowledge of use of DME or AE      OT Treatment/Interventions: Self-care/ADL training;Therapeutic exercise;Patient/family education;Balance training;Energy conservation;Therapeutic activities    OT Goals(Current goals can be found in the care plan section) Acute Rehab OT Goals Patient Stated Goal: to get back home OT Goal Formulation: With patient Time For Goal Achievement: 01/15/21 Potential to Achieve Goals: Good  OT Frequency: Min 2X/week   Barriers to D/C:    patient lives at home alone       Co-evaluation              AM-PAC OT "6 Clicks" Daily Activity     Outcome Measure Help from another person eating meals?: None Help from another person taking care of personal grooming?: A Little Help from another person toileting, which includes using toliet, bedpan, or urinal?: A Lot Help from another person bathing (including washing, rinsing, drying)?: A Lot Help from another person to put on and taking off regular upper body clothing?: A Little Help from another person to put on and taking off regular lower body clothing?: A Lot 6 Click Score: 16   End of Session Equipment Utilized During Treatment: Gait belt;Rolling walker Nurse Communication: Mobility status  Activity Tolerance: Patient tolerated treatment well Patient left: in bed;with call bell/phone within reach;with family/visitor present  OT Visit Diagnosis: Unsteadiness on feet (R26.81);Muscle weakness (generalized) (M62.81)                Time: 4098-1191 OT Time Calculation (min): 28 min Charges:  OT General Charges $OT Visit: 1 Visit OT Evaluation $OT Eval Low Complexity: 1 Low OT Treatments $Self Care/Home Management : 8-22 mins  Brandy Morales OTR/L, MS Acute  Rehabilitation Department Office# [phone] Pager# [phone]   Brandy Morales 01/01/2021, 12:46 PM

## 2021-01-01 NOTE — Plan of Care (Signed)

## 2021-01-01 NOTE — Progress Notes (Signed)
Patient improving Last blood culture normal Tolerates stent well Have patient f/up with Dr Gloriann Loan as outpt- spoke to patient and family re this thanks

## 2021-01-02 LAB — CBC
HCT: 39.5 % (ref 36.0–46.0)
Hemoglobin: 12.3 g/dL (ref 12.0–15.0)
MCH: 28.5 pg (ref 26.0–34.0)
MCHC: 31.1 g/dL (ref 30.0–36.0)
MCV: 91.4 fL (ref 80.0–100.0)
Platelets: 145 10*3/uL — ABNORMAL LOW (ref 150–400)
RBC: 4.32 MIL/uL (ref 3.87–5.11)
RDW: 14.3 % (ref 11.5–15.5)
WBC: 8.5 10*3/uL (ref 4.0–10.5)
nRBC: 0 % (ref 0.0–0.2)

## 2021-01-02 LAB — RENAL FUNCTION PANEL
Albumin: 2.6 g/dL — ABNORMAL LOW (ref 3.5–5.0)
Anion gap: 8 (ref 5–15)
BUN: 36 mg/dL — ABNORMAL HIGH (ref 8–23)
CO2: 28 mmol/L (ref 22–32)
Calcium: 9.3 mg/dL (ref 8.9–10.3)
Chloride: 111 mmol/L (ref 98–111)
Creatinine, Ser: 1.07 mg/dL — ABNORMAL HIGH (ref 0.44–1.00)
GFR, Estimated: 55 mL/min — ABNORMAL LOW (ref >60)
Glucose, Bld: 103 mg/dL — ABNORMAL HIGH (ref 70–99)
Phosphorus: 3.1 mg/dL (ref 2.5–4.6)
Potassium: 4.3 mmol/L (ref 3.5–5.1)
Sodium: 147 mmol/L — ABNORMAL HIGH (ref 135–145)

## 2021-01-02 LAB — RETICULOCYTES
Immature Retic Fract: 35.7 % — ABNORMAL HIGH (ref 2.3–15.9)
RBC.: 4.4 MIL/uL (ref 3.87–5.11)
Retic Count, Absolute: 37 10*3/uL (ref 19.0–186.0)
Retic Ct Pct: 0.8 % (ref 0.4–3.1)

## 2021-01-02 LAB — FERRITIN: Ferritin: 232 ng/mL (ref 11–307)

## 2021-01-02 LAB — FOLATE: Folate: 20 ng/mL (ref >5.9)

## 2021-01-02 LAB — MAGNESIUM: Magnesium: 1.7 mg/dL (ref 1.7–2.4)

## 2021-01-02 LAB — IRON AND TIBC
Iron: 37 ug/dL (ref 28–170)
Saturation Ratios: 16 % (ref 10.4–31.8)
TIBC: 228 ug/dL — ABNORMAL LOW (ref 250–450)
UIBC: 191 ug/dL

## 2021-01-02 LAB — VITAMIN B12: Vitamin B-12: 1876 pg/mL — ABNORMAL HIGH (ref 180–914)

## 2021-01-02 LAB — BRAIN NATRIURETIC PEPTIDE: B Natriuretic Peptide: 185.2 pg/mL — ABNORMAL HIGH (ref 0.0–100.0)

## 2021-01-02 MED ORDER — MAGNESIUM SULFATE 2 GM/50ML IV SOLN
2.0000 g | Freq: Once | INTRAVENOUS | Status: AC
  Administered 2021-01-02: 2 g via INTRAVENOUS
  Filled 2021-01-02: qty 50

## 2021-01-02 NOTE — Progress Notes (Signed)
Physical Therapy Treatment Patient Details Name: Brandy Morales MRN: [id number] DOB: [birth year] Today's Date: 01/02/2021    History of Present Illness 75 year old female who presented with weakness, left flank pain and vomitting. patient was found to have UTI, left UPJ stone, moderate hydronephrosis. patient underwent ureteral stent placement on 8/7. PMH: COPD and pulmonary nodules/CA    PT Comments    Progressing with mobility. Pt and family prefer to d/c home with home health. O2 95% on 2L during session on today.    Follow Up Recommendations  Home health PT;Supervision/Assistance - 24 hour     Equipment Recommendations   4 wheeled rolling walker (Family stated they have access to a wheelchair if needed.)    Recommendations for Other Services       Precautions / Restrictions Precautions Precautions: Fall Precaution Comments: monitor O2- on 2L/min at home Restrictions Weight Bearing Restrictions: No    Mobility  Bed Mobility               General bed mobility comments: left sitting EOB with NT for hygiene assistance    Transfers Overall transfer level: Needs assistance Equipment used: Rolling walker (2 wheeled) Transfers: Sit to/from Stand Sit to Stand: Min guard;Min assist         General transfer comment: Min guard from recliner; Min assist from rollator. Cues for safety, technqiue, proper use of rollator. Dyspnea + wheezing with exertion  Ambulation/Gait Ambulation/Gait assistance: Min assist Gait Distance (Feet): 35 Feet (x2) Assistive device: Rolling walker (2 wheeled) Gait Pattern/deviations: Step-through pattern;Decreased stride length     General Gait Details: Assist to steady intermittently Dyspnea + wheezing with exertion. Cues for pursed lip breathing. O2 95% on 2L. Seated rest break between walks.   Stairs             Wheelchair Mobility    Modified Rankin (Stroke Patients Only)       Balance Overall balance assessment: Needs  assistance         Standing balance support: Bilateral upper extremity supported Standing balance-Leahy Scale: Poor                              Cognition Arousal/Alertness: Awake/alert Behavior During Therapy: WFL for tasks assessed/performed Overall Cognitive Status: Within Functional Limits for tasks assessed                                        Exercises      General Comments        Pertinent Vitals/Pain Pain Assessment: No/denies pain    Home Living                      Prior Function            PT Goals (current goals can now be found in the care plan section) Progress towards PT goals: Progressing toward goals    Frequency    Min 3X/week      PT Plan Current plan remains appropriate    Co-evaluation              AM-PAC PT "6 Clicks" Mobility   Outcome Measure  Help needed turning from your back to your side while in a flat bed without using bedrails?: A Little Help needed moving from lying on your back to sitting on the  side of a flat bed without using bedrails?: A Little Help needed moving to and from a bed to a chair (including a wheelchair)?: A Little Help needed standing up from a chair using your arms (e.g., wheelchair or bedside chair)?: A Little Help needed to walk in hospital room?: A Little Help needed climbing 3-5 steps with a railing? : A Little 6 Click Score: 18    End of Session Equipment Utilized During Treatment: Oxygen;Gait belt Activity Tolerance: Patient limited by fatigue Patient left: in bed;with call bell/phone within reach;with nursing/sitter in room;with family/visitor present   PT Visit Diagnosis: Muscle weakness (generalized) (M62.81);Difficulty in walking, not elsewhere classified (R26.2)     Time: 4431-5400 PT Time Calculation (min) (ACUTE ONLY): 22 min  Charges:  $Gait Training: 8-22 mins                         Doreatha Massed, PT Acute Rehabilitation  Office:  [phone] Pager: [phone]

## 2021-01-02 NOTE — Care Management Important Message (Signed)
Important Message  Patient Details IM Letter presented to the Patient. Name: Brandy Morales MRN: [id number] Date of Birth: Feb 08, 1946   Medicare Important Message Given:  Yes     Kerin Salen 01/02/2021, 2:13 PM

## 2021-01-02 NOTE — Progress Notes (Signed)
PROGRESS NOTE  Brandy Morales YIR:[id number] DOB: 1945-11-08   PCP: [name], MD  Patient is from: Home.  Very independent at baseline.  DOA: 12/28/2020 LOS: 5  Chief complaints:  Chief Complaint  Patient presents with   Weakness     Brief Narrative / Interim history: 75 y.o. 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    Subjective: No complaints-- discussed with patient and daughter that she needs to get out of bed for meals and to ambulate if she wants to go home  Objective: Vitals:   01/01/21 2043 01/02/21 0114 01/02/21 0436 01/02/21 0757  BP:   (!) 141/78   Pulse:   95   Resp:   20   Temp:   98.1 F (36.7 C)   TempSrc:   Oral   SpO2: 97% 95% 98% 98%  Weight:      Height:        Intake/Output Summary (Last 24 hours) at 01/02/2021 1246 Last data filed at 01/02/2021 1216 Gross per 24 hour  Intake 720 ml  Output 2200 ml  Net -1480 ml   Filed Weights   12/28/20 1115 12/29/20 1537  Weight: 78 kg 79.9 kg    Examination:   General: Appearance:    Obese female in no acute distress     Lungs:     respirations unlabored  Heart:    Normal heart rate.     MS:   All extremities are intact.    Neurologic:   Awake, alert,     Procedures:  8/7-left ureteral stent by Dr. Alyson Ingles.  Microbiology summarized: 8/5-COVID-19 and influenza PCR nonreactive. 8/5-blood culture with with E. coli in 1 bottle and Staphylococcus lugdunensis and strep mitis/oralis in other 8/7-repeat blood culture NGTD.  Assessment & Plan: AKI/azotemia: B/l Cr 0.82.   -much improved  Polymicrobial bacteremia-blood culture with E. coli in 1 bottle and  Staphylococcus lugdunensis (negative for MecA/C) and strep mitis/oralis in other.  GPC's could be contaminant.  TTE with normal LVEF and G1DD but technically difficult study to exclude vegetation.  Patient is clinically improving. -ID following  -plan: cefdinir 300 mg PO every 12 hours to finish her course of therapy.  Recommend 14 days from date of stent placement.  End date equals 01/13/2021.  She needs to follow-up with urology as an outpatient for definitive stone management.  Ideally this could be done in this 2-week interval while she is on antibiotics.  However, if procedure will be done after treatment of this infection she can just receive appropriate perioperative antibiotics with something like ceftriaxone  Complicated UTI/Left UPJ stone with moderate hydronephrosis-normally follows with Dr. Gloriann Loan -S/p left ureteral stent by Dr. Alyson Ingles on 8/7 -see above  Chronic COPD/chronic hypoxic respiratory failure:  -continue home O2- 2L    Elevated BNP/troponin-patient has no cardiopulmonary symptoms.  Appears dry on exam.  No history of CHF or CAD.  Likely delayed clearance in the setting of AKI.  TTE with normal LVEF and G1 DD.  Lactic acidosis: 1.6>> 2.3>> 0.6.  Resolved.   Nausea and vomiting: Resolved.    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 -Continue home Celexa   Generalized weakness: Very independent at baseline.  Able to drive herself.  She is very weak at this time. -OOB/PT/OT  NEEDS OOB FOR ALL MEALS AND TO AMBULATE TID MINIMUM IF PLANS TO GO HOME   Constipation: Seems to have resolved. -MiraLAX and Senokot-S   Class I obesity Body mass index is 32.22 kg/m.         DVT prophylaxis:  enoxaparin (LOVENOX) injection 40 mg Start: 12/31/20 0800 Place and maintain sequential compression device Start: 12/30/20 1008 Place and maintain  sequential compression device Start: 12/29/20 0930  Code Status: Full code Family Communication: Patient and/or RN.  Updated patient's daughter at bedside Level of care: Med-Surg Status is: Inpatient  Remains inpatient appropriate because:Unsafe d/c plan, IV treatments appropriate due to intensity of illness or inability to take PO, and Inpatient level of care appropriate due to severity of illness  Dispo: The patient is from: Home              Anticipated d/c is to: home with help from family              Patient currently is not medically stable to d/c.- home in AM?   Difficult to place patient No       Consultants:  Urology-signed off Infectious disease   Sch Meds:  Scheduled Meds:  atorvastatin  5 mg Oral Once per day on Mon Wed Fri   budesonide (PULMICORT) nebulizer solution  0.5 mg Nebulization BID   cefdinir  300 mg Oral Q12H   citalopram  20 mg Oral Daily   enoxaparin (LOVENOX) injection  40 mg Subcutaneous Q24H   ezetimibe  10 mg Oral QHS   fluticasone  1 spray Each Nare Daily   ipratropium  0.5 mg Nebulization Q6H WA   levalbuterol  0.63 mg Nebulization Q6H WA   loratadine  10 mg Oral Daily   mouth rinse  15 mL Mouth Rinse BID   pantoprazole  40 mg Oral Daily   polyethylene glycol  17 g Oral BID   polyvinyl alcohol   Both Eyes Daily   tamsulosin  0.8 mg Oral QPC supper   Continuous Infusions:  magnesium sulfate bolus IVPB     PRN Meds:.acetaminophen **OR** acetaminophen, fentaNYL (SUBLIMAZE) injection, fentaNYL (SUBLIMAZE) injection, guaiFENesin-dextromethorphan, hydrALAZINE, ipratropium-albuterol, ondansetron **OR** ondansetron (ZOFRAN) IV   I have personally reviewed the following labs and images: CBC: Recent Labs  Lab 12/28/20 1222 12/28/20 1313 12/29/20 0657 12/30/20 0450 12/31/20 0415 01/01/21 0425 01/02/21 0503  WBC 16.4*  --  8.3 6.5 5.6 7.8 8.5  NEUTROABS 14.4*  --  7.0  --   --   --   --   HGB 13.6   < > 10.9* 11.3* 12.1 11.7* 12.3  HCT  42.2   < > 33.2* 35.4* 38.1 37.3 39.5  MCV 88.8  --  89.2 89.8 90.5 91.2 91.4  PLT 115*  --  89* 91* 111* 121* 145*   < > = values in this interval not displayed.   BMP &GFR Recent Labs  Lab 12/29/20 0657 12/30/20 0450 12/31/20 0415 01/01/21 0425 01/02/21 0503  NA 137 139 143 144 147*  K 3.5 3.5 3.9 3.4* 4.3  CL 103 109 110 109 111  CO2 22 26 25 24 28   GLUCOSE 154* 119* 140* 96 103*  BUN 81* 69* 54* 46* 36*  CREATININE 3.03* 2.03* 1.53* 1.17* 1.07*  CALCIUM 7.8* 8.2* 9.0 9.0 9.3  MG 2.0 2.0  1.9 1.8 1.7  PHOS 4.1 3.5 3.0 2.8 3.1   Estimated Creatinine Clearance: 45.1 mL/min (A) (by C-G formula based on SCr of 1.07 mg/dL (H)). Liver & Pancreas: Recent Labs  Lab 12/28/20 1222 12/29/20 0657 12/30/20 0450 12/31/20 0415 01/01/21 0425 01/02/21 0503  AST 29  --   --   --   --   --   ALT 32  --   --   --   --   --   ALKPHOS 80  --   --   --   --   --   BILITOT 1.1  --   --   --   --   --   PROT 6.4*  --   --   --   --   --   ALBUMIN 3.1* 2.3* 2.3* 2.4* 2.3* 2.6*   No results for input(s): LIPASE, AMYLASE in the last 168 hours. No results for input(s): AMMONIA in the last 168 hours. Diabetic: No results for input(s): HGBA1C in the last 72 hours. No results for input(s): GLUCAP in the last 168 hours. Cardiac Enzymes: Recent Labs  Lab 12/29/20 0657  CKTOTAL 94   No results for input(s): PROBNP in the last 8760 hours. Coagulation Profile: No results for input(s): INR, PROTIME in the last 168 hours. Thyroid Function Tests: No results for input(s): TSH, T4TOTAL, FREET4, T3FREE, THYROIDAB in the last 72 hours. Lipid Profile: No results for input(s): CHOL, HDL, LDLCALC, TRIG, CHOLHDL, LDLDIRECT in the last 72 hours. Anemia Panel: Recent Labs    01/02/21 0503  VITAMINB12 1,876*  FOLATE 20.0  FERRITIN 232  TIBC 228*  IRON 37  RETICCTPCT 0.8   Urine analysis:    Component Value Date/Time   COLORURINE YELLOW 12/28/2020 1222   APPEARANCEUR HAZY (A) 12/28/2020 1222    LABSPEC 1.011 12/28/2020 1222   PHURINE 5.0 12/28/2020 1222   GLUCOSEU NEGATIVE 12/28/2020 1222   HGBUR MODERATE (A) 12/28/2020 1222   BILIRUBINUR NEGATIVE 12/28/2020 1222   KETONESUR 5 (A) 12/28/2020 1222   PROTEINUR 100 (A) 12/28/2020 1222   NITRITE NEGATIVE 12/28/2020 1222   LEUKOCYTESUR TRACE (A) 12/28/2020 1222   Sepsis Labs: Invalid input(s): PROCALCITONIN, [place]  Microbiology: Recent Results (from the past 240 hour(s))  Blood culture (routine x 2)     Status: Abnormal   Collection Time: 12/28/20  3:37 PM   Specimen: BLOOD LEFT ARM  Result Value Ref Range Status   Specimen Description BLOOD LEFT ARM  Final   Special Requests   Final    BOTTLES DRAWN AEROBIC AND ANAEROBIC Blood Culture adequate volume   Culture  Setup Time   Final    GRAM NEGATIVE RODS IN BOTH AEROBIC AND ANAEROBIC BOTTLES CRITICAL RESULT CALLED TO, READ BACK BY AND VERIFIED WITH: AHN PHAM PHARMD @1641  12/29/20 EB Performed at [facility], [address] [place], [place] [address]    Culture ESCHERICHIA COLI (A)  Final   Report Status 12/31/2020 FINAL  Final   Organism ID, Bacteria ESCHERICHIA COLI  Final      Susceptibility   Escherichia coli - MIC*    AMPICILLIN >=32 RESISTANT Resistant     CEFAZOLIN 32 INTERMEDIATE Intermediate     CEFEPIME <=0.12 SENSITIVE Sensitive     CEFTAZIDIME 2 SENSITIVE Sensitive     CEFTRIAXONE 1 SENSITIVE Sensitive     CIPROFLOXACIN <=0.25 SENSITIVE Sensitive     GENTAMICIN <=1 SENSITIVE Sensitive     IMIPENEM <=0.25 SENSITIVE Sensitive  TRIMETH/SULFA <=20 SENSITIVE Sensitive     AMPICILLIN/SULBACTAM >=32 RESISTANT Resistant     PIP/TAZO 8 SENSITIVE Sensitive     * ESCHERICHIA COLI  Blood Culture ID Panel (Reflexed)     Status: Abnormal   Collection Time: 12/28/20  3:37 PM  Result Value Ref Range Status   Enterococcus faecalis NOT DETECTED NOT DETECTED Final   Enterococcus Faecium NOT DETECTED NOT DETECTED Final   Listeria monocytogenes NOT  DETECTED NOT DETECTED Final   Staphylococcus species NOT DETECTED NOT DETECTED Final   Staphylococcus aureus (BCID) NOT DETECTED NOT DETECTED Final   Staphylococcus epidermidis NOT DETECTED NOT DETECTED Final   Staphylococcus lugdunensis NOT DETECTED NOT DETECTED Final   Streptococcus species NOT DETECTED NOT DETECTED Final   Streptococcus agalactiae NOT DETECTED NOT DETECTED Final   Streptococcus pneumoniae NOT DETECTED NOT DETECTED Final   Streptococcus pyogenes NOT DETECTED NOT DETECTED Final   A.calcoaceticus-baumannii NOT DETECTED NOT DETECTED Final   Bacteroides fragilis NOT DETECTED NOT DETECTED Final   Enterobacterales DETECTED (A) NOT DETECTED Final    Comment: Enterobacterales represent a large order of gram negative bacteria, not a single organism. CRITICAL RESULT CALLED TO, READ BACK BY AND VERIFIED WITH: AHN PHAM PHARMD @1641  12/29/20 EB    Enterobacter cloacae complex NOT DETECTED NOT DETECTED Final   Escherichia coli DETECTED (A) NOT DETECTED Final    Comment: CRITICAL RESULT CALLED TO, READ BACK BY AND VERIFIED WITH: AHN PHAM PHARMD @1641  12/29/20 EB    Klebsiella aerogenes NOT DETECTED NOT DETECTED Final   Klebsiella oxytoca NOT DETECTED NOT DETECTED Final   Klebsiella pneumoniae NOT DETECTED NOT DETECTED Final   Proteus species NOT DETECTED NOT DETECTED Final   Salmonella species NOT DETECTED NOT DETECTED Final   Serratia marcescens NOT DETECTED NOT DETECTED Final   Haemophilus influenzae NOT DETECTED NOT DETECTED Final   Neisseria meningitidis NOT DETECTED NOT DETECTED Final   Pseudomonas aeruginosa NOT DETECTED NOT DETECTED Final   Stenotrophomonas maltophilia NOT DETECTED NOT DETECTED Final   Candida albicans NOT DETECTED NOT DETECTED Final   Candida auris NOT DETECTED NOT DETECTED Final   Candida glabrata NOT DETECTED NOT DETECTED Final   Candida krusei NOT DETECTED NOT DETECTED Final   Candida parapsilosis NOT DETECTED NOT DETECTED Final   Candida tropicalis NOT  DETECTED NOT DETECTED Final   Cryptococcus neoformans/gattii NOT DETECTED NOT DETECTED Final   CTX-M ESBL NOT DETECTED NOT DETECTED Final   Carbapenem resistance IMP NOT DETECTED NOT DETECTED Final   Carbapenem resistance KPC NOT DETECTED NOT DETECTED Final   Carbapenem resistance NDM NOT DETECTED NOT DETECTED Final   Carbapenem resist OXA 48 LIKE NOT DETECTED NOT DETECTED Final   Carbapenem resistance VIM NOT DETECTED NOT DETECTED Final    Comment: Performed at [facility], [address], [place], [place] [address]  Blood culture (routine x 2)     Status: Abnormal   Collection Time: 12/28/20  3:42 PM   Specimen: BLOOD  Result Value Ref Range Status   Specimen Description BLOOD LEFT ANTECUBITAL  Final   Special Requests   Final    BOTTLES DRAWN AEROBIC AND ANAEROBIC Blood Culture adequate volume   Culture  Setup Time   Final    GRAM POSITIVE COCCI ANAEROBIC BOTTLE ONLY CRITICAL RESULT CALLED TO, READ BACK BY AND VERIFIED WITH: L POINDEXTER,PHARMD@0031  12/30/20 [place]    Culture (A)  Final    STAPHYLOCOCCUS LUGDUNENSIS STREPTOCOCCUS MITIS/ORALIS STAPHYLOCOCCUS CAPITIS THE SIGNIFICANCE OF ISOLATING THIS ORGANISM FROM A SINGLE  SET OF BLOOD CULTURES WHEN MULTIPLE SETS ARE DRAWN IS UNCERTAIN. PLEASE NOTIFY THE MICROBIOLOGY DEPARTMENT WITHIN ONE WEEK IF SPECIATION AND SENSITIVITIES ARE REQUIRED. Performed at [facility], [place] [address], [place], [place] [address]    Report Status 01/01/2021 FINAL  Final  Blood Culture ID Panel (Reflexed)     Status: Abnormal   Collection Time: 12/28/20  3:42 PM  Result Value Ref Range Status   Enterococcus faecalis NOT DETECTED NOT DETECTED Final   Enterococcus Faecium NOT DETECTED NOT DETECTED Final   Listeria monocytogenes NOT DETECTED NOT DETECTED Final   Staphylococcus species DETECTED (A) NOT DETECTED Final    Comment: CRITICAL RESULT CALLED TO, READ BACK BY AND VERIFIED WITH: L POINDEXTER,PHARM @0031  12/30/20 [place]    Staphylococcus  aureus (BCID) NOT DETECTED NOT DETECTED Final   Staphylococcus epidermidis NOT DETECTED NOT DETECTED Final   Staphylococcus lugdunensis DETECTED (A) NOT DETECTED Final    Comment: CRITICAL RESULT CALLED TO, READ BACK BY AND VERIFIED WITH: L POINDEXTER,PHARMD@0033  12/30/20 [place]    Streptococcus species DETECTED (A) NOT DETECTED Final    Comment: Not Enterococcus species, Streptococcus agalactiae, Streptococcus pyogenes, or Streptococcus pneumoniae. CRITICAL RESULT CALLED TO, READ BACK BY AND VERIFIED WITH: L POINDEXTER,PHARMD@0031  12/30/20 [place]    Streptococcus agalactiae NOT DETECTED NOT DETECTED Final   Streptococcus pneumoniae NOT DETECTED NOT DETECTED Final   Streptococcus pyogenes NOT DETECTED NOT DETECTED Final   A.calcoaceticus-baumannii NOT DETECTED NOT DETECTED Final   Bacteroides fragilis NOT DETECTED NOT DETECTED Final   Enterobacterales NOT DETECTED NOT DETECTED Final   Enterobacter cloacae complex NOT DETECTED NOT DETECTED Final   Escherichia coli NOT DETECTED NOT DETECTED Final   Klebsiella aerogenes NOT DETECTED NOT DETECTED Final   Klebsiella oxytoca NOT DETECTED NOT DETECTED Final   Klebsiella pneumoniae NOT DETECTED NOT DETECTED Final   Proteus species NOT DETECTED NOT DETECTED Final   Salmonella species NOT DETECTED NOT DETECTED Final   Serratia marcescens NOT DETECTED NOT DETECTED Final   Haemophilus influenzae NOT DETECTED NOT DETECTED Final   Neisseria meningitidis NOT DETECTED NOT DETECTED Final   Pseudomonas aeruginosa NOT DETECTED NOT DETECTED Final   Stenotrophomonas maltophilia NOT DETECTED NOT DETECTED Final   Candida albicans NOT DETECTED NOT DETECTED Final   Candida auris NOT DETECTED NOT DETECTED Final   Candida glabrata NOT DETECTED NOT DETECTED Final   Candida krusei NOT DETECTED NOT DETECTED Final   Candida parapsilosis NOT DETECTED NOT DETECTED Final   Candida tropicalis NOT DETECTED NOT DETECTED Final   Cryptococcus neoformans/gattii NOT DETECTED NOT  DETECTED Final   Methicillin resistance mecA/C NOT DETECTED NOT DETECTED Final    Comment: Performed at [facility], [address], [place], [place] [address]  Resp Panel by RT-PCR (Flu A&B, Covid) Nasopharyngeal Swab     Status: None   Collection Time: 12/28/20  7:19 PM   Specimen: Nasopharyngeal Swab; Nasopharyngeal(NP) swabs in vial transport medium  Result Value Ref Range Status   SARS Coronavirus 2 by RT PCR NEGATIVE NEGATIVE Final    Comment: (NOTE) SARS-CoV-2 target nucleic acids are NOT DETECTED.  The SARS-CoV-2 RNA is generally detectable in upper respiratory specimens during the acute phase of infection. The lowest concentration of SARS-CoV-2 viral copies this assay can detect is 138 copies/mL. A negative result does not preclude SARS-Cov-2 infection and should not be used as the sole basis for treatment or other patient management decisions. A negative result may occur with  improper specimen collection/handling, submission of specimen other  than nasopharyngeal swab, presence of viral mutation(s) within the areas targeted by this assay, and inadequate number of viral copies(<138 copies/mL). A negative result must be combined with clinical observations, patient history, and epidemiological information. The expected result is Negative.  Fact Sheet for Patients:  EntrepreneurPulse.com.au  Fact Sheet for Healthcare Providers:  IncredibleEmployment.be  This test is no t yet approved or cleared by the [place] FDA and  has been authorized for detection and/or diagnosis of SARS-CoV-2 by FDA under an Emergency Use Authorization (EUA). This EUA will remain  in effect (meaning this test can be used) for the duration of the COVID-19 declaration under Section 564(b)(1) of the Act, 21 U.S.C.section 360bbb-3(b)(1), unless the authorization is terminated  or revoked sooner.       Influenza A by PCR NEGATIVE NEGATIVE Final    Influenza B by PCR NEGATIVE NEGATIVE Final    Comment: (NOTE) The Xpert Xpress SARS-CoV-2/FLU/RSV plus assay is intended as an aid in the diagnosis of influenza from Nasopharyngeal swab specimens and should not be used as a sole basis for treatment. Nasal washings and aspirates are unacceptable for Xpert Xpress SARS-CoV-2/FLU/RSV testing.  Fact Sheet for Patients: EntrepreneurPulse.com.au  Fact Sheet for Healthcare Providers: IncredibleEmployment.be  This test is not yet approved or cleared by the [place] FDA and has been authorized for detection and/or diagnosis of SARS-CoV-2 by FDA under an Emergency Use Authorization (EUA). This EUA will remain in effect (meaning this test can be used) for the duration of the COVID-19 declaration under Section 564(b)(1) of the Act, 21 U.S.C. section 360bbb-3(b)(1), unless the authorization is terminated or revoked.  Performed at [facility], [place] [address], [place], [place] [address]   Culture, blood (routine x 2)     Status: None (Preliminary result)   Collection Time: 12/30/20  9:25 AM   Specimen: BLOOD  Result Value Ref Range Status   Specimen Description   Final    BLOOD BLOOD RIGHT FOREARM Performed at [place] [address] [place], [place] [address]    Special Requests   Final    BOTTLES DRAWN AEROBIC AND ANAEROBIC Blood Culture results may not be optimal due to an inadequate volume of blood received in culture bottles Performed at [place] [address], [place], [place] [address]    Culture   Final    NO GROWTH 3 DAYS Performed at [facility], [place] [address] [place], [place] [address]    Report Status PENDING  Incomplete  Culture, blood (routine x 2)     Status: None (Preliminary result)   Collection Time: 12/30/20  9:25 AM   Specimen: BLOOD  Result Value Ref Range Status   Specimen Description   Final     BLOOD BLOOD LEFT WRIST Performed at [place] [address] [place], [place] [address]    Special Requests   Final    BOTTLES DRAWN AEROBIC AND ANAEROBIC Blood Culture adequate volume Performed at [place] [address], [place], [place] [address]    Culture   Final    NO GROWTH 3 DAYS Performed at [facility], [place] [address], [place],  [address]    Report Status PENDING  Incomplete    Radiology Studies: No results found.    Eulogio Bear DO [facility]  If 7PM-7AM, please contact night-coverage [email or web address] 01/02/2021, 12:46 PM

## 2021-01-02 NOTE — TOC Initial Note (Signed)
Transition of Care [facility]) - Initial/Assessment Note    Patient Details  Name: Brandy Morales MRN: [id number] Date of Birth: [birth year]  [facility] CM/SW Contact:    Dessa Phi, RN Phone Number: 01/02/2021, 2:23 PM  Clinical Narrative:  spoke to dtr mary per patient permission-declines SNF wants home w/HHC-has family support-[facility] rep Tommi Rumps following;[facility] dme rep Thedore Mins following for transfer tub bench,3n1,rollator. Family to transport home.                 Expected Discharge Plan: [place] Barriers to Discharge: Continued Medical Work up   Patient Goals and CMS Choice Patient states their goals for this hospitalization and ongoing recovery are:: go home CMS Medicare.gov Compare Post Acute Care list provided to:: Patient Represenative (must comment) (dtr [facility]) Choice offered to / list presented to : Adult Children  Expected Discharge Plan and Services Expected Discharge Plan: [place]   Discharge Planning Services: CM Consult Post Acute Care Choice: Durable Medical Equipment, Home Health Living arrangements for the past 2 months: Single Family Home                 DME Arranged: 3-N-1, Tub bench, Walker rolling with seat DME Agency: [facility] Date DME Agency Contacted: 01/02/21 Time DME Agency Contacted: [id number] Representative spoke with at DME Agency: Thedore Mins HH Arranged: OT, PT [place] Agency: [place] Date [place]: 01/02/21 Time [place]: 30 Representative spoke with at [place]: [place] Arrangements/Services Living arrangements for the past 2 months: [place] Lives with:: Adult Children Patient language and need for interpreter reviewed:: Yes Do you feel safe going back to the place where you live?: Yes      Need for Family Participation in Patient Care: No (Comment) Care giver support system in place?: Yes (comment) Current home services: DME  (rw) Criminal Activity/Legal Involvement Pertinent to Current Situation/Hospitalization: No - Comment as needed  Activities of Daily Living Home Assistive Devices/Equipment: Nebulizer, Oxygen, Other (Comment) (pulse oximeter) ADL Screening (condition at time of admission) Patient's cognitive ability adequate to safely complete daily activities?: Yes Is the patient deaf or have difficulty hearing?: No Does the patient have difficulty seeing, even when wearing glasses/contacts?: No Does the patient have difficulty concentrating, remembering, or making decisions?: No Patient able to express need for assistance with ADLs?: Yes Does the patient have difficulty dressing or bathing?: No Independently performs ADLs?: Yes (appropriate for developmental age) Does the patient have difficulty walking or climbing stairs?: Yes (secondary to weakness) Weakness of Legs: Both Weakness of Arms/Hands: None  Permission Sought/Granted Permission sought to share information with : Case Manager Permission granted to share information with : Yes, Verbal Permission Granted  Share Information with NAME: Case Manager           Emotional Assessment Appearance:: Appears stated age   Affect (typically observed): Accepting Orientation: : Oriented to Self, Oriented to Place, Oriented to  Time, Oriented to Situation Alcohol / Substance Use: Not Applicable Psych Involvement: No (comment)  Admission diagnosis:  Dehydration [E86.0] Generalized weakness [R53.1] AKI (acute kidney injury) ([place]) [N17.9] Patient Active Problem List   Diagnosis Date Noted   Complicated UTI (urinary tract infection) 12/31/2020   E coli bacteremia 12/31/2020   Bacterial infection due to Staphylococcus 12/31/2020   AKI (acute kidney injury) ([place]) 12/28/2020   Renal calculi 11/30/2017   Chronic obstructive pulmonary disease ([place]) 10/22/2017   Chronic respiratory failure with  hypoxia ([place]) 10/22/2017   Pulmonary nodules 10/22/2017    Preoperative clearance 10/22/2017   Dyspnea on exertion 03/18/2017   History of smoking greater than 50 pack years 03/18/2017   Hypoxemia 03/18/2017   PCP:  [name], MD Pharmacy:   [facility] - [place], [place] - [address] [place] [address] Phone: [phone] Fax: [phone]     Social Determinants of Health (SDOH) Interventions    Readmission Risk Interventions No flowsheet data found.

## 2021-01-03 MED ORDER — LIDOCAINE 5 % EX PTCH
1.0000 | MEDICATED_PATCH | CUTANEOUS | Status: DC
  Filled 2021-01-03: qty 1

## 2021-01-03 MED ORDER — IPRATROPIUM BROMIDE 0.02 % IN SOLN: 0.5000 mg | Freq: Two times a day (BID) | RESPIRATORY_TRACT | Status: DC

## 2021-01-03 MED ORDER — LIDOCAINE 5 % EX PTCH: 1.0000 | MEDICATED_PATCH | CUTANEOUS | 0 refills | Status: DC

## 2021-01-03 MED ORDER — LOSARTAN POTASSIUM-HCTZ 100-25 MG PO TABS: 0.5000 | ORAL_TABLET | Freq: Every day | ORAL | Status: DC

## 2021-01-03 MED ORDER — ACYCLOVIR 400 MG PO TABS: 400.0000 mg | ORAL_TABLET | Freq: Once | ORAL | Status: DC

## 2021-01-03 MED ORDER — LEVALBUTEROL HCL 0.63 MG/3ML IN NEBU: 0.6300 mg | INHALATION_SOLUTION | Freq: Two times a day (BID) | RESPIRATORY_TRACT | Status: DC

## 2021-01-03 MED ORDER — ACYCLOVIR 400 MG PO TABS: 400.0000 mg | ORAL_TABLET | Freq: Three times a day (TID) | ORAL | 0 refills | Status: DC

## 2021-01-03 MED ORDER — CEFDINIR 300 MG PO CAPS: 300.0000 mg | ORAL_CAPSULE | Freq: Two times a day (BID) | ORAL | 0 refills | Status: AC

## 2021-01-03 NOTE — Progress Notes (Signed)
Occupational Therapy Treatment Patient Details Name: Brandy Morales MRN: [id number] DOB: 1946-02-22 Today's Date: 01/03/2021    History of present illness 75 year old female who presented with weakness, left flank pain and vomitting. patient was found to have UTI, left UPJ stone, moderate hydronephrosis. patient underwent ureteral stent placement on 8/7. PMH: COPD and pulmonary nodules/CA   OT comments  Patient was noted to have made good progress towards goals. Patient was educated on proper hand placement for transfers and to keep walker on the floor when participating in functional mobility. Patient verbalized understanding but required continued education during activity. Patient was min A for transfers with improvement to min guard during session. Patient was min A for hygeine tasks with education on toileting wand. Patients daughter was present reporting they had care lined up and inquiring about why shower seat v.s. tub bench was ordered. Case manager was messaged about issue. Case manager to follow up. Patient's discharge plan remains appropriate at this time. OT will continue to follow acutely.    Follow Up Recommendations  Supervision/Assistance - 24 hour;Home health OT    Equipment Recommendations  Tub/shower bench    Recommendations for Other Services      Precautions / Restrictions Precautions Precautions: Fall Precaution Comments: monitor O2- on 2L/min at home Restrictions Weight Bearing Restrictions: No       Mobility Bed Mobility Overal bed mobility: Needs Assistance Bed Mobility: Supine to Sit     Supine to sit: Min guard          Transfers Overall transfer level: Needs assistance Equipment used: Rolling walker (2 wheeled) Transfers: Sit to/from Stand Sit to Stand: Min guard Stand pivot transfers: Min assist       General transfer comment: min A to transfer on and off commode and recliner with RW    Balance Overall balance assessment: Needs  assistance Sitting-balance support: Feet supported Sitting balance-Leahy Scale: Fair     Standing balance support: Bilateral upper extremity supported Standing balance-Leahy Scale: Poor Standing balance comment: patient needed cues to keep walker on the ground                           ADL either performed or assessed with clinical judgement   ADL Overall ADL's : Needs assistance/impaired     Grooming: Wash/dry face;Wash/dry hands;Sitting;Set up   Upper Body Bathing: Min guard;Sitting   Lower Body Bathing: Minimal assistance;Sit to/from stand Lower Body Bathing Details (indicate cue type and reason): patient needed min A to clean bottom with education to patient and daughter provided about toileting wands and different styles available. daughter to look into. Upper Body Dressing : Set up;Sitting   Lower Body Dressing: Sitting/lateral leans;Minimal assistance Lower Body Dressing Details (indicate cue type and reason): patient was educated on crossing legs 4 style to don/doff socks and shoes. patient required min A to don mesh undergarments with increased time. patient declined to learn about reacher or sock aid to reduce bending for ADL tasks. Toilet Transfer: Min Adult nurse and Hygiene: Minimal assistance;Sit to/from stand Toileting - Clothing Manipulation Details (indicate cue type and reason): with increased time and cues for breathing     Functional mobility during ADLs: Min guard;Rolling walker General ADL Comments: patient was noted to be fatigued after ADL tasks on this date.     Vision       Perception     Praxis      Cognition Arousal/Alertness:  Awake/alert Behavior During Therapy: WFL for tasks assessed/performed Overall Cognitive Status: Within Functional Limits for tasks assessed                                          Exercises     Shoulder Instructions       General Comments       Pertinent Vitals/ Pain       Pain Assessment: No/denies pain  Home Living                                          Prior Functioning/Environment              Frequency  Min 2X/week        Progress Toward Goals  OT Goals(current goals can now be found in the care plan section)  Progress towards OT goals: Progressing toward goals  Acute Rehab OT Goals Patient Stated Goal: to get back home OT Goal Formulation: With patient Time For Goal Achievement: 01/15/21 Potential to Achieve Goals: Good  Plan Discharge plan remains appropriate    Co-evaluation                 AM-PAC OT "6 Clicks" Daily Activity     Outcome Measure   Help from another person eating meals?: None Help from another person taking care of personal grooming?: A Little Help from another person toileting, which includes using toliet, bedpan, or urinal?: A Little Help from another person bathing (including washing, rinsing, drying)?: A Little Help from another person to put on and taking off regular upper body clothing?: A Little Help from another person to put on and taking off regular lower body clothing?: A Little 6 Click Score: 19    End of Session Equipment Utilized During Treatment: Gait belt;Rolling walker  OT Visit Diagnosis: Unsteadiness on feet (R26.81);Muscle weakness (generalized) (M62.81)   Activity Tolerance Patient tolerated treatment well   Patient Left in chair;with call bell/phone within reach;with family/visitor present   Nurse Communication Other (comment) (nurse cleared patient to particiapte)        Time: 0822-0908 OT Time Calculation (min): 46 min  Charges: OT General Charges $OT Visit: 1 Visit OT Treatments $Self Care/Home Management : 38-52 mins  Jackelyn Poling OTR/L, [facility] Office# [phone] Pager# [phone]    [place] 01/03/2021, 10:42 AM

## 2021-01-03 NOTE — Discharge Summary (Signed)
Physician Discharge Summary  Lorianne Malbrough SVX:[id number] DOB: [birth year] DOA: 12/28/2020  PCP: [name], MD  Admit date: 12/28/2020 Discharge date: 01/03/2021  Admitted From: home Discharge disposition: home   Recommendations for Outpatient Follow-Up:   24 hour care with family Home health [facility] 1 week   Discharge Diagnosis:   Principal Problem:   E coli bacteremia Active Problems:   Renal calculi   AKI (acute kidney injury) ([place])   Complicated UTI (urinary tract infection)   Bacterial infection due to Staphylococcus    Discharge Condition: Improved.  Diet recommendation: Low sodium, heart healthy  Wound care: None.  Code status: Full.   History of Present Illness:   Brandy Morales is a 75 y.o. 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   Patient had progressive generalized weakness and left back pain over the last 4 days.  She also had associated nausea and emesis.  She had 1 episode of nonbloody and nonbilious emesis 2 days ago.  She has not had further emesis after that.  She has poor p.o. intake.  She has been trying to manage her symptoms with leftover oxycodone and Zofran from her last hospitalization for nephrolithiasis in February.  Patient sounded weak and talking out of her mind when patient's daughter called her, and her daughter decided to bring her to the hospital for evaluation.  Patient denies fever, URI symptoms, changes in her breathing, chest pain, focal neurodeficit, diarrhea, dysuria, frequency, urgency or hematuria.  She admits to pain across lower abdomen.  She says she might be constipated.  Last bowel movement was 2 days ago.    Former smoker.  Denies drinking alcohol recreational drug use.  Prefers to remain full code.   Hospital Course by Problem:   AKI/azotemia: B/l Cr 0.82.    -much improved   Polymicrobial bacteremia-blood culture with E. coli in 1 bottle and Staphylococcus lugdunensis (negative for MecA/C) and strep mitis/oralis in other.  GPC's could be contaminant.  TTE with normal LVEF and G1DD but technically difficult study to exclude vegetation.  Patient is clinically improving. -ID following             -plan: cefdinir 300 mg PO every 12 hours to finish her course of therapy.  Recommend 14 days from date of stent placement.  End date equals 01/13/2021.             She needs to follow-up with urology as an outpatient for definitive stone management.  Ideally this could be done in this 2-week interval while she is on antibiotics.  However, if procedure will be done after treatment of this infection she can just receive appropriate perioperative antibiotics with something like ceftriaxone   Complicated UTI/Left UPJ stone with moderate hydronephrosis-normally follows with Dr. Gloriann Loan -S/p left ureteral stent by Dr. Alyson Ingles on 8/7 -see above   Chronic COPD/chronic hypoxic respiratory failure:  -continue home O2- 2L   Elevated BNP/troponin-patient has no cardiopulmonary symptoms.  Appears dry on exam.  No history of CHF or CAD.  Likely delayed clearance in the setting of AKI.  TTE with normal LVEF and G1 DD.   Lactic acidosis: 1.6>> 2.3>> 0.6.  Resolved.   Nausea and vomiting: Resolved.   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 -Continue home Celexa   Generalized weakness: Very independent at baseline.  Able to drive herself.  She is very weak at this time. -home health   Constipation: Seems to have resolved. -MiraLAX and Senokot-S   Class I obesity Body mass index is 32.22 kg/m.    Medical Consultants:   urology   Discharge Exam:   Vitals:   01/03/21 0438 01/03/21 0754  BP: (!) 149/88   Pulse: 86   Resp: 20    Temp: 99 F (37.2 C)   SpO2:  97%   Vitals:   01/02/21 2046 01/03/21 0101 01/03/21 0438 01/03/21 0754  BP: (!) 145/87  (!) 149/88   Pulse: 98  86   Resp: 20  20   Temp:   99 F (37.2 C)   TempSrc:   Oral   SpO2: 91% 98%  97%  Weight:      Height:        General exam: Appears calm and comfortable.   The results of significant diagnostics from this hospitalization (including imaging, microbiology, ancillary and laboratory) are listed below for reference.     Procedures and Diagnostic Studies:   CT ABDOMEN PELVIS WO CONTRAST  Result Date: 12/28/2020 CLINICAL DATA:  Flank pain EXAM: CT ABDOMEN AND PELVIS WITHOUT CONTRAST TECHNIQUE: Multidetector CT imaging of the abdomen and pelvis was performed following the standard protocol without IV contrast. COMPARISON:  CT chest, 11/30/2020.  CT Abdomen Pelvis, 01/26/2018 FINDINGS: Lower chest: Centrilobular emphysematous change of the lung bases, greater on right. Coronary calcifications Hepatobiliary: Multiple circumscribed hypodense lesions within liver, similar to comparison, and likely consistent with hepatic cysts. Interval distention of gallbladder, with radiodense gallstone. No evidence of gallbladder wall thickening. No biliary ductal dilation. Pancreas: Unremarkable. No pancreatic ductal dilatation or surrounding inflammatory changes. Spleen: Normal in size without focal abnormality. Adrenals/Urinary Tract: *2.5 cm left adrenal thickening, unchanged. *Right renal calcification is unchanged.  No right hydronephrosis. *Left perirenal stranding. 6 mm left ureteropelvic junction nephrolith, with resulting moderate hydronephrosis Stomach/Bowel: Stomach is within normal limits. Appendix appears normal. Severe sigmoid diverticulosis, without evidence of diverticulitis. Mild rectal distention of stool. No evidence of bowel wall thickening, distention, or inflammatory changes. Vascular/Lymphatic: Aortic and iliac vascular calcifications, without  aneurysmal dilation. No enlarged abdominal or pelvic lymph nodes. Reproductive: Status post hysterectomy. No adnexal masses. Other: No abdominal wall hernia or abnormality. No abdominopelvic ascites. Musculoskeletal: Multilevel degenerative change of imaged spine. No acute osseous findings. IMPRESSION: 1. 6 mm left UPJ calculus, mild with resulting moderate hydronephrosis. 2. Cholelithiasis with interval distention of gallbladder. No gallbladder wall thickening or additional findings to suggest acute cholelithiasis. 3. Additional chronic and senescent changes, as above. 4. Aortic Atherosclerosis (ICD10-I70.0) and Emphysema (ICD10-J43.9). Electronically Signed   By: Michaelle Birks MD   On: 12/28/2020 16:03     Labs:   Basic Metabolic Panel: Recent Labs  Lab 12/29/20 7209 12/30/20 0450 12/31/20 0415 01/01/21 0425 01/02/21 0503  NA 137 139 143 144 147*  K 3.5 3.5 3.9 3.4* 4.3  CL 103 109 110 109 111  CO2 22 26 25 24 28   GLUCOSE 154* 119* 140* 96 103*  BUN 81* 69* 54* 46* 36*  CREATININE 3.03* 2.03* 1.53* 1.17* 1.07*  CALCIUM 7.8* 8.2* 9.0 9.0 9.3  MG 2.0 2.0 1.9 1.8 1.7  PHOS 4.1 3.5 3.0 2.8 3.1   GFR Estimated Creatinine Clearance: 45.1 mL/min (A) (by C-G formula based on SCr of 1.07 mg/dL (H)). Liver Function Tests: Recent Labs  Lab  12/28/20 1222 12/29/20 0657 12/30/20 0450 12/31/20 0415 01/01/21 0425 01/02/21 0503  AST 29  --   --   --   --   --   ALT 32  --   --   --   --   --   ALKPHOS 80  --   --   --   --   --   BILITOT 1.1  --   --   --   --   --   PROT 6.4*  --   --   --   --   --   ALBUMIN 3.1* 2.3* 2.3* 2.4* 2.3* 2.6*   No results for input(s): LIPASE, AMYLASE in the last 168 hours. No results for input(s): AMMONIA in the last 168 hours. Coagulation profile No results for input(s): INR, PROTIME in the last 168 hours.  CBC: Recent Labs  Lab 12/28/20 1222 12/28/20 1313 12/29/20 0657 12/30/20 0450 12/31/20 0415 01/01/21 0425 01/02/21 0503  WBC 16.4*  --   8.3 6.5 5.6 7.8 8.5  NEUTROABS 14.4*  --  7.0  --   --   --   --   HGB 13.6   < > 10.9* 11.3* 12.1 11.7* 12.3  HCT 42.2   < > 33.2* 35.4* 38.1 37.3 39.5  MCV 88.8  --  89.2 89.8 90.5 91.2 91.4  PLT 115*  --  89* 91* 111* 121* 145*   < > = values in this interval not displayed.   Cardiac Enzymes: Recent Labs  Lab 12/29/20 0657  CKTOTAL 94   BNP: Invalid input(s): POCBNP CBG: No results for input(s): GLUCAP in the last 168 hours. D-Dimer No results for input(s): DDIMER in the last 72 hours. Hgb A1c No results for input(s): HGBA1C in the last 72 hours. Lipid Profile No results for input(s): CHOL, HDL, LDLCALC, TRIG, CHOLHDL, LDLDIRECT in the last 72 hours. Thyroid function studies No results for input(s): TSH, T4TOTAL, T3FREE, THYROIDAB in the last 72 hours.  Invalid input(s): FREET3 Anemia work up Recent Labs    01/02/21 0503  VITAMINB12 1,876*  FOLATE 20.0  FERRITIN 232  TIBC 228*  IRON 37  RETICCTPCT 0.8   Microbiology Recent Results (from the past 240 hour(s))  Blood culture (routine x 2)     Status: Abnormal   Collection Time: 12/28/20  3:37 PM   Specimen: BLOOD LEFT ARM  Result Value Ref Range Status   Specimen Description BLOOD LEFT ARM  Final   Special Requests   Final    BOTTLES DRAWN AEROBIC AND ANAEROBIC Blood Culture adequate volume   Culture  Setup Time   Final    GRAM NEGATIVE RODS IN BOTH AEROBIC AND ANAEROBIC BOTTLES CRITICAL RESULT CALLED TO, READ BACK BY AND VERIFIED WITH: AHN PHAM PHARMD @1641  12/29/20 EB Performed at [facility], [place] [address], [place], [place] [address]    Culture ESCHERICHIA COLI (A)  Final   Report Status 12/31/2020 FINAL  Final   Organism ID, Bacteria ESCHERICHIA COLI  Final      Susceptibility   Escherichia coli - MIC*    AMPICILLIN >=32 RESISTANT Resistant     CEFAZOLIN 32 INTERMEDIATE Intermediate     CEFEPIME <=0.12 SENSITIVE Sensitive     CEFTAZIDIME 2 SENSITIVE Sensitive     CEFTRIAXONE 1 SENSITIVE  Sensitive     CIPROFLOXACIN <=0.25 SENSITIVE Sensitive     GENTAMICIN <=1 SENSITIVE Sensitive     IMIPENEM <=0.25 SENSITIVE Sensitive     TRIMETH/SULFA <=20  SENSITIVE Sensitive     AMPICILLIN/SULBACTAM >=32 RESISTANT Resistant     PIP/TAZO 8 SENSITIVE Sensitive     * ESCHERICHIA COLI  Blood Culture ID Panel (Reflexed)     Status: Abnormal   Collection Time: 12/28/20  3:37 PM  Result Value Ref Range Status   Enterococcus faecalis NOT DETECTED NOT DETECTED Final   Enterococcus Faecium NOT DETECTED NOT DETECTED Final   Listeria monocytogenes NOT DETECTED NOT DETECTED Final   Staphylococcus species NOT DETECTED NOT DETECTED Final   Staphylococcus aureus (BCID) NOT DETECTED NOT DETECTED Final   Staphylococcus epidermidis NOT DETECTED NOT DETECTED Final   Staphylococcus lugdunensis NOT DETECTED NOT DETECTED Final   Streptococcus species NOT DETECTED NOT DETECTED Final   Streptococcus agalactiae NOT DETECTED NOT DETECTED Final   Streptococcus pneumoniae NOT DETECTED NOT DETECTED Final   Streptococcus pyogenes NOT DETECTED NOT DETECTED Final   A.calcoaceticus-baumannii NOT DETECTED NOT DETECTED Final   Bacteroides fragilis NOT DETECTED NOT DETECTED Final   Enterobacterales DETECTED (A) NOT DETECTED Final    Comment: Enterobacterales represent a large order of gram negative bacteria, not a single organism. CRITICAL RESULT CALLED TO, READ BACK BY AND VERIFIED WITH: AHN PHAM PHARMD @1641  12/29/20 EB    Enterobacter cloacae complex NOT DETECTED NOT DETECTED Final   Escherichia coli DETECTED (A) NOT DETECTED Final    Comment: CRITICAL RESULT CALLED TO, READ BACK BY AND VERIFIED WITH: AHN PHAM PHARMD @1641  12/29/20 EB    Klebsiella aerogenes NOT DETECTED NOT DETECTED Final   Klebsiella oxytoca NOT DETECTED NOT DETECTED Final   Klebsiella pneumoniae NOT DETECTED NOT DETECTED Final   Proteus species NOT DETECTED NOT DETECTED Final   Salmonella species NOT DETECTED NOT DETECTED Final   Serratia  marcescens NOT DETECTED NOT DETECTED Final   Haemophilus influenzae NOT DETECTED NOT DETECTED Final   Neisseria meningitidis NOT DETECTED NOT DETECTED Final   Pseudomonas aeruginosa NOT DETECTED NOT DETECTED Final   Stenotrophomonas maltophilia NOT DETECTED NOT DETECTED Final   Candida albicans NOT DETECTED NOT DETECTED Final   Candida auris NOT DETECTED NOT DETECTED Final   Candida glabrata NOT DETECTED NOT DETECTED Final   Candida krusei NOT DETECTED NOT DETECTED Final   Candida parapsilosis NOT DETECTED NOT DETECTED Final   Candida tropicalis NOT DETECTED NOT DETECTED Final   Cryptococcus neoformans/gattii NOT DETECTED NOT DETECTED Final   CTX-M ESBL NOT DETECTED NOT DETECTED Final   Carbapenem resistance IMP NOT DETECTED NOT DETECTED Final   Carbapenem resistance KPC NOT DETECTED NOT DETECTED Final   Carbapenem resistance NDM NOT DETECTED NOT DETECTED Final   Carbapenem resist OXA 48 LIKE NOT DETECTED NOT DETECTED Final   Carbapenem resistance VIM NOT DETECTED NOT DETECTED Final    Comment: Performed at [facility], [address], [place], [place] [address]  Blood culture (routine x 2)     Status: Abnormal   Collection Time: 12/28/20  3:42 PM   Specimen: BLOOD  Result Value Ref Range Status   Specimen Description BLOOD LEFT ANTECUBITAL  Final   Special Requests   Final    BOTTLES DRAWN AEROBIC AND ANAEROBIC Blood Culture adequate volume   Culture  Setup Time   Final    GRAM POSITIVE COCCI ANAEROBIC BOTTLE ONLY CRITICAL RESULT CALLED TO, READ BACK BY AND VERIFIED WITH: L POINDEXTER,PHARMD@0031  12/30/20 [place]    Culture (A)  Final    STAPHYLOCOCCUS LUGDUNENSIS STREPTOCOCCUS MITIS/ORALIS STAPHYLOCOCCUS CAPITIS THE SIGNIFICANCE OF ISOLATING THIS ORGANISM FROM A SINGLE SET OF BLOOD  CULTURES WHEN MULTIPLE SETS ARE DRAWN IS UNCERTAIN. PLEASE NOTIFY THE MICROBIOLOGY DEPARTMENT WITHIN ONE WEEK IF SPECIATION AND SENSITIVITIES ARE REQUIRED. Performed at [facility], [place] [address] [place], [place] [address]    Report Status 01/01/2021 FINAL  Final  Blood Culture ID Panel (Reflexed)     Status: Abnormal   Collection Time: 12/28/20  3:42 PM  Result Value Ref Range Status   Enterococcus faecalis NOT DETECTED NOT DETECTED Final   Enterococcus Faecium NOT DETECTED NOT DETECTED Final   Listeria monocytogenes NOT DETECTED NOT DETECTED Final   Staphylococcus species DETECTED (A) NOT DETECTED Final    Comment: CRITICAL RESULT CALLED TO, READ BACK BY AND VERIFIED WITH: L POINDEXTER,PHARM @0031  12/30/20 [place]    Staphylococcus aureus (BCID) NOT DETECTED NOT DETECTED Final   Staphylococcus epidermidis NOT DETECTED NOT DETECTED Final   Staphylococcus lugdunensis DETECTED (A) NOT DETECTED Final    Comment: CRITICAL RESULT CALLED TO, READ BACK BY AND VERIFIED WITH: L POINDEXTER,PHARMD@0033  12/30/20 [place]    Streptococcus species DETECTED (A) NOT DETECTED Final    Comment: Not Enterococcus species, Streptococcus agalactiae, Streptococcus pyogenes, or Streptococcus pneumoniae. CRITICAL RESULT CALLED TO, READ BACK BY AND VERIFIED WITH: L POINDEXTER,PHARMD@0031  12/30/20 [place]    Streptococcus agalactiae NOT DETECTED NOT DETECTED Final   Streptococcus pneumoniae NOT DETECTED NOT DETECTED Final   Streptococcus pyogenes NOT DETECTED NOT DETECTED Final   A.calcoaceticus-baumannii NOT DETECTED NOT DETECTED Final   Bacteroides fragilis NOT DETECTED NOT DETECTED Final   Enterobacterales NOT DETECTED NOT DETECTED Final   Enterobacter cloacae complex NOT DETECTED NOT DETECTED Final   Escherichia coli NOT DETECTED NOT DETECTED Final   Klebsiella aerogenes NOT DETECTED NOT DETECTED Final   Klebsiella oxytoca NOT DETECTED NOT DETECTED Final   Klebsiella pneumoniae NOT DETECTED NOT DETECTED Final   Proteus species NOT DETECTED NOT DETECTED Final   Salmonella species NOT DETECTED NOT DETECTED Final   Serratia marcescens NOT DETECTED NOT DETECTED Final   Haemophilus influenzae NOT DETECTED  NOT DETECTED Final   Neisseria meningitidis NOT DETECTED NOT DETECTED Final   Pseudomonas aeruginosa NOT DETECTED NOT DETECTED Final   Stenotrophomonas maltophilia NOT DETECTED NOT DETECTED Final   Candida albicans NOT DETECTED NOT DETECTED Final   Candida auris NOT DETECTED NOT DETECTED Final   Candida glabrata NOT DETECTED NOT DETECTED Final   Candida krusei NOT DETECTED NOT DETECTED Final   Candida parapsilosis NOT DETECTED NOT DETECTED Final   Candida tropicalis NOT DETECTED NOT DETECTED Final   Cryptococcus neoformans/gattii NOT DETECTED NOT DETECTED Final   Methicillin resistance mecA/C NOT DETECTED NOT DETECTED Final    Comment: Performed at [facility], [address], [place], [place] [address]  Resp Panel by RT-PCR (Flu A&B, Covid) Nasopharyngeal Swab     Status: None   Collection Time: 12/28/20  7:19 PM   Specimen: Nasopharyngeal Swab; Nasopharyngeal(NP) swabs in vial transport medium  Result Value Ref Range Status   SARS Coronavirus 2 by RT PCR NEGATIVE NEGATIVE Final    Comment: (NOTE) SARS-CoV-2 target nucleic acids are NOT DETECTED.  The SARS-CoV-2 RNA is generally detectable in upper respiratory specimens during the acute phase of infection. The lowest concentration of SARS-CoV-2 viral copies this assay can detect is 138 copies/mL. A negative result does not preclude SARS-Cov-2 infection and should not be used as the sole basis for treatment or other patient management decisions. A negative result may occur with  improper specimen collection/handling, submission of specimen other than nasopharyngeal swab,  presence of viral mutation(s) within the areas targeted by this assay, and inadequate number of viral copies(<138 copies/mL). A negative result must be combined with clinical observations, patient history, and epidemiological information. The expected result is Negative.  Fact Sheet for Patients:  EntrepreneurPulse.com.au  Fact Sheet for  Healthcare Providers:  IncredibleEmployment.be  This test is no t yet approved or cleared by the [place] FDA and  has been authorized for detection and/or diagnosis of SARS-CoV-2 by FDA under an Emergency Use Authorization (EUA). This EUA will remain  in effect (meaning this test can be used) for the duration of the COVID-19 declaration under Section 564(b)(1) of the Act, 21 U.S.C.section 360bbb-3(b)(1), unless the authorization is terminated  or revoked sooner.       Influenza A by PCR NEGATIVE NEGATIVE Final   Influenza B by PCR NEGATIVE NEGATIVE Final    Comment: (NOTE) The Xpert Xpress SARS-CoV-2/FLU/RSV plus assay is intended as an aid in the diagnosis of influenza from Nasopharyngeal swab specimens and should not be used as a sole basis for treatment. Nasal washings and aspirates are unacceptable for Xpert Xpress SARS-CoV-2/FLU/RSV testing.  Fact Sheet for Patients: EntrepreneurPulse.com.au  Fact Sheet for Healthcare Providers: IncredibleEmployment.be  This test is not yet approved or cleared by the [place] FDA and has been authorized for detection and/or diagnosis of SARS-CoV-2 by FDA under an Emergency Use Authorization (EUA). This EUA will remain in effect (meaning this test can be used) for the duration of the COVID-19 declaration under Section 564(b)(1) of the Act, 21 U.S.C. section 360bbb-3(b)(1), unless the authorization is terminated or revoked.  Performed at [facility], [place] [address] [place], [place] [address]   Culture, blood (routine x 2)     Status: None (Preliminary result)   Collection Time: 12/30/20  9:25 AM   Specimen: BLOOD  Result Value Ref Range Status   Specimen Description   Final    BLOOD BLOOD RIGHT FOREARM Performed at [place] [address] [place], [place] [address]    Special Requests   Final    BOTTLES DRAWN AEROBIC AND  ANAEROBIC Blood Culture results may not be optimal due to an inadequate volume of blood received in culture bottles Performed at [name] [address] [place], [place] [address]    Culture   Final    NO GROWTH 4 DAYS Performed at [facility], [place] [address], [place], [place] [address]    Report Status PENDING  Incomplete  Culture, blood (routine x 2)     Status: None (Preliminary result)   Collection Time: 12/30/20  9:25 AM   Specimen: BLOOD  Result Value Ref Range Status   Specimen Description   Final    BLOOD BLOOD LEFT WRIST Performed at [facility] [address], [place], [place] [address]    Special Requests   Final    BOTTLES DRAWN AEROBIC AND ANAEROBIC Blood Culture adequate volume Performed at [facility] [address] [place], [place] [address]    Culture   Final    NO GROWTH 4 DAYS Performed at [facility], [place] [address], [place], [place] [address]    Report Status PENDING  Incomplete     Discharge Instructions:   Discharge Instructions     Call MD for:  temperature >100.4   Complete by: As directed    Diet - low sodium heart healthy   Complete by: As directed  Discharge instructions   Complete by: As directed    Can resume home multivitamin   Increase activity slowly   Complete by: As directed       Allergies as of 01/03/2021       Reactions   Statins Cough   Latex Other (See Comments)   Broke out on face        Medication List     STOP taking these medications    MULTIVITAMIN PO   naproxen sodium 220 MG tablet Commonly known as: ALEVE   Pfizer-BioNT COVID-19 Vac-TriS Susp injection Generic drug: COVID-19 mRNA Vac-TriS [employer])       TAKE these medications    acyclovir 400 MG tablet Commonly known as: ZOVIRAX Take 1 tablet (400 mg total) by mouth 3 (three) times daily. What changed:  when to take this reasons to take this   albuterol 108 (90  Base) MCG/ACT inhaler Commonly known as: VENTOLIN HFA Inhale 2 puffs into the lungs every 6 (six) hours as needed for wheezing or shortness of breath.   atorvastatin 10 MG tablet Commonly known as: LIPITOR Take 5 mg by mouth 3 (three) times a week. Take on MWF   Breo Ellipta 200-25 MCG/INH Aepb Generic drug: fluticasone furoate-vilanterol Inhale 1 puff into the lungs every evening.   cefdinir 300 MG capsule Commonly known as: OMNICEF Take 1 capsule (300 mg total) by mouth every 12 (twelve) hours for 10 days.   cetirizine 10 MG tablet Commonly known as: ZYRTEC Take 10 mg by mouth daily.   citalopram 20 MG tablet Commonly known as: CELEXA Take 20 mg by mouth daily.   ezetimibe 10 MG tablet Commonly known as: ZETIA Take 10 mg by mouth at bedtime.   fluticasone 50 MCG/ACT nasal spray Commonly known as: FLONASE Place 1 spray into both nostrils daily.   GENTEAL TEARS OP Place 1 drop into both eyes daily.   lidocaine 5 % Commonly known as: LIDODERM Place 1 patch onto the skin daily. Remove & Discard patch within 12 hours or as directed by MD   losartan-hydrochlorothiazide 100-25 MG tablet Commonly known as: HYZAAR Take 0.5 tablets by mouth daily. Start taking on: January 04, 2021   omeprazole 20 MG capsule Commonly known as: PRILOSEC Take 20 mg by mouth daily.   OXYGEN Inhale 2 L into the lungs continuous.   tamsulosin 0.4 MG Caps capsule Commonly known as: FLOMAX Take 0.4 mg by mouth daily.   tiotropium 18 MCG inhalation capsule Commonly known as: SPIRIVA Place 1 capsule (18 mcg total) into inhaler and inhale daily.   Vitamin D3 125 MCG (5000 UT) Caps Take 5,000 Units by mouth daily.               Durable Medical Equipment  (From admission, onward)           Start     Ordered   01/03/21 0917  For home use only DME Tub bench  Once       Comments: Transfer tub bench   01/03/21 0916   01/02/21 1411  For home use only DME 4 wheeled rolling walker  with seat  Once       Question:  Patient needs a walker to treat with the following condition  Answer:  Unsteady gait   01/02/21 1411   01/02/21 1410  For home use only DME 3 n 1  Once        01/02/21 1409  Follow-up Information     [name], MD Follow up in 1 week(s).   Specialty: Internal Medicine Contact information: [address] [place] [address] [phone]         Lucas Mallow, MD Follow up.   Specialty: Urology Contact information: [place] [address] [phone]                  Time coordinating discharge: 35 min  Signed:  Geradine Girt DO  [facility] 01/03/2021, 11:21 AM

## 2021-01-04 LAB — CULTURE, BLOOD (ROUTINE X 2)
Culture: NO GROWTH
Culture: NO GROWTH
Special Requests: ADEQUATE

## 2021-01-08 DIAGNOSIS — F419 Anxiety disorder, unspecified: Secondary | ICD-10-CM | POA: Diagnosis not present

## 2021-01-08 DIAGNOSIS — I2510 Atherosclerotic heart disease of native coronary artery without angina pectoris: Secondary | ICD-10-CM | POA: Diagnosis not present

## 2021-01-08 DIAGNOSIS — K219 Gastro-esophageal reflux disease without esophagitis: Secondary | ICD-10-CM | POA: Diagnosis not present

## 2021-01-08 DIAGNOSIS — Z87891 Personal history of nicotine dependence: Secondary | ICD-10-CM | POA: Diagnosis not present

## 2021-01-08 DIAGNOSIS — N136 Pyonephrosis: Secondary | ICD-10-CM | POA: Diagnosis not present

## 2021-01-08 DIAGNOSIS — I119 Hypertensive heart disease without heart failure: Secondary | ICD-10-CM | POA: Diagnosis not present

## 2021-01-08 DIAGNOSIS — Z6829 Body mass index (BMI) 29.0-29.9, adult: Secondary | ICD-10-CM | POA: Diagnosis not present

## 2021-01-08 DIAGNOSIS — K5730 Diverticulosis of large intestine without perforation or abscess without bleeding: Secondary | ICD-10-CM | POA: Diagnosis not present

## 2021-01-08 DIAGNOSIS — J432 Centrilobular emphysema: Secondary | ICD-10-CM | POA: Diagnosis not present

## 2021-01-08 DIAGNOSIS — N179 Acute kidney failure, unspecified: Secondary | ICD-10-CM | POA: Diagnosis not present

## 2021-01-08 DIAGNOSIS — E669 Obesity, unspecified: Secondary | ICD-10-CM | POA: Diagnosis not present

## 2021-01-08 DIAGNOSIS — Z9981 Dependence on supplemental oxygen: Secondary | ICD-10-CM | POA: Diagnosis not present

## 2021-01-08 DIAGNOSIS — I700 Atherosclerosis of aorta: Secondary | ICD-10-CM | POA: Diagnosis not present

## 2021-01-08 DIAGNOSIS — Z7951 Long term (current) use of inhaled steroids: Secondary | ICD-10-CM | POA: Diagnosis not present

## 2021-01-08 DIAGNOSIS — C3491 Malignant neoplasm of unspecified part of right bronchus or lung: Secondary | ICD-10-CM | POA: Diagnosis not present

## 2021-01-08 DIAGNOSIS — J9611 Chronic respiratory failure with hypoxia: Secondary | ICD-10-CM | POA: Diagnosis not present

## 2021-01-08 DIAGNOSIS — K8020 Calculus of gallbladder without cholecystitis without obstruction: Secondary | ICD-10-CM | POA: Diagnosis not present

## 2021-01-08 DIAGNOSIS — Z9181 History of falling: Secondary | ICD-10-CM | POA: Diagnosis not present

## 2021-01-15 DIAGNOSIS — B373 Candidiasis of vulva and vagina: Secondary | ICD-10-CM | POA: Diagnosis not present

## 2021-01-15 DIAGNOSIS — R42 Dizziness and giddiness: Secondary | ICD-10-CM | POA: Diagnosis not present

## 2021-01-15 DIAGNOSIS — N390 Urinary tract infection, site not specified: Secondary | ICD-10-CM | POA: Diagnosis not present

## 2021-01-15 DIAGNOSIS — N132 Hydronephrosis with renal and ureteral calculous obstruction: Secondary | ICD-10-CM | POA: Diagnosis not present

## 2021-01-17 DIAGNOSIS — N201 Calculus of ureter: Secondary | ICD-10-CM | POA: Diagnosis not present

## 2021-01-22 DIAGNOSIS — E7800 Pure hypercholesterolemia, unspecified: Secondary | ICD-10-CM | POA: Diagnosis not present

## 2021-01-22 DIAGNOSIS — E782 Mixed hyperlipidemia: Secondary | ICD-10-CM | POA: Diagnosis not present

## 2021-01-22 DIAGNOSIS — J441 Chronic obstructive pulmonary disease with (acute) exacerbation: Secondary | ICD-10-CM | POA: Diagnosis not present

## 2021-01-22 DIAGNOSIS — I10 Essential (primary) hypertension: Secondary | ICD-10-CM | POA: Diagnosis not present

## 2021-01-22 DIAGNOSIS — N132 Hydronephrosis with renal and ureteral calculous obstruction: Secondary | ICD-10-CM | POA: Diagnosis not present

## 2021-01-22 DIAGNOSIS — J449 Chronic obstructive pulmonary disease, unspecified: Secondary | ICD-10-CM | POA: Diagnosis not present

## 2021-01-22 DIAGNOSIS — I2510 Atherosclerotic heart disease of native coronary artery without angina pectoris: Secondary | ICD-10-CM | POA: Diagnosis not present

## 2021-01-22 DIAGNOSIS — D4100 Neoplasm of uncertain behavior of unspecified kidney: Secondary | ICD-10-CM | POA: Diagnosis not present

## 2021-01-23 ENCOUNTER — Other Ambulatory Visit: Payer: Self-pay | Admitting: Urology

## 2021-01-23 DIAGNOSIS — N201 Calculus of ureter: Secondary | ICD-10-CM

## 2021-01-30 DIAGNOSIS — J449 Chronic obstructive pulmonary disease, unspecified: Secondary | ICD-10-CM | POA: Diagnosis not present

## 2021-02-04 ENCOUNTER — Encounter (HOSPITAL_BASED_OUTPATIENT_CLINIC_OR_DEPARTMENT_OTHER): Payer: Self-pay | Admitting: Urology

## 2021-02-05 NOTE — Progress Notes (Signed)
Patient to arrive at [facility] on 01/07/2021. History and medications reviewed. Pre-procedure instructions given. NPO after MN on Wednesday except for clear liquids until 0715. Patient will take am dose of spiriva and losartan morning of procedure. Patient is O2 dependant. Driver secured.Marland Kitchen

## 2021-02-07 ENCOUNTER — Ambulatory Visit (HOSPITAL_COMMUNITY): Payer: PPO

## 2021-02-07 ENCOUNTER — Ambulatory Visit (HOSPITAL_BASED_OUTPATIENT_CLINIC_OR_DEPARTMENT_OTHER): Payer: PPO | Admitting: Certified Registered"

## 2021-02-07 ENCOUNTER — Ambulatory Visit (HOSPITAL_BASED_OUTPATIENT_CLINIC_OR_DEPARTMENT_OTHER)
Admission: RE | Admit: 2021-02-07 | Discharge: 2021-02-07 | Disposition: A | Discharge status: Home / Self Care | Payer: PPO | Attending: Urology | Admitting: Urology

## 2021-02-07 ENCOUNTER — Encounter (HOSPITAL_BASED_OUTPATIENT_CLINIC_OR_DEPARTMENT_OTHER)
Admission: RE | Disposition: A | Discharge status: Home / Self Care | Payer: Self-pay | Source: Home / Self Care | Attending: Urology

## 2021-02-07 ENCOUNTER — Other Ambulatory Visit: Payer: Self-pay

## 2021-02-07 ENCOUNTER — Encounter (HOSPITAL_BASED_OUTPATIENT_CLINIC_OR_DEPARTMENT_OTHER): Payer: Self-pay | Admitting: Urology

## 2021-02-07 DIAGNOSIS — N201 Calculus of ureter: Secondary | ICD-10-CM | POA: Insufficient documentation

## 2021-02-07 DIAGNOSIS — I10 Essential (primary) hypertension: Secondary | ICD-10-CM | POA: Insufficient documentation

## 2021-02-07 DIAGNOSIS — K219 Gastro-esophageal reflux disease without esophagitis: Secondary | ICD-10-CM | POA: Diagnosis not present

## 2021-02-07 DIAGNOSIS — N200 Calculus of kidney: Secondary | ICD-10-CM | POA: Diagnosis not present

## 2021-02-07 DIAGNOSIS — J449 Chronic obstructive pulmonary disease, unspecified: Secondary | ICD-10-CM | POA: Diagnosis not present

## 2021-02-07 DIAGNOSIS — N202 Calculus of kidney with calculus of ureter: Secondary | ICD-10-CM | POA: Diagnosis not present

## 2021-02-07 DIAGNOSIS — N179 Acute kidney failure, unspecified: Secondary | ICD-10-CM | POA: Diagnosis not present

## 2021-02-07 DIAGNOSIS — M47816 Spondylosis without myelopathy or radiculopathy, lumbar region: Secondary | ICD-10-CM | POA: Diagnosis not present

## 2021-02-07 DIAGNOSIS — Z466 Encounter for fitting and adjustment of urinary device: Secondary | ICD-10-CM | POA: Diagnosis not present

## 2021-02-07 HISTORY — PX: EXTRACORPOREAL SHOCK WAVE LITHOTRIPSY: SHX1557

## 2021-02-07 HISTORY — DX: Malignant (primary) neoplasm, unspecified: C80.1

## 2021-02-07 SURGERY — LITHOTRIPSY, ESWL
Anesthesia: Monitor Anesthesia Care | Laterality: Left

## 2021-02-07 MED ORDER — LIDOCAINE HCL (PF) 2 % IJ SOLN
INTRAMUSCULAR | Status: AC
  Filled 2021-02-07: qty 5

## 2021-02-07 MED ORDER — PROPOFOL 500 MG/50ML IV EMUL
INTRAVENOUS | Status: AC
  Filled 2021-02-07: qty 50

## 2021-02-07 MED ORDER — DEXMEDETOMIDINE (PRECEDEX) IN NS 20 MCG/5ML (4 MCG/ML) IV SYRINGE
PREFILLED_SYRINGE | INTRAVENOUS | Status: AC
  Filled 2021-02-07: qty 5

## 2021-02-07 MED ORDER — ONDANSETRON HCL 4 MG/2ML IJ SOLN
INTRAMUSCULAR | Status: AC
  Filled 2021-02-07: qty 2

## 2021-02-07 MED ORDER — DIAZEPAM 5 MG PO TABS
ORAL_TABLET | ORAL | Status: AC
  Filled 2021-02-07: qty 2

## 2021-02-07 MED ORDER — MIDAZOLAM HCL 2 MG/2ML IJ SOLN
INTRAMUSCULAR | Status: AC
  Filled 2021-02-07: qty 2

## 2021-02-07 MED ORDER — DIPHENHYDRAMINE HCL 25 MG PO CAPS
25.0000 mg | ORAL_CAPSULE | ORAL | Status: AC
  Administered 2021-02-07: 25 mg via ORAL

## 2021-02-07 MED ORDER — DIPHENHYDRAMINE HCL 25 MG PO CAPS
ORAL_CAPSULE | ORAL | Status: AC
  Filled 2021-02-07: qty 1

## 2021-02-07 MED ORDER — KETAMINE HCL 50 MG/5ML IJ SOSY
PREFILLED_SYRINGE | INTRAMUSCULAR | Status: AC
  Filled 2021-02-07: qty 5

## 2021-02-07 MED ORDER — HYDROCODONE-ACETAMINOPHEN 5-325 MG PO TABS: 1.0000 | ORAL_TABLET | ORAL | 0 refills | Status: DC | PRN

## 2021-02-07 MED ORDER — TAMSULOSIN HCL 0.4 MG PO CAPS: 0.4000 mg | ORAL_CAPSULE | Freq: Every day | ORAL | 1 refills | Status: DC

## 2021-02-07 MED ORDER — CIPROFLOXACIN HCL 500 MG PO TABS
ORAL_TABLET | ORAL | Status: AC
  Filled 2021-02-07: qty 1

## 2021-02-07 MED ORDER — FENTANYL CITRATE (PF) 100 MCG/2ML IJ SOLN
INTRAMUSCULAR | Status: AC
  Filled 2021-02-07: qty 2

## 2021-02-07 MED ORDER — CIPROFLOXACIN HCL 500 MG PO TABS
500.0000 mg | ORAL_TABLET | ORAL | Status: AC
  Administered 2021-02-07: 500 mg via ORAL

## 2021-02-07 MED ORDER — DIAZEPAM 5 MG PO TABS
10.0000 mg | ORAL_TABLET | ORAL | Status: AC
  Administered 2021-02-07: 10 mg via ORAL

## 2021-02-07 MED ORDER — SODIUM CHLORIDE 0.9 % IV SOLN: INTRAVENOUS | Status: DC

## 2021-02-07 NOTE — Transfer of Care (Signed)
Immediate Anesthesia Transfer of Care Note  Patient: Brandy Morales  Procedure(s) Performed: LEFT EXTRACORPOREAL SHOCK WAVE LITHOTRIPSY (ESWL) (Left)  Patient Location: PACU  Anesthesia Type:MAC  Level of Consciousness: awake, alert  and oriented  Airway & Oxygen Therapy: Patient Spontanous Breathing and Patient connected to nasal cannula oxygen  Post-op Assessment: Report given to RN, Post -op Vital signs reviewed and stable and Patient moving all extremities X 4  Post vital signs: Reviewed  Last Vitals:  Vitals Value Taken Time  BP    Temp    Pulse    Resp    SpO2      Last Pain:  Vitals:   02/07/21 1003  TempSrc: Oral  PainSc: 0-No pain         Complications: No notable events documented.

## 2021-02-07 NOTE — Anesthesia Preprocedure Evaluation (Signed)
Anesthesia Evaluation  Patient identified by MRN, date of birth, ID band Patient awake    Reviewed: Allergy & Precautions, NPO status , Patient's Chart, lab work & pertinent test results  Airway Mallampati: I  TM Distance: >3 FB Neck ROM: Full    Dental  (+) Teeth Intact, Dental Advisory Given, Loose, Missing, Poor Dentition,    Pulmonary asthma , COPD,  oxygen dependent, former smoker,     + decreased breath sounds(-) wheezing      Cardiovascular hypertension, Pt. on medications Normal cardiovascular exam Rhythm:Regular Rate:Normal  Echo 12/2020 1. Technically difficult; if clinically indicated, TEE would have better sensitivity for vegetation.  2. Left ventricular ejection fraction, by estimation, is 60 to 65%. The left ventricle has normal function. The left ventricle has no regional wall motion abnormalities. Left ventricular diastolic parameters are consistent with Grade I diastolic dysfunction (impaired relaxation).  3. Right ventricular systolic function is normal. The right ventricular size is normal.  4. The mitral valve is normal in structure. No evidence of mitral valve regurgitation. No evidence of mitral stenosis.  5. The aortic valve was not well visualized. Aortic valve regurgitation is not visualized. No aortic stenosis is present.  6. The inferior vena cava is normal in size with greater than 50% respiratory variability, suggesting right atrial pressure of 3 mmHg.    Neuro/Psych Anxiety negative neurological ROS     GI/Hepatic Neg liver ROS, GERD  Medicated and Controlled,  Endo/Other  negative endocrine ROS  Renal/GU Renal disease     Musculoskeletal   Abdominal Normal abdominal exam  (+)   Peds  Hematology negative hematology ROS (+)   Anesthesia Other Findings   Reproductive/Obstetrics                           Lab Results  Component Value Date   WBC 8.5 01/02/2021    HGB 12.3 01/02/2021   HCT 39.5 01/02/2021   MCV 91.4 01/02/2021   PLT 145 (L) 01/02/2021   Lab Results  Component Value Date   CREATININE 1.07 (H) 01/02/2021   BUN 36 (H) 01/02/2021   NA 147 (H) 01/02/2021   K 4.3 01/02/2021   CL 111 01/02/2021   CO2 28 01/02/2021    Anesthesia Physical  Anesthesia Plan  ASA: 3  Anesthesia Plan: MAC   Post-op Pain Management:    Induction: Intravenous  PONV Risk Score and Plan: 3 and Ondansetron, Midazolam and Treatment may vary due to age or medical condition  Airway Management Planned: Natural Airway  Additional Equipment: None  Intra-op Plan:   Post-operative Plan:   Informed Consent: I have reviewed the patients History and Physical, chart, labs and discussed the procedure including the risks, benefits and alternatives for the proposed anesthesia with the patient or authorized representative who has indicated his/her understanding and acceptance.     Dental advisory given  Plan Discussed with: CRNA, Surgeon and Anesthesiologist  Anesthesia Plan Comments:        Anesthesia Quick Evaluation

## 2021-02-07 NOTE — Op Note (Signed)
See [facility] OP note scanned into chart. Also because of the size, density, location and other factors that cannot be anticipated I feel this will likely be a staged procedure. This fact supersedes any indication in the scanned [facility] operative note to the contrary.  

## 2021-02-07 NOTE — Anesthesia Postprocedure Evaluation (Signed)
Anesthesia Post Note  Patient: Brandy Morales  Procedure(s) Performed: LEFT EXTRACORPOREAL SHOCK WAVE LITHOTRIPSY (ESWL) (Left)     Patient location during evaluation: PACU Anesthesia Type: MAC Level of consciousness: awake and alert Pain management: pain level controlled Vital Signs Assessment: post-procedure vital signs reviewed and stable Respiratory status: spontaneous breathing Cardiovascular status: stable Anesthetic complications: no   No notable events documented.  Last Vitals:  Vitals:   02/07/21 1003 02/07/21 1210  BP: 128/78   Pulse: (!) 108 85  Resp: 18 (!) 22  Temp: 36.6 C (!) 36.1 C  SpO2: 96% 98%    Last Pain:  Vitals:   02/07/21 1210  TempSrc:   PainSc: 0-No pain                 Nolon Nations

## 2021-02-07 NOTE — H&P (Signed)
See scanned H&P

## 2021-02-08 ENCOUNTER — Encounter (HOSPITAL_BASED_OUTPATIENT_CLINIC_OR_DEPARTMENT_OTHER): Payer: Self-pay | Admitting: Urology

## 2021-02-11 DIAGNOSIS — E669 Obesity, unspecified: Secondary | ICD-10-CM | POA: Diagnosis not present

## 2021-02-11 DIAGNOSIS — Z9981 Dependence on supplemental oxygen: Secondary | ICD-10-CM | POA: Diagnosis not present

## 2021-02-11 DIAGNOSIS — I700 Atherosclerosis of aorta: Secondary | ICD-10-CM | POA: Diagnosis not present

## 2021-02-11 DIAGNOSIS — Z9181 History of falling: Secondary | ICD-10-CM | POA: Diagnosis not present

## 2021-02-11 DIAGNOSIS — I2510 Atherosclerotic heart disease of native coronary artery without angina pectoris: Secondary | ICD-10-CM | POA: Diagnosis not present

## 2021-02-11 DIAGNOSIS — F419 Anxiety disorder, unspecified: Secondary | ICD-10-CM | POA: Diagnosis not present

## 2021-02-11 DIAGNOSIS — K219 Gastro-esophageal reflux disease without esophagitis: Secondary | ICD-10-CM | POA: Diagnosis not present

## 2021-02-11 DIAGNOSIS — N179 Acute kidney failure, unspecified: Secondary | ICD-10-CM | POA: Diagnosis not present

## 2021-02-11 DIAGNOSIS — J432 Centrilobular emphysema: Secondary | ICD-10-CM | POA: Diagnosis not present

## 2021-02-11 DIAGNOSIS — Z87891 Personal history of nicotine dependence: Secondary | ICD-10-CM | POA: Diagnosis not present

## 2021-02-11 DIAGNOSIS — J9611 Chronic respiratory failure with hypoxia: Secondary | ICD-10-CM | POA: Diagnosis not present

## 2021-02-11 DIAGNOSIS — K5730 Diverticulosis of large intestine without perforation or abscess without bleeding: Secondary | ICD-10-CM | POA: Diagnosis not present

## 2021-02-11 DIAGNOSIS — I119 Hypertensive heart disease without heart failure: Secondary | ICD-10-CM | POA: Diagnosis not present

## 2021-02-11 DIAGNOSIS — K8020 Calculus of gallbladder without cholecystitis without obstruction: Secondary | ICD-10-CM | POA: Diagnosis not present

## 2021-02-11 DIAGNOSIS — C3491 Malignant neoplasm of unspecified part of right bronchus or lung: Secondary | ICD-10-CM | POA: Diagnosis not present

## 2021-02-11 DIAGNOSIS — N136 Pyonephrosis: Secondary | ICD-10-CM | POA: Diagnosis not present

## 2021-02-11 DIAGNOSIS — Z6829 Body mass index (BMI) 29.0-29.9, adult: Secondary | ICD-10-CM | POA: Diagnosis not present

## 2021-02-11 DIAGNOSIS — Z7951 Long term (current) use of inhaled steroids: Secondary | ICD-10-CM | POA: Diagnosis not present

## 2021-02-19 DIAGNOSIS — E782 Mixed hyperlipidemia: Secondary | ICD-10-CM | POA: Diagnosis not present

## 2021-02-19 DIAGNOSIS — J449 Chronic obstructive pulmonary disease, unspecified: Secondary | ICD-10-CM | POA: Diagnosis not present

## 2021-02-19 DIAGNOSIS — C3490 Malignant neoplasm of unspecified part of unspecified bronchus or lung: Secondary | ICD-10-CM | POA: Diagnosis not present

## 2021-02-19 DIAGNOSIS — I2510 Atherosclerotic heart disease of native coronary artery without angina pectoris: Secondary | ICD-10-CM | POA: Diagnosis not present

## 2021-02-19 DIAGNOSIS — D4100 Neoplasm of uncertain behavior of unspecified kidney: Secondary | ICD-10-CM | POA: Diagnosis not present

## 2021-02-19 DIAGNOSIS — J441 Chronic obstructive pulmonary disease with (acute) exacerbation: Secondary | ICD-10-CM | POA: Diagnosis not present

## 2021-02-19 DIAGNOSIS — E7800 Pure hypercholesterolemia, unspecified: Secondary | ICD-10-CM | POA: Diagnosis not present

## 2021-02-19 DIAGNOSIS — N132 Hydronephrosis with renal and ureteral calculous obstruction: Secondary | ICD-10-CM | POA: Diagnosis not present

## 2021-02-19 DIAGNOSIS — I10 Essential (primary) hypertension: Secondary | ICD-10-CM | POA: Diagnosis not present

## 2021-02-21 DIAGNOSIS — N201 Calculus of ureter: Secondary | ICD-10-CM | POA: Diagnosis not present

## 2021-02-26 DIAGNOSIS — F419 Anxiety disorder, unspecified: Secondary | ICD-10-CM | POA: Diagnosis not present

## 2021-02-26 DIAGNOSIS — Z9181 History of falling: Secondary | ICD-10-CM | POA: Diagnosis not present

## 2021-02-26 DIAGNOSIS — C3491 Malignant neoplasm of unspecified part of right bronchus or lung: Secondary | ICD-10-CM | POA: Diagnosis not present

## 2021-02-26 DIAGNOSIS — K8020 Calculus of gallbladder without cholecystitis without obstruction: Secondary | ICD-10-CM | POA: Diagnosis not present

## 2021-02-26 DIAGNOSIS — J432 Centrilobular emphysema: Secondary | ICD-10-CM | POA: Diagnosis not present

## 2021-02-26 DIAGNOSIS — N179 Acute kidney failure, unspecified: Secondary | ICD-10-CM | POA: Diagnosis not present

## 2021-02-26 DIAGNOSIS — J9611 Chronic respiratory failure with hypoxia: Secondary | ICD-10-CM | POA: Diagnosis not present

## 2021-02-26 DIAGNOSIS — N136 Pyonephrosis: Secondary | ICD-10-CM | POA: Diagnosis not present

## 2021-02-26 DIAGNOSIS — Z7951 Long term (current) use of inhaled steroids: Secondary | ICD-10-CM | POA: Diagnosis not present

## 2021-02-26 DIAGNOSIS — K219 Gastro-esophageal reflux disease without esophagitis: Secondary | ICD-10-CM | POA: Diagnosis not present

## 2021-02-26 DIAGNOSIS — Z9981 Dependence on supplemental oxygen: Secondary | ICD-10-CM | POA: Diagnosis not present

## 2021-02-26 DIAGNOSIS — K5730 Diverticulosis of large intestine without perforation or abscess without bleeding: Secondary | ICD-10-CM | POA: Diagnosis not present

## 2021-02-26 DIAGNOSIS — I119 Hypertensive heart disease without heart failure: Secondary | ICD-10-CM | POA: Diagnosis not present

## 2021-02-26 DIAGNOSIS — Z87891 Personal history of nicotine dependence: Secondary | ICD-10-CM | POA: Diagnosis not present

## 2021-02-26 DIAGNOSIS — E669 Obesity, unspecified: Secondary | ICD-10-CM | POA: Diagnosis not present

## 2021-02-26 DIAGNOSIS — I2510 Atherosclerotic heart disease of native coronary artery without angina pectoris: Secondary | ICD-10-CM | POA: Diagnosis not present

## 2021-02-26 DIAGNOSIS — Z6829 Body mass index (BMI) 29.0-29.9, adult: Secondary | ICD-10-CM | POA: Diagnosis not present

## 2021-02-26 DIAGNOSIS — I700 Atherosclerosis of aorta: Secondary | ICD-10-CM | POA: Diagnosis not present

## 2021-02-27 DIAGNOSIS — R300 Dysuria: Secondary | ICD-10-CM | POA: Diagnosis not present

## 2021-02-27 DIAGNOSIS — H349 Unspecified retinal vascular occlusion: Secondary | ICD-10-CM | POA: Diagnosis not present

## 2021-03-01 DIAGNOSIS — J449 Chronic obstructive pulmonary disease, unspecified: Secondary | ICD-10-CM | POA: Diagnosis not present

## 2021-03-19 DIAGNOSIS — E782 Mixed hyperlipidemia: Secondary | ICD-10-CM | POA: Diagnosis not present

## 2021-03-19 DIAGNOSIS — I2510 Atherosclerotic heart disease of native coronary artery without angina pectoris: Secondary | ICD-10-CM | POA: Diagnosis not present

## 2021-03-19 DIAGNOSIS — I10 Essential (primary) hypertension: Secondary | ICD-10-CM | POA: Diagnosis not present

## 2021-03-19 DIAGNOSIS — J449 Chronic obstructive pulmonary disease, unspecified: Secondary | ICD-10-CM | POA: Diagnosis not present

## 2021-03-19 DIAGNOSIS — N132 Hydronephrosis with renal and ureteral calculous obstruction: Secondary | ICD-10-CM | POA: Diagnosis not present

## 2021-03-19 DIAGNOSIS — E7800 Pure hypercholesterolemia, unspecified: Secondary | ICD-10-CM | POA: Diagnosis not present

## 2021-03-19 DIAGNOSIS — D4100 Neoplasm of uncertain behavior of unspecified kidney: Secondary | ICD-10-CM | POA: Diagnosis not present

## 2021-03-19 DIAGNOSIS — J441 Chronic obstructive pulmonary disease with (acute) exacerbation: Secondary | ICD-10-CM | POA: Diagnosis not present

## 2021-03-21 DIAGNOSIS — C3490 Malignant neoplasm of unspecified part of unspecified bronchus or lung: Secondary | ICD-10-CM | POA: Diagnosis not present

## 2021-03-21 DIAGNOSIS — I10 Essential (primary) hypertension: Secondary | ICD-10-CM | POA: Diagnosis not present

## 2021-03-21 DIAGNOSIS — E782 Mixed hyperlipidemia: Secondary | ICD-10-CM | POA: Diagnosis not present

## 2021-03-21 DIAGNOSIS — Z23 Encounter for immunization: Secondary | ICD-10-CM | POA: Diagnosis not present

## 2021-03-21 DIAGNOSIS — J449 Chronic obstructive pulmonary disease, unspecified: Secondary | ICD-10-CM | POA: Diagnosis not present

## 2021-03-21 DIAGNOSIS — I2510 Atherosclerotic heart disease of native coronary artery without angina pectoris: Secondary | ICD-10-CM | POA: Diagnosis not present

## 2021-03-21 DIAGNOSIS — N132 Hydronephrosis with renal and ureteral calculous obstruction: Secondary | ICD-10-CM | POA: Diagnosis not present

## 2021-04-08 DIAGNOSIS — M8580 Other specified disorders of bone density and structure, unspecified site: Secondary | ICD-10-CM | POA: Diagnosis not present

## 2021-04-08 DIAGNOSIS — Z01419 Encounter for gynecological examination (general) (routine) without abnormal findings: Secondary | ICD-10-CM | POA: Diagnosis not present

## 2021-04-10 ENCOUNTER — Encounter: Payer: Self-pay | Admitting: Pulmonary Disease

## 2021-04-10 ENCOUNTER — Ambulatory Visit (INDEPENDENT_AMBULATORY_CARE_PROVIDER_SITE_OTHER): Payer: PPO | Admitting: Pulmonary Disease

## 2021-04-10 ENCOUNTER — Other Ambulatory Visit: Payer: Self-pay

## 2021-04-10 VITALS — BP 116/82 | HR 120 | Temp 97.6°F | Ht 64.0 in | Wt 163.2 lb

## 2021-04-10 DIAGNOSIS — R942 Abnormal results of pulmonary function studies: Secondary | ICD-10-CM | POA: Diagnosis not present

## 2021-04-10 DIAGNOSIS — J449 Chronic obstructive pulmonary disease, unspecified: Secondary | ICD-10-CM

## 2021-04-10 DIAGNOSIS — J9611 Chronic respiratory failure with hypoxia: Secondary | ICD-10-CM | POA: Diagnosis not present

## 2021-04-10 DIAGNOSIS — R911 Solitary pulmonary nodule: Secondary | ICD-10-CM | POA: Diagnosis not present

## 2021-04-10 MED ORDER — BREZTRI AEROSPHERE 160-9-4.8 MCG/ACT IN AERO: 2.0000 | INHALATION_SPRAY | Freq: Two times a day (BID) | RESPIRATORY_TRACT | 0 refills | Status: DC

## 2021-04-10 NOTE — Patient Instructions (Signed)
Thank you for visiting Dr. Valeta Harms at [facility]. Today we recommend the following:  Follow up with Dr. Sondra Come in Jan with repeat CT  Samples of Breztri and a new prescription.   Return in about 6 months (around 10/08/2021) for with APP or Dr. Valeta Harms.    Please do your part to reduce the spread of COVID-19.

## 2021-04-10 NOTE — Addendum Note (Signed)
Addended by: Fran Lowes on: 04/10/2021 01:48 PM   Modules accepted: Orders

## 2021-04-10 NOTE — Progress Notes (Signed)
Synopsis: Referred in June 2021 for abnormal PET scan, Dr. Chase Caller, PCP: By [name], MD  Subjective:   PATIENT ID: Brandy Morales GENDER: female DOB: 1945/12/28, MRN: [id number]  Chief Complaint  Patient presents with   Follow-up     This is a 75 year old female past medical history of severe COPD, chronic oxygen dependent respiratory failure on 2 L pulse POC, asthma, hypertension.  Patient had CT scan imaging which revealed a right upper lobe pulmonary nodule abutting the major fissure.  Patient underwent nuclear medicine pet imaging which revealed a PET avid right upper lobe pulmonary nodule SUV 5.4.  Patient was referred for evaluation of navigational bronchoscopy and tissue sampling.  Patient felt to be not a good candidate for surgical resection due to poor lung function and functional status.  This was discussed with Dr. Chase Caller who is patient's primary pulmonologist.  OV 04/10/2021: I originally saw the patient in June 2021 for nodule consultation.  She was set up for bronchoscopy however declined due to concern for general anesthesia and ultimately sought radiation oncology in August 2021 and completed 54 Gray, 3 fractions of empiric SBRT to this highly suspicious nodule.  No tissue diagnosis was obtained.  Overall from a respiratory standpoint she is doing well.  She is using her portable oxygen.  She does feel like she may have had some decline in her functional status after hospitalization.  But we have not seen her in greater than a year.  She has been trying to stay at home is much as possible.     Past Medical History:  Diagnosis Date   Anxiety    Asthma    Cancer ([place])    COPD (chronic obstructive pulmonary disease) ([place])    GERD (gastroesophageal reflux disease)    History of kidney stones    History of radiation therapy 01/05/2020   IMRT right lung 54 Gy in 3 Fractions 12/29/2019 through 01/05/2020  Dr Gery Pray   Hypertension      No family history on  file.   Past Surgical History:  Procedure Laterality Date   ABDOMINAL HYSTERECTOMY  age 42   partial 1 ovary left   APPENDECTOMY     colonscopy     CYSTOSCOPY W/ URETERAL STENT PLACEMENT Left 12/30/2020   Procedure: CYSTOSCOPY WITH RETROGRADE PYELOGRAM/URETERAL STENT PLACEMENT;  Surgeon: Cleon Gustin, MD;  Location: [facility];  Service: Urology;  Laterality: Left;   CYSTOSCOPY WITH RETROGRADE PYELOGRAM, URETEROSCOPY AND STENT PLACEMENT N/A 11/30/2017   Procedure: CYSTOSCOPY WITH BILATERAL RETROGRADE PYELOGRAM, LEFT URETEROSCOPY/ LEFT LASER LITHO AND LEFT STENT PLACEMENT;  Surgeon: Lucas Mallow, MD;  Location: [facility];  Service: Urology;  Laterality: N/A;   CYSTOSCOPY WITH RETROGRADE PYELOGRAM, URETEROSCOPY AND STENT PLACEMENT Left 02/01/2018   Procedure: CYSTOSCOPY WITH LEFT RETROGRADE PYELOGRAM, URETEROSCOPY HOLMIUM LASER AND STENT PLACEMENT;  Surgeon: Lucas Mallow, MD;  Location: [facility];  Service: Urology;  Laterality: Left;   EXTRACORPOREAL SHOCK WAVE LITHOTRIPSY Left 02/07/2021   Procedure: LEFT EXTRACORPOREAL SHOCK WAVE LITHOTRIPSY (ESWL);  Surgeon: Lucas Mallow, MD;  Location: [facility];  Service: Urology;  Laterality: Left;  62 MINS FOR THIS CASE NEED CRNA ON TRUCK  PLEASE CLOSE ROOM 2 AT 11:15 FOR  THIS CASE   EYE SURGERY Bilateral    Cataract   TONSILLECTOMY      Social History   Socioeconomic History   Marital status: Widowed    Spouse name: Not on file   Number  of children: Not on file   Years of education: Not on file   Highest education level: Not on file  Occupational History   Not on file  Tobacco Use   Smoking status: Former    Packs/day: 2.00    Years: 55.00    Pack years: 110.00    Types: Cigarettes    Quit date: 2016    Years since quitting: 6.8   Smokeless tobacco: Never  Vaping Use   Vaping Use: Never used  Substance and Sexual Activity   Alcohol use: Yes    Alcohol/week: 1.0 standard drink    Types: 1 Glasses of wine  per week    Comment: weekly   Drug use: Never   Sexual activity: Not Currently  Other Topics Concern   Not on file  Social History Narrative   Not on file   Social Determinants of Health   Financial Resource Strain: Not on file  Food Insecurity: Not on file  Transportation Needs: Not on file  Physical Activity: Not on file  Stress: Not on file  Social Connections: Not on file  Intimate Partner Violence: Not on file     Allergies  Allergen Reactions   Statins Cough   Latex Other (See Comments)    Broke out on face   Morphine And Related Other (See Comments)    Describes: "feel like I lose control of my body."   Oxycodone Other (See Comments)    Describes: "feels like I lose control of my body"     Outpatient Medications Prior to Visit  Medication Sig Dispense Refill   acyclovir (ZOVIRAX) 400 MG tablet Take 1 tablet (400 mg total) by mouth 3 (three) times daily. 12 tablet 0   albuterol (VENTOLIN HFA) 108 (90 Base) MCG/ACT inhaler Inhale 2 puffs into the lungs every 6 (six) hours as needed for wheezing or shortness of breath.     Artificial Tear Solution (GENTEAL TEARS OP) Place 1 drop into both eyes daily.      atorvastatin (LIPITOR) 10 MG tablet Take 5 mg by mouth 3 (three) times a week. Take on MWF     BREO ELLIPTA 200-25 MCG/INH AEPB Inhale 1 puff into the lungs every evening.     cetirizine (ZYRTEC) 10 MG tablet Take 10 mg by mouth daily.     Cholecalciferol (VITAMIN D3) 5000 units CAPS Take 5,000 Units by mouth daily.     citalopram (CELEXA) 20 MG tablet Take 20 mg by mouth daily.     ezetimibe (ZETIA) 10 MG tablet Take 10 mg by mouth at bedtime.     fluticasone (FLONASE) 50 MCG/ACT nasal spray Place 1 spray into both nostrils daily.      losartan-hydrochlorothiazide (HYZAAR) 100-25 MG tablet Take 0.5 tablets by mouth daily.     omeprazole (PRILOSEC) 20 MG capsule Take 20 mg by mouth daily.      OXYGEN Inhale 2 L into the lungs continuous.      tiotropium (SPIRIVA) 18  MCG inhalation capsule Place 1 capsule (18 mcg total) into inhaler and inhale daily. 30 capsule 6   HYDROcodone-acetaminophen (NORCO/VICODIN) 5-325 MG tablet Take 1 tablet by mouth every 4 (four) hours as needed for moderate pain. 8 tablet 0   lidocaine (LIDODERM) 5 % Place 1 patch onto the skin daily. Remove & Discard patch within 12 hours or as directed by MD (Patient taking differently: Place 1 patch onto the skin daily. Remove & Discard patch within 12 hours or as directed by  MD; patient does not use) 30 patch 0   tamsulosin (FLOMAX) 0.4 MG CAPS capsule Take 0.4 mg by mouth daily.     tamsulosin (FLOMAX) 0.4 MG CAPS capsule Take 1 capsule (0.4 mg total) by mouth daily. 15 capsule 1   No facility-administered medications prior to visit.    Review of Systems  Constitutional:  Negative for chills, fever, malaise/fatigue and weight loss.  HENT:  Negative for hearing loss, sore throat and tinnitus.   Eyes:  Negative for blurred vision and double vision.  Respiratory:  Positive for shortness of breath. Negative for cough, hemoptysis, sputum production, wheezing and stridor.   Cardiovascular:  Negative for chest pain, palpitations, orthopnea, leg swelling and PND.  Gastrointestinal:  Negative for abdominal pain, constipation, diarrhea, heartburn, nausea and vomiting.  Genitourinary:  Negative for dysuria, hematuria and urgency.  Musculoskeletal:  Negative for joint pain and myalgias.  Skin:  Negative for itching and rash.  Neurological:  Positive for weakness. Negative for dizziness, tingling and headaches.  Endo/Heme/Allergies:  Negative for environmental allergies. Does not bruise/bleed easily.  Psychiatric/Behavioral:  Negative for depression. The patient is not nervous/anxious and does not have insomnia.   All other systems reviewed and are negative.   Objective:  Physical Exam Vitals reviewed.  Constitutional:      General: She is not in acute distress.    Appearance: She is  well-developed.  HENT:     Head: Normocephalic and atraumatic.  Eyes:     General: No scleral icterus.    Conjunctiva/sclera: Conjunctivae normal.     Pupils: Pupils are equal, round, and reactive to light.  Neck:     Vascular: No JVD.     Trachea: No tracheal deviation.  Cardiovascular:     Rate and Rhythm: Normal rate and regular rhythm.     Heart sounds: Normal heart sounds. No murmur heard. Pulmonary:     Effort: Pulmonary effort is normal. No tachypnea, accessory muscle usage or respiratory distress.     Breath sounds: No stridor. No wheezing, rhonchi or rales.     Comments: Diminished breath sounds bilaterally no wheeze Abdominal:     General: Bowel sounds are normal. There is no distension.     Palpations: Abdomen is soft.     Tenderness: There is no abdominal tenderness.  Musculoskeletal:        General: No tenderness.     Cervical back: Neck supple.  Lymphadenopathy:     Cervical: No cervical adenopathy.  Skin:    General: Skin is warm and dry.     Capillary Refill: Capillary refill takes less than 2 seconds.     Findings: No rash.  Neurological:     Mental Status: She is alert and oriented to person, place, and time.  Psychiatric:        Behavior: Behavior normal.     Vitals:   04/10/21 1107  BP: 116/82  Pulse: (!) 120  Temp: 97.6 F (36.4 C)  TempSrc: Oral  SpO2: 95%  Weight: 163 lb 3.2 oz (74 kg)  Height: 5\' 4"  (1.626 m)    95% on  RA BMI Readings from Last 3 Encounters:  04/10/21 28.01 kg/m  02/07/21 28.53 kg/m  12/29/20 32.22 kg/m   Wt Readings from Last 3 Encounters:  04/10/21 163 lb 3.2 oz (74 kg)  02/07/21 166 lb 3.2 oz (75.4 kg)  12/29/20 176 lb 2.4 oz (79.9 kg)     CBC    Component Value Date/Time   WBC 8.5  01/02/2021 0503   RBC 4.40 01/02/2021 0503   RBC 4.32 01/02/2021 0503   HGB 12.3 01/02/2021 0503   HCT 39.5 01/02/2021 0503   PLT 145 (L) 01/02/2021 0503   MCV 91.4 01/02/2021 0503   MCH 28.5 01/02/2021 0503   MCHC 31.1  01/02/2021 0503   RDW 14.3 01/02/2021 0503   LYMPHSABS 0.5 (L) 12/29/2020 0657   MONOABS 0.8 12/29/2020 0657   EOSABS 0.0 12/29/2020 0657   BASOSABS 0.0 12/29/2020 0657     Chest Imaging: 09/14/2019 CT chest: Right upper lobe spiculated pulmonary nodule abutting major fissure. The patient's images have been independently reviewed by me.    10/12/2019 nuclear medicine pet imaging. Right upper lobe PET avid pulmonary nodule SUV 5.45, 11 mm in largest cross-section, mild tenting of the fissure line. Concerning for primary bronchogenic carcinoma. The patient's images have been independently reviewed by me.    11/30/2020 CT chest: Treated nodule right upper lobe decreased size post radiation treatments Enlarging 7 mm peribronchovascular nodule in the left lower lobe. The patient's images have been independently reviewed by me.     Pulmonary Functions Testing Results: PFT Results Latest Ref Rng & Units 05/14/2017  FVC-Pre L 1.50  FVC-Predicted Pre % 48  Pre FEV1/FVC % % 42  FEV1-Pre L 0.63  FEV1-Predicted Pre % 26  DLCO uncorrected ml/min/mmHg 9.57  DLCO UNC% % 37  DLCO corrected ml/min/mmHg 9.09  DLCO COR %Predicted % 35  DLVA Predicted % 45  TLC L 9.98  TLC % Predicted % 192  RV % Predicted % 366    FeNO: none  Pathology: none  Echocardiogram: none  Heart Catheterization: none    Assessment & Plan:     ICD-10-CM   1. Nodule of right lung  R91.1     2. Abnormal PET of right lung  R94.2     3. Stage 4 very severe COPD by GOLD classification ([place])  J44.9     4. Chronic respiratory failure with hypoxia (HCC)  J96.11     5. Left lower lobe pulmonary nodule  R91.1        Discussion: This is a 75 year old female, advanced stage COPD, chronic hypoxemic respiratory failure on triple therapy inhaler regimen Breo plus Spiriva.  Overall doing okay from a respiratory standpoint.  She did tolerate SBRT for an empiric right lung nodule.  She was afraid of undergoing general  anesthesia felt to be high risk for going off to sleep.  Opted to have empiric radiation treatments with radiation oncology.  Plan: Continue treatments for her advanced COPD. We discussed potentially changing her inhaler regimen to Breztri 2 puffs twice daily. New prescription given for this today. We do not have any samples today in the office unfortunately. Also discussed it would be helpful to use this with a spacer. Otherwise she can continue her current inhaler regimen as she has been stable on this. She does like twice a day use inhalers.  So she uses her Breo in the morning and Spiriva in the afternoon. I think [name] would give her 1 inhaler to pay for potentially help reduce the cost because she is having to pay for both inhalers currently. Reviewed patient CT imaging from July, she does have new lower lobe left nodule that is slowly enlarging. Follow-up with Dr. Sondra Come in January with repeat CT.    Current Outpatient Medications:    acyclovir (ZOVIRAX) 400 MG tablet, Take 1 tablet (400 mg total) by mouth 3 (three) times daily., Disp:  12 tablet, Rfl: 0   albuterol (VENTOLIN HFA) 108 (90 Base) MCG/ACT inhaler, Inhale 2 puffs into the lungs every 6 (six) hours as needed for wheezing or shortness of breath., Disp: , Rfl:    Artificial Tear Solution (GENTEAL TEARS OP), Place 1 drop into both eyes daily. , Disp: , Rfl:    atorvastatin (LIPITOR) 10 MG tablet, Take 5 mg by mouth 3 (three) times a week. Take on MWF, Disp: , Rfl:    BREO ELLIPTA 200-25 MCG/INH AEPB, Inhale 1 puff into the lungs every evening., Disp: , Rfl:    cetirizine (ZYRTEC) 10 MG tablet, Take 10 mg by mouth daily., Disp: , Rfl:    Cholecalciferol (VITAMIN D3) 5000 units CAPS, Take 5,000 Units by mouth daily., Disp: , Rfl:    citalopram (CELEXA) 20 MG tablet, Take 20 mg by mouth daily., Disp: , Rfl:    ezetimibe (ZETIA) 10 MG tablet, Take 10 mg by mouth at bedtime., Disp: , Rfl:    fluticasone (FLONASE) 50 MCG/ACT  nasal spray, Place 1 spray into both nostrils daily. , Disp: , Rfl:    losartan-hydrochlorothiazide (HYZAAR) 100-25 MG tablet, Take 0.5 tablets by mouth daily., Disp: , Rfl:    omeprazole (PRILOSEC) 20 MG capsule, Take 20 mg by mouth daily. , Disp: , Rfl:    OXYGEN, Inhale 2 L into the lungs continuous. , Disp: , Rfl:    tiotropium (SPIRIVA) 18 MCG inhalation capsule, Place 1 capsule (18 mcg total) into inhaler and inhale daily., Disp: 30 capsule, Rfl: 6   Garner Nash, DO [facility] 04/10/2021 11:11 AM

## 2021-04-11 ENCOUNTER — Other Ambulatory Visit: Payer: Self-pay | Admitting: Nurse Practitioner

## 2021-04-11 DIAGNOSIS — M8580 Other specified disorders of bone density and structure, unspecified site: Secondary | ICD-10-CM

## 2021-04-11 DIAGNOSIS — Z1231 Encounter for screening mammogram for malignant neoplasm of breast: Secondary | ICD-10-CM

## 2021-04-30 ENCOUNTER — Telehealth: Payer: Self-pay | Admitting: Pulmonary Disease

## 2021-04-30 MED ORDER — BREZTRI AEROSPHERE 160-9-4.8 MCG/ACT IN AERO: 2.0000 | INHALATION_SPRAY | Freq: Two times a day (BID) | RESPIRATORY_TRACT | 5 refills | Status: DC

## 2021-04-30 NOTE — Telephone Encounter (Signed)
Rx for Brandy Morales has been sent to pt's preferred pharmacy. Called and spoke with pt letting her know this had been done and she verbalized understanding. Nothing further needed.

## 2021-05-07 ENCOUNTER — Telehealth: Payer: Self-pay | Admitting: *Deleted

## 2021-05-07 NOTE — Telephone Encounter (Signed)
Called patient to inform of CT for 06-07-21- arrival time- 12:45 pm @ [facility], no restrictions to test, patient to receive results from Dr. Sondra Come on 06-10-21 @ 10 am, spoke with patient and she is aware of these appts.

## 2021-05-23 ENCOUNTER — Ambulatory Visit: Payer: PPO

## 2021-06-07 ENCOUNTER — Ambulatory Visit (HOSPITAL_COMMUNITY)
Admission: RE | Admit: 2021-06-07 | Discharge: 2021-06-07 | Disposition: A | Discharge status: Home / Self Care | Payer: PPO | Source: Ambulatory Visit | Attending: Radiation Oncology | Admitting: Radiation Oncology

## 2021-06-07 ENCOUNTER — Other Ambulatory Visit: Payer: Self-pay

## 2021-06-07 DIAGNOSIS — R918 Other nonspecific abnormal finding of lung field: Secondary | ICD-10-CM | POA: Diagnosis not present

## 2021-06-07 DIAGNOSIS — R911 Solitary pulmonary nodule: Secondary | ICD-10-CM | POA: Diagnosis not present

## 2021-06-07 DIAGNOSIS — C3490 Malignant neoplasm of unspecified part of unspecified bronchus or lung: Secondary | ICD-10-CM | POA: Diagnosis not present

## 2021-06-07 DIAGNOSIS — I700 Atherosclerosis of aorta: Secondary | ICD-10-CM | POA: Diagnosis not present

## 2021-06-07 DIAGNOSIS — J439 Emphysema, unspecified: Secondary | ICD-10-CM | POA: Diagnosis not present

## 2021-06-09 NOTE — Progress Notes (Signed)
Radiation Oncology         [phone] ________________________________  Name: Brandy Morales MRN: [id number]  Date: 06/10/2021  DOB: 01/05/1946  Follow-Up Visit Note  CC: Brandy Huddle, MD  Brandy Males, MD    ICD-10-CM   1. Pulmonary nodules  R91.8 CT CHEST WO CONTRAST      Diagnosis: PET avid right upper lobe spiculated pulmonary nodule concerning for primary bronchogenic carcinoma   Interval Since Last Radiation: 1 year, 5 months, and 4 days   Radiation Treatment Dates: 12/29/2019 through 01/05/2020 Site Technique Total Dose (Gy) Dose per Fx (Gy) Completed Fx Beam Energies  Lung, Right: Lung_Rt SBRT 54/54 18 3/3 6XFFF    Narrative:  The patient returns today for routine follow-up and to review recent imaging. She was last seen here for follow up on 12/06/20.   Since then, the patient presented to the ED for admission on 12/28/20 with progressive generalized weakness and left back pain for 4 days. She also reported associated nausea, emesis, and poor po intake. CT of the abdomen and pelvis performed for evaluation of flank pain revealed a mild 6 mm left UPJ calculus with resulting moderate hydronephrosis. CT also showed cholelithiasis with interval distention of gallbladder (likely chronic). Subsequently, the patient underwent left ureteral stent placement on 12/30/20 while admitted. Labs performed during hospital course also revealed E. Coli bacteremia. The patient was accordingly treated with a course of cefdinir 300 mg PO every 12 hours. She was discharged on 01/03/21 with instruction to follow up with OP urology for stone management.   The patient proceeded to undergo left ESWL on 02/07/21.   The patient followed up with Brandy Morales on 04/10/21. During which time, the patient was noted to be doing well from a respiratory standpoint and using her supplemental portable oxygen. The patient did note that she may have had some decline in her functional status after  hospitalization. Only change made to the patients medication regimen during this visit was a new Rx for Breztri (x2 puffs BID).  Chest CT on 06/07/21 demonstrated no significant change in post treatment appearance of a spiculated nodule of the posterior right upper lobe, measuring 0.8 x 0.7 cm. Additional small nodules of the right upper lobe were appreciated as unchanged, and noted as likely benign and incidental. Lastly, resolution was seen of the previously seen peribronchovascular nodule in the left lower lobe, consistent with nonspecific infection or inflammation.         Allergies:  is allergic to statins, latex, morphine and related, and oxycodone.  Meds: Current Outpatient Medications  Medication Sig Dispense Refill   acyclovir (ZOVIRAX) 400 MG tablet Take 1 tablet (400 mg total) by mouth 3 (three) times daily. 12 tablet 0   albuterol (ACCUNEB) 1.25 MG/3ML nebulizer solution Take 1 ampule by nebulization every 6 (six) hours as needed for wheezing.     albuterol (VENTOLIN HFA) 108 (90 Base) MCG/ACT inhaler Inhale 2 puffs into the lungs every 6 (six) hours as needed for wheezing or shortness of breath.     Artificial Tear Solution (GENTEAL TEARS OP) Place 1 drop into both eyes daily.      atorvastatin (LIPITOR) 10 MG tablet Take 5 mg by mouth 3 (three) times a week. Take on MWF     Budeson-Glycopyrrol-Formoterol (BREZTRI AEROSPHERE) 160-9-4.8 MCG/ACT AERO Inhale 2 puffs into the lungs in the morning and at bedtime. 10.7 g 5   cetirizine (ZYRTEC) 10 MG tablet Take 10 mg by mouth daily.  Cholecalciferol (VITAMIN D3) 5000 units CAPS Take 5,000 Units by mouth daily.     citalopram (CELEXA) 20 MG tablet Take 20 mg by mouth daily.     ezetimibe (ZETIA) 10 MG tablet Take 10 mg by mouth at bedtime.     fluticasone (FLONASE) 50 MCG/ACT nasal spray Place 1 spray into both nostrils daily.      losartan-hydrochlorothiazide (HYZAAR) 100-25 MG tablet Take 0.5 tablets by mouth daily.      omeprazole (PRILOSEC) 20 MG capsule Take 20 mg by mouth daily.      OXYGEN Inhale 2 L into the lungs continuous.      No current facility-administered medications for this encounter.    Physical Findings: The patient is in no acute distress. Patient is alert and oriented.  height is 5\' 4"  (1.626 m) and weight is 165 lb 9.6 oz (75.1 kg). Her temperature is 97.6 F (36.4 C). Her blood pressure is 134/63 and her pulse is 114 (abnormal). Her respiration is 24 (abnormal) and oxygen saturation is 94%. .  Lungs are clear to auscultation bilaterally. Heart has regular rate and rhythm. No palpable cervical, supraclavicular, or axillary adenopathy. Abdomen soft, non-tender, normal bowel sounds.  Supplemental oxygen in place at 2 L.   Lab Findings: Lab Results  Component Value Date   WBC 8.5 01/02/2021   HGB 12.3 01/02/2021   HCT 39.5 01/02/2021   MCV 91.4 01/02/2021   PLT 145 (L) 01/02/2021    Radiographic Findings: CT CHEST WO CONTRAST  Result Date: 06/08/2021 CLINICAL DATA:  Non-small cell lung cancer, assess treatment response, right upper lobe lung status post XRT, former smoker EXAM: CT CHEST WITHOUT CONTRAST TECHNIQUE: Multidetector CT imaging of the chest was performed following the standard protocol without IV contrast. RADIATION DOSE REDUCTION: This exam was performed according to the departmental dose-optimization program which includes automated exposure control, adjustment of the mA and/or kV according to patient size and/or use of iterative reconstruction technique. COMPARISON:  11/30/2020 FINDINGS: Cardiovascular: Aortic atherosclerosis. Normal heart size. Three-vessel coronary artery calcifications no pericardial effusion. Mediastinum/Nodes: No enlarged mediastinal, hilar, or axillary lymph nodes. Thyroid gland, trachea, and esophagus demonstrate no significant findings. 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 No significant change in post treatment appearance of a  spiculated nodule of the posterior right upper lobe measuring 0.8 x 0.7 cm (series 5, image 71). Additional bandlike scarring of the medial right upper lobe, medial segment right middle lobe, and lingula (series 5, image 111). Unchanged irregular subpleural nodule of the anterior right upper lobe, measuring 0.5 cm (series 5, image 45). Unchanged 0.3 cm nodule of the right apex (series 5, image 37). Previously seen peribronchovascular nodule in the left lower lobe is resolved and not appreciated on current examination (series 5, image 117). No pleural effusion or pneumothorax. Upper Abdomen: No acute abnormality. Multiple fluid attenuation liver lesions, unchanged and consistent with cysts or hemangiomata. Stable, benign left adrenal adenoma (series 2, image 178). Musculoskeletal: No chest wall abnormality. No suspicious osseous lesions identified. IMPRESSION: 1. No significant change in post treatment appearance of a spiculated nodule of the posterior right upper lobe measuring 0.8 x 0.7 cm. 2. Additional small nodules of the right upper lobe are unchanged, likely benign and incidental. Continued attention on follow-up. 3. Previously seen peribronchovascular nodule in the left lower lobe is resolved, consistent with nonspecific infection or inflammation. 4. 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 5. Coronary artery disease. Aortic Atherosclerosis (ICD10-I70.0) and Emphysema (ICD10-J43.9). Electronically Signed   By: Delanna Ahmadi  M.D.   On: 06/08/2021 16:17    Impression: PET avid right upper lobe spiculated pulmonary nodule concerning for primary bronchogenic carcinoma   No evidence of recurrence on physical exam today.  Recent chest CT scan shows favorable response to SBRT with no scar tissue enlargement or other tumors noted.  Plan: Routine CT scan of the chest in 6 months.  The patient will follow-up with me for physical exam and to review results at that time.   20 minutes of total time was spent  for this patient encounter, including preparation, face-to-face counseling with the patient and coordination of care, physical exam, and documentation of the encounter. ____________________________________  Blair Promise, PhD, MD  This document serves as a record of services personally performed by Gery Pray, MD. It was created on his behalf by Roney Mans, a trained medical scribe. The creation of this record is based on the scribe's personal observations and the provider's statements to them. This document has been checked and approved by the attending provider.

## 2021-06-10 ENCOUNTER — Ambulatory Visit
Admission: RE | Admit: 2021-06-10 | Discharge: 2021-06-10 | Disposition: A | Discharge status: Home / Self Care | Payer: PPO | Source: Ambulatory Visit | Attending: Radiation Oncology | Admitting: Radiation Oncology

## 2021-06-10 ENCOUNTER — Other Ambulatory Visit: Payer: Self-pay

## 2021-06-10 ENCOUNTER — Encounter: Payer: Self-pay | Admitting: Radiation Oncology

## 2021-06-10 VITALS — BP 134/63 | HR 114 | Temp 97.6°F | Resp 24 | Ht 64.0 in | Wt 165.6 lb

## 2021-06-10 DIAGNOSIS — Z923 Personal history of irradiation: Secondary | ICD-10-CM | POA: Diagnosis not present

## 2021-06-10 DIAGNOSIS — R918 Other nonspecific abnormal finding of lung field: Secondary | ICD-10-CM | POA: Diagnosis not present

## 2021-06-10 DIAGNOSIS — I700 Atherosclerosis of aorta: Secondary | ICD-10-CM | POA: Diagnosis not present

## 2021-06-10 DIAGNOSIS — I2510 Atherosclerotic heart disease of native coronary artery without angina pectoris: Secondary | ICD-10-CM | POA: Insufficient documentation

## 2021-06-10 DIAGNOSIS — Z8709 Personal history of other diseases of the respiratory system: Secondary | ICD-10-CM | POA: Diagnosis not present

## 2021-06-10 DIAGNOSIS — Z79899 Other long term (current) drug therapy: Secondary | ICD-10-CM | POA: Insufficient documentation

## 2021-06-10 DIAGNOSIS — J432 Centrilobular emphysema: Secondary | ICD-10-CM | POA: Insufficient documentation

## 2021-06-10 DIAGNOSIS — Z08 Encounter for follow-up examination after completed treatment for malignant neoplasm: Secondary | ICD-10-CM | POA: Diagnosis not present

## 2021-06-10 NOTE — Progress Notes (Signed)
Saul Dorsi is here today for follow up post radiation to the lung.  Lung Side: right  Completed radiation treatment on: 01/05/2020  Does the patient complain of any of the following: Pain:denies Shortness of breath w/wo exertion: shortness of breath with exertion  Cough: occasional cough with occasional white phlegm Hemoptysis: denies Pain with swallowing: denies Swallowing/choking concerns: denies Appetite: good Energy Level: low Post radiation skin Changes: denies    Additional comments if applicable: O2 2L pnc, recent UTI, kidney stone, decreased fluid intake  Vitals:   06/10/21 0952  BP: 134/63  Pulse: (!) 114  Resp: (!) 24  Temp: 97.6 F (36.4 C)  SpO2: 94%  Weight: 165 lb 9.6 oz (75.1 kg)  Height: 5\' 4"  (1.626 m)  PF: (!) 2 L/min

## 2021-06-12 DIAGNOSIS — I739 Peripheral vascular disease, unspecified: Secondary | ICD-10-CM | POA: Diagnosis not present

## 2021-06-12 DIAGNOSIS — Z7951 Long term (current) use of inhaled steroids: Secondary | ICD-10-CM | POA: Diagnosis not present

## 2021-06-12 DIAGNOSIS — Z9981 Dependence on supplemental oxygen: Secondary | ICD-10-CM | POA: Diagnosis not present

## 2021-06-12 DIAGNOSIS — Z923 Personal history of irradiation: Secondary | ICD-10-CM | POA: Diagnosis not present

## 2021-06-12 DIAGNOSIS — Z85118 Personal history of other malignant neoplasm of bronchus and lung: Secondary | ICD-10-CM | POA: Diagnosis not present

## 2021-06-12 DIAGNOSIS — Z87891 Personal history of nicotine dependence: Secondary | ICD-10-CM | POA: Diagnosis not present

## 2021-06-12 DIAGNOSIS — J449 Chronic obstructive pulmonary disease, unspecified: Secondary | ICD-10-CM | POA: Diagnosis not present

## 2021-06-19 DIAGNOSIS — M5431 Sciatica, right side: Secondary | ICD-10-CM | POA: Diagnosis not present

## 2021-06-19 DIAGNOSIS — R0902 Hypoxemia: Secondary | ICD-10-CM | POA: Diagnosis not present

## 2021-06-19 DIAGNOSIS — E559 Vitamin D deficiency, unspecified: Secondary | ICD-10-CM | POA: Diagnosis not present

## 2021-06-19 DIAGNOSIS — C3490 Malignant neoplasm of unspecified part of unspecified bronchus or lung: Secondary | ICD-10-CM | POA: Diagnosis not present

## 2021-06-19 DIAGNOSIS — N390 Urinary tract infection, site not specified: Secondary | ICD-10-CM | POA: Diagnosis not present

## 2021-06-19 DIAGNOSIS — Z79899 Other long term (current) drug therapy: Secondary | ICD-10-CM | POA: Diagnosis not present

## 2021-06-19 DIAGNOSIS — E7800 Pure hypercholesterolemia, unspecified: Secondary | ICD-10-CM | POA: Diagnosis not present

## 2021-06-19 DIAGNOSIS — K219 Gastro-esophageal reflux disease without esophagitis: Secondary | ICD-10-CM | POA: Diagnosis not present

## 2021-06-19 DIAGNOSIS — I10 Essential (primary) hypertension: Secondary | ICD-10-CM | POA: Diagnosis not present

## 2021-06-19 DIAGNOSIS — J309 Allergic rhinitis, unspecified: Secondary | ICD-10-CM | POA: Diagnosis not present

## 2021-06-19 DIAGNOSIS — Z87442 Personal history of urinary calculi: Secondary | ICD-10-CM | POA: Diagnosis not present

## 2021-06-19 DIAGNOSIS — J449 Chronic obstructive pulmonary disease, unspecified: Secondary | ICD-10-CM | POA: Diagnosis not present

## 2021-06-20 DIAGNOSIS — I10 Essential (primary) hypertension: Secondary | ICD-10-CM | POA: Diagnosis not present

## 2021-06-20 DIAGNOSIS — J449 Chronic obstructive pulmonary disease, unspecified: Secondary | ICD-10-CM | POA: Diagnosis not present

## 2021-06-20 DIAGNOSIS — E782 Mixed hyperlipidemia: Secondary | ICD-10-CM | POA: Diagnosis not present

## 2021-06-20 DIAGNOSIS — E7800 Pure hypercholesterolemia, unspecified: Secondary | ICD-10-CM | POA: Diagnosis not present

## 2021-06-20 DIAGNOSIS — J441 Chronic obstructive pulmonary disease with (acute) exacerbation: Secondary | ICD-10-CM | POA: Diagnosis not present

## 2021-06-20 DIAGNOSIS — K219 Gastro-esophageal reflux disease without esophagitis: Secondary | ICD-10-CM | POA: Diagnosis not present

## 2021-06-24 DIAGNOSIS — Z1231 Encounter for screening mammogram for malignant neoplasm of breast: Secondary | ICD-10-CM | POA: Diagnosis not present

## 2021-06-24 DIAGNOSIS — M85851 Other specified disorders of bone density and structure, right thigh: Secondary | ICD-10-CM | POA: Diagnosis not present

## 2021-06-24 DIAGNOSIS — M810 Age-related osteoporosis without current pathological fracture: Secondary | ICD-10-CM | POA: Diagnosis not present

## 2021-06-24 DIAGNOSIS — Z780 Asymptomatic menopausal state: Secondary | ICD-10-CM | POA: Diagnosis not present

## 2021-07-30 IMAGING — CR DG HAND COMPLETE 3+V*L*
3 series · 3 of 3 positions shown · non-contrast
Comparison: None.

CLINICAL DATA: Recent injury with abrasions, initial encounter

EXAM:
LEFT HAND - COMPLETE 3+ VIEW

[x hand pa left]
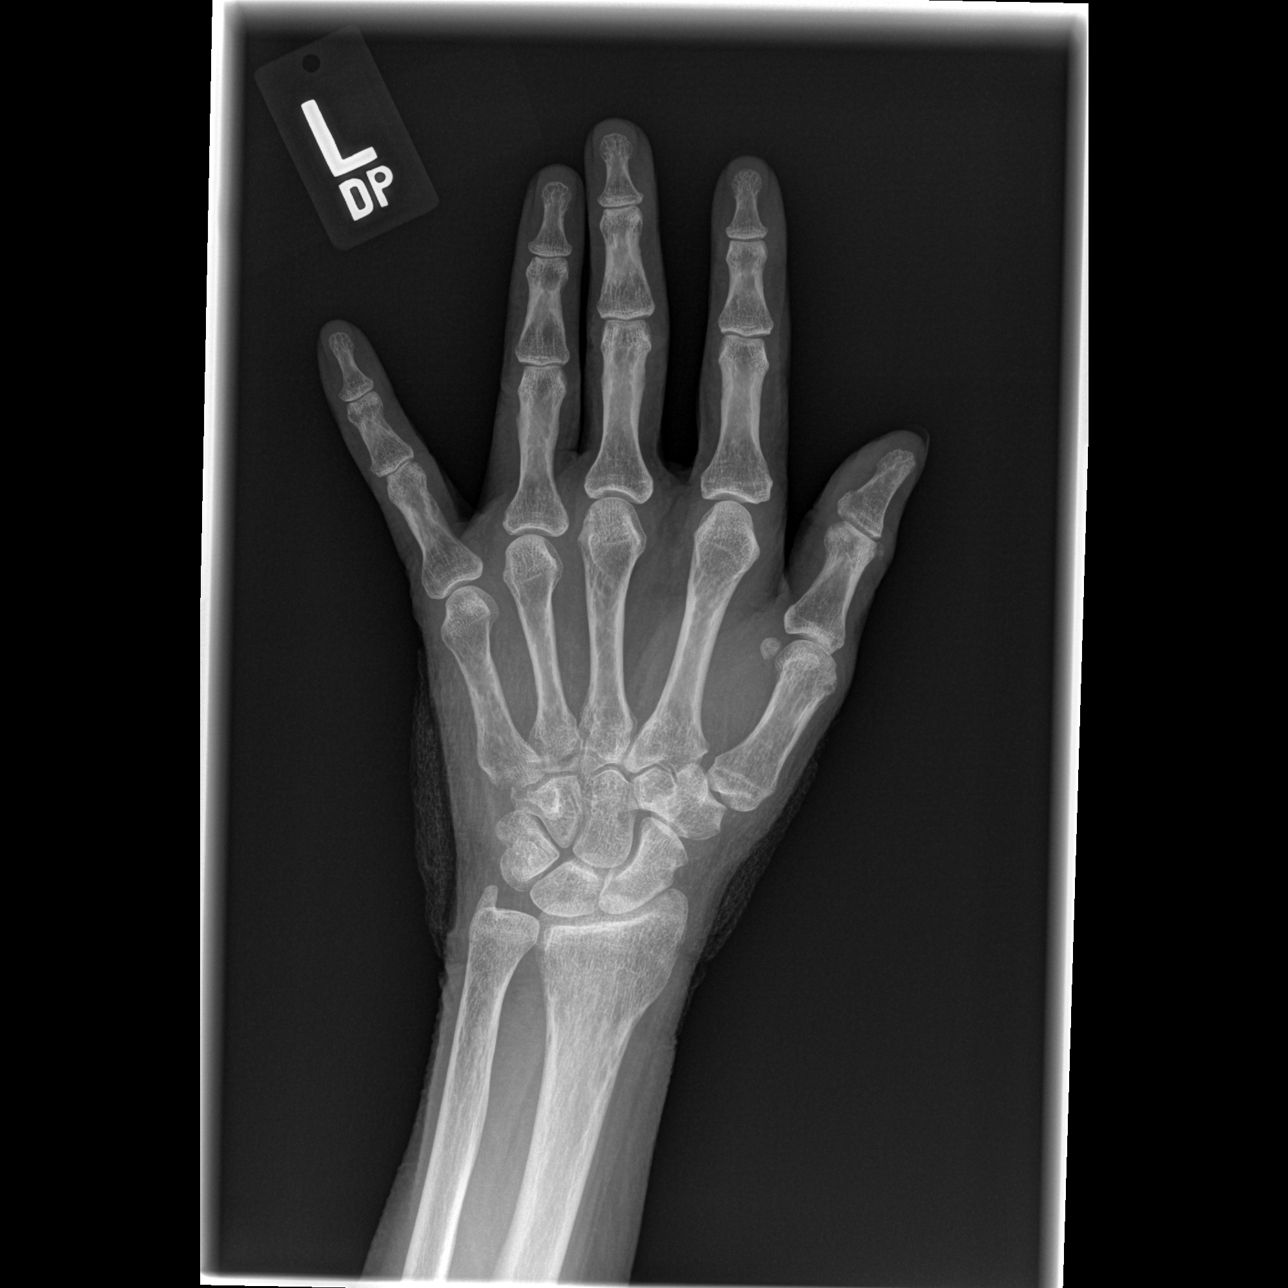

[x hand oblique left]
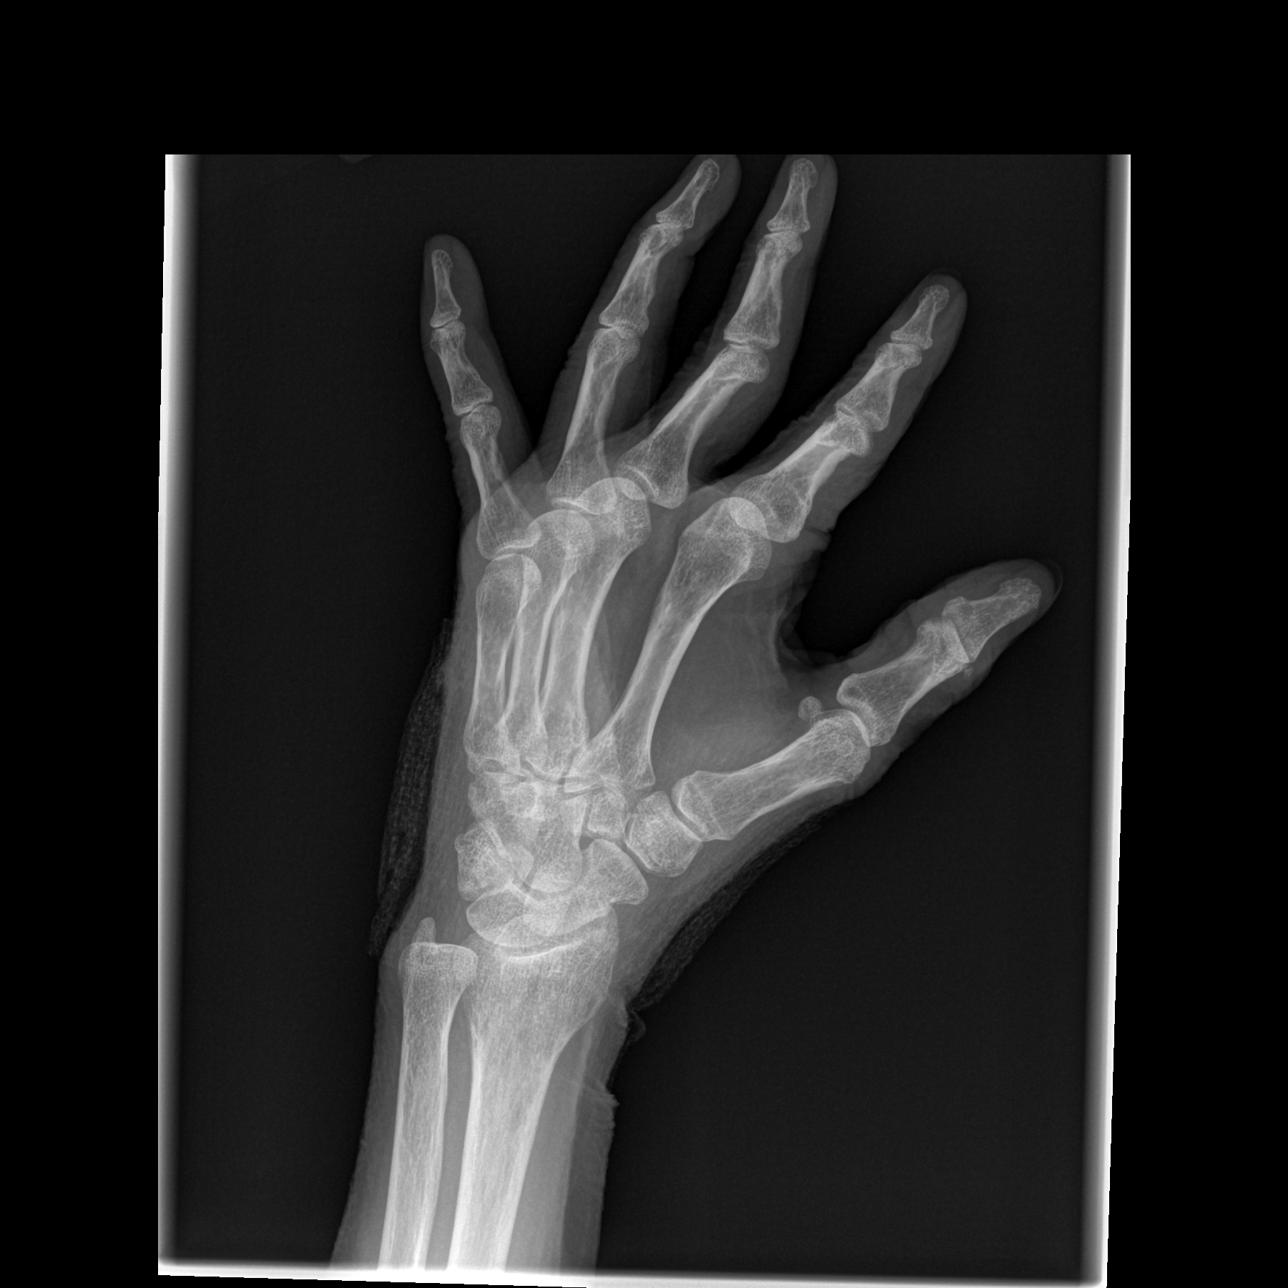

[x hand lat left]
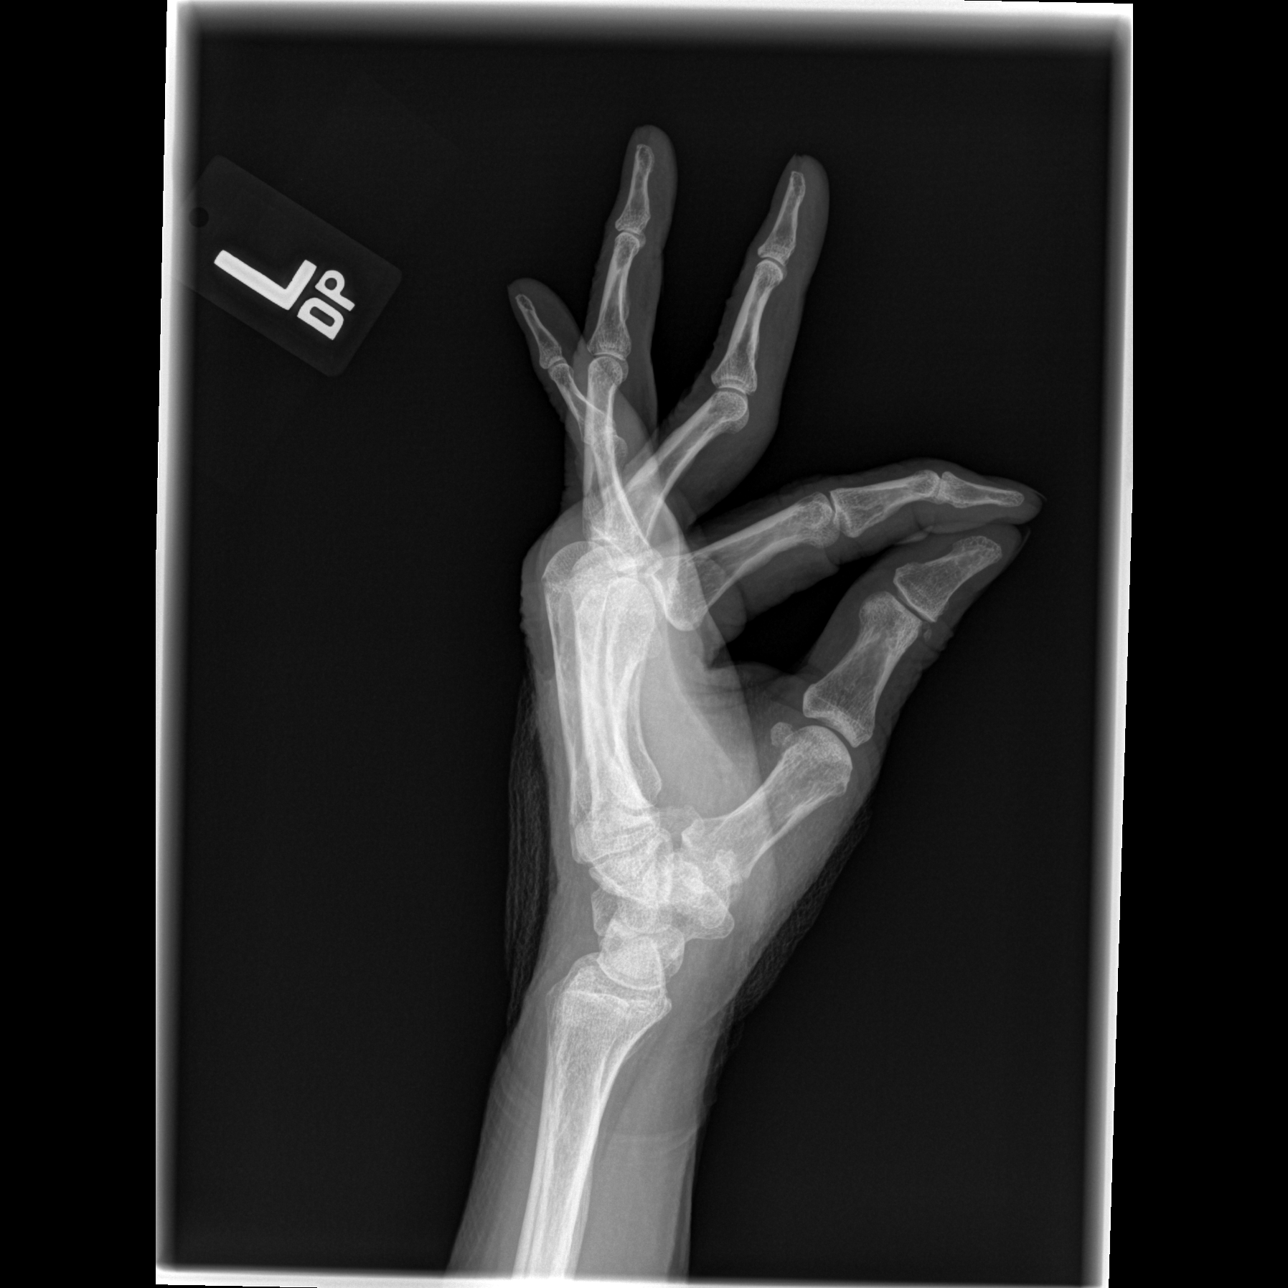

[3 of 3 positions shown; findings below may reference images not displayed]

FINDINGS: There is no evidence of fracture or dislocation. There is no
evidence of arthropathy or other focal bone abnormality. Mild soft
tissue swelling is noted.
IMPRESSION: No acute abnormality noted.

## 2021-08-01 DIAGNOSIS — I10 Essential (primary) hypertension: Secondary | ICD-10-CM | POA: Diagnosis not present

## 2021-08-01 DIAGNOSIS — R911 Solitary pulmonary nodule: Secondary | ICD-10-CM | POA: Diagnosis not present

## 2021-08-01 DIAGNOSIS — Z85118 Personal history of other malignant neoplasm of bronchus and lung: Secondary | ICD-10-CM | POA: Diagnosis not present

## 2021-08-01 DIAGNOSIS — Z87891 Personal history of nicotine dependence: Secondary | ICD-10-CM | POA: Diagnosis not present

## 2021-08-20 DIAGNOSIS — R8279 Other abnormal findings on microbiological examination of urine: Secondary | ICD-10-CM | POA: Diagnosis not present

## 2021-08-20 DIAGNOSIS — N200 Calculus of kidney: Secondary | ICD-10-CM | POA: Diagnosis not present

## 2021-09-25 DIAGNOSIS — I10 Essential (primary) hypertension: Secondary | ICD-10-CM | POA: Diagnosis not present

## 2021-09-25 DIAGNOSIS — Z87891 Personal history of nicotine dependence: Secondary | ICD-10-CM | POA: Diagnosis not present

## 2021-09-25 DIAGNOSIS — J449 Chronic obstructive pulmonary disease, unspecified: Secondary | ICD-10-CM | POA: Diagnosis not present

## 2021-09-25 DIAGNOSIS — Z9981 Dependence on supplemental oxygen: Secondary | ICD-10-CM | POA: Diagnosis not present

## 2021-09-25 DIAGNOSIS — Z7951 Long term (current) use of inhaled steroids: Secondary | ICD-10-CM | POA: Diagnosis not present

## 2021-10-08 DIAGNOSIS — I700 Atherosclerosis of aorta: Secondary | ICD-10-CM | POA: Diagnosis not present

## 2021-10-08 DIAGNOSIS — K219 Gastro-esophageal reflux disease without esophagitis: Secondary | ICD-10-CM | POA: Diagnosis not present

## 2021-10-08 DIAGNOSIS — J449 Chronic obstructive pulmonary disease, unspecified: Secondary | ICD-10-CM | POA: Diagnosis not present

## 2021-10-08 DIAGNOSIS — E7800 Pure hypercholesterolemia, unspecified: Secondary | ICD-10-CM | POA: Diagnosis not present

## 2021-10-08 DIAGNOSIS — Z87442 Personal history of urinary calculi: Secondary | ICD-10-CM | POA: Diagnosis not present

## 2021-10-08 DIAGNOSIS — I10 Essential (primary) hypertension: Secondary | ICD-10-CM | POA: Diagnosis not present

## 2021-10-08 DIAGNOSIS — Z1331 Encounter for screening for depression: Secondary | ICD-10-CM | POA: Diagnosis not present

## 2021-10-08 DIAGNOSIS — E559 Vitamin D deficiency, unspecified: Secondary | ICD-10-CM | POA: Diagnosis not present

## 2021-10-08 DIAGNOSIS — Z0000 Encounter for general adult medical examination without abnormal findings: Secondary | ICD-10-CM | POA: Diagnosis not present

## 2021-10-08 DIAGNOSIS — J9611 Chronic respiratory failure with hypoxia: Secondary | ICD-10-CM | POA: Diagnosis not present

## 2021-10-08 DIAGNOSIS — Z79899 Other long term (current) drug therapy: Secondary | ICD-10-CM | POA: Diagnosis not present

## 2021-10-08 DIAGNOSIS — C3490 Malignant neoplasm of unspecified part of unspecified bronchus or lung: Secondary | ICD-10-CM | POA: Diagnosis not present

## 2021-10-08 DIAGNOSIS — N390 Urinary tract infection, site not specified: Secondary | ICD-10-CM | POA: Diagnosis not present

## 2021-10-08 DIAGNOSIS — M5431 Sciatica, right side: Secondary | ICD-10-CM | POA: Diagnosis not present

## 2021-10-23 ENCOUNTER — Telehealth: Payer: Self-pay | Admitting: Pulmonary Disease

## 2021-10-24 MED ORDER — BREZTRI AEROSPHERE 160-9-4.8 MCG/ACT IN AERO: 2.0000 | INHALATION_SPRAY | Freq: Two times a day (BID) | RESPIRATORY_TRACT | 5 refills | Status: DC

## 2021-10-24 NOTE — Telephone Encounter (Signed)
Rx for [facility] sent to pharmacy for pt. Called and spoke with pt letting her know this had been done and she verbalized understanding. Also scheduled pt a follow up as she was due for an appt. Nothing further needed.

## 2021-10-30 ENCOUNTER — Encounter: Payer: Self-pay | Admitting: Primary Care

## 2021-10-30 ENCOUNTER — Ambulatory Visit: Payer: PPO | Admitting: Primary Care

## 2021-10-30 VITALS — BP 98/72 | HR 99 | Temp 98.0°F | Ht 64.0 in | Wt 171.2 lb

## 2021-10-30 DIAGNOSIS — R918 Other nonspecific abnormal finding of lung field: Secondary | ICD-10-CM | POA: Diagnosis not present

## 2021-10-30 DIAGNOSIS — J449 Chronic obstructive pulmonary disease, unspecified: Secondary | ICD-10-CM | POA: Diagnosis not present

## 2021-10-30 DIAGNOSIS — J9611 Chronic respiratory failure with hypoxia: Secondary | ICD-10-CM | POA: Diagnosis not present

## 2021-10-30 MED ORDER — BREZTRI AEROSPHERE 160-9-4.8 MCG/ACT IN AERO: 2.0000 | INHALATION_SPRAY | Freq: Two times a day (BID) | RESPIRATORY_TRACT | 0 refills | Status: DC

## 2021-10-30 NOTE — Progress Notes (Signed)
@Patient  ID: Brandy Morales, female    DOB: [birth year], 76 y.o.   MRN: [id number]  Chief Complaint  Patient presents with   Follow-up    Brandy Morales has been working well. Today breathing is difficult - believes related to weather.    Referring provider: [name], MD  HPI: 76 year old female, former smoker.  Past medical history significant for COPD, chronic respiratory failure with hypoxia, pulmonary nodules.  Patient of Dr. Valeta Harms, last seen in office on 04/10/2021.  Previous [facility] pulmonary encounters This is a 76 year old female past medical history of severe COPD, chronic oxygen dependent respiratory failure on 2 L pulse POC, asthma, hypertension.  Patient had CT scan imaging which revealed a right upper lobe pulmonary nodule abutting the major fissure.  Patient underwent nuclear medicine pet imaging which revealed a PET avid right upper lobe pulmonary nodule SUV 5.4.  Patient was referred for evaluation of navigational bronchoscopy and tissue sampling.  Patient felt to be not a good candidate for surgical resection due to poor lung function and functional status.  This was discussed with Dr. Chase Caller who is patient's primary pulmonologist.   OV 04/10/2021: I originally saw the patient in June 2021 for nodule consultation.  She was set up for bronchoscopy however declined due to concern for general anesthesia and ultimately sought radiation oncology in August 2021 and completed 54 Gray, 3 fractions of empiric SBRT to this highly suspicious nodule.  No tissue diagnosis was obtained.  Overall from a respiratory standpoint she is doing well.  She is using her portable oxygen.  She does feel like she may have had some decline in her functional status after hospitalization.  But we have not seen her in greater than a year.  She has been trying to stay at home is much as possible.    10/30/2021 She is doing well. Breathing improved since starting [employer]. She uses her albuterol rescue  inhaler on average once a week with improvement.  CAT score 25. She has of rare cough with phlegm production.  Occasional chest tightness.  She experiences significant dyspnea when walking up inclines or 1 flight of stairs. She is limited doing activities at home.  Shortness of breath does recover quickly with rest.  She wears 2 L of oxygen at rest and 3 L with exertion.  She does not currently wear oxygen at bedtime because she feels she does not need it.  CT chest in January 2023  showed no significant change in posttreatment appearance of spiculated nodule posterior right upper lobe measuring 0.8 x 0.7 cm. Additional small nodules in right upper lobe are unchanged and likely benign. Follow-up with Dr. Sondra Come in July.    Allergies  Allergen Reactions   Statins Cough   Latex Other (See Comments)    Broke out on face   Morphine And Related Other (See Comments)    Describes: "feel like I lose control of my body."   Oxycodone Other (See Comments)    Describes: "feels like I lose control of my body"    Immunization History  Administered Date(s) Administered   Influenza Split 03/03/2016, 03/11/2017   Influenza, High Dose Seasonal PF 02/23/2019   Influenza-Unspecified 01/27/2014, 04/02/2015, 02/18/2016, 03/02/2017, 02/11/2019, 03/21/2021   PFIZER Comirnaty(Gray Top)Covid-19 Tri-Sucrose Vaccine 11/01/2020   PFIZER(Purple Top)SARS-COV-2 Vaccination 06/14/2019, 07/05/2019   Pneumococcal Conjugate-13 01/27/2014, 05/26/2019   Pneumococcal Polysaccharide-23 07/02/2008    Past Medical History:  Diagnosis Date   Anxiety    Asthma    Cancer ([place])  COPD (chronic obstructive pulmonary disease) (HCC)    GERD (gastroesophageal reflux disease)    History of kidney stones    History of radiation therapy 01/05/2020   IMRT right lung 54 Gy in 3 Fractions 12/29/2019 through 01/05/2020  Dr Gery Pray   Hypertension     Tobacco History: Social History   Tobacco Use  Smoking Status Former    Packs/day: 2.00   Years: 55.00   Total pack years: 110.00   Types: Cigarettes   Quit date: 2016   Years since quitting: 7.4  Smokeless Tobacco Never   Counseling given: Not Answered   Outpatient Medications Prior to Visit  Medication Sig Dispense Refill   acyclovir (ZOVIRAX) 400 MG tablet Take 1 tablet (400 mg total) by mouth 3 (three) times daily. 12 tablet 0   albuterol (ACCUNEB) 1.25 MG/3ML nebulizer solution Take 1 ampule by nebulization every 6 (six) hours as needed for wheezing.     albuterol (VENTOLIN HFA) 108 (90 Base) MCG/ACT inhaler Inhale 2 puffs into the lungs every 6 (six) hours as needed for wheezing or shortness of breath.     Artificial Tear Solution (GENTEAL TEARS OP) Place 1 drop into both eyes daily.      atorvastatin (LIPITOR) 10 MG tablet Take 5 mg by mouth 3 (three) times a week. Take on MWF     Budeson-Glycopyrrol-Formoterol (BREZTRI AEROSPHERE) 160-9-4.8 MCG/ACT AERO Inhale 2 puffs into the lungs in the morning and at bedtime. 10.7 g 5   cetirizine (ZYRTEC) 10 MG tablet Take 10 mg by mouth daily.     Cholecalciferol (VITAMIN D3) 5000 units CAPS Take 5,000 Units by mouth daily.     citalopram (CELEXA) 20 MG tablet Take 20 mg by mouth daily.     ezetimibe (ZETIA) 10 MG tablet Take 10 mg by mouth at bedtime.     fluticasone (FLONASE) 50 MCG/ACT nasal spray Place 1 spray into both nostrils daily.      losartan-hydrochlorothiazide (HYZAAR) 100-25 MG tablet Take 0.5 tablets by mouth daily.     omeprazole (PRILOSEC) 20 MG capsule Take 20 mg by mouth daily.      OXYGEN Inhale 2 L into the lungs continuous.      No facility-administered medications prior to visit.    Review of Systems  Review of Systems  Constitutional: Negative.   HENT: Negative.    Respiratory:  Positive for cough and shortness of breath. Negative for chest tightness and wheezing.   Cardiovascular: Negative.     Physical Exam  BP 98/72 (BP Location: Left Arm, Patient Position: Sitting)    Pulse 99   Temp 98 F (36.7 C) (Oral)   Ht 5\' 4"  (1.626 m)   Wt 171 lb 3.2 oz (77.7 kg)   SpO2 97% Comment: 3 L  BMI 29.39 kg/m  Physical Exam Constitutional:      Appearance: Normal appearance.  HENT:     Head: Normocephalic and atraumatic.     Mouth/Throat:     Mouth: Mucous membranes are moist.     Pharynx: Oropharynx is clear.  Cardiovascular:     Rate and Rhythm: Normal rate and regular rhythm.  Pulmonary:     Effort: Pulmonary effort is normal.     Breath sounds: No wheezing, rhonchi or rales.     Comments: CTA; Wearing 2L POC  Skin:    General: Skin is warm and dry.  Neurological:     General: No focal deficit present.     Mental Status: She is  alert and oriented to person, place, and time. Mental status is at baseline.  Psychiatric:        Mood and Affect: Mood normal.        Behavior: Behavior normal.        Thought Content: Thought content normal.        Judgment: Judgment normal.      Lab Results:  CBC    Component Value Date/Time   WBC 8.5 01/02/2021 0503   RBC 4.40 01/02/2021 0503   RBC 4.32 01/02/2021 0503   HGB 12.3 01/02/2021 0503   HCT 39.5 01/02/2021 0503   PLT 145 (L) 01/02/2021 0503   MCV 91.4 01/02/2021 0503   MCH 28.5 01/02/2021 0503   MCHC 31.1 01/02/2021 0503   RDW 14.3 01/02/2021 0503   LYMPHSABS 0.5 (L) 12/29/2020 0657   MONOABS 0.8 12/29/2020 0657   EOSABS 0.0 12/29/2020 0657   BASOSABS 0.0 12/29/2020 0657    BMET    Component Value Date/Time   NA 147 (H) 01/02/2021 0503   K 4.3 01/02/2021 0503   CL 111 01/02/2021 0503   CO2 28 01/02/2021 0503   GLUCOSE 103 (H) 01/02/2021 0503   BUN 36 (H) 01/02/2021 0503   CREATININE 1.07 (H) 01/02/2021 0503   CALCIUM 9.3 01/02/2021 0503   GFRNONAA 55 (L) 01/02/2021 0503   GFRAA >60 11/25/2017 1051    BNP    Component Value Date/Time   BNP 185.2 (H) 01/02/2021 0503    ProBNP No results found for: "PROBNP"  Imaging: No results found.   Assessment & Plan:   Chronic  obstructive pulmonary disease ([place]) - Very severe COPD. Stable interval; Breathing improved on [employer]. She has no acute respiratory symptoms today and no recent exacerbations.  Uses SABA once weekly.  CAT score 25.  She has a rare cough with mucus production.  Dyspnea with exertion but recovers with rest.  No changes today.  We will refer to pulmonary rehab.  Continue Breztri 2 puffs every 12 hours and as needed albuterol 2 puffs every 4-6 hours for breakthrough symptoms.  Chronic respiratory failure with hypoxia (HCC) - Continue 2-3 liters supplemental oxygen to maintain O2 greater than 88 to 90%, encourage patient wear oxygen at bedtime.  If patient remains resistant to wearing oxygen at night would recommend checking an overnight oximetry test on room air to determine need.   Martyn Ehrich, NP 10/31/2021

## 2021-10-30 NOTE — Patient Instructions (Addendum)
Recommendations: - Continue Breztri 2 puffs every morning and evening - Use albuterol rescue inhaler or nebulizer every 4-6 hours as needed for breakthrough shortness of breath or wheezing - Wear 2L oxygen with exertion and at bedtime   Rx: - Two week sample Breztri   Referral: - Pulmonary rehab re: chronic respiratory failure and severe COPD  Follow-up - 6 months with Dr. Valeta Harms or sooner if needed

## 2021-10-31 NOTE — Assessment & Plan Note (Addendum)
-    S/p SBRT for an empiric right lung nodule.  CT chest in January 2023  showed no significant change in posttreatment appearance of spiculated nodule posterior right upper lobe measuring 0.8 x 0.7 cm. Additional small nodules in right upper lobe are unchanged and likely benign. Follow-up with Dr. Sondra Come in July

## 2021-10-31 NOTE — Assessment & Plan Note (Signed)
-   Continue 2-3 liters supplemental oxygen to maintain O2 greater than 88 to 90%, encourage patient wear oxygen at bedtime.  If patient remains resistant to wearing oxygen at night would recommend checking an overnight oximetry test on room air to determine need.

## 2021-10-31 NOTE — Assessment & Plan Note (Addendum)
-   Very severe COPD. Stable interval; Breathing improved on [employer]. She has no acute respiratory symptoms today and no recent exacerbations.  Uses SABA once weekly.  CAT score 25.  She has a rare cough with mucus production.  Dyspnea with exertion but recovers with rest.  No changes today.  We will refer to pulmonary rehab.  Continue Breztri 2 puffs every 12 hours and as needed albuterol 2 puffs every 4-6 hours for breakthrough symptoms.

## 2021-11-06 ENCOUNTER — Telehealth (HOSPITAL_COMMUNITY): Payer: Self-pay

## 2021-11-06 NOTE — Telephone Encounter (Signed)
Pt insurance is active and benefits verified through [employer] adv Co-pay $15, DED 0/0 met, out of pocket $3,200/$179.08 met, co-insurance 0%. YEA pre-authorization required for [id number]. [place]/[employer] 11/06/2021_0 :55am, REF# [id number]   How many CR sessions are covered? (36 sessions for TCR, 72 sessions for ICR)no limit Is this a lifetime maximum or an annual maximum? N/a Has the member used any of these services to date? no Is there a time limit (weeks/months) on start of program and/or program completion? no

## 2021-11-20 DIAGNOSIS — Z9981 Dependence on supplemental oxygen: Secondary | ICD-10-CM | POA: Diagnosis not present

## 2021-11-20 DIAGNOSIS — Z7951 Long term (current) use of inhaled steroids: Secondary | ICD-10-CM | POA: Diagnosis not present

## 2021-11-20 DIAGNOSIS — Z87891 Personal history of nicotine dependence: Secondary | ICD-10-CM | POA: Diagnosis not present

## 2021-11-20 DIAGNOSIS — J449 Chronic obstructive pulmonary disease, unspecified: Secondary | ICD-10-CM | POA: Diagnosis not present

## 2021-11-21 ENCOUNTER — Telehealth: Payer: Self-pay | Admitting: *Deleted

## 2021-11-21 NOTE — Telephone Encounter (Signed)
CALLED PATIENT TO INFORM OF CT FOR 12-06-21- ARRIVAL TIME- 2 PM @ [facility], NO RESTRICTIONS TO TEST, PATIENT TO RECEIVE CT RESULTS FROM DR. KINARD ON 12-12-21 @ 10 AM, SPOKE WITH PATIENT AND SHE IS AWARE OF THESE APPTS.

## 2021-11-22 ENCOUNTER — Telehealth: Payer: Self-pay | Admitting: Oncology

## 2021-11-22 NOTE — Telephone Encounter (Signed)
Brandy Morales called and was wondering why she needs a CT and follow up with Dr. Sondra Come in July.  She thought she needed to follow up in January.  Discussed that Dr. Clabe Seal note from January says to follow up in 6 months.  She would like to double check with Dr. Sondra Come because it seems too soon.

## 2021-11-25 NOTE — Telephone Encounter (Signed)
Called Natalyn back and advised her that Dr. Sondra Come does want her to follow up in 6 months. She verbalized understanding and reviewed upcoming appointments.

## 2021-12-06 ENCOUNTER — Ambulatory Visit (HOSPITAL_COMMUNITY)
Admission: RE | Admit: 2021-12-06 | Discharge: 2021-12-06 | Disposition: A | Discharge status: Home / Self Care | Payer: PPO | Source: Ambulatory Visit | Attending: Radiation Oncology | Admitting: Radiation Oncology

## 2021-12-06 DIAGNOSIS — J439 Emphysema, unspecified: Secondary | ICD-10-CM | POA: Diagnosis not present

## 2021-12-06 DIAGNOSIS — R918 Other nonspecific abnormal finding of lung field: Secondary | ICD-10-CM | POA: Diagnosis not present

## 2021-12-06 DIAGNOSIS — C3490 Malignant neoplasm of unspecified part of unspecified bronchus or lung: Secondary | ICD-10-CM | POA: Diagnosis not present

## 2021-12-11 NOTE — Progress Notes (Signed)
Radiation Oncology         [phone] ________________________________  Name: Brandy Morales MRN: [id number]  Date: 12/12/2021  DOB: Mar 10, 1946  Follow-Up Visit Note  CC: [name], MD  Brand Males, MD    ICD-10-CM   1. Pulmonary nodules  R91.8 CT CHEST WO CONTRAST      Diagnosis: PET avid right upper lobe spiculated pulmonary nodule concerning for primary bronchogenic carcinoma    Interval Since Last Radiation: 1 year, 11 months, and 29 days   Radiation Treatment Dates: 12/29/2019 through 01/05/2020 Site Technique Total Dose (Gy) Dose per Fx (Gy) Completed Fx Beam Energies  Lung, Right: Lung_Rt SBRT 54/54 18 3/3 6XFFF    Narrative:  The patient returns today for routine follow-up and to review recent imaging, she was last seen here for follow up on 06/10/21. Since her last visit, the patient followed up with Pulmonology Geraldo Pitter NP) on 10/30/21 in regards to her COPD. During which time, the patient's breathing was noted to have improved since starting [employer]. The patient also reported using her albuterol rescue inhaler on average once a week with improvement. In terms of symptoms, the patient reported instances of cough with phlegm production, occasional chest tightness, and significant dyspnea when walking up inclines or stairs, which improves quickly at rest. The patient was also noted to use 2 L O2 at rest and 3 L with exertion. (All of her above symptoms are not new). No changes were made to her treatment regimen, though the patient was referred to pulmonary rehab for further management.       Her most recent chest CT on 12/06/21 showed no significant change in post treatment appearance of the posterior right upper lobe nodule measuring 0.7 x 0.7 cm  (with some minimal adjacent ground-glass and bandlike scarring). CT also showed the additional small RUL nodules as unchanged in the interval, but an increase in conspicuity of adjacent, ill-defined  ground-glass nodules of the peripheral LUL measuring approximately 1.1 x 1.0 cm and 1.1 x 1.1 cm respectively.             On evaluation today the patient denies any pain within the chest area.  He denies any significant coughing or hemoptysis.  Her breathing is stable on 2 L.  She uses 3 L with exertion.  Allergies:  is allergic to statins, latex, morphine and related, and oxycodone.  Meds: Current Outpatient Medications  Medication Sig Dispense Refill   acyclovir (ZOVIRAX) 400 MG tablet Take 1 tablet (400 mg total) by mouth 3 (three) times daily. 12 tablet 0   albuterol (ACCUNEB) 1.25 MG/3ML nebulizer solution Take 1 ampule by nebulization every 6 (six) hours as needed for wheezing.     albuterol (VENTOLIN HFA) 108 (90 Base) MCG/ACT inhaler Inhale 2 puffs into the lungs every 6 (six) hours as needed for wheezing or shortness of breath.     Artificial Tear Solution (GENTEAL TEARS OP) Place 1 drop into both eyes daily.      atorvastatin (LIPITOR) 10 MG tablet Take 5 mg by mouth 3 (three) times a week. Take on MWF     Budeson-Glycopyrrol-Formoterol (BREZTRI AEROSPHERE) 160-9-4.8 MCG/ACT AERO Inhale 2 puffs into the lungs in the morning and at bedtime. 10.7 g 5   Budeson-Glycopyrrol-Formoterol (BREZTRI AEROSPHERE) 160-9-4.8 MCG/ACT AERO Inhale 2 puffs into the lungs in the morning and at bedtime. 1 each 0   cetirizine (ZYRTEC) 10 MG tablet Take 10 mg by mouth daily.     Cholecalciferol (VITAMIN  D3) 5000 units CAPS Take 5,000 Units by mouth daily.     citalopram (CELEXA) 20 MG tablet Take 20 mg by mouth daily.     ezetimibe (ZETIA) 10 MG tablet Take 10 mg by mouth at bedtime.     fluticasone (FLONASE) 50 MCG/ACT nasal spray Place 1 spray into both nostrils daily.      losartan-hydrochlorothiazide (HYZAAR) 100-25 MG tablet Take 0.5 tablets by mouth daily.     omeprazole (PRILOSEC) 20 MG capsule Take 20 mg by mouth daily.      OXYGEN Inhale 2 L into the lungs continuous.      No current  facility-administered medications for this encounter.    Physical Findings: The patient is in no acute distress. Patient is alert and oriented.  height is 5\' 4"  (1.626 m) and weight is 171 lb 6.4 oz (77.7 kg). Her temperature is 97.9 F (36.6 C). Her blood pressure is 110/79 and her pulse is 96. Her respiration is 18 and oxygen saturation is 95%. .  No significant changes. Lungs are clear to auscultation bilaterally. Heart has regular rate and rhythm. No palpable cervical, supraclavicular, or axillary adenopathy. Abdomen soft, non-tender, normal bowel sounds.    Lab Findings: Lab Results  Component Value Date   WBC 8.5 01/02/2021   HGB 12.3 01/02/2021   HCT 39.5 01/02/2021   MCV 91.4 01/02/2021   PLT 145 (L) 01/02/2021    Radiographic Findings: CT CHEST WO CONTRAST  Result Date: 12/09/2021 CLINICAL DATA:  Non-small cell lung cancer, assess treatment response, right upper lobe status post XRT, former smoker * Tracking Code: BO * EXAM: CT CHEST WITHOUT CONTRAST TECHNIQUE: Multidetector CT imaging of the chest was performed following the standard protocol without IV contrast. RADIATION DOSE REDUCTION: This exam was performed according to the departmental dose-optimization program which includes automated exposure control, adjustment of the mA and/or kV according to patient size and/or use of iterative reconstruction technique. COMPARISON:  06/07/2021 FINDINGS: Cardiovascular: Aortic atherosclerosis. Normal heart size. Three-vessel coronary artery calcifications. No pericardial effusion. Mediastinum/Nodes: No enlarged mediastinal, hilar, or axillary lymph nodes. Thyroid gland, trachea, and esophagus demonstrate no significant findings. 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 No significant change in post treatment appearance of a nodule of the posterior right upper lobe measuring 0.7 x 0.7 cm with some minimal adjacent ground-glass and bandlike scarring (series 5, image 69).  Unchanged irregular subpleural nodule of the anterior right upper lobe with some adjacent ground-glass measuring 0.5 cm (series 5, image 41). Unchanged 0.3 cm nodule of the right apex (series 5, image 35). Increased conspicuity of adjacent, ill-defined ground-glass nodules of the peripheral left upper lobe measuring approximately 1.1 x 1.0 cm (series 5, image 64) and 1.1 x 1.1 cm (series 5, image 66). No pleural effusion or pneumothorax. Upper Abdomen: No acute abnormality. Unchanged low-attenuation liver lesions consistent with cysts or hemangiomata. Stable, benign fat containing left adrenal adenoma. Musculoskeletal: No chest wall abnormality. No suspicious osseous lesions identified. IMPRESSION: 1. No significant change in post treatment appearance of a nodule of the posterior right upper lobe measuring 0.7 x 0.7 cm with some minimal adjacent ground-glass and bandlike scarring. 2. Additional unchanged small nodules of the right upper lobe. Attention on follow-up. 3. Increased conspicuity of adjacent, ill-defined ground-glass nodules of the peripheral left upper lobe measuring approximately 1.1 x 1.0 cm and 1.1 x 1.1 cm. These are nonspecific and possibly infectious or inflammatory although multifocal indolent adenocarcinoma is not excluded. Attention on follow-up. 4. Emphysema. 5. Coronary artery  disease. Aortic Atherosclerosis (ICD10-I70.0) and Emphysema (ICD10-J43.9). Electronically Signed   By: Delanna Ahmadi M.D.   On: 12/09/2021 08:47    Impression:  PET avid right upper lobe spiculated pulmonary nodule concerning for primary bronchogenic carcinoma    No evidence of recurrence on clinical exam today.  Recent chest CT scan favorable.   Plan: Routine follow-up in 6 months.  Prior to this follow-up appointment the patient will have a repeat CT scan of the chest.   20 minutes of total time was spent for this patient encounter, including preparation, face-to-face counseling with the patient and  coordination of care, physical exam, and documentation of the encounter. ____________________________________  Blair Promise, PhD, MD  This document serves as a record of services personally performed by Gery Pray, MD. It was created on his behalf by Roney Mans, a trained medical scribe. The creation of this record is based on the scribe's personal observations and the provider's statements to them. This document has been checked and approved by the attending provider.

## 2021-12-12 ENCOUNTER — Ambulatory Visit
Admission: RE | Admit: 2021-12-12 | Discharge: 2021-12-12 | Disposition: A | Discharge status: Home / Self Care | Payer: PPO | Source: Ambulatory Visit | Attending: Radiation Oncology | Admitting: Radiation Oncology

## 2021-12-12 ENCOUNTER — Other Ambulatory Visit: Payer: Self-pay

## 2021-12-12 ENCOUNTER — Encounter: Payer: Self-pay | Admitting: Radiation Oncology

## 2021-12-12 DIAGNOSIS — D3502 Benign neoplasm of left adrenal gland: Secondary | ICD-10-CM | POA: Insufficient documentation

## 2021-12-12 DIAGNOSIS — I700 Atherosclerosis of aorta: Secondary | ICD-10-CM | POA: Diagnosis not present

## 2021-12-12 DIAGNOSIS — Z79899 Other long term (current) drug therapy: Secondary | ICD-10-CM | POA: Insufficient documentation

## 2021-12-12 DIAGNOSIS — Z923 Personal history of irradiation: Secondary | ICD-10-CM | POA: Diagnosis not present

## 2021-12-12 DIAGNOSIS — R911 Solitary pulmonary nodule: Secondary | ICD-10-CM | POA: Diagnosis not present

## 2021-12-12 DIAGNOSIS — I2510 Atherosclerotic heart disease of native coronary artery without angina pectoris: Secondary | ICD-10-CM | POA: Insufficient documentation

## 2021-12-12 DIAGNOSIS — J432 Centrilobular emphysema: Secondary | ICD-10-CM | POA: Insufficient documentation

## 2021-12-12 DIAGNOSIS — R918 Other nonspecific abnormal finding of lung field: Secondary | ICD-10-CM | POA: Diagnosis not present

## 2021-12-12 NOTE — Progress Notes (Signed)
Brandy Morales is here today for follow up post radiation to the lung.  Lung Side: Right,patient completed treatment on 01/05/20  Does the patient complain of any of the following: Pain:No Shortness of breath w/wo exertion: Yes, patient continues to use oxygen @ 2 liters via [place].  Cough: Yes, productive, mostly in the morning.  Hemoptysis: No Pain with swallowing: No Swallowing/choking concerns: No Appetite: Good Energy Level: Continues to have low energy level.  Post radiation skin Changes: No    Additional comments if applicable:   BP 939/68   Pulse 96   Temp 97.9 F (36.6 C)   Resp 18   Ht 5\' 4"  (1.626 m)   Wt 171 lb 6.4 oz (77.7 kg)   SpO2 95% Comment: on 2 liter of oxygen  BMI 29.42 kg/m

## 2021-12-24 DIAGNOSIS — J449 Chronic obstructive pulmonary disease, unspecified: Secondary | ICD-10-CM | POA: Diagnosis not present

## 2021-12-24 DIAGNOSIS — E7800 Pure hypercholesterolemia, unspecified: Secondary | ICD-10-CM | POA: Diagnosis not present

## 2021-12-24 DIAGNOSIS — I10 Essential (primary) hypertension: Secondary | ICD-10-CM | POA: Diagnosis not present

## 2021-12-24 DIAGNOSIS — K219 Gastro-esophageal reflux disease without esophagitis: Secondary | ICD-10-CM | POA: Diagnosis not present

## 2021-12-26 ENCOUNTER — Telehealth (HOSPITAL_COMMUNITY): Payer: Self-pay | Admitting: *Deleted

## 2021-12-26 NOTE — Telephone Encounter (Signed)
Called to confirm appt. Pt confirmed appt. Instructed pt on proper footwear and COVID screening. Gave directions along with department number.  Yves Dill BS, ACSM-CEP 12/26/2021 9:57 AM

## 2021-12-27 ENCOUNTER — Encounter (HOSPITAL_COMMUNITY): Payer: Self-pay

## 2021-12-27 ENCOUNTER — Encounter (HOSPITAL_COMMUNITY)
Admission: RE | Admit: 2021-12-27 | Discharge: 2021-12-27 | Disposition: A | Discharge status: Home / Self Care | Payer: PPO | Source: Ambulatory Visit | Attending: Pulmonary Disease | Admitting: Pulmonary Disease

## 2021-12-27 VITALS — BP 91/60 | HR 107 | Ht 64.0 in | Wt 171.1 lb

## 2021-12-27 DIAGNOSIS — J9611 Chronic respiratory failure with hypoxia: Secondary | ICD-10-CM | POA: Diagnosis not present

## 2021-12-27 NOTE — Progress Notes (Signed)
Pulmonary Rehab Orientation Physical Assessment Note  Physical assessment reveals  Pt  who is familiar to the pulmonary rehab staff from previous participation in 2022.,is alert and oriented x 3.  Heart rate is normal, breath sounds clear to auscultation, no wheezes, rales, or rhonchi, but diminished. Throughout.  Reports non-productive cough mostly with first thing in the morning phlegm. Bowel sounds present/not present.  Pt denies abdominal discomfort, nausea, vomiting or diarrhea. Does have issues with constipation Grip strength equal, strong. Distal pulses palpable; no swelling to lower extremities. Pt with initial low bp with some complaints of dizziness when she stands up.  Pt ate lunch and had couple cups of coffee.  Pt with low skin turgor.  Pt given two bottles of  water.  Much improved bp and no complaints.  Able to proceed with walk test with not complains.  Advised pt to increase water intake. Cherre Huger, BSN Cardiac and Training and development officer

## 2021-12-27 NOTE — Progress Notes (Signed)
Wende [place]streth 76 y.o. female Pulmonary Rehab Orientation Note This patient who was referred to Pulmonary Rehab by Dr. Valeta Harms with the diagnosis of Chronic Respiratory Failure with Hypoxia arrived today in Cardiac and Pulmonary Rehab. She  arrived ambulatory with normal gait. She  does carry portable oxygen. [employer] is the provider for their DME. Per patient, [place] uses oxygen continuously. Color good, skin warm and dry. Patient is oriented to time and place. Patient's medical history, psychosocial health, and medications reviewed. Psychosocial assessment reveals patient lives with alone. Nellie is currently retired. Patient hobbies include  working jigsaw puzzles . Patient reports her stress level is moderate. Areas of stress/anxiety include  the news . Patient does not exhibit signs of depression. . PHQ2/9 score 0/2. Tilda shows fair  coping skills with positive outlook on life. Offered emotional support and reassurance. Will continue to monitor and evaluate progress toward psychosocial goal(s). Physical assessment performed by Maurice Small RN. Please see their orientation physical assessment note. Latreshia reports she does not take medications as prescribed. Patient states she follows a regular  diet. The patient reports no specific efforts to gain or lose weight.. Patient's weight will be monitored closely. Demonstration and practice of PLB using pulse oximeter. Chaunte able to return demonstration satisfactorily. Safety and hand hygiene in the exercise area reviewed with patient. Kesleigh voices understanding of the information reviewed. Department expectations discussed with patient and achievable goals were set. The patient shows enthusiasm about attending the program and we look forward to working with Inez Catalina. Clelia completed a 6 min walk test today and is scheduled to begin exercise on 12/31/21 at 1:15.   1325--1400 Rayvn Rickerson Yves Dill, BS, ACSM-CEP

## 2021-12-27 NOTE — Progress Notes (Signed)
Pulmonary Individual Treatment Plan  Patient Details  Name: Brandy Morales MRN: [id number] Date of Birth: 11/28/1945 Referring Provider:   [name] Pulmonary Rehab Walk Test from 12/27/2021 in [place]  Referring Provider [name]       Initial Encounter Date:  Flowsheet Row Pulmonary Rehab Walk Test from 12/27/2021 in [place]  Date 12/27/21       Visit Diagnosis: Chronic respiratory failure with hypoxia ([place])  Patient's Home Medications on Admission:   Current Outpatient Medications:    albuterol (VENTOLIN HFA) 108 (90 Base) MCG/ACT inhaler, Inhale 2 puffs into the lungs every 6 (six) hours as needed for wheezing or shortness of breath., Disp: , Rfl:    Artificial Tear Solution (GENTEAL TEARS OP), Place 1 drop into both eyes daily. , Disp: , Rfl:    atorvastatin (LIPITOR) 10 MG tablet, Take 5 mg by mouth 3 (three) times a week. Take on MWF, Disp: , Rfl:    Budeson-Glycopyrrol-Formoterol (BREZTRI AEROSPHERE) 160-9-4.8 MCG/ACT AERO, Inhale 2 puffs into the lungs in the morning and at bedtime., Disp: 10.7 g, Rfl: 5   cetirizine (ZYRTEC) 10 MG tablet, Take 10 mg by mouth daily., Disp: , Rfl:    Cholecalciferol (VITAMIN D3) 5000 units CAPS, Take 5,000 Units by mouth daily., Disp: , Rfl:    citalopram (CELEXA) 20 MG tablet, Take 20 mg by mouth daily., Disp: , Rfl:    ezetimibe (ZETIA) 10 MG tablet, Take 10 mg by mouth at bedtime., Disp: , Rfl:    fluticasone (FLONASE) 50 MCG/ACT nasal spray, Place 1 spray into both nostrils daily. , Disp: , Rfl:    losartan-hydrochlorothiazide (HYZAAR) 100-25 MG tablet, Take 0.5 tablets by mouth daily., Disp: , Rfl:    Multiple Vitamin (MULTIVITAMIN) tablet, Take 1 tablet by mouth daily., Disp: , Rfl:    omeprazole (PRILOSEC) 20 MG capsule, Take 20 mg by mouth daily. , Disp: , Rfl:    OXYGEN, Inhale 2 L into the lungs continuous. , Disp: , Rfl:    acyclovir (ZOVIRAX) 400 MG tablet,  Take 1 tablet (400 mg total) by mouth 3 (three) times daily. (Patient not taking: Reported on 12/27/2021), Disp: 12 tablet, Rfl: 0   albuterol (ACCUNEB) 1.25 MG/3ML nebulizer solution, Take 1 ampule by nebulization every 6 (six) hours as needed for wheezing. (Patient not taking: Reported on 12/27/2021), Disp: , Rfl:    Budeson-Glycopyrrol-Formoterol (BREZTRI AEROSPHERE) 160-9-4.8 MCG/ACT AERO, Inhale 2 puffs into the lungs in the morning and at bedtime. (Patient not taking: Reported on 12/27/2021), Disp: 1 each, Rfl: 0  Past Medical History: Past Medical History:  Diagnosis Date   Anxiety    Asthma    Cancer ([place])    COPD (chronic obstructive pulmonary disease) (HCC)    GERD (gastroesophageal reflux disease)    History of kidney stones    History of radiation therapy 01/05/2020   IMRT right lung 54 Gy in 3 Fractions 12/29/2019 through 01/05/2020  Dr Gery Pray   Hypertension     Tobacco Use: Social History   Tobacco Use  Smoking Status Former   Packs/day: 2.00   Years: 55.00   Total pack years: 110.00   Types: Cigarettes   Quit date: 2016   Years since quitting: 7.5  Smokeless Tobacco Never    Labs: Review Flowsheet       Latest Ref Rng & Units 12/28/2020  Labs for ITP Cardiac and Pulmonary Rehab  TCO2 22 - 32 mmol/L 26  Capillary Blood Glucose: Lab Results  Component Value Date   GLUCAP 81 10/12/2019   GLUCAP 91 05/11/2017     Pulmonary Assessment Scores:  Pulmonary Assessment Scores     Row Name 12/27/21 1448         ADL UCSD   ADL Phase Entry     SOB Score total 102       CAT Score   CAT Score 30       mMRC Score   mMRC Score 4             UCSD: Self-administered rating of dyspnea associated with activities of daily living (ADLs) 6-point scale (0 = "not at all" to 5 = "maximal or unable to do because of breathlessness")  Scoring Scores range from 0 to 120.  Minimally important difference is 5 units  CAT: CAT can identify the health impairment  of COPD patients and is better correlated with disease progression.  CAT has a scoring range of zero to 40. The CAT score is classified into four groups of low (less than 10), medium (10 - 20), high (21-30) and very high (31-40) based on the impact level of disease on health status. A CAT score over 10 suggests significant symptoms.  A worsening CAT score could be explained by an exacerbation, poor medication adherence, poor inhaler technique, or progression of COPD or comorbid conditions.  CAT MCID is 2 points  mMRC: mMRC (Modified Medical Research Council) Dyspnea Scale is used to assess the degree of baseline functional disability in patients of respiratory disease due to dyspnea. No minimal important difference is established. A decrease in score of 1 point or greater is considered a positive change.   Pulmonary Function Assessment:  Pulmonary Function Assessment - 12/27/21 1445       Breath   Bilateral Breath Sounds Decreased    Shortness of Breath Yes;Fear of Shortness of Breath;Limiting activity             Exercise Target Goals: Exercise Program Goal: Individual exercise prescription set using results from initial 6 min walk test and THRR while considering  patient's activity barriers and safety.   Exercise Prescription Goal: Initial exercise prescription builds to 30-45 minutes a day of aerobic activity, 2-3 days per week.  Home exercise guidelines will be given to patient during program as part of exercise prescription that the participant will acknowledge.  Activity Barriers & Risk Stratification:  Activity Barriers & Cardiac Risk Stratification - 12/27/21 1438       Activity Barriers & Cardiac Risk Stratification   Activity Barriers Deconditioning;Muscular Weakness;Shortness of Breath;History of Falls;Balance Concerns    Cardiac Risk Stratification Low             6 Minute Walk:  6 Minute Walk     Row Name 12/27/21 1543         6 Minute Walk   Phase  Initial     Distance 790 feet     Walk Time 6 minutes     # of Rest Breaks 2     MPH 1.5     METS 1.75     RPE 13     Perceived Dyspnea  3     VO2 Peak 6.12     Symptoms Yes (comment)     Comments SOB and fatigue     Resting HR 107 bpm     Resting BP 105/70     Resting Oxygen Saturation  93 %  Exercise Oxygen Saturation  during 6 min walk 88 %     Max Ex. HR 117 bpm     Max Ex. BP 131/84     2 Minute Post BP 112/81       Interval HR   1 Minute HR 107     2 Minute HR 113     3 Minute HR 115     4 Minute HR 111     5 Minute HR 117     6 Minute HR 116     2 Minute Post HR 100     Interval Heart Rate? Yes       Interval Oxygen   Interval Oxygen? Yes     Baseline Oxygen Saturation % 93 %     1 Minute Oxygen Saturation % 93 %     1 Minute Liters of Oxygen 3 L     2 Minute Oxygen Saturation % 89 %     2 Minute Liters of Oxygen 3 L     3 Minute Oxygen Saturation % 89 %     3 Minute Liters of Oxygen 3 L     4 Minute Oxygen Saturation % 89 %     4 Minute Liters of Oxygen 3 L     5 Minute Oxygen Saturation % 88 %     5 Minute Liters of Oxygen 3 L     6 Minute Oxygen Saturation % 89 %     6 Minute Liters of Oxygen 3 L     2 Minute Post Oxygen Saturation % 96 %     2 Minute Post Liters of Oxygen 3 L              Oxygen Initial Assessment:  Oxygen Initial Assessment - 12/27/21 1436       Home Oxygen   Home Oxygen Device Home Concentrator    Sleep Oxygen Prescription Continuous    Liters per minute 2    Home Exercise Oxygen Prescription Pulsed    Liters per minute 3    Home Resting Oxygen Prescription Continuous    Liters per minute 2    Compliance with Home Oxygen Use Yes      Initial 6 min Walk   Oxygen Used Continuous    Liters per minute 3      Program Oxygen Prescription   Program Oxygen Prescription Continuous    Liters per minute 3    Comments used 3L during 6 MWT with 2 breaks for fatigue/SOB and SaO2 88      Intervention   Short Term Goals To  learn and exhibit compliance with exercise, home and travel O2 prescription;To learn and understand importance of monitoring SPO2 with pulse oximeter and demonstrate accurate use of the pulse oximeter.;To learn and understand importance of maintaining oxygen saturations>88%;To learn and demonstrate proper pursed lip breathing techniques or other breathing techniques. ;To learn and demonstrate proper use of respiratory medications    Long  Term Goals Exhibits compliance with exercise, home  and travel O2 prescription;Verbalizes importance of monitoring SPO2 with pulse oximeter and return demonstration;Maintenance of O2 saturations>88%;Exhibits proper breathing techniques, such as pursed lip breathing or other method taught during program session;Compliance with respiratory medication;Demonstrates proper use of MDI's             Oxygen Re-Evaluation:   Oxygen Discharge (Final Oxygen Re-Evaluation):   Initial Exercise Prescription:  Initial Exercise Prescription - 12/27/21 1500       Date of  Initial Exercise RX and Referring Provider   Date 12/27/21    Referring Provider [name]    Expected Discharge Date 03/06/22      Oxygen   Oxygen Continuous    Liters 3    Maintain Oxygen Saturation 88% or higher      Recumbant Bike   Level 1    RPM 70    Watts 1    Minutes 15      Track   Minutes 15    METs 1.75      Prescription Details   Frequency (times per week) 2    Duration Progress to 30 minutes of continuous aerobic without signs/symptoms of physical distress      Intensity   THRR 40-80% of Max Heartrate 58-116    Ratings of Perceived Exertion 11-13    Perceived Dyspnea 0-4      Progression   Progression Continue to progress workloads to maintain intensity without signs/symptoms of physical distress.      Resistance Training   Training Prescription Yes    Weight red bands    Reps 10-15             Perform Capillary Blood Glucose checks as needed.  Exercise  Prescription Changes:   Exercise Comments:   Exercise Goals and Review:   Exercise Goals     Row Name 12/27/21 1548             Exercise Goals   Increase Physical Activity Yes       Intervention Provide advice, education, support and counseling about physical activity/exercise needs.;Develop an individualized exercise prescription for aerobic and resistive training based on initial evaluation findings, risk stratification, comorbidities and participant's personal goals.       Expected Outcomes Short Term: Attend rehab on a regular basis to increase amount of physical activity.;Long Term: Add in home exercise to make exercise part of routine and to increase amount of physical activity.;Long Term: Exercising regularly at least 3-5 days a week.       Increase Strength and Stamina Yes       Intervention Provide advice, education, support and counseling about physical activity/exercise needs.;Develop an individualized exercise prescription for aerobic and resistive training based on initial evaluation findings, risk stratification, comorbidities and participant's personal goals.       Expected Outcomes Short Term: Increase workloads from initial exercise prescription for resistance, speed, and METs.;Short Term: Perform resistance training exercises routinely during rehab and add in resistance training at home;Long Term: Improve cardiorespiratory fitness, muscular endurance and strength as measured by increased METs and functional capacity (6MWT)       Able to understand and use rate of perceived exertion (RPE) scale Yes       Intervention Provide education and explanation on how to use RPE scale       Expected Outcomes Short Term: Able to use RPE daily in rehab to express subjective intensity level;Long Term:  Able to use RPE to guide intensity level when exercising independently       Able to understand and use Dyspnea scale Yes       Intervention Provide education and explanation on how to  use Dyspnea scale       Expected Outcomes Short Term: Able to use Dyspnea scale daily in rehab to express subjective sense of shortness of breath during exertion;Long Term: Able to use Dyspnea scale to guide intensity level when exercising independently       Knowledge and understanding of Target Heart  Rate Range (THRR) Yes       Intervention Provide education and explanation of THRR including how the numbers were predicted and where they are located for reference       Expected Outcomes Short Term: Able to state/look up THRR;Long Term: Able to use THRR to govern intensity when exercising independently;Short Term: Able to use daily as guideline for intensity in rehab       Understanding of Exercise Prescription Yes       Intervention Provide education, explanation, and written materials on patient's individual exercise prescription       Expected Outcomes Short Term: Able to explain program exercise prescription;Long Term: Able to explain home exercise prescription to exercise independently                Exercise Goals Re-Evaluation :   Discharge Exercise Prescription (Final Exercise Prescription Changes):   Nutrition:  Target Goals: Understanding of nutrition guidelines, daily intake of sodium '1500mg'$ , cholesterol '200mg'$ , calories 30% from fat and 7% or less from saturated fats, daily to have 5 or more servings of fruits and vegetables.  Biometrics:  Pre Biometrics - 12/27/21 1548       Pre Biometrics   Grip Strength 20 kg              Nutrition Therapy Plan and Nutrition Goals:   Nutrition Assessments:  MEDIFICTS Score Key: ?70 Need to make dietary changes  40-70 Heart Healthy Diet ? 40 Therapeutic Level Cholesterol Diet  Flowsheet Row PULMONARY REHAB OTHER RESPIRATORY from 11/29/2020 in [place]  Picture Your Plate Total Score on Discharge 60      Picture Your Plate Scores: <99 Unhealthy dietary pattern with much room for  improvement. 41-50 Dietary pattern unlikely to meet recommendations for good health and room for improvement. 51-60 More healthful dietary pattern, with some room for improvement.  >60 Healthy dietary pattern, although there may be some specific behaviors that could be improved.    Nutrition Goals Re-Evaluation:   Nutrition Goals Discharge (Final Nutrition Goals Re-Evaluation):   Psychosocial: Target Goals: Acknowledge presence or absence of significant depression and/or stress, maximize coping skills, provide positive support system. Participant is able to verbalize types and ability to use techniques and skills needed for reducing stress and depression.  Initial Review & Psychosocial Screening:  Initial Psych Review & Screening - 12/27/21 1428       Initial Review   Current issues with None Identified      Family Dynamics   Good Support System? Yes    Comments step daughter Erasmo Downer and her husband, friends      Barriers   Psychosocial barriers to participate in program The patient should benefit from training in stress management and relaxation.      Screening Interventions   Interventions Encouraged to exercise;To provide support and resources with identified psychosocial needs    Expected Outcomes Short Term goal: Utilizing psychosocial counselor, staff and physician to assist with identification of specific Stressors or current issues interfering with healing process. Setting desired goal for each stressor or current issue identified.;Long Term Goal: Stressors or current issues are controlled or eliminated.;Short Term goal: Identification and review with participant of any Quality of Life or Depression concerns found by scoring the questionnaire.;Long Term goal: The participant improves quality of Life and PHQ9 Scores as seen by post scores and/or verbalization of changes             Quality of Life Scores:  Scores  of 19 and below usually indicate a poorer quality of  life in these areas.  A difference of  2-3 points is a clinically meaningful difference.  A difference of 2-3 points in the total score of the Quality of Life Index has been associated with significant improvement in overall quality of life, self-image, physical symptoms, and general health in studies assessing change in quality of life.  PHQ-9: Review Flowsheet       12/27/2021 11/29/2020 09/24/2020  Depression screen PHQ 2/9  Decreased Interest 0 0 0 0 0  Down, Depressed, Hopeless 1 1 0 0 0  PHQ - 2 Score 1 1 0 0 0  Altered sleeping 1 0 2  Tired, decreased energy 0 2 1  Change in appetite 0 0 0  Feeling bad or failure about yourself  0 0 0  Trouble concentrating 0 0 0  Moving slowly or fidgety/restless 0 0 0  Suicidal thoughts 0 0 0  PHQ-9 Score 2 2 -  Difficult doing work/chores Not difficult at all Not difficult at all Not difficult at all   Interpretation of Total Score  Total Score Depression Severity:  1-4 = Minimal depression, 5-9 = Mild depression, 10-14 = Moderate depression, 15-19 = Moderately severe depression, 20-27 = Severe depression   Psychosocial Evaluation and Intervention:  Psychosocial Evaluation - 12/27/21 1431       Psychosocial Evaluation & Interventions   Interventions Stress management education;Relaxation education;Encouraged to exercise with the program and follow exercise prescription    Comments --    Expected Outcomes Carmelia to participate in pulmonary rehab    Continue Psychosocial Services  Follow up required by staff             Psychosocial Re-Evaluation:   Psychosocial Discharge (Final Psychosocial Re-Evaluation):   Education: Education Goals: Education classes will be provided on a weekly basis, covering required topics. Participant will state understanding/return demonstration of topics presented.  Learning Barriers/Preferences:  Learning Barriers/Preferences - 12/27/21 1432       Learning Barriers/Preferences   Learning Preferences  Audio;Group Instruction;Individual Instruction;Verbal Instruction             Education Topics: Risk Factor Reduction:  -Group instruction that is supported by a PowerPoint presentation. Instructor discusses the definition of a risk factor, different risk factors for pulmonary disease, and how the heart and lungs work together.     Nutrition for Pulmonary Patient:  -Group instruction provided by PowerPoint slides, verbal discussion, and written materials to support subject matter. The instructor gives an explanation and review of healthy diet recommendations, which includes a discussion on weight management, recommendations for fruit and vegetable consumption, as well as protein, fluid, caffeine, fiber, sodium, sugar, and alcohol. Tips for eating when patients are short of breath are discussed. Flowsheet Row PULMONARY REHAB OTHER RESPIRATORY from 11/08/2020 in [place]  Date 11/08/20  Educator handout       Pursed Lip Breathing:  -Group instruction that is supported by demonstration and informational handouts. Instructor discusses the benefits of pursed lip and diaphragmatic breathing and detailed demonstration on how to preform both.     Oxygen Safety:  -Group instruction provided by PowerPoint, verbal discussion, and written material to support subject matter. There is an overview of "What is Oxygen" and "Why do we need it".  Instructor also reviews how to create a safe environment for oxygen use, the importance of using oxygen as prescribed, and the risks of noncompliance. There is a brief discussion  on traveling with oxygen and resources the patient may utilize. Flowsheet Row PULMONARY REHAB OTHER RESPIRATORY from 11/08/2020 in [place]  Date 11/01/20  Educator Handout       Oxygen Equipment:  -Group instruction provided by [facility] Staff utilizing handouts, written materials, and equipment  demonstrations.   Signs and Symptoms:  -Group instruction provided by written material and verbal discussion to support subject matter. Warning signs and symptoms of infection, stroke, and heart attack are reviewed and when to call the physician/911 reinforced. Tips for preventing the spread of infection discussed.   Advanced Directives:  -Group instruction provided by verbal instruction and written material to support subject matter. Instructor reviews Advanced Directive laws and proper instruction for filling out document.   Pulmonary Video:  -Group video education that reviews the importance of medication and oxygen compliance, exercise, good nutrition, pulmonary hygiene, and pursed lip and diaphragmatic breathing for the pulmonary patient.   Exercise for the Pulmonary Patient:  -Group instruction that is supported by a PowerPoint presentation. Instructor discusses benefits of exercise, core components of exercise, frequency, duration, and intensity of an exercise routine, importance of utilizing pulse oximetry during exercise, safety while exercising, and options of places to exercise outside of rehab.     Pulmonary Medications:  -Verbally interactive group education provided by instructor with focus on inhaled medications and proper administration.   Anatomy and Physiology of the Respiratory System and Intimacy:  -Group instruction provided by PowerPoint, verbal discussion, and written material to support subject matter. Instructor reviews respiratory cycle and anatomical components of the respiratory system and their functions. Instructor also reviews differences in obstructive and restrictive respiratory diseases with examples of each. Intimacy, Sex, and Sexuality differences are reviewed with a discussion on how relationships can change when diagnosed with pulmonary disease. Common sexual concerns are reviewed. Flowsheet Row PULMONARY REHAB OTHER RESPIRATORY from 11/08/2020 in [place]  Date 10/25/20  Educator Jess-H/O  Instruction Review Code 1- Verbalizes Understanding       MD DAY -A group question and answer session with a medical doctor that allows participants to ask questions that relate to their pulmonary disease state.   OTHER EDUCATION -Group or individual verbal, written, or video instructions that support the educational goals of the pulmonary rehab program. [place] from 11/08/2020 in [place]  Date 10/04/20  [facility] [place]  Educator handout       Holiday Eating Survival Tips:  -Group instruction provided by PowerPoint slides, verbal discussion, and written materials to support subject matter. The instructor gives patients tips, tricks, and techniques to help them not only survive but enjoy the holidays despite the onslaught of food that accompanies the holidays.   Knowledge Questionnaire Score:  Knowledge Questionnaire Score - 12/27/21 1448       Knowledge Questionnaire Score   Pre Score 16/18             Core Components/Risk Factors/Patient Goals at Admission:  Personal Goals and Risk Factors at Admission - 12/27/21 1433       Core Components/Risk Factors/Patient Goals on Admission    Weight Management Weight Loss    Improve shortness of breath with ADL's Yes    Intervention Provide education, individualized exercise plan and daily activity instruction to help decrease symptoms of SOB with activities of daily living.    Expected Outcomes Short Term: Improve cardiorespiratory fitness to achieve a reduction of symptoms  when performing ADLs;Long Term: Be able to perform more ADLs without symptoms or delay the onset of symptoms             Core Components/Risk Factors/Patient Goals Review:    Core Components/Risk Factors/Patient Goals at Discharge (Final Review):    ITP Comments:   Comments: Dr. Rodman Pickle is Medical  Director for Pulmonary Rehab at [facility].

## 2022-01-01 NOTE — Progress Notes (Signed)
Pulmonary Individual Treatment Plan  Patient Details  Name: Brandy Morales MRN: [id number] Date of Birth: 1946-04-03 Referring Provider:   [name] Pulmonary Rehab Walk Test from 12/27/2021 in [place]  Referring Provider [name]       Initial Encounter Date:  Flowsheet Row Pulmonary Rehab Walk Test from 12/27/2021 in [place]  Date 12/27/21       Visit Diagnosis: Chronic respiratory failure with hypoxia ([place])  Patient's Home Medications on Admission:   Current Outpatient Medications:    albuterol (VENTOLIN HFA) 108 (90 Base) MCG/ACT inhaler, Inhale 2 puffs into the lungs every 6 (six) hours as needed for wheezing or shortness of breath., Disp: , Rfl:    Artificial Tear Solution (GENTEAL TEARS OP), Place 1 drop into both eyes daily. , Disp: , Rfl:    atorvastatin (LIPITOR) 10 MG tablet, Take 5 mg by mouth 3 (three) times a week. Take on MWF, Disp: , Rfl:    Budeson-Glycopyrrol-Formoterol (BREZTRI AEROSPHERE) 160-9-4.8 MCG/ACT AERO, Inhale 2 puffs into the lungs in the morning and at bedtime., Disp: 10.7 g, Rfl: 5   cetirizine (ZYRTEC) 10 MG tablet, Take 10 mg by mouth daily., Disp: , Rfl:    Cholecalciferol (VITAMIN D3) 5000 units CAPS, Take 5,000 Units by mouth daily., Disp: , Rfl:    citalopram (CELEXA) 20 MG tablet, Take 20 mg by mouth daily., Disp: , Rfl:    ezetimibe (ZETIA) 10 MG tablet, Take 10 mg by mouth at bedtime., Disp: , Rfl:    fluticasone (FLONASE) 50 MCG/ACT nasal spray, Place 1 spray into both nostrils daily. , Disp: , Rfl:    losartan-hydrochlorothiazide (HYZAAR) 100-25 MG tablet, Take 0.5 tablets by mouth daily., Disp: , Rfl:    Multiple Vitamin (MULTIVITAMIN) tablet, Take 1 tablet by mouth daily., Disp: , Rfl:    omeprazole (PRILOSEC) 20 MG capsule, Take 20 mg by mouth daily. , Disp: , Rfl:    OXYGEN, Inhale 2 L into the lungs continuous. , Disp: , Rfl:    acyclovir (ZOVIRAX) 400 MG tablet,  Take 1 tablet (400 mg total) by mouth 3 (three) times daily. (Patient not taking: Reported on 12/27/2021), Disp: 12 tablet, Rfl: 0   albuterol (ACCUNEB) 1.25 MG/3ML nebulizer solution, Take 1 ampule by nebulization every 6 (six) hours as needed for wheezing. (Patient not taking: Reported on 12/27/2021), Disp: , Rfl:    Budeson-Glycopyrrol-Formoterol (BREZTRI AEROSPHERE) 160-9-4.8 MCG/ACT AERO, Inhale 2 puffs into the lungs in the morning and at bedtime. (Patient not taking: Reported on 12/27/2021), Disp: 1 each, Rfl: 0  Past Medical History: Past Medical History:  Diagnosis Date   Anxiety    Asthma    Cancer ([place])    COPD (chronic obstructive pulmonary disease) (HCC)    GERD (gastroesophageal reflux disease)    History of kidney stones    History of radiation therapy 01/05/2020   IMRT right lung 54 Gy in 3 Fractions 12/29/2019 through 01/05/2020  Dr Gery Pray   Hypertension     Tobacco Use: Social History   Tobacco Use  Smoking Status Former   Packs/day: 2.00   Years: 55.00   Total pack years: 110.00   Types: Cigarettes   Quit date: 2016   Years since quitting: 7.6  Smokeless Tobacco Never    Labs: Review Flowsheet       Latest Ref Rng & Units 12/28/2020  Labs for ITP Cardiac and Pulmonary Rehab  TCO2 22 - 32 mmol/L 26  Capillary Blood Glucose: Lab Results  Component Value Date   GLUCAP 81 10/12/2019   GLUCAP 91 05/11/2017     Pulmonary Assessment Scores:  Pulmonary Assessment Scores     Row Name 12/27/21 1448         ADL UCSD   ADL Phase Entry     SOB Score total 102       CAT Score   CAT Score 30       mMRC Score   mMRC Score 4             UCSD: Self-administered rating of dyspnea associated with activities of daily living (ADLs) 6-point scale (0 = "not at all" to 5 = "maximal or unable to do because of breathlessness")  Scoring Scores range from 0 to 120.  Minimally important difference is 5 units  CAT: CAT can identify the health impairment  of COPD patients and is better correlated with disease progression.  CAT has a scoring range of zero to 40. The CAT score is classified into four groups of low (less than 10), medium (10 - 20), high (21-30) and very high (31-40) based on the impact level of disease on health status. A CAT score over 10 suggests significant symptoms.  A worsening CAT score could be explained by an exacerbation, poor medication adherence, poor inhaler technique, or progression of COPD or comorbid conditions.  CAT MCID is 2 points  mMRC: mMRC (Modified Medical Research Council) Dyspnea Scale is used to assess the degree of baseline functional disability in patients of respiratory disease due to dyspnea. No minimal important difference is established. A decrease in score of 1 point or greater is considered a positive change.   Pulmonary Function Assessment:  Pulmonary Function Assessment - 12/27/21 1445       Breath   Bilateral Breath Sounds Decreased    Shortness of Breath Yes;Fear of Shortness of Breath;Limiting activity             Exercise Target Goals: Exercise Program Goal: Individual exercise prescription set using results from initial 6 min walk test and THRR while considering  patient's activity barriers and safety.   Exercise Prescription Goal: Initial exercise prescription builds to 30-45 minutes a day of aerobic activity, 2-3 days per week.  Home exercise guidelines will be given to patient during program as part of exercise prescription that the participant will acknowledge.  Activity Barriers & Risk Stratification:  Activity Barriers & Cardiac Risk Stratification - 12/27/21 1438       Activity Barriers & Cardiac Risk Stratification   Activity Barriers Deconditioning;Muscular Weakness;Shortness of Breath;History of Falls;Balance Concerns    Cardiac Risk Stratification Low             6 Minute Walk:  6 Minute Walk     Row Name 12/27/21 1543         6 Minute Walk   Phase  Initial     Distance 790 feet     Walk Time 6 minutes     # of Rest Breaks 2     MPH 1.5     METS 1.75     RPE 13     Perceived Dyspnea  3     VO2 Peak 6.12     Symptoms Yes (comment)     Comments SOB and fatigue     Resting HR 107 bpm     Resting BP 105/70     Resting Oxygen Saturation  93 %  Exercise Oxygen Saturation  during 6 min walk 88 %     Max Ex. HR 117 bpm     Max Ex. BP 131/84     2 Minute Post BP 112/81       Interval HR   1 Minute HR 107     2 Minute HR 113     3 Minute HR 115     4 Minute HR 111     5 Minute HR 117     6 Minute HR 116     2 Minute Post HR 100     Interval Heart Rate? Yes       Interval Oxygen   Interval Oxygen? Yes     Baseline Oxygen Saturation % 93 %     1 Minute Oxygen Saturation % 93 %     1 Minute Liters of Oxygen 3 L     2 Minute Oxygen Saturation % 89 %     2 Minute Liters of Oxygen 3 L     3 Minute Oxygen Saturation % 89 %     3 Minute Liters of Oxygen 3 L     4 Minute Oxygen Saturation % 89 %     4 Minute Liters of Oxygen 3 L     5 Minute Oxygen Saturation % 88 %     5 Minute Liters of Oxygen 3 L     6 Minute Oxygen Saturation % 89 %     6 Minute Liters of Oxygen 3 L     2 Minute Post Oxygen Saturation % 96 %     2 Minute Post Liters of Oxygen 3 L              Oxygen Initial Assessment:  Oxygen Initial Assessment - 12/27/21 1436       Home Oxygen   Home Oxygen Device Home Concentrator    Sleep Oxygen Prescription Continuous    Liters per minute 2    Home Exercise Oxygen Prescription Pulsed    Liters per minute 3    Home Resting Oxygen Prescription Continuous    Liters per minute 2    Compliance with Home Oxygen Use Yes      Initial 6 min Walk   Oxygen Used Continuous    Liters per minute 3      Program Oxygen Prescription   Program Oxygen Prescription Continuous    Liters per minute 3    Comments used 3L during 6 MWT with 2 breaks for fatigue/SOB and SaO2 88      Intervention   Short Term Goals To  learn and exhibit compliance with exercise, home and travel O2 prescription;To learn and understand importance of monitoring SPO2 with pulse oximeter and demonstrate accurate use of the pulse oximeter.;To learn and understand importance of maintaining oxygen saturations>88%;To learn and demonstrate proper pursed lip breathing techniques or other breathing techniques. ;To learn and demonstrate proper use of respiratory medications    Long  Term Goals Exhibits compliance with exercise, home  and travel O2 prescription;Verbalizes importance of monitoring SPO2 with pulse oximeter and return demonstration;Maintenance of O2 saturations>88%;Exhibits proper breathing techniques, such as pursed lip breathing or other method taught during program session;Compliance with respiratory medication;Demonstrates proper use of MDI's             Oxygen Re-Evaluation:  Oxygen Re-Evaluation     Row Name 01/01/22 0729             Program Oxygen Prescription  Program Oxygen Prescription Continuous       Liters per minute 3       Comments used 3L during 6 MWT with 2 breaks for fatigue/SOB and SaO2 88         Home Oxygen   Home Oxygen Device Home Concentrator       Sleep Oxygen Prescription Continuous       Liters per minute 2       Home Exercise Oxygen Prescription Pulsed       Liters per minute 3       Home Resting Oxygen Prescription Continuous       Liters per minute 2       Compliance with Home Oxygen Use Yes         Goals/Expected Outcomes   Short Term Goals To learn and exhibit compliance with exercise, home and travel O2 prescription;To learn and understand importance of monitoring SPO2 with pulse oximeter and demonstrate accurate use of the pulse oximeter.;To learn and understand importance of maintaining oxygen saturations>88%;To learn and demonstrate proper pursed lip breathing techniques or other breathing techniques. ;To learn and demonstrate proper use of respiratory medications       Long   Term Goals Exhibits compliance with exercise, home  and travel O2 prescription;Verbalizes importance of monitoring SPO2 with pulse oximeter and return demonstration;Maintenance of O2 saturations>88%;Exhibits proper breathing techniques, such as pursed lip breathing or other method taught during program session;Compliance with respiratory medication;Demonstrates proper use of MDI's       Goals/Expected Outcomes Compliance and understanding of oxygen saturation monitoring and breathing techniques to decrease shortness of breath.                Oxygen Discharge (Final Oxygen Re-Evaluation):  Oxygen Re-Evaluation - 01/01/22 0729       Program Oxygen Prescription   Program Oxygen Prescription Continuous    Liters per minute 3    Comments used 3L during 6 MWT with 2 breaks for fatigue/SOB and SaO2 88      Home Oxygen   Home Oxygen Device Home Concentrator    Sleep Oxygen Prescription Continuous    Liters per minute 2    Home Exercise Oxygen Prescription Pulsed    Liters per minute 3    Home Resting Oxygen Prescription Continuous    Liters per minute 2    Compliance with Home Oxygen Use Yes      Goals/Expected Outcomes   Short Term Goals To learn and exhibit compliance with exercise, home and travel O2 prescription;To learn and understand importance of monitoring SPO2 with pulse oximeter and demonstrate accurate use of the pulse oximeter.;To learn and understand importance of maintaining oxygen saturations>88%;To learn and demonstrate proper pursed lip breathing techniques or other breathing techniques. ;To learn and demonstrate proper use of respiratory medications    Long  Term Goals Exhibits compliance with exercise, home  and travel O2 prescription;Verbalizes importance of monitoring SPO2 with pulse oximeter and return demonstration;Maintenance of O2 saturations>88%;Exhibits proper breathing techniques, such as pursed lip breathing or other method taught during program session;Compliance  with respiratory medication;Demonstrates proper use of MDI's    Goals/Expected Outcomes Compliance and understanding of oxygen saturation monitoring and breathing techniques to decrease shortness of breath.             Initial Exercise Prescription:  Initial Exercise Prescription - 12/27/21 1500       Date of Initial Exercise RX and Referring Provider   Date 12/27/21    Referring Provider [name]  Expected Discharge Date 03/06/22      Oxygen   Oxygen Continuous    Liters 3    Maintain Oxygen Saturation 88% or higher      Recumbant Bike   Level 1    RPM 70    Watts 1    Minutes 15      Track   Minutes 15    METs 1.75      Prescription Details   Frequency (times per week) 2    Duration Progress to 30 minutes of continuous aerobic without signs/symptoms of physical distress      Intensity   THRR 40-80% of Max Heartrate 58-116    Ratings of Perceived Exertion 11-13    Perceived Dyspnea 0-4      Progression   Progression Continue to progress workloads to maintain intensity without signs/symptoms of physical distress.      Resistance Training   Training Prescription Yes    Weight red bands    Reps 10-15             Perform Capillary Blood Glucose checks as needed.  Exercise Prescription Changes:   Exercise Comments:   Exercise Goals and Review:   Exercise Goals     Row Name 12/27/21 1548 12/31/21 0746           Exercise Goals   Increase Physical Activity Yes Yes      Intervention Provide advice, education, support and counseling about physical activity/exercise needs.;Develop an individualized exercise prescription for aerobic and resistive training based on initial evaluation findings, risk stratification, comorbidities and participant's personal goals. Provide advice, education, support and counseling about physical activity/exercise needs.;Develop an individualized exercise prescription for aerobic and resistive training based on initial  evaluation findings, risk stratification, comorbidities and participant's personal goals.      Expected Outcomes Short Term: Attend rehab on a regular basis to increase amount of physical activity.;Long Term: Add in home exercise to make exercise part of routine and to increase amount of physical activity.;Long Term: Exercising regularly at least 3-5 days a week. Short Term: Attend rehab on a regular basis to increase amount of physical activity.;Long Term: Add in home exercise to make exercise part of routine and to increase amount of physical activity.;Long Term: Exercising regularly at least 3-5 days a week.      Increase Strength and Stamina Yes Yes      Intervention Provide advice, education, support and counseling about physical activity/exercise needs.;Develop an individualized exercise prescription for aerobic and resistive training based on initial evaluation findings, risk stratification, comorbidities and participant's personal goals. Provide advice, education, support and counseling about physical activity/exercise needs.;Develop an individualized exercise prescription for aerobic and resistive training based on initial evaluation findings, risk stratification, comorbidities and participant's personal goals.      Expected Outcomes Short Term: Increase workloads from initial exercise prescription for resistance, speed, and METs.;Short Term: Perform resistance training exercises routinely during rehab and add in resistance training at home;Long Term: Improve cardiorespiratory fitness, muscular endurance and strength as measured by increased METs and functional capacity (6MWT) Short Term: Increase workloads from initial exercise prescription for resistance, speed, and METs.;Short Term: Perform resistance training exercises routinely during rehab and add in resistance training at home;Long Term: Improve cardiorespiratory fitness, muscular endurance and strength as measured by increased METs and  functional capacity (6MWT)      Able to understand and use rate of perceived exertion (RPE) scale Yes Yes      Intervention Provide education and explanation  on how to use RPE scale Provide education and explanation on how to use RPE scale      Expected Outcomes Short Term: Able to use RPE daily in rehab to express subjective intensity level;Long Term:  Able to use RPE to guide intensity level when exercising independently Short Term: Able to use RPE daily in rehab to express subjective intensity level;Long Term:  Able to use RPE to guide intensity level when exercising independently      Able to understand and use Dyspnea scale Yes Yes      Intervention Provide education and explanation on how to use Dyspnea scale Provide education and explanation on how to use Dyspnea scale      Expected Outcomes Short Term: Able to use Dyspnea scale daily in rehab to express subjective sense of shortness of breath during exertion;Long Term: Able to use Dyspnea scale to guide intensity level when exercising independently Short Term: Able to use Dyspnea scale daily in rehab to express subjective sense of shortness of breath during exertion;Long Term: Able to use Dyspnea scale to guide intensity level when exercising independently      Knowledge and understanding of Target Heart Rate Range (THRR) Yes Yes      Intervention Provide education and explanation of THRR including how the numbers were predicted and where they are located for reference Provide education and explanation of THRR including how the numbers were predicted and where they are located for reference      Expected Outcomes Short Term: Able to state/look up THRR;Long Term: Able to use THRR to govern intensity when exercising independently;Short Term: Able to use daily as guideline for intensity in rehab Short Term: Able to state/look up THRR;Long Term: Able to use THRR to govern intensity when exercising independently;Short Term: Able to use daily as guideline  for intensity in rehab      Understanding of Exercise Prescription Yes Yes      Intervention Provide education, explanation, and written materials on patient's individual exercise prescription Provide education, explanation, and written materials on patient's individual exercise prescription      Expected Outcomes Short Term: Able to explain program exercise prescription;Long Term: Able to explain home exercise prescription to exercise independently Short Term: Able to explain program exercise prescription;Long Term: Able to explain home exercise prescription to exercise independently               Exercise Goals Re-Evaluation :  Exercise Goals Re-Evaluation     Row Name 12/31/21 0746             Exercise Goal Re-Evaluation   Exercise Goals Review Increase Physical Activity;Increase Strength and Stamina;Able to understand and use rate of perceived exertion (RPE) scale;Able to understand and use Dyspnea scale;Knowledge and understanding of Target Heart Rate Range (THRR);Understanding of Exercise Prescription       Comments Brandy Morales scheduled to begin exercise next week. Will continue to monitor and progress as able.       Expected Outcomes Through exercise at rehab and home, the patient will decrease shortness of breath with daily activities and feel confident in carrying out an exercise regimn at home.                Discharge Exercise Prescription (Final Exercise Prescription Changes):   Nutrition:  Target Goals: Understanding of nutrition guidelines, daily intake of sodium <1538m, cholesterol <2032m calories 30% from fat and 7% or less from saturated fats, daily to have 5 or more servings of fruits and vegetables.  Biometrics:  Pre Biometrics - 12/27/21 1548       Pre Biometrics   Grip Strength 20 kg              Nutrition Therapy Plan and Nutrition Goals:   Nutrition Assessments:  MEDIFICTS Score Key: ?70 Need to make dietary changes  40-70 Heart Healthy  Diet ? 40 Therapeutic Level Cholesterol Diet  Flowsheet Row PULMONARY REHAB OTHER RESPIRATORY from 11/29/2020 in [place]  Picture Your Plate Total Score on Discharge 60      Picture Your Plate Scores: <75 Unhealthy dietary pattern with much room for improvement. 41-50 Dietary pattern unlikely to meet recommendations for good health and room for improvement. 51-60 More healthful dietary pattern, with some room for improvement.  >60 Healthy dietary pattern, although there may be some specific behaviors that could be improved.    Nutrition Goals Re-Evaluation:   Nutrition Goals Discharge (Final Nutrition Goals Re-Evaluation):   Psychosocial: Target Goals: Acknowledge presence or absence of significant depression and/or stress, maximize coping skills, provide positive support system. Participant is able to verbalize types and ability to use techniques and skills needed for reducing stress and depression.  Initial Review & Psychosocial Screening:  Initial Psych Review & Screening - 12/27/21 1428       Initial Review   Current issues with None Identified      Family Dynamics   Good Support System? Yes    Comments step daughter Erasmo Downer and her husband, friends      Barriers   Psychosocial barriers to participate in program The patient should benefit from training in stress management and relaxation.      Screening Interventions   Interventions Encouraged to exercise;To provide support and resources with identified psychosocial needs    Expected Outcomes Short Term goal: Utilizing psychosocial counselor, staff and physician to assist with identification of specific Stressors or current issues interfering with healing process. Setting desired goal for each stressor or current issue identified.;Long Term Goal: Stressors or current issues are controlled or eliminated.;Short Term goal: Identification and review with participant of any Quality of Life or  Depression concerns found by scoring the questionnaire.;Long Term goal: The participant improves quality of Life and PHQ9 Scores as seen by post scores and/or verbalization of changes             Quality of Life Scores:  Scores of 19 and below usually indicate a poorer quality of life in these areas.  A difference of  2-3 points is a clinically meaningful difference.  A difference of 2-3 points in the total score of the Quality of Life Index has been associated with significant improvement in overall quality of life, self-image, physical symptoms, and general health in studies assessing change in quality of life.  PHQ-9: Review Flowsheet       12/27/2021 11/29/2020 09/24/2020  Depression screen PHQ 2/9  Decreased Interest 0 0 0 0 0  Down, Depressed, Hopeless 1 1 0 0 0  PHQ - 2 Score 1 1 0 0 0  Altered sleeping 1 0 2  Tired, decreased energy 0 2 1  Change in appetite 0 0 0  Feeling bad or failure about yourself  0 0 0  Trouble concentrating 0 0 0  Moving slowly or fidgety/restless 0 0 0  Suicidal thoughts 0 0 0  PHQ-9 Score 2 2 -  Difficult doing work/chores Not difficult at all Not difficult at all Not difficult at all   Interpretation of  Total Score  Total Score Depression Severity:  1-4 = Minimal depression, 5-9 = Mild depression, 10-14 = Moderate depression, 15-19 = Moderately severe depression, 20-27 = Severe depression   Psychosocial Evaluation and Intervention:  Psychosocial Evaluation - 12/27/21 1431       Psychosocial Evaluation & Interventions   Interventions Stress management education;Relaxation education;Encouraged to exercise with the program and follow exercise prescription    Comments --    Expected Outcomes Brandy Morales to participate in pulmonary rehab    Continue Psychosocial Services  Follow up required by staff             Psychosocial Re-Evaluation:  Psychosocial Re-Evaluation     Brandy Morales Name 01/01/22 0920             Psychosocial Re-Evaluation    Current issues with None Identified       Comments Brandy Morales has attended the PR orientation session 12/27/21 and is scheduled to start the program 01/02/22. Will continue to monitor for any psychosocial barriers.       Expected Outcomes For her to participate in the PR program without any psychosocial issues or concerns.       Interventions Relaxation education;Stress management education       Continue Psychosocial Services  Follow up required by staff                Psychosocial Discharge (Final Psychosocial Re-Evaluation):  Psychosocial Re-Evaluation - 01/01/22 0920       Psychosocial Re-Evaluation   Current issues with None Identified    Comments Brandy Morales has attended the PR orientation session 12/27/21 and is scheduled to start the program 01/02/22. Will continue to monitor for any psychosocial barriers.    Expected Outcomes For her to participate in the PR program without any psychosocial issues or concerns.    Interventions Relaxation education;Stress management education    Continue Psychosocial Services  Follow up required by staff             Education: Education Goals: Education classes will be provided on a weekly basis, covering required topics. Participant will state understanding/return demonstration of topics presented.  Learning Barriers/Preferences:  Learning Barriers/Preferences - 12/27/21 1432       Learning Barriers/Preferences   Learning Preferences Audio;Group Instruction;Individual Instruction;Verbal Instruction             Education Topics: Risk Factor Reduction:  -Group instruction that is supported by a PowerPoint presentation. Instructor discusses the definition of a risk factor, different risk factors for pulmonary disease, and how the heart and lungs work together.     Nutrition for Pulmonary Patient:  -Group instruction provided by PowerPoint slides, verbal discussion, and written materials to support subject matter. The instructor gives an  explanation and review of healthy diet recommendations, which includes a discussion on weight management, recommendations for fruit and vegetable consumption, as well as protein, fluid, caffeine, fiber, sodium, sugar, and alcohol. Tips for eating when patients are short of breath are discussed. Flowsheet Row PULMONARY REHAB OTHER RESPIRATORY from 11/08/2020 in [place]  Date 11/08/20  Educator handout       Pursed Lip Breathing:  -Group instruction that is supported by demonstration and informational handouts. Instructor discusses the benefits of pursed lip and diaphragmatic breathing and detailed demonstration on how to preform both.     Oxygen Safety:  -Group instruction provided by PowerPoint, verbal discussion, and written material to support subject matter. There is an overview of "What is Oxygen" and "Why do  we need it".  Instructor also reviews how to create a safe environment for oxygen use, the importance of using oxygen as prescribed, and the risks of noncompliance. There is a brief discussion on traveling with oxygen and resources the patient may utilize. Flowsheet Row PULMONARY REHAB OTHER RESPIRATORY from 11/08/2020 in [place]  Date 11/01/20  Educator Handout       Oxygen Equipment:  -Group instruction provided by [facility] Staff utilizing handouts, written materials, and equipment demonstrations.   Signs and Symptoms:  -Group instruction provided by written material and verbal discussion to support subject matter. Warning signs and symptoms of infection, stroke, and heart attack are reviewed and when to call the physician/911 reinforced. Tips for preventing the spread of infection discussed.   Advanced Directives:  -Group instruction provided by verbal instruction and written material to support subject matter. Instructor reviews Advanced Directive laws and proper instruction for filling out  document.   Pulmonary Video:  -Group video education that reviews the importance of medication and oxygen compliance, exercise, good nutrition, pulmonary hygiene, and pursed lip and diaphragmatic breathing for the pulmonary patient.   Exercise for the Pulmonary Patient:  -Group instruction that is supported by a PowerPoint presentation. Instructor discusses benefits of exercise, core components of exercise, frequency, duration, and intensity of an exercise routine, importance of utilizing pulse oximetry during exercise, safety while exercising, and options of places to exercise outside of rehab.     Pulmonary Medications:  -Verbally interactive group education provided by instructor with focus on inhaled medications and proper administration.   Anatomy and Physiology of the Respiratory System and Intimacy:  -Group instruction provided by PowerPoint, verbal discussion, and written material to support subject matter. Instructor reviews respiratory cycle and anatomical components of the respiratory system and their functions. Instructor also reviews differences in obstructive and restrictive respiratory diseases with examples of each. Intimacy, Sex, and Sexuality differences are reviewed with a discussion on how relationships can change when diagnosed with pulmonary disease. Common sexual concerns are reviewed. Flowsheet Row PULMONARY REHAB OTHER RESPIRATORY from 11/08/2020 in [place]  Date 10/25/20  Educator Jess-H/O  Instruction Review Code 1- Verbalizes Understanding       MD DAY -A group question and answer session with a medical doctor that allows participants to ask questions that relate to their pulmonary disease state.   OTHER EDUCATION -Group or individual verbal, written, or video instructions that support the educational goals of the pulmonary rehab program. [place] from 11/08/2020 in [place]  Date 10/04/20  [facility]  Educator handout       Holiday Eating Survival Tips:  -Group instruction provided by PowerPoint slides, verbal discussion, and written materials to support subject matter. The instructor gives patients tips, tricks, and techniques to help them not only survive but enjoy the holidays despite the onslaught of food that accompanies the holidays.   Knowledge Questionnaire Score:  Knowledge Questionnaire Score - 12/27/21 1448       Knowledge Questionnaire Score   Pre Score 16/18             Core Components/Risk Factors/Patient Goals at Admission:  Personal Goals and Risk Factors at Admission - 12/27/21 1433       Core Components/Risk Factors/Patient Goals on Admission    Weight Management Weight Loss    Improve shortness of breath with ADL's Yes    Intervention Provide education, individualized  exercise plan and daily activity instruction to help decrease symptoms of SOB with activities of daily living.    Expected Outcomes Short Term: Improve cardiorespiratory fitness to achieve a reduction of symptoms when performing ADLs;Long Term: Be able to perform more ADLs without symptoms or delay the onset of symptoms             Core Components/Risk Factors/Patient Goals Review:   Goals and Risk Factor Review     Row Name 01/01/22 0923 01/01/22 0924           Core Components/Risk Factors/Patient Goals Review   Personal Goals Review Improve shortness of breath with ADL's;Develop more efficient breathing techniques such as purse lipped breathing and diaphragmatic breathing and practicing self-pacing with activity.;Increase knowledge of respiratory medications and ability to use respiratory devices properly. Improve shortness of breath with ADL's;Other      Review -- Weight loss is a concern for her. She is scheduled to start the program 01/02/22. We will continue to monior core components.      Expected Outcomes -- See admission  goals.               Core Components/Risk Factors/Patient Goals at Discharge (Final Review):   Goals and Risk Factor Review - 01/01/22 0924       Core Components/Risk Factors/Patient Goals Review   Personal Goals Review Improve shortness of breath with ADL's;Other    Review Weight loss is a concern for her. She is scheduled to start the program 01/02/22. We will continue to monior core components.    Expected Outcomes See admission goals.             ITP Comments: Recommend continued exercise, life style modification, education, and utilization of breathing techniques to increase stamina and strength, while also decreasing shortness of breath with exertion.  Dr. Rodman Pickle is Medical Director for Pulmonary Rehab at [facility].

## 2022-01-02 ENCOUNTER — Encounter (HOSPITAL_COMMUNITY)
Admission: RE | Admit: 2022-01-02 | Discharge: 2022-01-02 | Disposition: A | Discharge status: Home / Self Care | Payer: PPO | Source: Ambulatory Visit | Attending: Pulmonary Disease | Admitting: Pulmonary Disease

## 2022-01-02 VITALS — Wt 171.3 lb

## 2022-01-02 DIAGNOSIS — J9611 Chronic respiratory failure with hypoxia: Secondary | ICD-10-CM

## 2022-01-02 NOTE — Progress Notes (Signed)
Daily Session Note  Patient Details  Name: Brandy Morales MRN: [id number] Date of Birth: 03/22/1946 Referring Provider:   [name] Pulmonary Rehab Walk Test from 12/27/2021 in [place]  Referring Provider [name]       Encounter Date: 01/02/2022  Check In:  Session Check In - 01/02/22 1434       Check-In   Supervising physician immediately available to respond to emergencies [facility] immediately available    Physician(s) Dr. Posey Pronto    Location [facility]    Staff Present Rosebud Poles, RN, BSN;Randi Olen Cordial BS, ACSM-CEP, Exercise Physiologist;Kaylee Rosana Hoes, MS, ACSM-CEP, Exercise Physiologist;Lisa Ysidro Evert, RN    Virtual Visit No    Medication changes reported     No    Fall or balance concerns reported    No    Tobacco Cessation No Change    Warm-up and Cool-down Performed as group-led instruction    Resistance Training Performed Yes    VAD Patient? No    PAD/SET Patient? No      Pain Assessment   Currently in Pain? No/denies    Multiple Pain Sites No             Capillary Blood Glucose: No results found for this or any previous visit (from the past 24 hour(s)).    Social History   Tobacco Use  Smoking Status Former   Packs/day: 2.00   Years: 55.00   Total pack years: 110.00   Types: Cigarettes   Quit date: 2016   Years since quitting: 7.6  Smokeless Tobacco Never    Goals Met: Strength training   Goals Unmet:  Did not tolerate exercise well, c/o dizziness at the end of the session, BP dropped to 99/54, given water and ginger ale, BP increased to 105/64 and dizziness improved.  Will need to have Veretta take more rest breaks on the next exercise session.  Comments: Service time is from 1324 to 1600.    Dr. Rodman Pickle is Medical Director for Pulmonary Rehab at [facility].

## 2022-01-02 NOTE — Progress Notes (Signed)
At the end of Brandy Morales's first day exercising in pulmonary rehab she became dizzy and BP dropped slightly to 99/54.  We gave her water and ginger ale and BP increased to 105/64 and felt better after 10 minutes.  I walked her to the bathroom without dizziness or difficulty.  Her daughter, Brandy Morales was called and will come and pick her up.  She feels it is better she not drive.

## 2022-01-07 ENCOUNTER — Encounter (HOSPITAL_COMMUNITY): Payer: PPO

## 2022-01-07 ENCOUNTER — Telehealth (HOSPITAL_COMMUNITY): Payer: Self-pay | Admitting: Internal Medicine

## 2022-01-07 ENCOUNTER — Telehealth (HOSPITAL_COMMUNITY): Payer: Self-pay | Admitting: *Deleted

## 2022-01-07 NOTE — Telephone Encounter (Addendum)
Called Brandy Morales to check on her since she had called out today from [place].She states that she is still having a lot of SOB. I encouraged her to call the pulmonologist office due to her extended time of being SOB. She agrees that she should call and get an appointment.

## 2022-01-08 ENCOUNTER — Telehealth (HOSPITAL_COMMUNITY): Payer: Self-pay | Admitting: *Deleted

## 2022-01-08 ENCOUNTER — Encounter: Payer: Self-pay | Admitting: Pulmonary Disease

## 2022-01-08 ENCOUNTER — Ambulatory Visit: Payer: PPO | Admitting: Pulmonary Disease

## 2022-01-08 ENCOUNTER — Ambulatory Visit (INDEPENDENT_AMBULATORY_CARE_PROVIDER_SITE_OTHER): Payer: PPO

## 2022-01-08 VITALS — BP 120/84 | HR 70 | Ht 64.0 in | Wt 172.6 lb

## 2022-01-08 DIAGNOSIS — J441 Chronic obstructive pulmonary disease with (acute) exacerbation: Secondary | ICD-10-CM | POA: Diagnosis not present

## 2022-01-08 DIAGNOSIS — J9611 Chronic respiratory failure with hypoxia: Secondary | ICD-10-CM

## 2022-01-08 DIAGNOSIS — R0600 Dyspnea, unspecified: Secondary | ICD-10-CM | POA: Diagnosis not present

## 2022-01-08 MED ORDER — DOXYCYCLINE HYCLATE 100 MG PO TABS: 100.0000 mg | ORAL_TABLET | Freq: Two times a day (BID) | ORAL | 0 refills | Status: AC

## 2022-01-08 MED ORDER — PREDNISONE 10 MG PO TABS: 20.0000 mg | ORAL_TABLET | Freq: Every day | ORAL | 0 refills | Status: AC

## 2022-01-08 NOTE — Patient Instructions (Signed)
Acute exacerbation of COPD Take prednisone 20 mg daily x5 days Take doxycycline 100 mg twice a day x5 days, be careful with this if you go out in the sun: Use plenty of sunscreen and stay in the shade Keep taking Breztri as you are doing Keep using albuterol as needed for chest tightness wheezing or shortness of breath We will get a chest x-ray today  If things are worse then call us  Chronic respiratory failure with hypoxemia: Keep using 2 L of oxygen at rest, 3 with exertion  Follow-up with Dr. Chase Caller as previously arranged or sooner if needed.

## 2022-01-08 NOTE — Progress Notes (Signed)
Synopsis: Acute visit in August 2023; she has COPD, chronic respiratory failure on oxygen and had a PET avid nodule treated with SBRT, declined tissue sampling.  Subjective:   PATIENT ID: Brandy Morales GENDER: female DOB: Jul 12, 1945, MRN: [id number]   HPI  Chief Complaint  Patient presents with   Acute Visit    Increased SOB and wheezing for the past week. States she feels like she did too much at pulmonary rehab last week.     Brandy Morales is here to see me for dyspnea.  She started pulmonary rehab last week and really pushed herself more than normal  She says that she parked too far away and had to walk in the heat several blocks and then worked out for an hour.  By the weekend she went out to eat with friends.  On Sunday she says that she felt like she was getting a cold in that she had chest congestion, cough and mucus production.  She has noticed that her oxygen level was dropping to the 80's even though she was on oxygen.   Still taking Breztri.    No fever or chills.   Record review from her June 2023 visit with Brandy Morales where her COPD was managed with Brandy Morales and oxygen  Past Medical History:  Diagnosis Date   Anxiety    Asthma    Cancer ([place])    COPD (chronic obstructive pulmonary disease) ([place])    GERD (gastroesophageal reflux disease)    History of kidney stones    History of radiation therapy 01/05/2020   IMRT right lung 54 Gy in 3 Fractions 12/29/2019 through 01/05/2020  Dr Brandy Morales   Hypertension      No family history on file.   Social History   Socioeconomic History   Marital status: Widowed    Spouse name: Not on file   Number of children: Not on file   Years of education: Not on file   Highest education level: Not on file  Occupational History   Not on file  Tobacco Use   Smoking status: Former    Packs/day: 2.00    Years: 55.00    Total pack years: 110.00    Types: Cigarettes    Quit date: 2016    Years since quitting: 7.6   Smokeless  tobacco: Never  Vaping Use   Vaping Use: Never used  Substance and Sexual Activity   Alcohol use: Yes    Alcohol/week: 1.0 standard drink of alcohol    Types: 1 Glasses of wine per week    Comment: weekly   Drug use: Never   Sexual activity: Not Currently  Other Topics Concern   Not on file  Social History Narrative   Not on file   Social Determinants of Health   Financial Resource Strain: Not on file  Food Insecurity: Not on file  Transportation Needs: Not on file  Physical Activity: Not on file  Stress: Not on file  Social Connections: Not on file  Intimate Partner Violence: Not on file     Allergies  Allergen Reactions   Statins Cough   Latex Other (See Comments)    Broke out on face   Morphine And Related Other (See Comments)    Describes: "feel like I lose control of my body."   Oxycodone Other (See Comments)    Describes: "feels like I lose control of my body"     Outpatient Medications Prior to Visit  Medication Sig Dispense Refill  albuterol (ACCUNEB) 1.25 MG/3ML nebulizer solution Take 1 ampule by nebulization every 6 (six) hours as needed for wheezing.     albuterol (VENTOLIN HFA) 108 (90 Base) MCG/ACT inhaler Inhale 2 puffs into the lungs every 6 (six) hours as needed for wheezing or shortness of breath.     Artificial Tear Solution (GENTEAL TEARS OP) Place 1 drop into both eyes daily.      atorvastatin (LIPITOR) 10 MG tablet Take 5 mg by mouth 3 (three) times a week. Take on MWF     Budeson-Glycopyrrol-Formoterol (BREZTRI AEROSPHERE) 160-9-4.8 MCG/ACT AERO Inhale 2 puffs into the lungs in the morning and at bedtime. 10.7 g 5   cetirizine (ZYRTEC) 10 MG tablet Take 10 mg by mouth daily.     Cholecalciferol (VITAMIN D3) 5000 units CAPS Take 5,000 Units by mouth daily.     citalopram (CELEXA) 20 MG tablet Take 20 mg by mouth daily.     ezetimibe (ZETIA) 10 MG tablet Take 10 mg by mouth at bedtime.     fluticasone (FLONASE) 50 MCG/ACT nasal spray Place 1 spray  into both nostrils daily.      losartan-hydrochlorothiazide (HYZAAR) 100-25 MG tablet Take 0.5 tablets by mouth daily.     Multiple Vitamin (MULTIVITAMIN) tablet Take 1 tablet by mouth daily.     omeprazole (PRILOSEC) 20 MG capsule Take 20 mg by mouth daily.      OXYGEN Inhale 2 L into the lungs continuous.      Budeson-Glycopyrrol-Formoterol (BREZTRI AEROSPHERE) 160-9-4.8 MCG/ACT AERO Inhale 2 puffs into the lungs in the morning and at bedtime. 1 each 0   acyclovir (ZOVIRAX) 400 MG tablet Take 1 tablet (400 mg total) by mouth 3 (three) times daily. (Patient not taking: Reported on 12/27/2021) 12 tablet 0   No facility-administered medications prior to visit.    Review of Systems  Constitutional:  Negative for chills, diaphoresis, fever and weight loss.  HENT:  Negative for congestion, hearing loss and sore throat.   Respiratory:  Positive for cough, sputum production, shortness of breath and wheezing.       Objective:  Physical Exam   Vitals:   01/08/22 1109  BP: 120/84  Pulse: 70  SpO2: 96%  Weight: 172 lb 9.6 oz (78.3 kg)  Height: 5\' 4"  (1.626 m)   On 2L Colon  Gen: chronically ill appearing HENT: OP clear, TM's clear, neck supple PULM:poor air movement, normal percussion CV: RRR, no mgr, trace edema GI: BS+, soft, nontender Derm: no cyanosis or rash Psyche: normal mood and affect   CBC    Component Value Date/Time   WBC 8.5 01/02/2021 0503   RBC 4.40 01/02/2021 0503   RBC 4.32 01/02/2021 0503   HGB 12.3 01/02/2021 0503   HCT 39.5 01/02/2021 0503   PLT 145 (L) 01/02/2021 0503   MCV 91.4 01/02/2021 0503   MCH 28.5 01/02/2021 0503   MCHC 31.1 01/02/2021 0503   RDW 14.3 01/02/2021 0503   LYMPHSABS 0.5 (L) 12/29/2020 0657   MONOABS 0.8 12/29/2020 0657   EOSABS 0.0 12/29/2020 0657   BASOSABS 0.0 12/29/2020 0657     Chest imaging: 11/2021 CT chest images personally reviewed showing: Emphysema, stable right upper lobe nodule posttreatment, new 1 cm groundglass  nodule left upper lobe  PFT:  Labs:  Path:  Echo:  Heart Catheterization:       Assessment & Plan:   COPD with acute exacerbation (HCC)  Chronic respiratory failure with hypoxia ([place])  Discussion: Brandy Morales has an exacerbation  of her chronic COPD, mild to moderate in severity.  This may be due to a respiratory infection or the poor air quality we have experienced recently.  She did have some abnormality seen on the CT scan of her chest in the left upper lobe so I think it is worthwhile to get a chest x-ray today.  She has high risk for worsening given her advanced COPD and history of lung cancer.  Plan: Acute exacerbation of COPD Take prednisone 20 mg daily x5 days Take doxycycline 100 mg twice a day x5 days, be careful with this if you go out in the sun: Use plenty of sunscreen and stay in the shade Keep taking Breztri as you are doing Keep using albuterol as needed for chest tightness wheezing or shortness of breath We will get a chest x-ray today  If things are worse then call us  Chronic respiratory failure with hypoxemia: Keep using 2 L of oxygen at rest, 3 with exertion  Follow-up with Dr. Chase Caller as previously arranged or sooner if needed.   Current Outpatient Medications:    albuterol (ACCUNEB) 1.25 MG/3ML nebulizer solution, Take 1 ampule by nebulization every 6 (six) hours as needed for wheezing., Disp: , Rfl:    albuterol (VENTOLIN HFA) 108 (90 Base) MCG/ACT inhaler, Inhale 2 puffs into the lungs every 6 (six) hours as needed for wheezing or shortness of breath., Disp: , Rfl:    Artificial Tear Solution (GENTEAL TEARS OP), Place 1 drop into both eyes daily. , Disp: , Rfl:    atorvastatin (LIPITOR) 10 MG tablet, Take 5 mg by mouth 3 (three) times a week. Take on MWF, Disp: , Rfl:    Budeson-Glycopyrrol-Formoterol (BREZTRI AEROSPHERE) 160-9-4.8 MCG/ACT AERO, Inhale 2 puffs into the lungs in the morning and at bedtime., Disp: 10.7 g, Rfl: 5   cetirizine (ZYRTEC)  10 MG tablet, Take 10 mg by mouth daily., Disp: , Rfl:    Cholecalciferol (VITAMIN D3) 5000 units CAPS, Take 5,000 Units by mouth daily., Disp: , Rfl:    citalopram (CELEXA) 20 MG tablet, Take 20 mg by mouth daily., Disp: , Rfl:    ezetimibe (ZETIA) 10 MG tablet, Take 10 mg by mouth at bedtime., Disp: , Rfl:    fluticasone (FLONASE) 50 MCG/ACT nasal spray, Place 1 spray into both nostrils daily. , Disp: , Rfl:    losartan-hydrochlorothiazide (HYZAAR) 100-25 MG tablet, Take 0.5 tablets by mouth daily., Disp: , Rfl:    Multiple Vitamin (MULTIVITAMIN) tablet, Take 1 tablet by mouth daily., Disp: , Rfl:    omeprazole (PRILOSEC) 20 MG capsule, Take 20 mg by mouth daily. , Disp: , Rfl:    OXYGEN, Inhale 2 L into the lungs continuous. , Disp: , Rfl:

## 2022-01-08 NOTE — Telephone Encounter (Signed)
Brandy Morales called to report after her visit with the pulmonologist. She states that she was prescribed steroids and antibiotics. Brandy Morales is not planning to come to [place] this week but plans to return next week if she is able. She is going to call us know if she is not able to.

## 2022-01-09 ENCOUNTER — Encounter (HOSPITAL_COMMUNITY): Payer: PPO

## 2022-01-10 ENCOUNTER — Telehealth: Payer: Self-pay | Admitting: Pulmonary Disease

## 2022-01-10 NOTE — Telephone Encounter (Signed)
Spoke with pt who states she took 2 tabs of doxycycline yesterday morning and yesterday evening (total of 4 tabs) and 2 pills this morning before realizing correct instructions. Pt is asking if she should do anything and how for finish out the rest of the Doxycyline order. Dr. Lake Bells please advise.

## 2022-01-10 NOTE — Telephone Encounter (Signed)
Reviewed Dr. Lake Bells instructions with pt who stated understanding. Nothing further needed at this time.

## 2022-01-13 ENCOUNTER — Telehealth: Payer: Self-pay | Admitting: Pulmonary Disease

## 2022-01-13 MED ORDER — PREDNISONE 10 MG PO TABS: 20.0000 mg | ORAL_TABLET | Freq: Every day | ORAL | 0 refills | Status: AC

## 2022-01-13 NOTE — Telephone Encounter (Signed)
We can refill prednisone taper

## 2022-01-13 NOTE — Telephone Encounter (Signed)
Called and spoke with pt who states she finished prednisone today 8/21. Pt said that she does feel like her breathing is better but still having some increased SOB.  Due to this, pt is wanting to know if she should just ride it out over the next few days to see how things go or if she should have another round of prednisone to see if that would help her get back to baseline.  Pt has an upcoming appt with BW scheduled 8/30. Sending this to [facility] since pt has seen her prior and since her upcoming appt is with her. Beth, please advise on this for pt.

## 2022-01-13 NOTE — Telephone Encounter (Signed)
Prednisone refill sent. Called patient to update her and let her know that we were sending in refill. Nothing further needed

## 2022-01-14 ENCOUNTER — Encounter (HOSPITAL_COMMUNITY): Payer: PPO

## 2022-01-14 ENCOUNTER — Ambulatory Visit (HOSPITAL_COMMUNITY): Payer: PPO

## 2022-01-14 DIAGNOSIS — Z87891 Personal history of nicotine dependence: Secondary | ICD-10-CM | POA: Diagnosis not present

## 2022-01-14 DIAGNOSIS — Z9981 Dependence on supplemental oxygen: Secondary | ICD-10-CM | POA: Diagnosis not present

## 2022-01-14 DIAGNOSIS — Z7951 Long term (current) use of inhaled steroids: Secondary | ICD-10-CM | POA: Diagnosis not present

## 2022-01-14 DIAGNOSIS — J449 Chronic obstructive pulmonary disease, unspecified: Secondary | ICD-10-CM | POA: Diagnosis not present

## 2022-01-16 ENCOUNTER — Encounter (HOSPITAL_COMMUNITY): Payer: PPO

## 2022-01-21 ENCOUNTER — Encounter (HOSPITAL_COMMUNITY): Payer: PPO

## 2022-01-22 ENCOUNTER — Telehealth (HOSPITAL_COMMUNITY): Payer: Self-pay | Admitting: *Deleted

## 2022-01-22 ENCOUNTER — Ambulatory Visit: Payer: PPO | Admitting: Primary Care

## 2022-01-22 ENCOUNTER — Encounter: Payer: Self-pay | Admitting: Primary Care

## 2022-01-22 VITALS — BP 98/66 | HR 104 | Temp 97.9°F | Ht 64.0 in | Wt 169.6 lb

## 2022-01-22 DIAGNOSIS — R918 Other nonspecific abnormal finding of lung field: Secondary | ICD-10-CM | POA: Diagnosis not present

## 2022-01-22 DIAGNOSIS — J449 Chronic obstructive pulmonary disease, unspecified: Secondary | ICD-10-CM | POA: Diagnosis not present

## 2022-01-22 DIAGNOSIS — J9611 Chronic respiratory failure with hypoxia: Secondary | ICD-10-CM

## 2022-01-22 NOTE — Assessment & Plan Note (Signed)
-   S/p SBRT for empiric treatment RUL, most recent CT chest in July 2023 was favorable - Saw Dr. Sondra Come in July- plan fu in 6 months

## 2022-01-22 NOTE — Assessment & Plan Note (Addendum)
-   Continue supplemental oxygen 24/7 to maintain O2 88-90%/ 3L with POC and 2L cont @ home

## 2022-01-22 NOTE — Telephone Encounter (Signed)
Spoke to Brandy Morales regarding her ability to return to Pulmonary Rehab. She is feeling some better but does not feel like she can do rehab anytime soon. Pt asked to be discharged at this present time. She hopes to continue to recover and then return to program in the future. Encouraged Daquana to start with walking in her home. Yves Dill BS, ACSM-CEP 01/22/2022 9:43 AM

## 2022-01-22 NOTE — Patient Instructions (Addendum)
Recommendations: Continue Breztri 2 puffs morning and evening (rinse mouth after use) Continue prednisone 20mg  x 2 days, then taper 10mg  x 5 days, then stop Use incentive spirometer 5-10 deep breaths three times a day  Continue to wear 3L supplemental oxygen to maintain O2 88 to 90% or higher Okay to try mesh nebulizer Please notify us if breathing worsens once you come off prednisone   Refer: Pulmonary rehab re: chronic respiratory failure   Follow-up: Please schedule visit in October with Dr. Valeta Harms    COPD and Physical Activity Chronic obstructive pulmonary disease (COPD) is a long-term, or chronic, condition that affects the lungs. COPD is a general term that can be used to describe many problems that cause inflammation of the lungs and limit airflow. These conditions include chronic bronchitis and emphysema. The main symptom of COPD is shortness of breath, which makes it harder to do even simple tasks. This can also make it harder to exercise and stay active. Talk with your health care provider about treatments to help you breathe better and actions you can take to prevent breathing problems during physical activity. What are the benefits of exercising when you have COPD? Exercising regularly is an important part of a healthy lifestyle. You can still exercise and do physical activities even though you have COPD. Exercise and physical activity improve your shortness of breath by increasing blood flow (circulation). This causes your heart to pump more oxygen through your body. Moderate exercise can: Improve oxygen use. Increase your energy level. Help with shortness of breath. Strengthen your breathing muscles. Improve heart health. Help with sleep. Improve your self-esteem and feelings of self-worth. Lower depression, stress, and anxiety. Exercise can benefit everyone with COPD. The severity of your disease may affect how hard you can exercise, especially at first, but everyone can  benefit. Talk with your health care provider about how much exercise is safe for you, and which activities and exercises are safe for you. What actions can I take to prevent breathing problems during physical activity? Sign up for a pulmonary rehabilitation program. This type of program may include: Education about lung diseases. Exercise classes that teach you how to exercise and be more active while improving your breathing. This usually involves: Exercise using your lower extremities, such as a stationary bicycle. About 30 minutes of exercise, 2 to 5 times per week, for 6 to 12 weeks. Strength training, such as push-ups or leg lifts. Nutrition education. Group classes in which you can talk with others who also have COPD and learn ways to manage stress. If you use an oxygen tank, you should use it while you exercise. Work with your health care provider to adjust your oxygen for your physical activity. Your resting flow rate is different from your flow rate during physical activity. How to manage your breathing while exercising While you are exercising: Take slow breaths. Pace yourself, and do nottry to go too fast. Purse your lips while breathing out. Pursing your lips is similar to a kissing or whistling position. If doing exercise that uses a quick burst of effort, such as weight lifting: Breathe in before starting the exercise. Breathe out during the hardest part of the exercise, such as raising the weights. Where to find support You can find support for exercising with COPD from: Your health care provider. A pulmonary rehabilitation program. Your local health department or community health programs. Support groups, either online or in-person. Your health care provider may be able to recommend support groups. Where  to find more information You can find more information about exercising with COPD from: American Lung Association: lung.org COPD Foundation: copdfoundation.org Contact a  health care provider if: Your symptoms get worse. You have nausea. You have a fever. You want to start a new exercise program or a new activity. Get help right away if: You have chest pain. You cannot breathe. These symptoms may represent a serious problem that is an emergency. Do not wait to see if the symptoms will go away. Get medical help right away. Call your local emergency services (911 in the [place] Do not drive yourself to the hospital. Summary COPD is a general term that can be used to describe many different lung problems that cause lung inflammation and limit airflow. This includes chronic bronchitis and emphysema. Exercise and physical activity improve your shortness of breath by increasing blood flow (circulation). This causes your heart to provide more oxygen to your body. Contact your health care provider before starting any exercise program or new activity. Ask your health care provider what exercises and activities are safe for you. This information is not intended to replace advice given to you by your health care provider. Make sure you discuss any questions you have with your health care provider. Document Revised: 03/20/2020 Document Reviewed: 03/20/2020 Elsevier Patient Education  [place].

## 2022-01-22 NOTE — Assessment & Plan Note (Signed)
-   Very severe COPD, treated for acute exacerbation 2 weeks ago. Completed doxycycline course and is currently on second round of prednisone. She is feeling some better, exertion tends to worse her symptoms. Recommend she taper prednisone 10mg  x 5 days and then stop. If breathing worsen off steroids she may need to stay on daily low dose. Continue Breztri Aerosphere two puffs twice daily. Advised she use incentive spirometer three times a day to encourage deep breathing exercises. We are re-referring her to pulmonary rehab, strongly advise she take advantage of valet option. FU in 1 month with Dr. Valeta Harms.

## 2022-01-22 NOTE — Progress Notes (Signed)
@Patient  ID: Brandy Morales, female    DOB: [birth year], 76 y.o.   MRN: [id number]  Chief Complaint  Patient presents with   Follow-up    Referring provider: [name], MD  HPI: 76 year old female, former smoker.  Past medical history significant for COPD, chronic respiratory failure with hypoxia, pulmonary nodules.  Patient of Dr. Valeta Harms.  Previous [facility] pulmonary encounters This is a 76 year old female past medical history of severe COPD, chronic oxygen dependent respiratory failure on 2 L pulse POC, asthma, hypertension.  Patient had CT scan imaging which revealed a right upper lobe pulmonary nodule abutting the major fissure.  Patient underwent nuclear medicine pet imaging which revealed a PET avid right upper lobe pulmonary nodule SUV 5.4.  Patient was referred for evaluation of navigational bronchoscopy and tissue sampling.  Patient felt to be not a good candidate for surgical resection due to poor lung function and functional status.  This was discussed with Dr. Chase Caller who is patient's primary pulmonologist.   OV 04/10/2021: I originally saw the patient in June 2021 for nodule consultation.  She was set up for bronchoscopy however declined due to concern for general anesthesia and ultimately sought radiation oncology in August 2021 and completed 54 Gray, 3 fractions of empiric SBRT to this highly suspicious nodule.  No tissue diagnosis was obtained.  Overall from a respiratory standpoint she is doing well.  She is using her portable oxygen.  She does feel like she may have had some decline in her functional status after hospitalization.  But we have not seen her in greater than a year.  She has been trying to stay at home is much as possible.   10/30/2021 She is doing well. Breathing improved since starting [employer]. She uses her albuterol rescue inhaler on average once a week with improvement.  CAT score 25. She has of rare cough with phlegm production.  Occasional chest  tightness.  She experiences significant dyspnea when walking up inclines or 1 flight of stairs. She is limited doing activities at home.  Shortness of breath does recover quickly with rest.  She wears 2 L of oxygen at rest and 3 L with exertion.  She does not currently wear oxygen at bedtime because she feels she does not need it.  CT chest in January 2023  showed no significant change in posttreatment appearance of spiculated nodule posterior right upper lobe measuring 0.8 x 0.7 cm. Additional small nodules in right upper lobe are unchanged and likely benign. Follow-up with Dr. Sondra Come in July.   01/22/2022- Interim hx  Patient presents today for a follow-up.  She was recently seen by Dr. Lake Bells on 01/08/2022 and treated for acute exacerbation of her COPD with doxycycline and prednisone. She had a chest x-ray on 01/09/2022 that showed no active cardiopulmonary disease.  She had some linear markings to right upper lobe corresponding to scarring seen on prior CT, otherwise lungs were clear.  Today is a bad day for her. Her breathing is worse when she rushes, she does not do well with morning appointments. Yesterday she had a good day and was able to exercise for 11 straight minutes. She was not able to attend pulmonary rehab d.t parking issues. Encourage she use valet option at hospital prior to her appointment. She is using her oxygen consistently. No cough or mucus production. Her weight is stable. She is following with Dr. 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 She completed SBRT.  Most recent chest CT in July was favorable, no significant change in post treatment appearance of posterior RUL nodule measuring 0.7 x 0.7cm with some minimal adjacent ground-glass and bandlike scarring. CT also showed additional small RUL nodules are unchanged but an increase in conspicuity of adjacent, ill-defined ground-glass nodules of the peripheral LUL measuring  approximately 1.1 x 1.0 cm and 1.1 x 1.1 cm respectively.  Plan is to follow-up in 6 months.    Immunization History  Administered Date(s) Administered   Influenza Split 03/03/2016, 03/11/2017   Influenza, High Dose Seasonal PF 02/23/2019   Influenza-Unspecified 01/27/2014, 04/02/2015, 02/18/2016, 03/02/2017, 02/11/2019, 03/21/2021   PFIZER Comirnaty(Gray Top)Covid-19 Tri-Sucrose Vaccine 11/01/2020   PFIZER(Purple Top)SARS-COV-2 Vaccination 06/14/2019, 07/05/2019   Pneumococcal Conjugate-13 01/27/2014, 05/26/2019   Pneumococcal Polysaccharide-23 07/02/2008    Past Medical History:  Diagnosis Date   Anxiety    Asthma    Cancer ([place])    COPD (chronic obstructive pulmonary disease) ([place])    GERD (gastroesophageal reflux disease)    History of kidney stones    History of radiation therapy 01/05/2020   IMRT right lung 54 Gy in 3 Fractions 12/29/2019 through 01/05/2020  Dr Gery Pray   Hypertension     Tobacco History: Social History   Tobacco Use  Smoking Status Former   Packs/day: 2.00   Years: 55.00   Total pack years: 110.00   Types: Cigarettes   Quit date: 2016   Years since quitting: 7.6  Smokeless Tobacco Never   Counseling given: Not Answered   Outpatient Medications Prior to Visit  Medication Sig Dispense Refill   albuterol (ACCUNEB) 1.25 MG/3ML nebulizer solution Take 1 ampule by nebulization every 6 (six) hours as needed for wheezing.     albuterol (VENTOLIN HFA) 108 (90 Base) MCG/ACT inhaler Inhale 2 puffs into the lungs every 6 (six) hours as needed for wheezing or shortness of breath.     Artificial Tear Solution (GENTEAL TEARS OP) Place 1 drop into both eyes daily.      atorvastatin (LIPITOR) 10 MG tablet Take 5 mg by mouth 3 (three) times a week. Take on MWF     Budeson-Glycopyrrol-Formoterol (BREZTRI AEROSPHERE) 160-9-4.8 MCG/ACT AERO Inhale 2 puffs into the lungs in the morning and at bedtime. 10.7 g 5   cetirizine (ZYRTEC) 10 MG tablet Take 10 mg by mouth  daily.     Cholecalciferol (VITAMIN D3) 5000 units CAPS Take 5,000 Units by mouth daily.     citalopram (CELEXA) 20 MG tablet Take 20 mg by mouth daily.     ezetimibe (ZETIA) 10 MG tablet Take 10 mg by mouth at bedtime.     fluticasone (FLONASE) 50 MCG/ACT nasal spray Place 1 spray into both nostrils daily.      losartan-hydrochlorothiazide (HYZAAR) 100-25 MG tablet Take 0.5 tablets by mouth daily.     Multiple Vitamin (MULTIVITAMIN) tablet Take 1 tablet by mouth daily.     omeprazole (PRILOSEC) 20 MG capsule Take 20 mg by mouth daily.      OXYGEN Inhale 2 L into the lungs continuous.      predniSONE (DELTASONE) 10 MG tablet Take 2 tablets (20 mg total) by mouth daily with breakfast for 10 days. 20 tablet 0   No facility-administered medications prior to visit.   Review of Systems  Review of Systems  Constitutional: Negative.   HENT:  Positive for postnasal drip.   Respiratory:  Positive for shortness of breath. Negative for cough, chest tightness and wheezing.     Physical  Exam  BP 98/66 (BP Location: Left Arm, Patient Position: Sitting, Cuff Size: Normal)   Pulse (!) 104 Comment: Rechrck pulse at the end of visit because pulse was 117 at te beginning of appointment.  Temp 97.9 F (36.6 C) (Oral)   Ht 5\' 4"  (1.626 m)   Wt 169 lb 9.6 oz (76.9 kg)   SpO2 95%   BMI 29.11 kg/m  Physical Exam Constitutional:      General: She is not in acute distress.    Appearance: Normal appearance. She is not ill-appearing.  HENT:     Head: Normocephalic and atraumatic.     Right Ear: Tympanic membrane normal. There is no impacted cerumen.     Left Ear: Tympanic membrane normal. There is no impacted cerumen.     Mouth/Throat:     Mouth: Mucous membranes are moist.     Pharynx: Oropharynx is clear.  Cardiovascular:     Rate and Rhythm: Normal rate.  Pulmonary:     Effort: Pulmonary effort is normal.     Breath sounds: Wheezing present. No rhonchi or rales.  Musculoskeletal:     Cervical  back: Normal range of motion and neck supple.  Skin:    General: Skin is warm and dry.  Neurological:     General: No focal deficit present.     Mental Status: She is alert and oriented to person, place, and time. Mental status is at baseline.  Psychiatric:        Mood and Affect: Mood normal.        Behavior: Behavior normal.        Thought Content: Thought content normal.        Judgment: Judgment normal.      Lab Results:  CBC    Component Value Date/Time   WBC 8.5 01/02/2021 0503   RBC 4.40 01/02/2021 0503   RBC 4.32 01/02/2021 0503   HGB 12.3 01/02/2021 0503   HCT 39.5 01/02/2021 0503   PLT 145 (L) 01/02/2021 0503   MCV 91.4 01/02/2021 0503   MCH 28.5 01/02/2021 0503   MCHC 31.1 01/02/2021 0503   RDW 14.3 01/02/2021 0503   LYMPHSABS 0.5 (L) 12/29/2020 0657   MONOABS 0.8 12/29/2020 0657   EOSABS 0.0 12/29/2020 0657   BASOSABS 0.0 12/29/2020 0657    BMET    Component Value Date/Time   NA 147 (H) 01/02/2021 0503   K 4.3 01/02/2021 0503   CL 111 01/02/2021 0503   CO2 28 01/02/2021 0503   GLUCOSE 103 (H) 01/02/2021 0503   BUN 36 (H) 01/02/2021 0503   CREATININE 1.07 (H) 01/02/2021 0503   CALCIUM 9.3 01/02/2021 0503   GFRNONAA 55 (L) 01/02/2021 0503   GFRAA >60 11/25/2017 1051    BNP    Component Value Date/Time   BNP 185.2 (H) 01/02/2021 0503    ProBNP No results found for: "PROBNP"  Imaging: DG Chest 2 View  Result Date: 01/09/2022 CLINICAL DATA:  Dyspnea. EXAM: CHEST - 2 VIEW COMPARISON:  Chest x-ray 04/27/2009.  CT of the chest 12/06/2021. FINDINGS: There is some linear markings in the right upper lobe corresponding to scarring seen on prior CT. The lungs are otherwise clear. There is no pleural effusion or pneumothorax. Cardiomediastinal silhouette is within normal limits. Small sclerotic density in the anterior left fifth rib is unchanged. No acute fractures are seen. IMPRESSION: No active cardiopulmonary disease.  No significant interval change.  Electronically Signed   By: Ronney Asters M.D.   On:  01/09/2022 22:11     Assessment & Plan:   Chronic obstructive pulmonary disease ([place]) - Very severe COPD, treated for acute exacerbation 2 weeks ago. Completed doxycycline course and is currently on second round of prednisone. She is feeling some better, exertion tends to worse her symptoms. Recommend she taper prednisone 10mg  x 5 days and then stop. If breathing worsen off steroids she may need to stay on daily low dose. Continue Breztri Aerosphere two puffs twice daily. Advised she use incentive spirometer three times a day to encourage deep breathing exercises. We are re-referring her to pulmonary rehab, strongly advise she take advantage of valet option. FU in 1 month with Dr. Valeta Harms.   Chronic respiratory failure with hypoxia ([facility]) - Continue supplemental oxygen 24/7 to maintain O2 88-90%/ 3L with POC and 2L cont @ home  Pulmonary nodules - S/p SBRT for empiric treatment RUL, most recent CT chest in July 2023 was favorable - Saw Dr. Sondra Come in July- plan fu in 6 months    Martyn Ehrich, NP 01/22/2022

## 2022-01-23 ENCOUNTER — Telehealth (HOSPITAL_COMMUNITY): Payer: Self-pay | Admitting: *Deleted

## 2022-01-23 ENCOUNTER — Encounter (HOSPITAL_COMMUNITY): Payer: PPO

## 2022-01-23 ENCOUNTER — Telehealth: Payer: Self-pay | Admitting: Primary Care

## 2022-01-23 MED ORDER — PREDNISONE 10 MG PO TABS: ORAL_TABLET | ORAL | 0 refills | Status: DC

## 2022-01-23 NOTE — Telephone Encounter (Signed)
Patient is calling again to request a refill for her Prednisone medication which she said she discussed with the doctor yesterday at her appt.  She said the pharmacy still does not have it and she needs it by tomorrow.  Please advise and send request to [facility] and confirm with patient that it has been sent.  CB# [phone]

## 2022-01-23 NOTE — Telephone Encounter (Signed)
Noted Addalyn was referred to [facility] again. Called [facility] and she is wanting to start back 9/7 for exercise. Encouraged her to walk in her house until then.  Yves Dill BS, ACSM-CEP 01/23/2022 7:43 AM

## 2022-01-23 NOTE — Telephone Encounter (Signed)
Called and spoke to pt. Pt states she needs additional prednisone to continue the taper advised by Beth at 8/30 OV. OV recs per [facility] are below in bold. New pred rx sent to pt's preferred pharmacy. Pt verbalized understanding and denied any further questions or concerns at this time.      Recommendations: Continue Breztri 2 puffs morning and evening (rinse mouth after use) Continue prednisone 20mg  x 2 days, then taper 10mg  x 5 days, then stop Use incentive spirometer 5-10 deep breaths three times a day  Continue to wear 3L supplemental oxygen to maintain O2 88 to 90% or higher Okay to try mesh nebulizer Please notify us if breathing worsens once you come off prednisone

## 2022-01-28 ENCOUNTER — Encounter (HOSPITAL_COMMUNITY): Payer: PPO

## 2022-01-28 NOTE — Addendum Note (Signed)
Encounter addended by: Deon Pilling, RN on: 01/28/2022 10:00 AM  Actions taken: Flowsheet accepted

## 2022-01-29 NOTE — Addendum Note (Signed)
Encounter addended by: Sheppard Plumber on: 01/29/2022 11:00 AM  Actions taken: Clinical Note Signed

## 2022-01-29 NOTE — Progress Notes (Signed)
Pulmonary Individual Treatment Plan  Patient Details  Name: Brandy Morales MRN: [id number] Date of Birth: 07/10/1945 Referring Provider:   [name] Pulmonary Rehab Walk Test from 12/27/2021 in [facility]  Referring Provider [name]       Initial Encounter Date:  Flowsheet Row Pulmonary Rehab Walk Test from 12/27/2021 in [facility]  Date 12/27/21       Visit Diagnosis: Chronic respiratory failure with hypoxia ([place])  Patient's Home Medications on Admission:   Current Outpatient Medications:    albuterol (ACCUNEB) 1.25 MG/3ML nebulizer solution, Take 1 ampule by nebulization every 6 (six) hours as needed for wheezing., Disp: , Rfl:    albuterol (VENTOLIN HFA) 108 (90 Base) MCG/ACT inhaler, Inhale 2 puffs into the lungs every 6 (six) hours as needed for wheezing or shortness of breath., Disp: , Rfl:    Artificial Tear Solution (GENTEAL TEARS OP), Place 1 drop into both eyes daily. , Disp: , Rfl:    atorvastatin (LIPITOR) 10 MG tablet, Take 5 mg by mouth 3 (three) times a week. Take on MWF, Disp: , Rfl:    Budeson-Glycopyrrol-Formoterol (BREZTRI AEROSPHERE) 160-9-4.8 MCG/ACT AERO, Inhale 2 puffs into the lungs in the morning and at bedtime., Disp: 10.7 g, Rfl: 5   cetirizine (ZYRTEC) 10 MG tablet, Take 10 mg by mouth daily., Disp: , Rfl:    Cholecalciferol (VITAMIN D3) 5000 units CAPS, Take 5,000 Units by mouth daily., Disp: , Rfl:    citalopram (CELEXA) 20 MG tablet, Take 20 mg by mouth daily., Disp: , Rfl:    ezetimibe (ZETIA) 10 MG tablet, Take 10 mg by mouth at bedtime., Disp: , Rfl:    fluticasone (FLONASE) 50 MCG/ACT nasal spray, Place 1 spray into both nostrils daily. , Disp: , Rfl:    losartan-hydrochlorothiazide (HYZAAR) 100-25 MG tablet, Take 0.5 tablets by mouth daily., Disp: , Rfl:    Multiple Vitamin (MULTIVITAMIN) tablet, Take 1 tablet by mouth daily., Disp: , Rfl:    omeprazole (PRILOSEC) 20 MG capsule, Take  20 mg by mouth daily. , Disp: , Rfl:    OXYGEN, Inhale 2 L into the lungs continuous. , Disp: , Rfl:    predniSONE (DELTASONE) 10 MG tablet, Take $RemoveBefo'20mg'RLRoZgZMvWv$  (2 tabs) for 2 days, then take $RemoveB'10mg'SLwXiMaA$  (1 tab) for 5 days, then stop., Disp: 9 tablet, Rfl: 0  Past Medical History: Past Medical History:  Diagnosis Date   Anxiety    Asthma    Cancer ([place])    COPD (chronic obstructive pulmonary disease) ([place])    GERD (gastroesophageal reflux disease)    History of kidney stones    History of radiation therapy 01/05/2020   IMRT right lung 54 Gy in 3 Fractions 12/29/2019 through 01/05/2020  Dr Gery Pray   Hypertension     Tobacco Use: Social History   Tobacco Use  Smoking Status Former   Packs/day: 2.00   Years: 55.00   Total pack years: 110.00   Types: Cigarettes   Quit date: 2016   Years since quitting: 7.6  Smokeless Tobacco Never    Labs: Review Flowsheet       Latest Ref Rng & Units 12/28/2020  Labs for ITP Cardiac and Pulmonary Rehab  TCO2 22 - 32 mmol/L 26     Capillary Blood Glucose: Lab Results  Component Value Date   GLUCAP 81 10/12/2019   GLUCAP 91 05/11/2017     Pulmonary Assessment Scores:  Pulmonary Assessment Scores     Row  Name 12/27/21 1448         ADL UCSD   ADL Phase Entry     SOB Score total 102       CAT Score   CAT Score 30       mMRC Score   mMRC Score 4             UCSD: Self-administered rating of dyspnea associated with activities of daily living (ADLs) 6-point scale (0 = "not at all" to 5 = "maximal or unable to do because of breathlessness")  Scoring Scores range from 0 to 120.  Minimally important difference is 5 units  CAT: CAT can identify the health impairment of COPD patients and is better correlated with disease progression.  CAT has a scoring range of zero to 40. The CAT score is classified into four groups of low (less than 10), medium (10 - 20), high (21-30) and very high (31-40) based on the impact level of disease on health  status. A CAT score over 10 suggests significant symptoms.  A worsening CAT score could be explained by an exacerbation, poor medication adherence, poor inhaler technique, or progression of COPD or comorbid conditions.  CAT MCID is 2 points  mMRC: mMRC (Modified Medical Research Council) Dyspnea Scale is used to assess the degree of baseline functional disability in patients of respiratory disease due to dyspnea. No minimal important difference is established. A decrease in score of 1 point or greater is considered a positive change.   Pulmonary Function Assessment:  Pulmonary Function Assessment - 12/27/21 1445       Breath   Bilateral Breath Sounds Decreased    Shortness of Breath Yes;Fear of Shortness of Breath;Limiting activity             Exercise Target Goals: Exercise Program Goal: Individual exercise prescription set using results from initial 6 min walk test and THRR while considering  patient's activity barriers and safety.   Exercise Prescription Goal: Initial exercise prescription builds to 30-45 minutes a day of aerobic activity, 2-3 days per week.  Home exercise guidelines will be given to patient during program as part of exercise prescription that the participant will acknowledge.  Activity Barriers & Risk Stratification:  Activity Barriers & Cardiac Risk Stratification - 12/27/21 1438       Activity Barriers & Cardiac Risk Stratification   Activity Barriers Deconditioning;Muscular Weakness;Shortness of Breath;History of Falls;Balance Concerns    Cardiac Risk Stratification Low             6 Minute Walk:  6 Minute Walk     Row Name 12/27/21 1543         6 Minute Walk   Phase Initial     Distance 790 feet     Walk Time 6 minutes     # of Rest Breaks 2     MPH 1.5     METS 1.75     RPE 13     Perceived Dyspnea  3     VO2 Peak 6.12     Symptoms Yes (comment)     Comments SOB and fatigue     Resting HR 107 bpm     Resting BP 105/70      Resting Oxygen Saturation  93 %     Exercise Oxygen Saturation  during 6 min walk 88 %     Max Ex. HR 117 bpm     Max Ex. BP 131/84     2 Minute Post BP  112/81       Interval HR   1 Minute HR 107     2 Minute HR 113     3 Minute HR 115     4 Minute HR 111     5 Minute HR 117     6 Minute HR 116     2 Minute Post HR 100     Interval Heart Rate? Yes       Interval Oxygen   Interval Oxygen? Yes     Baseline Oxygen Saturation % 93 %     1 Minute Oxygen Saturation % 93 %     1 Minute Liters of Oxygen 3 L     2 Minute Oxygen Saturation % 89 %     2 Minute Liters of Oxygen 3 L     3 Minute Oxygen Saturation % 89 %     3 Minute Liters of Oxygen 3 L     4 Minute Oxygen Saturation % 89 %     4 Minute Liters of Oxygen 3 L     5 Minute Oxygen Saturation % 88 %     5 Minute Liters of Oxygen 3 L     6 Minute Oxygen Saturation % 89 %     6 Minute Liters of Oxygen 3 L     2 Minute Post Oxygen Saturation % 96 %     2 Minute Post Liters of Oxygen 3 L              Oxygen Initial Assessment:  Oxygen Initial Assessment - 12/27/21 1436       Home Oxygen   Home Oxygen Device Home Concentrator    Sleep Oxygen Prescription Continuous    Liters per minute 2    Home Exercise Oxygen Prescription Pulsed    Liters per minute 3    Home Resting Oxygen Prescription Continuous    Liters per minute 2    Compliance with Home Oxygen Use Yes      Initial 6 min Walk   Oxygen Used Continuous    Liters per minute 3      Program Oxygen Prescription   Program Oxygen Prescription Continuous    Liters per minute 3    Comments used 3L during 6 MWT with 2 breaks for fatigue/SOB and SaO2 88      Intervention   Short Term Goals To learn and exhibit compliance with exercise, home and travel O2 prescription;To learn and understand importance of monitoring SPO2 with pulse oximeter and demonstrate accurate use of the pulse oximeter.;To learn and understand importance of maintaining oxygen  saturations>88%;To learn and demonstrate proper pursed lip breathing techniques or other breathing techniques. ;To learn and demonstrate proper use of respiratory medications    Long  Term Goals Exhibits compliance with exercise, home  and travel O2 prescription;Verbalizes importance of monitoring SPO2 with pulse oximeter and return demonstration;Maintenance of O2 saturations>88%;Exhibits proper breathing techniques, such as pursed lip breathing or other method taught during program session;Compliance with respiratory medication;Demonstrates proper use of MDI's             Oxygen Re-Evaluation:  Oxygen Re-Evaluation     Row Name 01/01/22 0729             Program Oxygen Prescription   Program Oxygen Prescription Continuous       Liters per minute 3       Comments used 3L during 6 MWT with 2 breaks for fatigue/SOB and SaO2 88  Home Oxygen   Home Oxygen Device Home Concentrator       Sleep Oxygen Prescription Continuous       Liters per minute 2       Home Exercise Oxygen Prescription Pulsed       Liters per minute 3       Home Resting Oxygen Prescription Continuous       Liters per minute 2       Compliance with Home Oxygen Use Yes         Goals/Expected Outcomes   Short Term Goals To learn and exhibit compliance with exercise, home and travel O2 prescription;To learn and understand importance of monitoring SPO2 with pulse oximeter and demonstrate accurate use of the pulse oximeter.;To learn and understand importance of maintaining oxygen saturations>88%;To learn and demonstrate proper pursed lip breathing techniques or other breathing techniques. ;To learn and demonstrate proper use of respiratory medications       Long  Term Goals Exhibits compliance with exercise, home  and travel O2 prescription;Verbalizes importance of monitoring SPO2 with pulse oximeter and return demonstration;Maintenance of O2 saturations>88%;Exhibits proper breathing techniques, such as pursed lip  breathing or other method taught during program session;Compliance with respiratory medication;Demonstrates proper use of MDI's       Goals/Expected Outcomes Compliance and understanding of oxygen saturation monitoring and breathing techniques to decrease shortness of breath.                Oxygen Discharge (Final Oxygen Re-Evaluation):  Oxygen Re-Evaluation - 01/01/22 0729       Program Oxygen Prescription   Program Oxygen Prescription Continuous    Liters per minute 3    Comments used 3L during 6 MWT with 2 breaks for fatigue/SOB and SaO2 88      Home Oxygen   Home Oxygen Device Home Concentrator    Sleep Oxygen Prescription Continuous    Liters per minute 2    Home Exercise Oxygen Prescription Pulsed    Liters per minute 3    Home Resting Oxygen Prescription Continuous    Liters per minute 2    Compliance with Home Oxygen Use Yes      Goals/Expected Outcomes   Short Term Goals To learn and exhibit compliance with exercise, home and travel O2 prescription;To learn and understand importance of monitoring SPO2 with pulse oximeter and demonstrate accurate use of the pulse oximeter.;To learn and understand importance of maintaining oxygen saturations>88%;To learn and demonstrate proper pursed lip breathing techniques or other breathing techniques. ;To learn and demonstrate proper use of respiratory medications    Long  Term Goals Exhibits compliance with exercise, home  and travel O2 prescription;Verbalizes importance of monitoring SPO2 with pulse oximeter and return demonstration;Maintenance of O2 saturations>88%;Exhibits proper breathing techniques, such as pursed lip breathing or other method taught during program session;Compliance with respiratory medication;Demonstrates proper use of MDI's    Goals/Expected Outcomes Compliance and understanding of oxygen saturation monitoring and breathing techniques to decrease shortness of breath.             Initial Exercise  Prescription:  Initial Exercise Prescription - 12/27/21 1500       Date of Initial Exercise RX and Referring Provider   Date 12/27/21    Referring Provider [name]    Expected Discharge Date 03/06/22      Oxygen   Oxygen Continuous    Liters 3    Maintain Oxygen Saturation 88% or higher      Recumbant Bike   Level 1  RPM 70    Watts 1    Minutes 15      Track   Minutes 15    METs 1.75      Prescription Details   Frequency (times per week) 2    Duration Progress to 30 minutes of continuous aerobic without signs/symptoms of physical distress      Intensity   THRR 40-80% of Max Heartrate 58-116    Ratings of Perceived Exertion 11-13    Perceived Dyspnea 0-4      Progression   Progression Continue to progress workloads to maintain intensity without signs/symptoms of physical distress.      Resistance Training   Training Prescription Yes    Weight red bands    Reps 10-15             Perform Capillary Blood Glucose checks as needed.  Exercise Prescription Changes:   Exercise Prescription Changes     Row Name 01/02/22 1613 01/14/22 1600 01/23/22 0700         Response to Exercise   Blood Pressure (Admit) 106/50 -- --     Blood Pressure (Exercise) 90/50 -- --     Blood Pressure (Exit) 105/64 -- --     Heart Rate (Admit) 102 bpm -- --     Heart Rate (Exercise) 124 bpm -- --     Heart Rate (Exit) 109 bpm -- --     Oxygen Saturation (Admit) 98 % -- --     Oxygen Saturation (Exercise) 95 % -- --     Oxygen Saturation (Exit) 98 % -- --     Rating of Perceived Exertion (Exercise) 13 -- --     Perceived Dyspnea (Exercise) 3 -- --     Duration Continue with 30 min of aerobic exercise without signs/symptoms of physical distress. -- --     Intensity --  40-80% HRR --  40-80% HRR --       Resistance Training   Training Prescription Yes -- --     Weight red bands -- --     Reps 10-15 -- --     Time 10 Minutes -- --       Oxygen   Oxygen Continuous -- --      Liters 3 -- --       Recumbant Bike   Level 1 -- 1     RPM 70 -- 60     Minutes 15 -- 5     METs 1.9 -- --       NuStep   Level -- -- 1     SPM -- -- 60     METs -- -- 15       Track   Laps 6 -- --     Minutes 15 -- 5     METs 1.7 -- --              Exercise Comments:   Exercise Comments     Row Name 01/02/22 1549           Exercise Comments Mitzie completed her first day of exericse. She has participated in Pulmonary Rehab before. She exercised for 15 min on the track and recumbent bike. Lamiah tolerated exercise fair. Will encourage more rest breaks next session and reconsider ExRx. Maraki performed the warmup and cooldown standing without limitations.                Exercise Goals and Review:   Exercise Goals  [place] Name 12/27/21 1548 12/31/21 0746           Exercise Goals   Increase Physical Activity Yes Yes      Intervention Provide advice, education, support and counseling about physical activity/exercise needs.;Develop an individualized exercise prescription for aerobic and resistive training based on initial evaluation findings, risk stratification, comorbidities and participant's personal goals. Provide advice, education, support and counseling about physical activity/exercise needs.;Develop an individualized exercise prescription for aerobic and resistive training based on initial evaluation findings, risk stratification, comorbidities and participant's personal goals.      Expected Outcomes Short Term: Attend rehab on a regular basis to increase amount of physical activity.;Long Term: Add in home exercise to make exercise part of routine and to increase amount of physical activity.;Long Term: Exercising regularly at least 3-5 days a week. Short Term: Attend rehab on a regular basis to increase amount of physical activity.;Long Term: Add in home exercise to make exercise part of routine and to increase amount of physical activity.;Long Term: Exercising  regularly at least 3-5 days a week.      Increase Strength and Stamina Yes Yes      Intervention Provide advice, education, support and counseling about physical activity/exercise needs.;Develop an individualized exercise prescription for aerobic and resistive training based on initial evaluation findings, risk stratification, comorbidities and participant's personal goals. Provide advice, education, support and counseling about physical activity/exercise needs.;Develop an individualized exercise prescription for aerobic and resistive training based on initial evaluation findings, risk stratification, comorbidities and participant's personal goals.      Expected Outcomes Short Term: Increase workloads from initial exercise prescription for resistance, speed, and METs.;Short Term: Perform resistance training exercises routinely during rehab and add in resistance training at home;Long Term: Improve cardiorespiratory fitness, muscular endurance and strength as measured by increased METs and functional capacity (6MWT) Short Term: Increase workloads from initial exercise prescription for resistance, speed, and METs.;Short Term: Perform resistance training exercises routinely during rehab and add in resistance training at home;Long Term: Improve cardiorespiratory fitness, muscular endurance and strength as measured by increased METs and functional capacity (6MWT)      Able to understand and use rate of perceived exertion (RPE) scale Yes Yes      Intervention Provide education and explanation on how to use RPE scale Provide education and explanation on how to use RPE scale      Expected Outcomes Short Term: Able to use RPE daily in rehab to express subjective intensity level;Long Term:  Able to use RPE to guide intensity level when exercising independently Short Term: Able to use RPE daily in rehab to express subjective intensity level;Long Term:  Able to use RPE to guide intensity level when exercising independently       Able to understand and use Dyspnea scale Yes Yes      Intervention Provide education and explanation on how to use Dyspnea scale Provide education and explanation on how to use Dyspnea scale      Expected Outcomes Short Term: Able to use Dyspnea scale daily in rehab to express subjective sense of shortness of breath during exertion;Long Term: Able to use Dyspnea scale to guide intensity level when exercising independently Short Term: Able to use Dyspnea scale daily in rehab to express subjective sense of shortness of breath during exertion;Long Term: Able to use Dyspnea scale to guide intensity level when exercising independently      Knowledge and understanding of Target Heart Rate Range (THRR) Yes Yes      Intervention  Provide education and explanation of THRR including how the numbers were predicted and where they are located for reference Provide education and explanation of THRR including how the numbers were predicted and where they are located for reference      Expected Outcomes Short Term: Able to state/look up THRR;Long Term: Able to use THRR to govern intensity when exercising independently;Short Term: Able to use daily as guideline for intensity in rehab Short Term: Able to state/look up THRR;Long Term: Able to use THRR to govern intensity when exercising independently;Short Term: Able to use daily as guideline for intensity in rehab      Understanding of Exercise Prescription Yes Yes      Intervention Provide education, explanation, and written materials on patient's individual exercise prescription Provide education, explanation, and written materials on patient's individual exercise prescription      Expected Outcomes Short Term: Able to explain program exercise prescription;Long Term: Able to explain home exercise prescription to exercise independently Short Term: Able to explain program exercise prescription;Long Term: Able to explain home exercise prescription to exercise  independently               Exercise Goals Re-Evaluation :  Exercise Goals Re-Evaluation     Row Name 12/31/21 0746             Exercise Goal Re-Evaluation   Exercise Goals Review Increase Physical Activity;Increase Strength and Stamina;Able to understand and use rate of perceived exertion (RPE) scale;Able to understand and use Dyspnea scale;Knowledge and understanding of Target Heart Rate Range (THRR);Understanding of Exercise Prescription       Comments Rabab ks scheduled to begin exercise next week. Will continue to monitor and progress as able.       Expected Outcomes Through exercise at rehab and home, the patient will decrease shortness of breath with daily activities and feel confident in carrying out an exercise regimn at home.                Discharge Exercise Prescription (Final Exercise Prescription Changes):  Exercise Prescription Changes - 01/23/22 0700       Recumbant Bike   Level 1    RPM 60    Minutes 5      NuStep   Level 1    SPM 60    METs 15      Track   Minutes 5             Nutrition:  Target Goals: Understanding of nutrition guidelines, daily intake of sodium '1500mg'$ , cholesterol '200mg'$ , calories 30% from fat and 7% or less from saturated fats, daily to have 5 or more servings of fruits and vegetables.  Biometrics:  Pre Biometrics - 12/27/21 1548       Pre Biometrics   Grip Strength 20 kg              Nutrition Therapy Plan and Nutrition Goals:  Nutrition Therapy & Goals - 01/02/22 1511       Nutrition Therapy   Diet Heart Healthy Diet    Drug/Food Interactions Statins/Certain Fruits      Personal Nutrition Goals   Nutrition Goal Patient to choose a daily variety of fruits, vegetables, whole grains, lean protein/plant protein, nonfat/lowfat dairy as part of balanced lifestyle    Personal Goal #2 Patient to limit to '1500mg'$  of sodium daily    Personal Goal #3 Patient to identify and limit food sources of saturated  fat, trans fat, refined carbohydrates, and sodium.  Comments Patient is getting groceries delivers and lives alone. She has medical history of hypercholesterolemia, HTN, and atherosclerosis of aorta.      Intervention Plan   Intervention Prescribe, educate and counsel regarding individualized specific dietary modifications aiming towards targeted core components such as weight, hypertension, lipid management, diabetes, heart failure and other comorbidities.    Expected Outcomes Short Term Goal: Understand basic principles of dietary content, such as calories, fat, sodium, cholesterol and nutrients.;Long Term Goal: Adherence to prescribed nutrition plan.             Nutrition Assessments:  MEDIFICTS Score Key: ?70 Need to make dietary changes  40-70 Heart Healthy Diet ? 40 Therapeutic Level Cholesterol Diet  Flowsheet Row PULMONARY REHAB OTHER RESPIRATORY from 11/29/2020 in [facility]  Picture Your Plate Total Score on Discharge 60      Picture Your Plate Scores: <79 Unhealthy dietary pattern with much room for improvement. 41-50 Dietary pattern unlikely to meet recommendations for good health and room for improvement. 51-60 More healthful dietary pattern, with some room for improvement.  >60 Healthy dietary pattern, although there may be some specific behaviors that could be improved.    Nutrition Goals Re-Evaluation:  Nutrition Goals Re-Evaluation     [place] Name 01/02/22 1511             Goals   Current Weight 171 lb 4.8 oz (77.7 kg)       Comment She has previously completed pulmonary rehab ~1 year ago. Weight stable from 1 year ago.       Expected Outcome Patient is getting groceries delivered and lives alone. She has medical history of hypercholesterolemia, HTN, and atherosclerosis of aorta. Expect improvement with heart healthy dietary changes.                Nutrition Goals Discharge (Final Nutrition Goals Re-Evaluation):   Nutrition Goals Re-Evaluation - 01/02/22 1511       Goals   Current Weight 171 lb 4.8 oz (77.7 kg)    Comment She has previously completed pulmonary rehab ~1 year ago. Weight stable from 1 year ago.    Expected Outcome Patient is getting groceries delivered and lives alone. She has medical history of hypercholesterolemia, HTN, and atherosclerosis of aorta. Expect improvement with heart healthy dietary changes.             Psychosocial: Target Goals: Acknowledge presence or absence of significant depression and/or stress, maximize coping skills, provide positive support system. Participant is able to verbalize types and ability to use techniques and skills needed for reducing stress and depression.  Initial Review & Psychosocial Screening:  Initial Psych Review & Screening - 12/27/21 1428       Initial Review   Current issues with None Identified      Family Dynamics   Good Support System? Yes    Comments step daughter Erasmo Downer and her husband, friends      Barriers   Psychosocial barriers to participate in program The patient should benefit from training in stress management and relaxation.      Screening Interventions   Interventions Encouraged to exercise;To provide support and resources with identified psychosocial needs    Expected Outcomes Short Term goal: Utilizing psychosocial counselor, staff and physician to assist with identification of specific Stressors or current issues interfering with healing process. Setting desired goal for each stressor or current issue identified.;Long Term Goal: Stressors or current issues are controlled or eliminated.;Short Term goal: Identification and review  with participant of any Quality of Life or Depression concerns found by scoring the questionnaire.;Long Term goal: The participant improves quality of Life and PHQ9 Scores as seen by post scores and/or verbalization of changes             Quality of Life Scores:  Scores of 19 and  below usually indicate a poorer quality of life in these areas.  A difference of  2-3 points is a clinically meaningful difference.  A difference of 2-3 points in the total score of the Quality of Life Index has been associated with significant improvement in overall quality of life, self-image, physical symptoms, and general health in studies assessing change in quality of life.  PHQ-9: Review Flowsheet       12/27/2021 11/29/2020 09/24/2020  Depression screen PHQ 2/9  Decreased Interest 0 0 0 0 0  Down, Depressed, Hopeless 1 1 0 0 0  PHQ - 2 Score 1 1 0 0 0  Altered sleeping 1 0 2  Tired, decreased energy 0 2 1  Change in appetite 0 0 0  Feeling bad or failure about yourself  0 0 0  Trouble concentrating 0 0 0  Moving slowly or fidgety/restless 0 0 0  Suicidal thoughts 0 0 0  PHQ-9 Score 2 2 -  Difficult doing work/chores Not difficult at all Not difficult at all Not difficult at all   Interpretation of Total Score  Total Score Depression Severity:  1-4 = Minimal depression, 5-9 = Mild depression, 10-14 = Moderate depression, 15-19 = Moderately severe depression, 20-27 = Severe depression   Psychosocial Evaluation and Intervention:  Psychosocial Evaluation - 12/27/21 1431       Psychosocial Evaluation & Interventions   Interventions Stress management education;Relaxation education;Encouraged to exercise with the program and follow exercise prescription    Comments --    Expected Outcomes Allesha to participate in pulmonary rehab    Continue Psychosocial Services  Follow up required by staff             Psychosocial Re-Evaluation:  Psychosocial Re-Evaluation     Row Name 01/01/22 0920 01/28/22 3888           Psychosocial Re-Evaluation   Current issues with None Identified None Identified      Comments Ayaka has attended the PR orientation session 12/27/21 and is scheduled to start the program 01/02/22. Will continue to monitor for any psychosocial barriers. Braden has only  attended one exercise session thus far. At that session she was very SOB and totally exhausted by the end of class. We recommended for her to call her MD. Since that time she has had been on 2 rounds of steroids and taken antibiotics. She was going to discharge from the program but later decided that she would return. She plans to return 9/7.      Expected Outcomes For her to participate in the PR program without any psychosocial issues or concerns. We will continue to monitor her psychosocial barriers.      Interventions Relaxation education;Stress management education Encouraged to attend Pulmonary Rehabilitation for the exercise      Continue Psychosocial Services  Follow up required by staff No Follow up required               Psychosocial Discharge (Final Psychosocial Re-Evaluation):  Psychosocial Re-Evaluation - 01/28/22 0938       Psychosocial Re-Evaluation   Current issues with None Identified    Comments Unika has only attended one exercise session  thus far. At that session she was very SOB and totally exhausted by the end of class. We recommended for her to call her MD. Since that time she has had been on 2 rounds of steroids and taken antibiotics. She was going to discharge from the program but later decided that she would return. She plans to return 9/7.    Expected Outcomes We will continue to monitor her psychosocial barriers.    Interventions Encouraged to attend Pulmonary Rehabilitation for the exercise    Continue Psychosocial Services  No Follow up required             Education: Education Goals: Education classes will be provided on a weekly basis, covering required topics. Participant will state understanding/return demonstration of topics presented.  Learning Barriers/Preferences:  Learning Barriers/Preferences - 12/27/21 1432       Learning Barriers/Preferences   Learning Preferences Audio;Group Instruction;Individual Instruction;Verbal Instruction              Education Topics: Introduction to Pulmonary Rehab Group instruction provided by PowerPoint, verbal discussion, and written material to support subject matter. Instructor reviews what Pulmonary Rehab is, the purpose of the program, and how patients are referred.     Know Your Numbers Group instruction that is supported by a PowerPoint presentation. Instructor discusses importance of knowing and understanding resting, exercise, and post-exercise oxygen saturation, heart rate, and blood pressure. Oxygen saturation, heart rate, blood pressure, rating of perceived exertion, and dyspnea are reviewed along with a normal range for these values.    Exercise for the Pulmonary Patient Group instruction that is supported by a PowerPoint presentation. Instructor discusses benefits of exercise, core components of exercise, frequency, duration, and intensity of an exercise routine, importance of utilizing pulse oximetry during exercise, safety while exercising, and options of places to exercise outside of rehab.       MET Level  Group instruction provided by PowerPoint, verbal discussion, and written material to support subject matter. Instructor reviews what METs are and how to increase METs.    Pulmonary Medications Verbally interactive group education provided by instructor with focus on inhaled medications and proper administration.   Anatomy and Physiology of the Respiratory System Group instruction provided by PowerPoint, verbal discussion, and written material to support subject matter. Instructor reviews respiratory cycle and anatomical components of the respiratory system and their functions. Instructor also reviews differences in obstructive and restrictive respiratory diseases with examples of each.  Flowsheet Row PULMONARY REHAB OTHER RESPIRATORY from 11/08/2020 in [place]  Date 10/25/20  Educator Jess-H/O  Instruction Review Code 1- Verbalizes  Understanding       Oxygen Safety Group instruction provided by PowerPoint, verbal discussion, and written material to support subject matter. There is an overview of "What is Oxygen" and "Why do we need it".  Instructor also reviews how to create a safe environment for oxygen use, the importance of using oxygen as prescribed, and the risks of noncompliance. There is a brief discussion on traveling with oxygen and resources the patient may utilize. Flowsheet Row PULMONARY REHAB OTHER RESPIRATORY from 11/08/2020 in [place]  Date 11/01/20  Educator Handout       Oxygen Use Group instruction provided by PowerPoint, verbal discussion, and written material to discuss how supplemental oxygen is prescribed and different types of oxygen supply systems. Resources for more information are provided.    Breathing Techniques Group instruction that is supported by demonstration and informational handouts. Instructor discusses  the benefits of pursed lip and diaphragmatic breathing and detailed demonstration on how to perform both.     Risk Factor Reduction Group instruction that is supported by a PowerPoint presentation. Instructor discusses the definition of a risk factor, different risk factors for pulmonary disease, and how the heart and lungs work together.   MD Day A group question and answer session with a medical doctor that allows participants to ask questions that relate to their pulmonary disease state.   Nutrition for the Pulmonary Patient Group instruction provided by PowerPoint slides, verbal discussion, and written materials to support subject matter. The instructor gives an explanation and review of healthy diet recommendations, which includes a discussion on weight management, recommendations for fruit and vegetable consumption, as well as protein, fluid, caffeine, fiber, sodium, sugar, and alcohol. Tips for eating when patients are short of breath  are discussed. Flowsheet Row PULMONARY REHAB OTHER RESPIRATORY from 11/08/2020 in [place]  Date 11/08/20  Educator handout        Other Education Group or individual verbal, written, or video instructions that support the educational goals of the pulmonary rehab program. Flowsheet Row PULMONARY REHAB OTHER RESPIRATORY from 01/02/2022 in [place]  Date 01/02/22  [facility] Levels]  Educator Donnetta Simpers  Instruction Review Code 2- Demonstrated Understanding        Knowledge Questionnaire Score:  Knowledge Questionnaire Score - 12/27/21 1448       Knowledge Questionnaire Score   Pre Score 16/18             Core Components/Risk Factors/Patient Goals at Admission:  Personal Goals and Risk Factors at Admission - 12/27/21 1433       Core Components/Risk Factors/Patient Goals on Admission    Weight Management Weight Loss    Improve shortness of breath with ADL's Yes    Intervention Provide education, individualized exercise plan and daily activity instruction to help decrease symptoms of SOB with activities of daily living.    Expected Outcomes Short Term: Improve cardiorespiratory fitness to achieve a reduction of symptoms when performing ADLs;Long Term: Be able to perform more ADLs without symptoms or delay the onset of symptoms             Core Components/Risk Factors/Patient Goals Review:   Goals and Risk Factor Review     Row Name 01/01/22 0923 01/01/22 0924 01/28/22 0952         Core Components/Risk Factors/Patient Goals Review   Personal Goals Review Improve shortness of breath with ADL's;Develop more efficient breathing techniques such as purse lipped breathing and diaphragmatic breathing and practicing self-pacing with activity.;Increase knowledge of respiratory medications and ability to use respiratory devices properly. Improve shortness of breath with ADL's;Other Weight Management/Obesity     Review --  Weight loss is a concern for her. She is scheduled to start the program 01/02/22. We will continue to monior core components. Parish has attended only one session thus far. She was out sick. We will continue to monitor her core components.     Expected Outcomes -- See admission goals. See admission goals.              Core Components/Risk Factors/Patient Goals at Discharge (Final Review):   Goals and Risk Factor Review - 01/28/22 0952       Core Components/Risk Factors/Patient Goals Review   Personal Goals Review Weight Management/Obesity    Review Voula has attended only one session thus far. She was out sick. We  will continue to monitor her core components.    Expected Outcomes See admission goals.             ITP Comments: Pt is making expected progress toward Pulmonary Rehab goals after completing 1 sessions. Recommend continued exercise, life style modification, education, and utilization of breathing techniques to increase stamina and strength, while also decreasing shortness of breath with exertion.  Dr. Rodman Pickle is Medical Director for Pulmonary Rehab at [facility].

## 2022-01-30 ENCOUNTER — Encounter (HOSPITAL_COMMUNITY)
Admission: RE | Admit: 2022-01-30 | Discharge: 2022-01-30 | Disposition: A | Discharge status: Home / Self Care | Payer: PPO | Source: Ambulatory Visit | Attending: Pulmonary Disease | Admitting: Pulmonary Disease

## 2022-01-30 ENCOUNTER — Encounter (HOSPITAL_COMMUNITY): Payer: PPO

## 2022-01-30 DIAGNOSIS — J9611 Chronic respiratory failure with hypoxia: Secondary | ICD-10-CM | POA: Insufficient documentation

## 2022-01-30 NOTE — Progress Notes (Signed)
Daily Session Note  Patient Details  Name: Brandy Morales MRN: [id number] Date of Birth: 28-May-1945 Referring Provider:   [name] Pulmonary Rehab Walk Test from 12/27/2021 in [place]  Referring Provider [name]       Encounter Date: 01/30/2022  Check In:  Session Check In - 01/30/22 1438       Check-In   Supervising physician immediately available to respond to emergencies [facility] immediately available    Physician(s) Dr. Cyndia Skeeters    Location [facility]    Staff Present Rosebud Poles, RN, BSN;Randi Olen Cordial BS, ACSM-CEP, Exercise Physiologist;Kaylee Rosana Hoes, MS, ACSM-CEP, Exercise Physiologist;Dayshia Ballinas Ysidro Evert, RN    Virtual Visit No    Medication changes reported     No    Fall or balance concerns reported    No    Tobacco Cessation No Change    Warm-up and Cool-down Performed as group-led instruction    Resistance Training Performed Yes    VAD Patient? No    PAD/SET Patient? No      Pain Assessment   Currently in Pain? No/denies    Multiple Pain Sites No             Capillary Blood Glucose: No results found for this or any previous visit (from the past 24 hour(s)).    Social History   Tobacco Use  Smoking Status Former   Packs/day: 2.00   Years: 55.00   Total pack years: 110.00   Types: Cigarettes   Quit date: 2016   Years since quitting: 7.6  Smokeless Tobacco Never    Goals Met:  Proper associated with RPD/PD & O2 Sat Personal goals reviewed No report of concerns or symptoms today Strength training completed today  Goals Unmet:  Not Applicable  Comments: Service time is from 1312 to 1453 Pt's BP low at start of exercise improved with drinking water. She had decreased her BP medication to 1/2 of a tablet. She is going to call Dr. Inda Merlin to discuss her BP medication with him. At discharge her BP was 90/60.    Dr. Rodman Pickle is Medical Director for Pulmonary Rehab at [facility].

## 2022-02-03 ENCOUNTER — Telehealth (HOSPITAL_COMMUNITY): Payer: Self-pay | Admitting: *Deleted

## 2022-02-03 NOTE — Telephone Encounter (Signed)
Pt left message on departmental voicemail. Myliah will be out on Tuesday.  Brandy Morales is at the point she can walk without cane and is unsure of what she can do.  Will notify pulmonary rehab staff. Cherre Huger, BSN Cardiac and Training and development officer

## 2022-02-04 ENCOUNTER — Encounter (HOSPITAL_COMMUNITY): Payer: PPO

## 2022-02-06 ENCOUNTER — Encounter (HOSPITAL_COMMUNITY): Payer: PPO

## 2022-02-06 DIAGNOSIS — E559 Vitamin D deficiency, unspecified: Secondary | ICD-10-CM | POA: Diagnosis not present

## 2022-02-06 DIAGNOSIS — M5431 Sciatica, right side: Secondary | ICD-10-CM | POA: Diagnosis not present

## 2022-02-06 DIAGNOSIS — E7800 Pure hypercholesterolemia, unspecified: Secondary | ICD-10-CM | POA: Diagnosis not present

## 2022-02-06 DIAGNOSIS — J449 Chronic obstructive pulmonary disease, unspecified: Secondary | ICD-10-CM | POA: Diagnosis not present

## 2022-02-06 DIAGNOSIS — I700 Atherosclerosis of aorta: Secondary | ICD-10-CM | POA: Diagnosis not present

## 2022-02-06 DIAGNOSIS — F4320 Adjustment disorder, unspecified: Secondary | ICD-10-CM | POA: Diagnosis not present

## 2022-02-06 DIAGNOSIS — Z87442 Personal history of urinary calculi: Secondary | ICD-10-CM | POA: Diagnosis not present

## 2022-02-06 DIAGNOSIS — C3490 Malignant neoplasm of unspecified part of unspecified bronchus or lung: Secondary | ICD-10-CM | POA: Diagnosis not present

## 2022-02-06 DIAGNOSIS — J9611 Chronic respiratory failure with hypoxia: Secondary | ICD-10-CM | POA: Diagnosis not present

## 2022-02-06 DIAGNOSIS — I10 Essential (primary) hypertension: Secondary | ICD-10-CM | POA: Diagnosis not present

## 2022-02-06 DIAGNOSIS — R0902 Hypoxemia: Secondary | ICD-10-CM | POA: Diagnosis not present

## 2022-02-06 DIAGNOSIS — K219 Gastro-esophageal reflux disease without esophagitis: Secondary | ICD-10-CM | POA: Diagnosis not present

## 2022-02-11 ENCOUNTER — Encounter (HOSPITAL_COMMUNITY): Payer: PPO

## 2022-02-13 ENCOUNTER — Encounter (HOSPITAL_COMMUNITY): Payer: PPO

## 2022-02-13 ENCOUNTER — Encounter (HOSPITAL_COMMUNITY)
Admission: RE | Admit: 2022-02-13 | Discharge: 2022-02-13 | Disposition: A | Discharge status: Home / Self Care | Payer: PPO | Source: Ambulatory Visit | Attending: Pulmonary Disease | Admitting: Pulmonary Disease

## 2022-02-13 DIAGNOSIS — J9611 Chronic respiratory failure with hypoxia: Secondary | ICD-10-CM | POA: Diagnosis not present

## 2022-02-13 NOTE — Progress Notes (Signed)
Daily Session Note  Patient Details  Name: Brandy Morales MRN: [id number] Date of Birth: 02/03/1946 Referring Provider:   [name] Pulmonary Rehab Walk Test from 12/27/2021 in [place]  Referring Provider [name]       Encounter Date: 02/13/2022  Check In:  Session Check In - 02/13/22 1422       Check-In   Supervising physician immediately available to respond to emergencies [facility] - Physician supervision    Physician(s) Erskine Emery    Location [facility]    Staff Present Josephina Shih BS, ACSM-CEP, Exercise Physiologist;Aven Cegielski Rosana Hoes, MS, ACSM-CEP, Exercise Physiologist;Carlette Wilber Oliphant, RN, Rico Ala, MS, Exercise Physiologist;Jetta Gilford Rile BS, ACSM-CEP, Exercise Physiologist    Virtual Visit No    Medication changes reported     No    Fall or balance concerns reported    No    Tobacco Cessation No Change    Warm-up and Cool-down Performed as group-led instruction    Resistance Training Performed Yes    VAD Patient? No    PAD/SET Patient? No      Pain Assessment   Currently in Pain? No/denies    Pain Score 0-No pain    Multiple Pain Sites No             Capillary Blood Glucose: No results found for this or any previous visit (from the past 24 hour(s)).    Social History   Tobacco Use  Smoking Status Former   Packs/day: 2.00   Years: 55.00   Total pack years: 110.00   Types: Cigarettes   Quit date: 2016   Years since quitting: 7.7  Smokeless Tobacco Never    Goals Met:  Proper associated with RPD/PD & O2 Sat Exercise tolerated well No report of concerns or symptoms today Strength training completed today  Goals Unmet:  Not Applicable  Comments: Service time is from 1314 to 1444.    Dr. Rodman Pickle is Medical Director for Pulmonary Rehab at [facility].

## 2022-02-18 ENCOUNTER — Encounter (HOSPITAL_COMMUNITY)
Admission: RE | Admit: 2022-02-18 | Discharge: 2022-02-18 | Disposition: A | Discharge status: Home / Self Care | Payer: PPO | Source: Ambulatory Visit | Attending: Pulmonary Disease | Admitting: Pulmonary Disease

## 2022-02-18 ENCOUNTER — Encounter (HOSPITAL_COMMUNITY): Payer: PPO

## 2022-02-18 DIAGNOSIS — J9611 Chronic respiratory failure with hypoxia: Secondary | ICD-10-CM | POA: Diagnosis not present

## 2022-02-18 NOTE — Progress Notes (Signed)
Daily Session Note  Patient Details  Name: Brandy Morales MRN: [id number] Date of Birth: 05-Oct-1945 Referring Provider:   [name] Pulmonary Rehab Walk Test from 12/27/2021 in [place]  Referring Provider Brandy Morales       Encounter Date: 02/18/2022  Check In:  Session Check In - 02/18/22 1437       Check-In   Supervising physician immediately available to respond to emergencies [facility] - Physician supervision    Physician(s) Brandy Morales    Location [facility]    Staff Present Brandy Else, MS, ACSM-CEP, Exercise Physiologist;Brandy Morales, ACSM-CEP, Exercise Physiologist;Brandy Ysidro Evert, RN;Brandy Wilber Oliphant, RN, BSN;Brandy Dredge, RN, MHA    Virtual Visit No    Medication changes reported     No    Fall or balance concerns reported    No    Tobacco Cessation No Change    Warm-up and Cool-down Performed as group-led instruction    Resistance Training Performed Yes    VAD Patient? No    PAD/SET Patient? No      Pain Assessment   Currently in Pain? No/denies    Multiple Pain Sites No             Capillary Blood Glucose: No results found for this or any previous visit (from the past 24 hour(s)).    Social History   Tobacco Use  Smoking Status Former   Packs/day: 2.00   Years: 55.00   Total pack years: 110.00   Types: Cigarettes   Quit date: 2016   Years since quitting: 7.7  Smokeless Tobacco Never    Goals Met:  Proper associated with RPD/PD & O2 Sat Exercise tolerated well No report of concerns or symptoms today Strength training completed today  Goals Unmet:  Not Applicable  Comments: Service time is from 1329 to 1440.    Brandy Morales is Medical Director for Pulmonary Rehab at [facility].

## 2022-02-19 NOTE — Progress Notes (Signed)
Pulmonary Individual Treatment Plan  Patient Details  Name: Brandy Morales MRN: [id number] Date of Birth: July 01, 1945 Referring Provider:   [name] Pulmonary Rehab Walk Test from 12/27/2021 in [facility]  Referring Provider [name]       Initial Encounter Date:  Flowsheet Row Pulmonary Rehab Walk Test from 12/27/2021 in [facility]  Date 12/27/21       Visit Diagnosis: Chronic respiratory failure with hypoxia ([place])  Patient's Home Medications on Admission:   Current Outpatient Medications:    albuterol (ACCUNEB) 1.25 MG/3ML nebulizer solution, Take 1 ampule by nebulization every 6 (six) hours as needed for wheezing., Disp: , Rfl:    albuterol (VENTOLIN HFA) 108 (90 Base) MCG/ACT inhaler, Inhale 2 puffs into the lungs every 6 (six) hours as needed for wheezing or shortness of breath., Disp: , Rfl:    Artificial Tear Solution (GENTEAL TEARS OP), Place 1 drop into both eyes daily. , Disp: , Rfl:    atorvastatin (LIPITOR) 10 MG tablet, Take 5 mg by mouth 3 (three) times a week. Take on MWF, Disp: , Rfl:    Budeson-Glycopyrrol-Formoterol (BREZTRI AEROSPHERE) 160-9-4.8 MCG/ACT AERO, Inhale 2 puffs into the lungs in the morning and at bedtime., Disp: 10.7 g, Rfl: 5   cetirizine (ZYRTEC) 10 MG tablet, Take 10 mg by mouth daily., Disp: , Rfl:    Cholecalciferol (VITAMIN D3) 5000 units CAPS, Take 5,000 Units by mouth daily., Disp: , Rfl:    citalopram (CELEXA) 20 MG tablet, Take 20 mg by mouth daily., Disp: , Rfl:    ezetimibe (ZETIA) 10 MG tablet, Take 10 mg by mouth at bedtime., Disp: , Rfl:    fluticasone (FLONASE) 50 MCG/ACT nasal spray, Place 1 spray into both nostrils daily. , Disp: , Rfl:    losartan-hydrochlorothiazide (HYZAAR) 100-25 MG tablet, Take 0.5 tablets by mouth daily., Disp: , Rfl:    Multiple Vitamin (MULTIVITAMIN) tablet, Take 1 tablet by mouth daily., Disp: , Rfl:    omeprazole (PRILOSEC) 20 MG capsule, Take  20 mg by mouth daily. , Disp: , Rfl:    OXYGEN, Inhale 2 L into the lungs continuous. , Disp: , Rfl:    predniSONE (DELTASONE) 10 MG tablet, Take $RemoveBefo'20mg'UcqUXVgqkrp$  (2 tabs) for 2 days, then take $RemoveB'10mg'oQZXlEgj$  (1 tab) for 5 days, then stop., Disp: 9 tablet, Rfl: 0  Past Medical History: Past Medical History:  Diagnosis Date   Anxiety    Asthma    Cancer ([place])    COPD (chronic obstructive pulmonary disease) ([place])    GERD (gastroesophageal reflux disease)    History of kidney stones    History of radiation therapy 01/05/2020   IMRT right lung 54 Gy in 3 Fractions 12/29/2019 through 01/05/2020  Dr Gery Pray   Hypertension     Tobacco Use: Social History   Tobacco Use  Smoking Status Former   Packs/day: 2.00   Years: 55.00   Total pack years: 110.00   Types: Cigarettes   Quit date: 2016   Years since quitting: 7.7  Smokeless Tobacco Never    Labs: Review Flowsheet       Latest Ref Rng & Units 12/28/2020  Labs for ITP Cardiac and Pulmonary Rehab  TCO2 22 - 32 mmol/L 26     Capillary Blood Glucose: Lab Results  Component Value Date   GLUCAP 81 10/12/2019   GLUCAP 91 05/11/2017     Pulmonary Assessment Scores:  Pulmonary Assessment Scores     Row  Name 12/27/21 1448         ADL UCSD   ADL Phase Entry     SOB Score total 102       CAT Score   CAT Score 30       mMRC Score   mMRC Score 4             UCSD: Self-administered rating of dyspnea associated with activities of daily living (ADLs) 6-point scale (0 = "not at all" to 5 = "maximal or unable to do because of breathlessness")  Scoring Scores range from 0 to 120.  Minimally important difference is 5 units  CAT: CAT can identify the health impairment of COPD patients and is better correlated with disease progression.  CAT has a scoring range of zero to 40. The CAT score is classified into four groups of low (less than 10), medium (10 - 20), high (21-30) and very high (31-40) based on the impact level of disease on health  status. A CAT score over 10 suggests significant symptoms.  A worsening CAT score could be explained by an exacerbation, poor medication adherence, poor inhaler technique, or progression of COPD or comorbid conditions.  CAT MCID is 2 points  mMRC: mMRC (Modified Medical Research Council) Dyspnea Scale is used to assess the degree of baseline functional disability in patients of respiratory disease due to dyspnea. No minimal important difference is established. A decrease in score of 1 point or greater is considered a positive change.   Pulmonary Function Assessment:  Pulmonary Function Assessment - 12/27/21 1445       Breath   Bilateral Breath Sounds Decreased    Shortness of Breath Yes;Fear of Shortness of Breath;Limiting activity             Exercise Target Goals: Exercise Program Goal: Individual exercise prescription set using results from initial 6 min walk test and THRR while considering  patient's activity barriers and safety.   Exercise Prescription Goal: Initial exercise prescription builds to 30-45 minutes a day of aerobic activity, 2-3 days per week.  Home exercise guidelines will be given to patient during program as part of exercise prescription that the participant will acknowledge.  Activity Barriers & Risk Stratification:  Activity Barriers & Cardiac Risk Stratification - 12/27/21 1438       Activity Barriers & Cardiac Risk Stratification   Activity Barriers Deconditioning;Muscular Weakness;Shortness of Breath;History of Falls;Balance Concerns    Cardiac Risk Stratification Low             6 Minute Walk:  6 Minute Walk     Row Name 12/27/21 1543         6 Minute Walk   Phase Initial     Distance 790 feet     Walk Time 6 minutes     # of Rest Breaks 2     MPH 1.5     METS 1.75     RPE 13     Perceived Dyspnea  3     VO2 Peak 6.12     Symptoms Yes (comment)     Comments SOB and fatigue     Resting HR 107 bpm     Resting BP 105/70      Resting Oxygen Saturation  93 %     Exercise Oxygen Saturation  during 6 min walk 88 %     Max Ex. HR 117 bpm     Max Ex. BP 131/84     2 Minute Post BP  112/81       Interval HR   1 Minute HR 107     2 Minute HR 113     3 Minute HR 115     4 Minute HR 111     5 Minute HR 117     6 Minute HR 116     2 Minute Post HR 100     Interval Heart Rate? Yes       Interval Oxygen   Interval Oxygen? Yes     Baseline Oxygen Saturation % 93 %     1 Minute Oxygen Saturation % 93 %     1 Minute Liters of Oxygen 3 L     2 Minute Oxygen Saturation % 89 %     2 Minute Liters of Oxygen 3 L     3 Minute Oxygen Saturation % 89 %     3 Minute Liters of Oxygen 3 L     4 Minute Oxygen Saturation % 89 %     4 Minute Liters of Oxygen 3 L     5 Minute Oxygen Saturation % 88 %     5 Minute Liters of Oxygen 3 L     6 Minute Oxygen Saturation % 89 %     6 Minute Liters of Oxygen 3 L     2 Minute Post Oxygen Saturation % 96 %     2 Minute Post Liters of Oxygen 3 L              Oxygen Initial Assessment:  Oxygen Initial Assessment - 12/27/21 1436       Home Oxygen   Home Oxygen Device Home Concentrator    Sleep Oxygen Prescription Continuous    Liters per minute 2    Home Exercise Oxygen Prescription Pulsed    Liters per minute 3    Home Resting Oxygen Prescription Continuous    Liters per minute 2    Compliance with Home Oxygen Use Yes      Initial 6 min Walk   Oxygen Used Continuous    Liters per minute 3      Program Oxygen Prescription   Program Oxygen Prescription Continuous    Liters per minute 3    Comments used 3L during 6 MWT with 2 breaks for fatigue/SOB and SaO2 88      Intervention   Short Term Goals To learn and exhibit compliance with exercise, home and travel O2 prescription;To learn and understand importance of monitoring SPO2 with pulse oximeter and demonstrate accurate use of the pulse oximeter.;To learn and understand importance of maintaining oxygen  saturations>88%;To learn and demonstrate proper pursed lip breathing techniques or other breathing techniques. ;To learn and demonstrate proper use of respiratory medications    Long  Term Goals Exhibits compliance with exercise, home  and travel O2 prescription;Verbalizes importance of monitoring SPO2 with pulse oximeter and return demonstration;Maintenance of O2 saturations>88%;Exhibits proper breathing techniques, such as pursed lip breathing or other method taught during program session;Compliance with respiratory medication;Demonstrates proper use of MDI's             Oxygen Re-Evaluation:  Oxygen Re-Evaluation     Row Name 01/01/22 0729 02/17/22 0951           Program Oxygen Prescription   Program Oxygen Prescription Continuous Continuous      Liters per minute 3 3      Comments used 3L during 6 MWT with 2 breaks for fatigue/SOB and SaO2 88 --  Home Oxygen   Home Oxygen Device Home Concentrator Home Concentrator      Sleep Oxygen Prescription Continuous Continuous      Liters per minute 2 2      Home Exercise Oxygen Prescription Pulsed Pulsed      Liters per minute 3 3      Home Resting Oxygen Prescription Continuous Continuous      Liters per minute 2 2      Compliance with Home Oxygen Use Yes Yes        Goals/Expected Outcomes   Short Term Goals To learn and exhibit compliance with exercise, home and travel O2 prescription;To learn and understand importance of monitoring SPO2 with pulse oximeter and demonstrate accurate use of the pulse oximeter.;To learn and understand importance of maintaining oxygen saturations>88%;To learn and demonstrate proper pursed lip breathing techniques or other breathing techniques. ;To learn and demonstrate proper use of respiratory medications To learn and exhibit compliance with exercise, home and travel O2 prescription;To learn and understand importance of monitoring SPO2 with pulse oximeter and demonstrate accurate use of the pulse  oximeter.;To learn and understand importance of maintaining oxygen saturations>88%;To learn and demonstrate proper pursed lip breathing techniques or other breathing techniques. ;To learn and demonstrate proper use of respiratory medications      Long  Term Goals Exhibits compliance with exercise, home  and travel O2 prescription;Verbalizes importance of monitoring SPO2 with pulse oximeter and return demonstration;Maintenance of O2 saturations>88%;Exhibits proper breathing techniques, such as pursed lip breathing or other method taught during program session;Compliance with respiratory medication;Demonstrates proper use of MDI's Exhibits compliance with exercise, home  and travel O2 prescription;Verbalizes importance of monitoring SPO2 with pulse oximeter and return demonstration;Maintenance of O2 saturations>88%;Exhibits proper breathing techniques, such as pursed lip breathing or other method taught during program session;Compliance with respiratory medication;Demonstrates proper use of MDI's      Goals/Expected Outcomes Compliance and understanding of oxygen saturation monitoring and breathing techniques to decrease shortness of breath. Compliance and understanding of oxygen saturation monitoring and breathing techniques to decrease shortness of breath.               Oxygen Discharge (Final Oxygen Re-Evaluation):  Oxygen Re-Evaluation - 02/17/22 0951       Program Oxygen Prescription   Program Oxygen Prescription Continuous    Liters per minute 3      Home Oxygen   Home Oxygen Device Home Concentrator    Sleep Oxygen Prescription Continuous    Liters per minute 2    Home Exercise Oxygen Prescription Pulsed    Liters per minute 3    Home Resting Oxygen Prescription Continuous    Liters per minute 2    Compliance with Home Oxygen Use Yes      Goals/Expected Outcomes   Short Term Goals To learn and exhibit compliance with exercise, home and travel O2 prescription;To learn and understand  importance of monitoring SPO2 with pulse oximeter and demonstrate accurate use of the pulse oximeter.;To learn and understand importance of maintaining oxygen saturations>88%;To learn and demonstrate proper pursed lip breathing techniques or other breathing techniques. ;To learn and demonstrate proper use of respiratory medications    Long  Term Goals Exhibits compliance with exercise, home  and travel O2 prescription;Verbalizes importance of monitoring SPO2 with pulse oximeter and return demonstration;Maintenance of O2 saturations>88%;Exhibits proper breathing techniques, such as pursed lip breathing or other method taught during program session;Compliance with respiratory medication;Demonstrates proper use of MDI's    Goals/Expected Outcomes Compliance and understanding  of oxygen saturation monitoring and breathing techniques to decrease shortness of breath.             Initial Exercise Prescription:  Initial Exercise Prescription - 12/27/21 1500       Date of Initial Exercise RX and Referring Provider   Date 12/27/21    Referring Provider [name]    Expected Discharge Date 03/06/22      Oxygen   Oxygen Continuous    Liters 3    Maintain Oxygen Saturation 88% or higher      Recumbant Bike   Level 1    RPM 70    Watts 1    Minutes 15      Track   Minutes 15    METs 1.75      Prescription Details   Frequency (times per week) 2    Duration Progress to 30 minutes of continuous aerobic without signs/symptoms of physical distress      Intensity   THRR 40-80% of Max Heartrate 58-116    Ratings of Perceived Exertion 11-13    Perceived Dyspnea 0-4      Progression   Progression Continue to progress workloads to maintain intensity without signs/symptoms of physical distress.      Resistance Training   Training Prescription Yes    Weight red bands    Reps 10-15             Perform Capillary Blood Glucose checks as needed.  Exercise Prescription Changes:   Exercise  Prescription Changes     Row Name 01/02/22 1613 01/14/22 1600 01/23/22 0700 01/30/22 1505       Response to Exercise   Blood Pressure (Admit) 106/50 -- -- 86/60    Blood Pressure (Exercise) 90/50 -- -- 108/60    Blood Pressure (Exit) 105/64 -- -- 90/60    Heart Rate (Admit) 102 bpm -- -- 106 bpm    Heart Rate (Exercise) 124 bpm -- -- 114 bpm    Heart Rate (Exit) 109 bpm -- -- 96 bpm    Oxygen Saturation (Admit) 98 % -- -- 93 %    Oxygen Saturation (Exercise) 95 % -- -- 90 %    Oxygen Saturation (Exit) 98 % -- -- 95 %    Rating of Perceived Exertion (Exercise) 13 -- -- 13    Perceived Dyspnea (Exercise) 3 -- -- 2    Duration Continue with 30 min of aerobic exercise without signs/symptoms of physical distress. -- -- Continue with 30 min of aerobic exercise without signs/symptoms of physical distress.    Intensity --  40-80% HRR --  40-80% HRR -- THRR unchanged      Progression   Progression -- -- -- Continue to progress workloads to maintain intensity without signs/symptoms of physical distress.      Resistance Training   Training Prescription Yes -- -- Yes    Weight red bands -- -- red bands    Reps 10-15 -- -- 10-15    Time 10 Minutes -- -- --      Oxygen   Oxygen Continuous -- -- --    Liters 3 -- -- --      Recumbant Bike   Level 1 -- 1 --    RPM 70 -- 60 --    Minutes 15 -- 5 --    METs 1.9 -- -- --      NuStep   Level -- -- 1 1    SPM -- -- 60 60  Minutes -- -- -- 15    METs -- -- 15 1.6      Track   Laps 6 -- -- --    Minutes 15 -- 5 --    METs 1.7 -- -- --      Oxygen   Maintain Oxygen Saturation -- -- -- 88% or higher             Exercise Comments:   Exercise Comments     Row Name 01/02/22 1549           Exercise Comments Lauraine completed her first day of exericse. She has participated in Pulmonary Rehab before. She exercised for 15 min on the track and recumbent bike. Raelynn tolerated exercise fair. Will encourage more rest breaks next  session and reconsider ExRx. Nesa performed the warmup and cooldown standing without limitations.                Exercise Goals and Review:   Exercise Goals     Row Name 12/27/21 1548 12/31/21 0746 02/17/22 0915 02/17/22 0946       Exercise Goals   Increase Physical Activity Yes Yes Yes Yes    Intervention Provide advice, education, support and counseling about physical activity/exercise needs.;Develop an individualized exercise prescription for aerobic and resistive training based on initial evaluation findings, risk stratification, comorbidities and participant's personal goals. Provide advice, education, support and counseling about physical activity/exercise needs.;Develop an individualized exercise prescription for aerobic and resistive training based on initial evaluation findings, risk stratification, comorbidities and participant's personal goals. Provide advice, education, support and counseling about physical activity/exercise needs.;Develop an individualized exercise prescription for aerobic and resistive training based on initial evaluation findings, risk stratification, comorbidities and participant's personal goals. Provide advice, education, support and counseling about physical activity/exercise needs.;Develop an individualized exercise prescription for aerobic and resistive training based on initial evaluation findings, risk stratification, comorbidities and participant's personal goals.    Expected Outcomes Short Term: Attend rehab on a regular basis to increase amount of physical activity.;Long Term: Add in home exercise to make exercise part of routine and to increase amount of physical activity.;Long Term: Exercising regularly at least 3-5 days a week. Short Term: Attend rehab on a regular basis to increase amount of physical activity.;Long Term: Add in home exercise to make exercise part of routine and to increase amount of physical activity.;Long Term: Exercising regularly  at least 3-5 days a week. Short Term: Attend rehab on a regular basis to increase amount of physical activity.;Long Term: Add in home exercise to make exercise part of routine and to increase amount of physical activity.;Long Term: Exercising regularly at least 3-5 days a week. Short Term: Attend rehab on a regular basis to increase amount of physical activity.;Long Term: Add in home exercise to make exercise part of routine and to increase amount of physical activity.;Long Term: Exercising regularly at least 3-5 days a week.    Increase Strength and Stamina Yes Yes Yes Yes    Intervention Provide advice, education, support and counseling about physical activity/exercise needs.;Develop an individualized exercise prescription for aerobic and resistive training based on initial evaluation findings, risk stratification, comorbidities and participant's personal goals. Provide advice, education, support and counseling about physical activity/exercise needs.;Develop an individualized exercise prescription for aerobic and resistive training based on initial evaluation findings, risk stratification, comorbidities and participant's personal goals. Provide advice, education, support and counseling about physical activity/exercise needs.;Develop an individualized exercise prescription for aerobic and resistive training based on initial evaluation findings, risk  stratification, comorbidities and participant's personal goals. Provide advice, education, support and counseling about physical activity/exercise needs.;Develop an individualized exercise prescription for aerobic and resistive training based on initial evaluation findings, risk stratification, comorbidities and participant's personal goals.    Expected Outcomes Short Term: Increase workloads from initial exercise prescription for resistance, speed, and METs.;Short Term: Perform resistance training exercises routinely during rehab and add in resistance training at  home;Long Term: Improve cardiorespiratory fitness, muscular endurance and strength as measured by increased METs and functional capacity (6MWT) Short Term: Increase workloads from initial exercise prescription for resistance, speed, and METs.;Short Term: Perform resistance training exercises routinely during rehab and add in resistance training at home;Long Term: Improve cardiorespiratory fitness, muscular endurance and strength as measured by increased METs and functional capacity (6MWT) Short Term: Increase workloads from initial exercise prescription for resistance, speed, and METs.;Short Term: Perform resistance training exercises routinely during rehab and add in resistance training at home;Long Term: Improve cardiorespiratory fitness, muscular endurance and strength as measured by increased METs and functional capacity (6MWT) Short Term: Increase workloads from initial exercise prescription for resistance, speed, and METs.;Short Term: Perform resistance training exercises routinely during rehab and add in resistance training at home;Long Term: Improve cardiorespiratory fitness, muscular endurance and strength as measured by increased METs and functional capacity (6MWT)    Able to understand and use rate of perceived exertion (RPE) scale Yes Yes Yes Yes    Intervention Provide education and explanation on how to use RPE scale Provide education and explanation on how to use RPE scale Provide education and explanation on how to use RPE scale Provide education and explanation on how to use RPE scale    Expected Outcomes Short Term: Able to use RPE daily in rehab to express subjective intensity level;Long Term:  Able to use RPE to guide intensity level when exercising independently Short Term: Able to use RPE daily in rehab to express subjective intensity level;Long Term:  Able to use RPE to guide intensity level when exercising independently Short Term: Able to use RPE daily in rehab to express subjective  intensity level;Long Term:  Able to use RPE to guide intensity level when exercising independently Short Term: Able to use RPE daily in rehab to express subjective intensity level;Long Term:  Able to use RPE to guide intensity level when exercising independently    Able to understand and use Dyspnea scale Yes Yes Yes Yes    Intervention Provide education and explanation on how to use Dyspnea scale Provide education and explanation on how to use Dyspnea scale Provide education and explanation on how to use Dyspnea scale Provide education and explanation on how to use Dyspnea scale    Expected Outcomes Short Term: Able to use Dyspnea scale daily in rehab to express subjective sense of shortness of breath during exertion;Long Term: Able to use Dyspnea scale to guide intensity level when exercising independently Short Term: Able to use Dyspnea scale daily in rehab to express subjective sense of shortness of breath during exertion;Long Term: Able to use Dyspnea scale to guide intensity level when exercising independently Short Term: Able to use Dyspnea scale daily in rehab to express subjective sense of shortness of breath during exertion;Long Term: Able to use Dyspnea scale to guide intensity level when exercising independently Short Term: Able to use Dyspnea scale daily in rehab to express subjective sense of shortness of breath during exertion;Long Term: Able to use Dyspnea scale to guide intensity level when exercising independently    Knowledge and understanding  of Target Heart Rate Range (THRR) Yes Yes Yes Yes    Intervention Provide education and explanation of THRR including how the numbers were predicted and where they are located for reference Provide education and explanation of THRR including how the numbers were predicted and where they are located for reference Provide education and explanation of THRR including how the numbers were predicted and where they are located for reference Provide education  and explanation of THRR including how the numbers were predicted and where they are located for reference    Expected Outcomes Short Term: Able to state/look up THRR;Long Term: Able to use THRR to govern intensity when exercising independently;Short Term: Able to use daily as guideline for intensity in rehab Short Term: Able to state/look up THRR;Long Term: Able to use THRR to govern intensity when exercising independently;Short Term: Able to use daily as guideline for intensity in rehab Short Term: Able to state/look up THRR;Long Term: Able to use THRR to govern intensity when exercising independently;Short Term: Able to use daily as guideline for intensity in rehab Short Term: Able to state/look up THRR;Long Term: Able to use THRR to govern intensity when exercising independently;Short Term: Able to use daily as guideline for intensity in rehab    Understanding of Exercise Prescription Yes Yes Yes Yes    Intervention Provide education, explanation, and written materials on patient's individual exercise prescription Provide education, explanation, and written materials on patient's individual exercise prescription Provide education, explanation, and written materials on patient's individual exercise prescription Provide education, explanation, and written materials on patient's individual exercise prescription    Expected Outcomes Short Term: Able to explain program exercise prescription;Long Term: Able to explain home exercise prescription to exercise independently Short Term: Able to explain program exercise prescription;Long Term: Able to explain home exercise prescription to exercise independently Short Term: Able to explain program exercise prescription;Long Term: Able to explain home exercise prescription to exercise independently Short Term: Able to explain program exercise prescription;Long Term: Able to explain home exercise prescription to exercise independently             Exercise Goals  Re-Evaluation :  Exercise Goals Re-Evaluation     [place] Name 12/31/21 0746 02/17/22 0915 02/17/22 0946         Exercise Goal Re-Evaluation   Exercise Goals Review Increase Physical Activity;Increase Strength and Stamina;Able to understand and use rate of perceived exertion (RPE) scale;Able to understand and use Dyspnea scale;Knowledge and understanding of Target Heart Rate Range (THRR);Understanding of Exercise Prescription Increase Physical Activity;Increase Strength and Stamina;Able to understand and use rate of perceived exertion (RPE) scale;Able to understand and use Dyspnea scale;Knowledge and understanding of Target Heart Rate Range (THRR);Understanding of Exercise Prescription (P)  Increase Physical Activity;Increase Strength and Stamina;Able to understand and use rate of perceived exertion (RPE) scale;Able to understand and use Dyspnea scale;Knowledge and understanding of Target Heart Rate Range (THRR);Understanding of Exercise Prescription     Comments Haydyn ks scheduled to begin exercise next week. Will continue to monitor and progress as able. Silvana ks scheduled to begin exercise next week. Will continue to monitor and progress as able. (P)  Summerlynn has completed 3 exercise sessions and missed numerous sessions since beginning program, taking one break for a pulmonary exacerbation and another break when she twisted her knee with a fall. She has had one exercise session since being back. She exercised on the nustep for 15 min, level 1, METs 1.6. She then exercised on the recumbent bike for 15 min, level  1, METs 1.8. tolerated fair. She had an increased HR, presumably due to prednisone. she performed warmup and cool down standing with support.  Will continue to monitor and progress as able.     Expected Outcomes Through exercise at rehab and home, the patient will decrease shortness of breath with daily activities and feel confident in carrying out an exercise regimn at home. Through exercise at rehab  and home, the patient will decrease shortness of breath with daily activities and feel confident in carrying out an exercise regimn at home. (P)  Through exercise at rehab and home, the patient will decrease shortness of breath with daily activities and feel confident in carrying out an exercise regimn at home.              Discharge Exercise Prescription (Final Exercise Prescription Changes):  Exercise Prescription Changes - 01/30/22 1505       Response to Exercise   Blood Pressure (Admit) 86/60    Blood Pressure (Exercise) 108/60    Blood Pressure (Exit) 90/60    Heart Rate (Admit) 106 bpm    Heart Rate (Exercise) 114 bpm    Heart Rate (Exit) 96 bpm    Oxygen Saturation (Admit) 93 %    Oxygen Saturation (Exercise) 90 %    Oxygen Saturation (Exit) 95 %    Rating of Perceived Exertion (Exercise) 13    Perceived Dyspnea (Exercise) 2    Duration Continue with 30 min of aerobic exercise without signs/symptoms of physical distress.    Intensity THRR unchanged      Progression   Progression Continue to progress workloads to maintain intensity without signs/symptoms of physical distress.      Resistance Training   Training Prescription Yes    Weight red bands    Reps 10-15      NuStep   Level 1    SPM 60    Minutes 15    METs 1.6      Oxygen   Maintain Oxygen Saturation 88% or higher             Nutrition:  Target Goals: Understanding of nutrition guidelines, daily intake of sodium '1500mg'$ , cholesterol '200mg'$ , calories 30% from fat and 7% or less from saturated fats, daily to have 5 or more servings of fruits and vegetables.  Biometrics:  Pre Biometrics - 12/27/21 1548       Pre Biometrics   Grip Strength 20 kg              Nutrition Therapy Plan and Nutrition Goals:  Nutrition Therapy & Goals - 01/30/22 1432       Nutrition Therapy   Diet Heart Healthy Diet    Drug/Food Interactions Statins/Certain Fruits      Personal Nutrition Goals   Nutrition  Goal Patient to choose a daily variety of fruits, vegetables, whole grains, lean protein/plant protein, nonfat/lowfat dairy as part of balanced lifestyle    Personal Goal #2 Patient to limit to '1500mg'$  of sodium daily    Personal Goal #3 Patient to identify and limit food sources of saturated fat, trans fat, refined carbohydrates, and sodium.    Comments After not returning to pulmonary rehab after the first week, patient was referred back to rehab and is willing to try program again.      Intervention Plan   Intervention Prescribe, educate and counsel regarding individualized specific dietary modifications aiming towards targeted core components such as weight, hypertension, lipid management, diabetes, heart failure and other comorbidities.  Expected Outcomes Short Term Goal: Understand basic principles of dietary content, such as calories, fat, sodium, cholesterol and nutrients.;Long Term Goal: Adherence to prescribed nutrition plan.             Nutrition Assessments:  MEDIFICTS Score Key: ?70 Need to make dietary changes  40-70 Heart Healthy Diet ? 40 Therapeutic Level Cholesterol Diet  Flowsheet Row PULMONARY REHAB OTHER RESPIRATORY from 11/29/2020 in [place]  Picture Your Plate Total Score on Discharge 60      Picture Your Plate Scores: <16 Unhealthy dietary pattern with much room for improvement. 41-50 Dietary pattern unlikely to meet recommendations for good health and room for improvement. 51-60 More healthful dietary pattern, with some room for improvement.  >60 Healthy dietary pattern, although there may be some specific behaviors that could be improved.    Nutrition Goals Re-Evaluation:  Nutrition Goals Re-Evaluation     [place] Name 01/02/22 1511 01/30/22 1432           Goals   Current Weight 171 lb 4.8 oz (77.7 kg) --      Comment She has previously completed pulmonary rehab ~1 year ago. Weight stable from 1 year ago. --       Expected Outcome Patient is getting groceries delivered and lives alone. She has medical history of hypercholesterolemia, HTN, and atherosclerosis of aorta. Expect improvement with heart healthy dietary changes. After not returning to pulmonary rehab after the first week, patient was referred back to rehab and is motivated to try program again.               Nutrition Goals Discharge (Final Nutrition Goals Re-Evaluation):  Nutrition Goals Re-Evaluation - 01/30/22 1432       Goals   Expected Outcome After not returning to pulmonary rehab after the first week, patient was referred back to rehab and is motivated to try program again.             Psychosocial: Target Goals: Acknowledge presence or absence of significant depression and/or stress, maximize coping skills, provide positive support system. Participant is able to verbalize types and ability to use techniques and skills needed for reducing stress and depression.  Initial Review & Psychosocial Screening:  Initial Psych Review & Screening - 12/27/21 1428       Initial Review   Current issues with None Identified      Family Dynamics   Good Support System? Yes    Comments step daughter Erasmo Downer and her husband, friends      Barriers   Psychosocial barriers to participate in program The patient should benefit from training in stress management and relaxation.      Screening Interventions   Interventions Encouraged to exercise;To provide support and resources with identified psychosocial needs    Expected Outcomes Short Term goal: Utilizing psychosocial counselor, staff and physician to assist with identification of specific Stressors or current issues interfering with healing process. Setting desired goal for each stressor or current issue identified.;Long Term Goal: Stressors or current issues are controlled or eliminated.;Short Term goal: Identification and review with participant of any Quality of Life or Depression  concerns found by scoring the questionnaire.;Long Term goal: The participant improves quality of Life and PHQ9 Scores as seen by post scores and/or verbalization of changes             Quality of Life Scores:  Scores of 19 and below usually indicate a poorer quality of life in these areas.  A  difference of  2-3 points is a clinically meaningful difference.  A difference of 2-3 points in the total score of the Quality of Life Index has been associated with significant improvement in overall quality of life, self-image, physical symptoms, and general health in studies assessing change in quality of life.  PHQ-9: Review Flowsheet       12/27/2021 11/29/2020 09/24/2020  Depression screen PHQ 2/9  Decreased Interest 0 0 0 0 0  Down, Depressed, Hopeless 1 1 0 0 0  PHQ - 2 Score 1 1 0 0 0  Altered sleeping 1 0 2  Tired, decreased energy 0 2 1  Change in appetite 0 0 0  Feeling bad or failure about yourself  0 0 0  Trouble concentrating 0 0 0  Moving slowly or fidgety/restless 0 0 0  Suicidal thoughts 0 0 0  PHQ-9 Score 2 2 -  Difficult doing work/chores Not difficult at all Not difficult at all Not difficult at all   Interpretation of Total Score  Total Score Depression Severity:  1-4 = Minimal depression, 5-9 = Mild depression, 10-14 = Moderate depression, 15-19 = Moderately severe depression, 20-27 = Severe depression   Psychosocial Evaluation and Intervention:  Psychosocial Evaluation - 12/27/21 1431       Psychosocial Evaluation & Interventions   Interventions Stress management education;Relaxation education;Encouraged to exercise with the program and follow exercise prescription    Comments --    Expected Outcomes Lillie to participate in pulmonary rehab    Continue Psychosocial Services  Follow up required by staff             Psychosocial Re-Evaluation:  Psychosocial Re-Evaluation     [place] Name 01/01/22 0920 01/28/22 0938 02/19/22 1057         Psychosocial  Re-Evaluation   Current issues with None Identified None Identified None Identified     Comments Kourtlynn has attended the PR orientation session 12/27/21 and is scheduled to start the program 01/02/22. Will continue to monitor for any psychosocial barriers. Deloris has only attended one exercise session thus far. At that session she was very SOB and totally exhausted by the end of class. We recommended for her to call her MD. Since that time she has had been on 2 rounds of steroids and taken antibiotics. She was going to discharge from the program but later decided that she would return. She plans to return 9/7. Hilja has been participating in the [place] program without any psychosocial barriers. She has had some absences due to some knee pain which is something that she has a history of. Shital has great family support. She is driving herself to the program.     Expected Outcomes For her to participate in the PR program without any psychosocial issues or concerns. We will continue to monitor her psychosocial barriers. For her to continue to attend the program without any psychosocial concerns or issues.     Interventions Relaxation education;Stress management education Encouraged to attend Pulmonary Rehabilitation for the exercise Relaxation education;Encouraged to attend Pulmonary Rehabilitation for the exercise     Continue Psychosocial Services  Follow up required by staff No Follow up required No Follow up required              Psychosocial Discharge (Final Psychosocial Re-Evaluation):  Psychosocial Re-Evaluation - 02/19/22 1057       Psychosocial Re-Evaluation   Current issues with None Identified    Comments Kerrigan has been participating in the PR program without any  psychosocial barriers. She has had some absences due to some knee pain which is something that she has a history of. Arieana has great family support. She is driving herself to the program.    Expected Outcomes For her to continue to attend  the program without any psychosocial concerns or issues.    Interventions Relaxation education;Encouraged to attend Pulmonary Rehabilitation for the exercise    Continue Psychosocial Services  No Follow up required             Education: Education Goals: Education classes will be provided on a weekly basis, covering required topics. Participant will state understanding/return demonstration of topics presented.  Learning Barriers/Preferences:  Learning Barriers/Preferences - 12/27/21 1432       Learning Barriers/Preferences   Learning Preferences Audio;Group Instruction;Individual Instruction;Verbal Instruction             Education Topics: Introduction to Pulmonary Rehab Group instruction provided by PowerPoint, verbal discussion, and written material to support subject matter. Instructor reviews what Pulmonary Rehab is, the purpose of the program, and how patients are referred.     Know Your Numbers Group instruction that is supported by a PowerPoint presentation. Instructor discusses importance of knowing and understanding resting, exercise, and post-exercise oxygen saturation, heart rate, and blood pressure. Oxygen saturation, heart rate, blood pressure, rating of perceived exertion, and dyspnea are reviewed along with a normal range for these values.    Exercise for the Pulmonary Patient Group instruction that is supported by a PowerPoint presentation. Instructor discusses benefits of exercise, core components of exercise, frequency, duration, and intensity of an exercise routine, importance of utilizing pulse oximetry during exercise, safety while exercising, and options of places to exercise outside of rehab.       MET Level  Group instruction provided by PowerPoint, verbal discussion, and written material to support subject matter. Instructor reviews what METs are and how to increase METs.    Pulmonary Medications Verbally interactive group education provided by  instructor with focus on inhaled medications and proper administration.   Anatomy and Physiology of the Respiratory System Group instruction provided by PowerPoint, verbal discussion, and written material to support subject matter. Instructor reviews respiratory cycle and anatomical components of the respiratory system and their functions. Instructor also reviews differences in obstructive and restrictive respiratory diseases with examples of each.  Flowsheet Row PULMONARY REHAB OTHER RESPIRATORY from 11/08/2020 in [place]  Date 10/25/20  Educator Jess-H/O  Instruction Review Code 1- Verbalizes Understanding       Oxygen Safety Group instruction provided by PowerPoint, verbal discussion, and written material to support subject matter. There is an overview of "What is Oxygen" and "Why do we need it".  Instructor also reviews how to create a safe environment for oxygen use, the importance of using oxygen as prescribed, and the risks of noncompliance. There is a brief discussion on traveling with oxygen and resources the patient may utilize. Flowsheet Row PULMONARY REHAB OTHER RESPIRATORY from 02/13/2022 in [place]  Date 01/30/22  Educator Erasmo Downer  [Handout]  Instruction Review Code 1- Verbalizes Understanding       Oxygen Use Group instruction provided by PowerPoint, verbal discussion, and written material to discuss how supplemental oxygen is prescribed and different types of oxygen supply systems. Resources for more information are provided.    Breathing Techniques Group instruction that is supported by demonstration and informational handouts. Instructor discusses the benefits of pursed lip and diaphragmatic breathing and detailed demonstration  on how to perform both.  Flowsheet Row PULMONARY REHAB OTHER RESPIRATORY from 02/13/2022 in [place]  Date 02/13/22  Educator EP  Instruction  Review Code 1- Verbalizes Understanding        Risk Factor Reduction Group instruction that is supported by a PowerPoint presentation. Instructor discusses the definition of a risk factor, different risk factors for pulmonary disease, and how the heart and lungs work together.   MD Day A group question and answer session with a medical doctor that allows participants to ask questions that relate to their pulmonary disease state.   Nutrition for the Pulmonary Patient Group instruction provided by PowerPoint slides, verbal discussion, and written materials to support subject matter. The instructor gives an explanation and review of healthy diet recommendations, which includes a discussion on weight management, recommendations for fruit and vegetable consumption, as well as protein, fluid, caffeine, fiber, sodium, sugar, and alcohol. Tips for eating when patients are short of breath are discussed. Flowsheet Row PULMONARY REHAB OTHER RESPIRATORY from 11/08/2020 in [place]  Date 11/08/20  Educator handout        Other Education Group or individual verbal, written, or video instructions that support the educational goals of the pulmonary rehab program. Flowsheet Row PULMONARY REHAB OTHER RESPIRATORY from 02/13/2022 in [place]  Date 01/02/22  [facility] Levels]  Educator Donnetta Simpers  Instruction Review Code 2- Demonstrated Understanding        Knowledge Questionnaire Score:  Knowledge Questionnaire Score - 12/27/21 1448       Knowledge Questionnaire Score   Pre Score 16/18             Core Components/Risk Factors/Patient Goals at Admission:  Personal Goals and Risk Factors at Admission - 12/27/21 1433       Core Components/Risk Factors/Patient Goals on Admission    Weight Management Weight Loss    Improve shortness of breath with ADL's Yes    Intervention Provide education, individualized exercise plan and daily  activity instruction to help decrease symptoms of SOB with activities of daily living.    Expected Outcomes Short Term: Improve cardiorespiratory fitness to achieve a reduction of symptoms when performing ADLs;Long Term: Be able to perform more ADLs without symptoms or delay the onset of symptoms             Core Components/Risk Factors/Patient Goals Review:   Goals and Risk Factor Review     Row Name 01/01/22 0923 01/01/22 0924 01/28/22 0952 02/19/22 1102       Core Components/Risk Factors/Patient Goals Review   Personal Goals Review Improve shortness of breath with ADL's;Develop more efficient breathing techniques such as purse lipped breathing and diaphragmatic breathing and practicing self-pacing with activity.;Increase knowledge of respiratory medications and ability to use respiratory devices properly. Improve shortness of breath with ADL's;Other Weight Management/Obesity Weight Management/Obesity;Improve shortness of breath with ADL's;Develop more efficient breathing techniques such as purse lipped breathing and diaphragmatic breathing and practicing self-pacing with activity.;Increase knowledge of respiratory medications and ability to use respiratory devices properly.    Review -- Weight loss is a concern for her. She is scheduled to start the program 01/02/22. We will continue to monior core components. Naila has attended only one session thus far. She was out sick. We will continue to monitor her core components. Cherysh's weight has been the same since attending the program. She has had no weight loss.She is exercising on the nustep and recumbent bike.  Her progress has been very slow due to her deconditined state.We are providing encouragement. and support. She is execisng on her perscribed 3 liters of oxygen. [place] reports mild to moderate SOB and her saturations are 90-95% with exercise.She has had oxygen safety, METS, relaxation and breathing techniques education in class    Expected  Outcomes -- See admission goals. See admission goals. See admission goals             Core Components/Risk Factors/Patient Goals at Discharge (Final Review):   Goals and Risk Factor Review - 02/19/22 1102       Core Components/Risk Factors/Patient Goals Review   Personal Goals Review Weight Management/Obesity;Improve shortness of breath with ADL's;Develop more efficient breathing techniques such as purse lipped breathing and diaphragmatic breathing and practicing self-pacing with activity.;Increase knowledge of respiratory medications and ability to use respiratory devices properly.    Review Leshea's weight has been the same since attending the program. She has had no weight loss.She is exercising on the nustep and recumbent bike. Her progress has been very slow due to her deconditined state.We are providing encouragement. and support. She is execisng on her perscribed 3 liters of oxygen. Doni reports mild to moderate SOB and her saturations are 90-95% with exercise.She has had oxygen safety, METS, relaxation and breathing techniques education in class    Expected Outcomes See admission goals             ITP Comments: Pt is making expected progress toward Pulmonary Rehab goals after completing 4 sessions. Recommend continued exercise, life style modification, education, and utilization of breathing techniques to increase stamina and strength, while also decreasing shortness of breath with exertion.  Dr. Rodman Pickle is Medical Director for Pulmonary Rehab at [facility].

## 2022-02-20 ENCOUNTER — Encounter (HOSPITAL_COMMUNITY): Payer: PPO

## 2022-02-20 ENCOUNTER — Encounter (HOSPITAL_COMMUNITY)
Admission: RE | Admit: 2022-02-20 | Discharge: 2022-02-20 | Disposition: A | Discharge status: Home / Self Care | Payer: PPO | Source: Ambulatory Visit | Attending: Pulmonary Disease | Admitting: Pulmonary Disease

## 2022-02-20 VITALS — Wt 174.4 lb

## 2022-02-20 DIAGNOSIS — J9611 Chronic respiratory failure with hypoxia: Secondary | ICD-10-CM

## 2022-02-20 NOTE — Progress Notes (Signed)
Daily Session Note  Patient Details  Name: Brandy Morales MRN: [id number] Date of Birth: 1946/03/20 Referring Provider:   [name] Pulmonary Rehab Walk Test from 12/27/2021 in [place]  Referring Provider [name]       Encounter Date: 02/20/2022  Check In:  Session Check In - 02/20/22 1429       Check-In   Supervising physician immediately available to respond to emergencies [facility] - Physician supervision    Physician(s) Dr. Timoteo Gaul    Location [facility]    Staff Present Elmon Else, MS, ACSM-CEP, Exercise Physiologist;Randi Yevonne Pax, ACSM-CEP, Exercise Physiologist;Lisa Ysidro Evert, RN;Carlette Wilber Oliphant, RN, BSN    Virtual Visit No    Medication changes reported     No    Fall or balance concerns reported    No    Tobacco Cessation No Change    Warm-up and Cool-down Performed as group-led Higher education careers adviser Performed Yes    VAD Patient? No    PAD/SET Patient? No      Pain Assessment   Currently in Pain? No/denies    Pain Score 0-No pain    Multiple Pain Sites No             Capillary Blood Glucose: No results found for this or any previous visit (from the past 24 hour(s)).    Social History   Tobacco Use  Smoking Status Former   Packs/day: 2.00   Years: 55.00   Total pack years: 110.00   Types: Cigarettes   Quit date: 2016   Years since quitting: 7.7  Smokeless Tobacco Never    Goals Met:  Proper associated with RPD/PD & O2 Sat Exercise tolerated well No report of concerns or symptoms today Strength training completed today  Goals Unmet:  Not Applicable  Comments: Service time is from 1336 to 1450.    Dr. Rodman Pickle is Medical Director for Pulmonary Rehab at [facility].

## 2022-02-25 ENCOUNTER — Encounter (HOSPITAL_COMMUNITY): Payer: PPO

## 2022-02-25 ENCOUNTER — Telehealth (HOSPITAL_COMMUNITY): Payer: Self-pay | Admitting: Internal Medicine

## 2022-02-27 ENCOUNTER — Encounter (HOSPITAL_COMMUNITY): Payer: PPO

## 2022-03-04 ENCOUNTER — Encounter (HOSPITAL_COMMUNITY): Payer: PPO

## 2022-03-04 ENCOUNTER — Telehealth (HOSPITAL_COMMUNITY): Payer: Self-pay | Admitting: *Deleted

## 2022-03-04 DIAGNOSIS — J069 Acute upper respiratory infection, unspecified: Secondary | ICD-10-CM | POA: Diagnosis not present

## 2022-03-04 DIAGNOSIS — J441 Chronic obstructive pulmonary disease with (acute) exacerbation: Secondary | ICD-10-CM | POA: Diagnosis not present

## 2022-03-04 NOTE — Telephone Encounter (Signed)
Pt called to communicate that she will miss PR this week due to sickness. Sts she is SOB and having congestion. Encouraged pt to call for PCP to ensure appropriate care. She agreed. Yves Dill BS, ACSM-CEP 03/04/2022 3:47 PM

## 2022-03-06 ENCOUNTER — Encounter (HOSPITAL_COMMUNITY): Payer: PPO

## 2022-03-11 ENCOUNTER — Telehealth (HOSPITAL_COMMUNITY): Payer: Self-pay | Admitting: *Deleted

## 2022-03-11 ENCOUNTER — Encounter (HOSPITAL_COMMUNITY): Payer: PPO

## 2022-03-11 NOTE — Telephone Encounter (Signed)
Pt called and reported slow recovery from sickness and feels unable to return to pulmonary rehab at this time. Will cancel pts appointments for the future, she will be discharged.  Yves Dill BS, ACSM-CEP 03/11/2022 9:43 AM

## 2022-03-13 ENCOUNTER — Encounter (HOSPITAL_COMMUNITY): Payer: PPO

## 2022-03-14 DIAGNOSIS — Z961 Presence of intraocular lens: Secondary | ICD-10-CM | POA: Diagnosis not present

## 2022-03-14 DIAGNOSIS — H52203 Unspecified astigmatism, bilateral: Secondary | ICD-10-CM | POA: Diagnosis not present

## 2022-03-18 ENCOUNTER — Encounter (HOSPITAL_COMMUNITY): Payer: PPO

## 2022-03-18 NOTE — Progress Notes (Signed)
Discharge Progress Report  Patient Details  Name: Naydelin Ziegler MRN: [id number] Date of Birth: 1946/05/07 Referring Provider:   [name] [facility] Walk Test from 12/27/2021 in [place]  Referring Provider [name]        Number of Visits: 5   Reason for Discharge:  Early Exit:  Pt was absent due to sickness. She felt like due to her slow recovery she was not able to return.  Smoking History:  Social History   Tobacco Use  Smoking Status Former   Packs/day: 2.00   Years: 55.00   Total pack years: 110.00   Types: Cigarettes   Quit date: 2016   Years since quitting: 7.8  Smokeless Tobacco Never    Diagnosis:  Chronic respiratory failure with hypoxia ([place])  ADL [facility]:  Pulmonary Assessment Scores     Row Name 12/27/21 1448         ADL [facility]   ADL Phase Entry     SOB Score total 102       CAT Score   CAT Score 30       mMRC Score   mMRC Score 4              Initial Exercise Prescription:  Initial Exercise Prescription - 12/27/21 1500       Date of Initial Exercise RX and Referring Provider   Date 12/27/21    Referring Provider [name]    Expected Discharge Date 03/06/22      Oxygen   Oxygen Continuous    Liters 3    Maintain Oxygen Saturation 88% or higher      Recumbant Bike   Level 1    RPM 70    Watts 1    Minutes 15      Track   Minutes 15    METs 1.75      Prescription Details   Frequency (times per week) 2    Duration Progress to 30 minutes of continuous aerobic without signs/symptoms of physical distress      Intensity   THRR 40-80% of Max Heartrate 58-116    Ratings of Perceived Exertion 11-13    Perceived Dyspnea 0-4      Progression   Progression Continue to progress workloads to maintain intensity without signs/symptoms of physical distress.      Resistance Training   Training Prescription Yes    Weight red bands    Reps 10-15             Discharge Exercise Prescription  (Final Exercise Prescription Changes):  Exercise Prescription Changes - 02/25/22 1500       Response to Exercise   Blood Pressure (Admit) 120/70    Blood Pressure (Exit) 102/70    Heart Rate (Admit) 96 bpm    Heart Rate (Exercise) 115 bpm    Heart Rate (Exit) 109 bpm    Oxygen Saturation (Admit) 95 %    Oxygen Saturation (Exercise) 88 %    Oxygen Saturation (Exit) 94 %    Rating of Perceived Exertion (Exercise) 13    Perceived Dyspnea (Exercise) 3    Duration Continue with 30 min of aerobic exercise without signs/symptoms of physical distress.    Intensity THRR unchanged      Progression   Progression Continue to progress workloads to maintain intensity without signs/symptoms of physical distress.      Resistance Training   Training Prescription Yes    Weight red bands    Reps  10-15    Time 10 Minutes      Oxygen   Oxygen Continuous    Liters 3      Recumbant Bike   Level 1    Minutes 15    METs 1.4      NuStep   Level 1    SPM 60    Minutes 15    METs 1.7      Oxygen   Maintain Oxygen Saturation 88% or higher             Functional Capacity:  6 Minute Walk     Row Name 12/27/21 1543         6 Minute Walk   Phase Initial     Distance 790 feet     Walk Time 6 minutes     # of Rest Breaks 2     MPH 1.5     METS 1.75     RPE 13     Perceived Dyspnea  3     VO2 Peak 6.12     Symptoms Yes (comment)     Comments SOB and fatigue     Resting HR 107 bpm     Resting BP 105/70     Resting Oxygen Saturation  93 %     Exercise Oxygen Saturation  during 6 min walk 88 %     Max Ex. HR 117 bpm     Max Ex. BP 131/84     2 Minute Post BP 112/81       Interval HR   1 Minute HR 107     2 Minute HR 113     3 Minute HR 115     4 Minute HR 111     5 Minute HR 117     6 Minute HR 116     2 Minute Post HR 100     Interval Heart Rate? Yes       Interval Oxygen   Interval Oxygen? Yes     Baseline Oxygen Saturation % 93 %     1 Minute Oxygen Saturation %  93 %     1 Minute Liters of Oxygen 3 L     2 Minute Oxygen Saturation % 89 %     2 Minute Liters of Oxygen 3 L     3 Minute Oxygen Saturation % 89 %     3 Minute Liters of Oxygen 3 L     4 Minute Oxygen Saturation % 89 %     4 Minute Liters of Oxygen 3 L     5 Minute Oxygen Saturation % 88 %     5 Minute Liters of Oxygen 3 L     6 Minute Oxygen Saturation % 89 %     6 Minute Liters of Oxygen 3 L     2 Minute Post Oxygen Saturation % 96 %     2 Minute Post Liters of Oxygen 3 L              Psychological, QOL, Others - Outcomes: PHQ 2/9:    12/27/2021    2:22 PM 12/27/2021    2:20 PM 11/29/2020    1:51 PM 09/24/2020   10:26 AM 09/24/2020   10:25 AM  Depression screen PHQ 2/9  Decreased Interest 0 0 0 0   Down, Depressed, Hopeless 1 1 0 0   PHQ - 2 Score 1 1 0 0   Altered sleeping 1  0  2  Tired, decreased energy 0  2  1  Change in appetite 0  0  0  Feeling bad or failure about yourself  0  0  0  Trouble concentrating 0  0  0  Moving slowly or fidgety/restless 0  0  0  Suicidal thoughts 0  0  0  PHQ-9 Score 2  2    Difficult doing work/chores Not difficult at all  Not difficult at all  Not difficult at all    Quality of Life:   Personal Goals: Goals established at orientation with interventions provided to work toward goal.  Personal Goals and Risk Factors at Admission - 12/27/21 1433       Core Components/Risk Factors/Patient Goals on Admission    Weight Management Weight Loss    Improve shortness of breath with ADL's Yes    Intervention Provide education, individualized exercise plan and daily activity instruction to help decrease symptoms of SOB with activities of daily living.    Expected Outcomes Short Term: Improve cardiorespiratory fitness to achieve a reduction of symptoms when performing ADLs;Long Term: Be able to perform more ADLs without symptoms or delay the onset of symptoms              Personal Goals Discharge:  Goals and Risk Factor Review      Row Name 01/01/22 [id number] 01/01/22 0924 01/28/22 0952 02/19/22 1102       Core Components/Risk Factors/Patient Goals Review   Personal Goals Review Improve shortness of breath with ADL's;Develop more efficient breathing techniques such as purse lipped breathing and diaphragmatic breathing and practicing self-pacing with activity.;Increase knowledge of respiratory medications and ability to use respiratory devices properly. Improve shortness of breath with ADL's;Other Weight Management/Obesity Weight Management/Obesity;Improve shortness of breath with ADL's;Develop more efficient breathing techniques such as purse lipped breathing and diaphragmatic breathing and practicing self-pacing with activity.;Increase knowledge of respiratory medications and ability to use respiratory devices properly.    Review -- Weight loss is a concern for her. She is scheduled to start the program 01/02/22. We will continue to monior core components. Beautiful has attended only one session thus far. She was out sick. We will continue to monitor her core components. Izabella's weight has been the same since attending the program. She has had no weight loss.She is exercising on the nustep and recumbent bike. Her progress has been very slow due to her deconditined state.We are providing encouragement. and support. She is execisng on her perscribed 3 liters of oxygen. Yanel reports mild to moderate SOB and her saturations are 90-95% with exercise.She has had oxygen safety, METS, relaxation and breathing techniques education in class    Expected Outcomes -- See admission goals. See admission goals. See admission goals             Exercise Goals and Review:  Exercise Goals     Row Name 12/27/21 1548 12/31/21 0746 02/17/22 0915 02/17/22 0946       Exercise Goals   Increase Physical Activity Yes Yes Yes Yes    Intervention Provide advice, education, support and counseling about physical activity/exercise needs.;Develop an  individualized exercise prescription for aerobic and resistive training based on initial evaluation findings, risk stratification, comorbidities and participant's personal goals. Provide advice, education, support and counseling about physical activity/exercise needs.;Develop an individualized exercise prescription for aerobic and resistive training based on initial evaluation findings, risk stratification, comorbidities and participant's personal goals. Provide advice, education, support and counseling about physical activity/exercise needs.;Develop an individualized exercise  prescription for aerobic and resistive training based on initial evaluation findings, risk stratification, comorbidities and participant's personal goals. Provide advice, education, support and counseling about physical activity/exercise needs.;Develop an individualized exercise prescription for aerobic and resistive training based on initial evaluation findings, risk stratification, comorbidities and participant's personal goals.    Expected Outcomes Short Term: Attend rehab on a regular basis to increase amount of physical activity.;Long Term: Add in home exercise to make exercise part of routine and to increase amount of physical activity.;Long Term: Exercising regularly at least 3-5 days a week. Short Term: Attend rehab on a regular basis to increase amount of physical activity.;Long Term: Add in home exercise to make exercise part of routine and to increase amount of physical activity.;Long Term: Exercising regularly at least 3-5 days a week. Short Term: Attend rehab on a regular basis to increase amount of physical activity.;Long Term: Add in home exercise to make exercise part of routine and to increase amount of physical activity.;Long Term: Exercising regularly at least 3-5 days a week. Short Term: Attend rehab on a regular basis to increase amount of physical activity.;Long Term: Add in home exercise to make exercise part of routine  and to increase amount of physical activity.;Long Term: Exercising regularly at least 3-5 days a week.    Increase Strength and Stamina Yes Yes Yes Yes    Intervention Provide advice, education, support and counseling about physical activity/exercise needs.;Develop an individualized exercise prescription for aerobic and resistive training based on initial evaluation findings, risk stratification, comorbidities and participant's personal goals. Provide advice, education, support and counseling about physical activity/exercise needs.;Develop an individualized exercise prescription for aerobic and resistive training based on initial evaluation findings, risk stratification, comorbidities and participant's personal goals. Provide advice, education, support and counseling about physical activity/exercise needs.;Develop an individualized exercise prescription for aerobic and resistive training based on initial evaluation findings, risk stratification, comorbidities and participant's personal goals. Provide advice, education, support and counseling about physical activity/exercise needs.;Develop an individualized exercise prescription for aerobic and resistive training based on initial evaluation findings, risk stratification, comorbidities and participant's personal goals.    Expected Outcomes Short Term: Increase workloads from initial exercise prescription for resistance, speed, and METs.;Short Term: Perform resistance training exercises routinely during rehab and add in resistance training at home;Long Term: Improve cardiorespiratory fitness, muscular endurance and strength as measured by increased METs and functional capacity (6MWT) Short Term: Increase workloads from initial exercise prescription for resistance, speed, and METs.;Short Term: Perform resistance training exercises routinely during rehab and add in resistance training at home;Long Term: Improve cardiorespiratory fitness, muscular endurance and  strength as measured by increased METs and functional capacity (6MWT) Short Term: Increase workloads from initial exercise prescription for resistance, speed, and METs.;Short Term: Perform resistance training exercises routinely during rehab and add in resistance training at home;Long Term: Improve cardiorespiratory fitness, muscular endurance and strength as measured by increased METs and functional capacity (6MWT) Short Term: Increase workloads from initial exercise prescription for resistance, speed, and METs.;Short Term: Perform resistance training exercises routinely during rehab and add in resistance training at home;Long Term: Improve cardiorespiratory fitness, muscular endurance and strength as measured by increased METs and functional capacity (6MWT)    Able to understand and use rate of perceived exertion (RPE) scale Yes Yes Yes Yes    Intervention Provide education and explanation on how to use RPE scale Provide education and explanation on how to use RPE scale Provide education and explanation on how to use RPE scale Provide education and explanation  on how to use RPE scale    Expected Outcomes Short Term: Able to use RPE daily in rehab to express subjective intensity level;Long Term:  Able to use RPE to guide intensity level when exercising independently Short Term: Able to use RPE daily in rehab to express subjective intensity level;Long Term:  Able to use RPE to guide intensity level when exercising independently Short Term: Able to use RPE daily in rehab to express subjective intensity level;Long Term:  Able to use RPE to guide intensity level when exercising independently Short Term: Able to use RPE daily in rehab to express subjective intensity level;Long Term:  Able to use RPE to guide intensity level when exercising independently    Able to understand and use Dyspnea scale Yes Yes Yes Yes    Intervention Provide education and explanation on how to use Dyspnea scale Provide education and  explanation on how to use Dyspnea scale Provide education and explanation on how to use Dyspnea scale Provide education and explanation on how to use Dyspnea scale    Expected Outcomes Short Term: Able to use Dyspnea scale daily in rehab to express subjective sense of shortness of breath during exertion;Long Term: Able to use Dyspnea scale to guide intensity level when exercising independently Short Term: Able to use Dyspnea scale daily in rehab to express subjective sense of shortness of breath during exertion;Long Term: Able to use Dyspnea scale to guide intensity level when exercising independently Short Term: Able to use Dyspnea scale daily in rehab to express subjective sense of shortness of breath during exertion;Long Term: Able to use Dyspnea scale to guide intensity level when exercising independently Short Term: Able to use Dyspnea scale daily in rehab to express subjective sense of shortness of breath during exertion;Long Term: Able to use Dyspnea scale to guide intensity level when exercising independently    Knowledge and understanding of Target Heart Rate Range (THRR) Yes Yes Yes Yes    Intervention Provide education and explanation of THRR including how the numbers were predicted and where they are located for reference Provide education and explanation of THRR including how the numbers were predicted and where they are located for reference Provide education and explanation of THRR including how the numbers were predicted and where they are located for reference Provide education and explanation of THRR including how the numbers were predicted and where they are located for reference    Expected Outcomes Short Term: Able to state/look up THRR;Long Term: Able to use THRR to govern intensity when exercising independently;Short Term: Able to use daily as guideline for intensity in rehab Short Term: Able to state/look up THRR;Long Term: Able to use THRR to govern intensity when exercising  independently;Short Term: Able to use daily as guideline for intensity in rehab Short Term: Able to state/look up THRR;Long Term: Able to use THRR to govern intensity when exercising independently;Short Term: Able to use daily as guideline for intensity in rehab Short Term: Able to state/look up THRR;Long Term: Able to use THRR to govern intensity when exercising independently;Short Term: Able to use daily as guideline for intensity in rehab    Understanding of Exercise Prescription Yes Yes Yes Yes    Intervention Provide education, explanation, and written materials on patient's individual exercise prescription Provide education, explanation, and written materials on patient's individual exercise prescription Provide education, explanation, and written materials on patient's individual exercise prescription Provide education, explanation, and written materials on patient's individual exercise prescription    Expected Outcomes Short Term: Able  to explain program exercise prescription;Long Term: Able to explain home exercise prescription to exercise independently Short Term: Able to explain program exercise prescription;Long Term: Able to explain home exercise prescription to exercise independently Short Term: Able to explain program exercise prescription;Long Term: Able to explain home exercise prescription to exercise independently Short Term: Able to explain program exercise prescription;Long Term: Able to explain home exercise prescription to exercise independently             Exercise Goals Re-Evaluation:  Exercise Goals Re-Evaluation     Row Name 12/31/21 0746 02/17/22 0915 02/17/22 0946         Exercise Goal Re-Evaluation   Exercise Goals Review Increase Physical Activity;Increase Strength and Stamina;Able to understand and use rate of perceived exertion (RPE) scale;Able to understand and use Dyspnea scale;Knowledge and understanding of Target Heart Rate Range (THRR);Understanding of  Exercise Prescription Increase Physical Activity;Increase Strength and Stamina;Able to understand and use rate of perceived exertion (RPE) scale;Able to understand and use Dyspnea scale;Knowledge and understanding of Target Heart Rate Range (THRR);Understanding of Exercise Prescription (P)  Increase Physical Activity;Increase Strength and Stamina;Able to understand and use rate of perceived exertion (RPE) scale;Able to understand and use Dyspnea scale;Knowledge and understanding of Target Heart Rate Range (THRR);Understanding of Exercise Prescription     Comments Mayte ks scheduled to begin exercise next week. Will continue to monitor and progress as able. Wei ks scheduled to begin exercise next week. Will continue to monitor and progress as able. (P)  Rayel has completed 3 exercise sessions and missed numerous sessions since beginning program, taking one break for a pulmonary exacerbation and another break when she twisted her knee with a fall. She has had one exercise session since being back. She exercised on the nustep for 15 min, level 1, METs 1.6. She then exercised on the recumbent bike for 15 min, level 1, METs 1.8. tolerated fair. She had an increased HR, presumably due to prednisone. she performed warmup and cool down standing with support.  Will continue to monitor and progress as able.     Expected Outcomes Through exercise at rehab and home, the patient will decrease shortness of breath with daily activities and feel confident in carrying out an exercise regimn at home. Through exercise at rehab and home, the patient will decrease shortness of breath with daily activities and feel confident in carrying out an exercise regimn at home. (P)  Through exercise at rehab and home, the patient will decrease shortness of breath with daily activities and feel confident in carrying out an exercise regimn at home.              Nutrition & Weight - Outcomes:  Pre Biometrics - 12/27/21 1548       Pre  Biometrics   Grip Strength 20 kg              Nutrition:  Nutrition Therapy & Goals - 03/04/22 1534       Nutrition Therapy   Diet Heart Healthy Diet    Drug/Food Interactions Statins/Certain Fruits      Personal Nutrition Goals   Nutrition Goal Patient to choose a daily variety of fruits, vegetables, whole grains, lean protein/plant protein, nonfat/lowfat dairy as part of balanced lifestyle    Personal Goal #2 Patient to limit to 1500mg  of sodium daily    Personal Goal #3 Patient to identify and limit food sources of saturated fat, trans fat, refined carbohydrates, and sodium.    Comments Patient attendance has been  poor due to a death in the family, illness, etc. Nutrition intervention has been minimal due to variable attendance. No new labs at this time. Weight      Intervention Plan   Intervention Prescribe, educate and counsel regarding individualized specific dietary modifications aiming towards targeted core components such as weight, hypertension, lipid management, diabetes, heart failure and other comorbidities.    Expected Outcomes Short Term Goal: Understand basic principles of dietary content, such as calories, fat, sodium, cholesterol and nutrients.;Long Term Goal: Adherence to prescribed nutrition plan.             Nutrition Discharge:   Education Questionnaire Score:  Knowledge Questionnaire Score - 12/27/21 1448       Knowledge Questionnaire Score   Pre Score 16/18             Goals reviewed with patient; copy given to patient.

## 2022-03-18 NOTE — Addendum Note (Signed)
Encounter addended by: Deon Pilling, RN on: 03/18/2022 4:45 PM  Actions taken: Clinical Note Signed, Episode resolved

## 2022-03-19 ENCOUNTER — Other Ambulatory Visit: Payer: Self-pay

## 2022-03-19 ENCOUNTER — Encounter: Payer: Self-pay | Admitting: Pulmonary Disease

## 2022-03-19 ENCOUNTER — Ambulatory Visit: Payer: PPO | Admitting: Pulmonary Disease

## 2022-03-19 VITALS — BP 120/80 | HR 98 | Ht 64.0 in | Wt 174.6 lb

## 2022-03-19 DIAGNOSIS — J9611 Chronic respiratory failure with hypoxia: Secondary | ICD-10-CM

## 2022-03-19 DIAGNOSIS — J449 Chronic obstructive pulmonary disease, unspecified: Secondary | ICD-10-CM

## 2022-03-19 DIAGNOSIS — Z79899 Other long term (current) drug therapy: Secondary | ICD-10-CM

## 2022-03-19 DIAGNOSIS — F1920 Other psychoactive substance dependence, uncomplicated: Secondary | ICD-10-CM | POA: Diagnosis not present

## 2022-03-19 MED ORDER — AZITHROMYCIN 250 MG PO TABS: 250.0000 mg | ORAL_TABLET | ORAL | 0 refills | Status: DC

## 2022-03-19 MED ORDER — PREDNISONE 5 MG PO TABS: 5.0000 mg | ORAL_TABLET | Freq: Every day | ORAL | 0 refills | Status: DC

## 2022-03-19 NOTE — Progress Notes (Signed)
Synopsis: Referred in June 2021 for abnormal PET scan, Dr. Chase Caller, PCP: By [name], MD  Subjective:   PATIENT ID: Brandy Morales GENDER: female DOB: 29-Aug-1945, MRN: [id number]  Chief Complaint  Patient presents with   Follow-up     This is a 76 year old female past medical history of severe COPD, chronic oxygen dependent respiratory failure on 2 L pulse POC, asthma, hypertension.  Patient had CT scan imaging which revealed a right upper lobe pulmonary nodule abutting the major fissure.  Patient underwent nuclear medicine pet imaging which revealed a PET avid right upper lobe pulmonary nodule SUV 5.4.  Patient was referred for evaluation of navigational bronchoscopy and tissue sampling.  Patient felt to be not a good candidate for surgical resection due to poor lung function and functional status.  This was discussed with Dr. Chase Caller who is patient's primary pulmonologist.  OV 04/10/2021: I originally saw the patient in June 2021 for nodule consultation.  She was set up for bronchoscopy however declined due to concern for general anesthesia and ultimately sought radiation oncology in August 2021 and completed 54 Gray, 3 fractions of empiric SBRT to this highly suspicious nodule.  No tissue diagnosis was obtained.  Overall from a respiratory standpoint she is doing well.  She is using her portable oxygen.  She does feel like she may have had some decline in her functional status after hospitalization.  But we have not seen her in greater than a year.  She has been trying to stay at home is much as possible.  OV 03/19/2022: Patient here today for COPD follow-up.  She has chronic hypoxemic respiratory failure.  Currently on 2 L oxygen.  I initially saw her for a suspicious nodule.  She has had exacerbations requiring antibiotics and steroids.  She has been off and on steroids and predominantly on steroids at least for the past 3 months.  She is now down to 10 mg of prednisone per day.  She  is recently struggling after hearing her grandson was shot and killed as an Garment/textile technologist in [place].  She is feeling somewhat better but she understands that she has had some progressive decline over the past couple of weeks.      Past Medical History:  Diagnosis Date   Anxiety    Asthma    Cancer ([place])    COPD (chronic obstructive pulmonary disease) ([place])    GERD (gastroesophageal reflux disease)    History of kidney stones    History of radiation therapy 01/05/2020   IMRT right lung 54 Gy in 3 Fractions 12/29/2019 through 01/05/2020  Dr Gery Pray   Hypertension      No family history on file.   Past Surgical History:  Procedure Laterality Date   ABDOMINAL HYSTERECTOMY  age 55   partial 1 ovary left   APPENDECTOMY     colonscopy     CYSTOSCOPY W/ URETERAL STENT PLACEMENT Left 12/30/2020   Procedure: CYSTOSCOPY WITH RETROGRADE PYELOGRAM/URETERAL STENT PLACEMENT;  Surgeon: Cleon Gustin, MD;  Location: [facility];  Service: Urology;  Laterality: Left;   CYSTOSCOPY WITH RETROGRADE PYELOGRAM, URETEROSCOPY AND STENT PLACEMENT N/A 11/30/2017   Procedure: CYSTOSCOPY WITH BILATERAL RETROGRADE PYELOGRAM, LEFT URETEROSCOPY/ LEFT LASER LITHO AND LEFT STENT PLACEMENT;  Surgeon: Lucas Mallow, MD;  Location: [facility];  Service: Urology;  Laterality: N/A;   CYSTOSCOPY WITH RETROGRADE PYELOGRAM, URETEROSCOPY AND STENT PLACEMENT Left 02/01/2018   Procedure: CYSTOSCOPY WITH LEFT RETROGRADE PYELOGRAM, URETEROSCOPY HOLMIUM LASER AND STENT PLACEMENT;  Surgeon: Lucas Mallow, MD;  Location: [facility];  Service: Urology;  Laterality: Left;   EXTRACORPOREAL SHOCK WAVE LITHOTRIPSY Left 02/07/2021   Procedure: LEFT EXTRACORPOREAL SHOCK WAVE LITHOTRIPSY (ESWL);  Surgeon: Lucas Mallow, MD;  Location: [facility];  Service: Urology;  Laterality: Left;  80 MINS FOR THIS CASE NEED CRNA ON TRUCK  PLEASE CLOSE ROOM 2 AT 11:15 FOR  THIS CASE   EYE SURGERY Bilateral    Cataract    TONSILLECTOMY      Social History   Socioeconomic History   Marital status: Widowed    Spouse name: Not on file   Number of children: Not on file   Years of education: Not on file   Highest education level: Not on file  Occupational History   Not on file  Tobacco Use   Smoking status: Former    Packs/day: 2.00    Years: 55.00    Total pack years: 110.00    Types: Cigarettes    Quit date: 2016    Years since quitting: 7.8   Smokeless tobacco: Never  Vaping Use   Vaping Use: Never used  Substance and Sexual Activity   Alcohol use: Yes    Alcohol/week: 1.0 standard drink of alcohol    Types: 1 Glasses of wine per week    Comment: weekly   Drug use: Never   Sexual activity: Not Currently  Other Topics Concern   Not on file  Social History Narrative   Not on file   Social Determinants of Health   Financial Resource Strain: Not on file  Food Insecurity: Not on file  Transportation Needs: Not on file  Physical Activity: Not on file  Stress: Not on file  Social Connections: Not on file  Intimate Partner Violence: Not on file     Allergies  Allergen Reactions   Statins Cough   Latex Other (See Comments)    Broke out on face   Morphine And Related Other (See Comments)    Describes: "feel like I lose control of my body."   Oxycodone Other (See Comments)    Describes: "feels like I lose control of my body"     Outpatient Medications Prior to Visit  Medication Sig Dispense Refill   albuterol (ACCUNEB) 1.25 MG/3ML nebulizer solution Take 1 ampule by nebulization every 6 (six) hours as needed for wheezing.     albuterol (VENTOLIN HFA) 108 (90 Base) MCG/ACT inhaler Inhale 2 puffs into the lungs every 6 (six) hours as needed for wheezing or shortness of breath.     Budeson-Glycopyrrol-Formoterol (BREZTRI AEROSPHERE) 160-9-4.8 MCG/ACT AERO Inhale 2 puffs into the lungs in the morning and at bedtime. 10.7 g 5   cetirizine (ZYRTEC) 10 MG tablet Take 10 mg by mouth daily.      fluticasone (FLONASE) 50 MCG/ACT nasal spray Place 1 spray into both nostrils daily.      predniSONE (DELTASONE) 10 MG tablet Take 20mg  (2 tabs) for 2 days, then take 10mg  (1 tab) for 5 days, then stop. 9 tablet 0   Artificial Tear Solution (GENTEAL TEARS OP) Place 1 drop into both eyes daily.      atorvastatin (LIPITOR) 10 MG tablet Take 5 mg by mouth 3 (three) times a week. Take on MWF     Cholecalciferol (VITAMIN D3) 5000 units CAPS Take 5,000 Units by mouth daily.     citalopram (CELEXA) 20 MG tablet Take 20 mg by mouth daily.     ezetimibe (  ZETIA) 10 MG tablet Take 10 mg by mouth at bedtime.     losartan-hydrochlorothiazide (HYZAAR) 100-25 MG tablet Take 0.5 tablets by mouth daily.     Multiple Vitamin (MULTIVITAMIN) tablet Take 1 tablet by mouth daily.     omeprazole (PRILOSEC) 20 MG capsule Take 20 mg by mouth daily.      OXYGEN Inhale 2 L into the lungs continuous.      No facility-administered medications prior to visit.    Review of Systems  Constitutional:  Negative for chills, fever, malaise/fatigue and weight loss.  HENT:  Negative for hearing loss, sore throat and tinnitus.   Eyes:  Negative for blurred vision and double vision.  Respiratory:  Positive for shortness of breath. Negative for cough, hemoptysis, sputum production, wheezing and stridor.   Cardiovascular:  Negative for chest pain, palpitations, orthopnea, leg swelling and PND.  Gastrointestinal:  Negative for abdominal pain, constipation, diarrhea, heartburn, nausea and vomiting.  Genitourinary:  Negative for dysuria, hematuria and urgency.  Musculoskeletal:  Negative for joint pain and myalgias.  Skin:  Negative for itching and rash.  Neurological:  Negative for dizziness, tingling, weakness and headaches.  Endo/Heme/Allergies:  Negative for environmental allergies. Does not bruise/bleed easily.  Psychiatric/Behavioral:  Negative for depression. The patient is not nervous/anxious and does not have insomnia.    All other systems reviewed and are negative.    Objective:  Physical Exam Vitals reviewed.  Constitutional:      General: She is not in acute distress.    Appearance: She is well-developed.  HENT:     Head: Normocephalic and atraumatic.  Eyes:     General: No scleral icterus.    Conjunctiva/sclera: Conjunctivae normal.     Pupils: Pupils are equal, round, and reactive to light.  Neck:     Vascular: No JVD.     Trachea: No tracheal deviation.  Cardiovascular:     Rate and Rhythm: Normal rate and regular rhythm.     Heart sounds: Normal heart sounds. No murmur heard. Pulmonary:     Effort: Pulmonary effort is normal. No tachypnea, accessory muscle usage or respiratory distress.     Breath sounds: No stridor. No wheezing, rhonchi or rales.     Comments: Severely diminished breath sounds bilaterally Abdominal:     General: There is no distension.     Palpations: Abdomen is soft.     Tenderness: There is no abdominal tenderness.  Musculoskeletal:        General: No tenderness.     Cervical back: Neck supple.  Lymphadenopathy:     Cervical: No cervical adenopathy.  Skin:    General: Skin is warm and dry.     Capillary Refill: Capillary refill takes less than 2 seconds.     Findings: No rash.  Neurological:     Mental Status: She is alert and oriented to person, place, and time.  Psychiatric:        Behavior: Behavior normal.      Vitals:   03/19/22 1138  BP: 120/80  Pulse: 98  SpO2: 100%  Weight: 174 lb 9.6 oz (79.2 kg)  Height: 5\' 4"  (1.626 m)    100% on  RA BMI Readings from Last 3 Encounters:  03/19/22 29.97 kg/m  02/20/22 29.93 kg/m  01/22/22 29.11 kg/m   Wt Readings from Last 3 Encounters:  03/19/22 174 lb 9.6 oz (79.2 kg)  02/20/22 174 lb 6.1 oz (79.1 kg)  01/22/22 169 lb 9.6 oz (76.9 kg)  CBC    Component Value Date/Time   WBC 8.5 01/02/2021 0503   RBC 4.40 01/02/2021 0503   RBC 4.32 01/02/2021 0503   HGB 12.3 01/02/2021 0503   HCT  39.5 01/02/2021 0503   PLT 145 (L) 01/02/2021 0503   MCV 91.4 01/02/2021 0503   MCH 28.5 01/02/2021 0503   MCHC 31.1 01/02/2021 0503   RDW 14.3 01/02/2021 0503   LYMPHSABS 0.5 (L) 12/29/2020 0657   MONOABS 0.8 12/29/2020 0657   EOSABS 0.0 12/29/2020 0657   BASOSABS 0.0 12/29/2020 0657     Chest Imaging: 09/14/2019 CT chest: Right upper lobe spiculated pulmonary nodule abutting major fissure. The patient's images have been independently reviewed by me.    10/12/2019 nuclear medicine pet imaging. Right upper lobe PET avid pulmonary nodule SUV 5.45, 11 mm in largest cross-section, mild tenting of the fissure line. Concerning for primary bronchogenic carcinoma. The patient's images have been independently reviewed by me.    11/30/2020 CT chest: Treated nodule right upper lobe decreased size post radiation treatments Enlarging 7 mm peribronchovascular nodule in the left lower lobe. The patient's images have been independently reviewed by me.    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  Increased ill-defined groundglass nodules in the left upper lobe.  Likely inflammatory. Evidence of emphysema. The patient's images have been independently reviewed by me.     Pulmonary Functions Testing Results:    Latest Ref Rng & Units 05/14/2017   10:49 AM  PFT Results  FVC-Pre L 1.50   FVC-Predicted Pre % 48   Pre FEV1/FVC % % 42   FEV1-Pre L 0.63   FEV1-Predicted Pre % 26   DLCO uncorrected ml/min/mmHg 9.57   DLCO UNC% % 37   DLCO corrected ml/min/mmHg 9.09   DLCO COR %Predicted % 35   DLVA Predicted % 45   TLC L 9.98   TLC % Predicted % 192   RV % Predicted % 366     FeNO: none  Pathology: none  Echocardiogram: none  Heart Catheterization: none    Assessment & Plan:     ICD-10-CM   1. Medication management  Z79.899 EKG 12-Lead    CANCELED: EKG 12-Lead    2. Chronic  respiratory failure with hypoxia (HCC)  J96.11     3. Stage 4 very severe COPD by GOLD classification ([place])  J44.9     4. 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  She has steroid-dependent lung disease at this point.  Plan: Drop to 5 mg prednisone daily Start azithromycin 250 mg Monday Wednesday Friday. Continue Breztri, triple therapy inhaler regimen. EKG today to look at QTc RTC in 6 weeks with repeat ECG. If she is doing better can consider dropping to 2.5 mg prednisone in 6 weeks.     Current Outpatient Medications:    albuterol (ACCUNEB) 1.25 MG/3ML nebulizer solution, Take 1 ampule by nebulization every 6 (six) hours as needed for wheezing., Disp: , Rfl:    albuterol (VENTOLIN HFA) 108 (90 Base) MCG/ACT inhaler, Inhale 2 puffs into the lungs every 6 (six) hours as needed for wheezing or shortness of breath., Disp: , Rfl:    azithromycin (ZITHROMAX) 250 MG tablet, Take 1 tablet (250 mg total) by mouth every  Monday, Wednesday, and Friday., Disp: 6 tablet, Rfl: 0   Budeson-Glycopyrrol-Formoterol (BREZTRI AEROSPHERE) 160-9-4.8 MCG/ACT AERO, Inhale 2 puffs into the lungs in the morning and at bedtime., Disp: 10.7 g, Rfl: 5   cetirizine (ZYRTEC) 10 MG tablet, Take 10 mg by mouth daily., Disp: , Rfl:    fluticasone (FLONASE) 50 MCG/ACT nasal spray, Place 1 spray into both nostrils daily. , Disp: , Rfl:    predniSONE (DELTASONE) 10 MG tablet, Take 20mg  (2 tabs) for 2 days, then take 10mg  (1 tab) for 5 days, then stop., Disp: 9 tablet, Rfl: 0   predniSONE (DELTASONE) 5 MG tablet, Take 1 tablet (5 mg total) by mouth daily with breakfast., Disp: 60 tablet, Rfl: 0   Artificial Tear Solution (GENTEAL TEARS OP), Place 1 drop into both eyes daily. , Disp: , Rfl:    atorvastatin (LIPITOR) 10 MG tablet, Take 5 mg by  mouth 3 (three) times a week. Take on MWF, Disp: , Rfl:    Cholecalciferol (VITAMIN D3) 5000 units CAPS, Take 5,000 Units by mouth daily., Disp: , Rfl:    citalopram (CELEXA) 20 MG tablet, Take 20 mg by mouth daily., Disp: , Rfl:    ezetimibe (ZETIA) 10 MG tablet, Take 10 mg by mouth at bedtime., Disp: , Rfl:    losartan-hydrochlorothiazide (HYZAAR) 100-25 MG tablet, Take 0.5 tablets by mouth daily., Disp: , Rfl:    Multiple Vitamin (MULTIVITAMIN) tablet, Take 1 tablet by mouth daily., Disp: , Rfl:    omeprazole (PRILOSEC) 20 MG capsule, Take 20 mg by mouth daily. , Disp: , Rfl:    OXYGEN, Inhale 2 L into the lungs continuous. , Disp: , Rfl:    Garner Nash, DO [facility] 03/19/2022 1:09 PM

## 2022-03-19 NOTE — Patient Instructions (Signed)
Thank you for visiting Dr. Valeta Harms at [facility]. Today we recommend the following:  Orders Placed This Encounter  Procedures   EKG 12-Lead   ECG today   Meds ordered this encounter  Medications   predniSONE (DELTASONE) 5 MG tablet    Sig: Take 1 tablet (5 mg total) by mouth daily with breakfast.    Dispense:  60 tablet    Refill:  0   azithromycin (ZITHROMAX) 250 MG tablet    Sig: Take 1 tablet (250 mg total) by mouth every Monday, Wednesday, and Friday.    Dispense:  6 tablet    Refill:  0   Start 5mg  prednisone daily  We will consider tapering after next office visit   Return in about 6 weeks (around 04/30/2022) for with APP or Dr. Valeta Harms.    Please do your part to reduce the spread of COVID-19.

## 2022-03-20 ENCOUNTER — Encounter (HOSPITAL_COMMUNITY): Payer: PPO

## 2022-03-25 ENCOUNTER — Encounter (HOSPITAL_COMMUNITY): Payer: PPO

## 2022-03-27 ENCOUNTER — Encounter (HOSPITAL_COMMUNITY): Payer: PPO

## 2022-05-01 ENCOUNTER — Other Ambulatory Visit: Payer: Self-pay | Admitting: Pulmonary Disease

## 2022-05-05 DIAGNOSIS — J449 Chronic obstructive pulmonary disease, unspecified: Secondary | ICD-10-CM | POA: Diagnosis not present

## 2022-05-05 DIAGNOSIS — E7800 Pure hypercholesterolemia, unspecified: Secondary | ICD-10-CM | POA: Diagnosis not present

## 2022-05-05 DIAGNOSIS — I10 Essential (primary) hypertension: Secondary | ICD-10-CM | POA: Diagnosis not present

## 2022-05-05 DIAGNOSIS — K219 Gastro-esophageal reflux disease without esophagitis: Secondary | ICD-10-CM | POA: Diagnosis not present

## 2022-05-08 ENCOUNTER — Ambulatory Visit (INDEPENDENT_AMBULATORY_CARE_PROVIDER_SITE_OTHER): Payer: PPO | Admitting: Pulmonary Disease

## 2022-05-08 ENCOUNTER — Encounter: Payer: Self-pay | Admitting: Pulmonary Disease

## 2022-05-08 VITALS — BP 100/70 | HR 103 | Ht 64.0 in | Wt 174.0 lb

## 2022-05-08 DIAGNOSIS — F1920 Other psychoactive substance dependence, uncomplicated: Secondary | ICD-10-CM | POA: Diagnosis not present

## 2022-05-08 DIAGNOSIS — Z79899 Other long term (current) drug therapy: Secondary | ICD-10-CM | POA: Diagnosis not present

## 2022-05-08 DIAGNOSIS — J449 Chronic obstructive pulmonary disease, unspecified: Secondary | ICD-10-CM | POA: Diagnosis not present

## 2022-05-08 DIAGNOSIS — J9611 Chronic respiratory failure with hypoxia: Secondary | ICD-10-CM

## 2022-05-08 MED ORDER — AZITHROMYCIN 250 MG PO TABS: 250.0000 mg | ORAL_TABLET | ORAL | 3 refills | Status: AC

## 2022-05-08 NOTE — Progress Notes (Signed)
Synopsis: Referred in June 2021 for abnormal PET scan, Dr. Chase Caller, PCP: By [name], MD  Subjective:   PATIENT ID: Brandy Morales GENDER: female DOB: 28-Jul-1945, MRN: [id number]  Chief Complaint  Patient presents with   Follow-up    Copd     This is a 76 year old female past medical history of severe COPD, chronic oxygen dependent respiratory failure on 2 L pulse POC, asthma, hypertension.  Patient had CT scan imaging which revealed a right upper lobe pulmonary nodule abutting the major fissure.  Patient underwent nuclear medicine pet imaging which revealed a PET avid right upper lobe pulmonary nodule SUV 5.4.  Patient was referred for evaluation of navigational bronchoscopy and tissue sampling.  Patient felt to be not a good candidate for surgical resection due to poor lung function and functional status.  This was discussed with Dr. Chase Caller who is patient's primary pulmonologist.  OV 04/10/2021: I originally saw the patient in June 2021 for nodule consultation.  She was set up for bronchoscopy however declined due to concern for general anesthesia and ultimately sought radiation oncology in August 2021 and completed 54 Gray, 3 fractions of empiric SBRT to this highly suspicious nodule.  No tissue diagnosis was obtained.  Overall from a respiratory standpoint she is doing well.  She is using her portable oxygen.  She does feel like she may have had some decline in her functional status after hospitalization.  But we have not seen her in greater than a year.  She has been trying to stay at home is much as possible.  OV 03/19/2022: Patient here today for COPD follow-up.  She has chronic hypoxemic respiratory failure.  Currently on 2 L oxygen.  I initially saw her for a suspicious nodule.  She has had exacerbations requiring antibiotics and steroids.  She has been off and on steroids and predominantly on steroids at least for the past 3 months.  She is now down to 10 mg of prednisone per  day.  She is recently struggling after hearing her grandson was shot and killed as an Garment/textile technologist in [place].  She is feeling somewhat better but she understands that she has had some progressive decline over the past couple of weeks.  OV 05/08/2022: Here today for follow-up for COPD management.  She has chronic hypoxemic respiratory failure on 2 L.  Stable.  She has been on and off antibiotics and steroids.  Was recently started on a slow prednisone taper.  She is down to 5 mg daily.  She is feeling better.  Would like to continue to wean down on prednisone.  She only took azithromycin for couple of weeks.  The prescription ran out and she missed her follow-up to see [place] due to me having to read schedule.      Past Medical History:  Diagnosis Date   Anxiety    Asthma    Cancer ([place])    COPD (chronic obstructive pulmonary disease) ([place])    GERD (gastroesophageal reflux disease)    History of kidney stones    History of radiation therapy 01/05/2020   IMRT right lung 54 Gy in 3 Fractions 12/29/2019 through 01/05/2020  Dr Gery Pray   Hypertension      No family history on file.   Past Surgical History:  Procedure Laterality Date   ABDOMINAL HYSTERECTOMY  age 59   partial 1 ovary left   APPENDECTOMY     colonscopy     CYSTOSCOPY W/ URETERAL STENT PLACEMENT Left 12/30/2020  Procedure: CYSTOSCOPY WITH RETROGRADE PYELOGRAM/URETERAL STENT PLACEMENT;  Surgeon: Cleon Gustin, MD;  Location: [facility];  Service: Urology;  Laterality: Left;   CYSTOSCOPY WITH RETROGRADE PYELOGRAM, URETEROSCOPY AND STENT PLACEMENT N/A 11/30/2017   Procedure: CYSTOSCOPY WITH BILATERAL RETROGRADE PYELOGRAM, LEFT URETEROSCOPY/ LEFT LASER LITHO AND LEFT STENT PLACEMENT;  Surgeon: Lucas Mallow, MD;  Location: [facility];  Service: Urology;  Laterality: N/A;   CYSTOSCOPY WITH RETROGRADE PYELOGRAM, URETEROSCOPY AND STENT PLACEMENT Left 02/01/2018   Procedure: CYSTOSCOPY WITH LEFT RETROGRADE PYELOGRAM, URETEROSCOPY HOLMIUM  LASER AND STENT PLACEMENT;  Surgeon: Lucas Mallow, MD;  Location: [facility];  Service: Urology;  Laterality: Left;   EXTRACORPOREAL SHOCK WAVE LITHOTRIPSY Left 02/07/2021   Procedure: LEFT EXTRACORPOREAL SHOCK WAVE LITHOTRIPSY (ESWL);  Surgeon: Lucas Mallow, MD;  Location: [facility];  Service: Urology;  Laterality: Left;  67 MINS FOR THIS CASE NEED CRNA ON TRUCK  PLEASE CLOSE ROOM 2 AT 11:15 FOR  THIS CASE   EYE SURGERY Bilateral    Cataract   TONSILLECTOMY      Social History   Socioeconomic History   Marital status: Widowed    Spouse name: Not on file   Number of children: Not on file   Years of education: Not on file   Highest education level: Not on file  Occupational History   Not on file  Tobacco Use   Smoking status: Former    Packs/day: 2.00    Years: 55.00    Total pack years: 110.00    Types: Cigarettes    Quit date: 2016    Years since quitting: 7.9   Smokeless tobacco: Never  Vaping Use   Vaping Use: Never used  Substance and Sexual Activity   Alcohol use: Yes    Alcohol/week: 1.0 standard drink of alcohol    Types: 1 Glasses of wine per week    Comment: weekly   Drug use: Never   Sexual activity: Not Currently  Other Topics Concern   Not on file  Social History Narrative   Not on file   Social Determinants of Health   Financial Resource Strain: Not on file  Food Insecurity: Not on file  Transportation Needs: Not on file  Physical Activity: Not on file  Stress: Not on file  Social Connections: Not on file  Intimate Partner Violence: Not on file     Allergies  Allergen Reactions   Statins Cough   Latex Other (See Comments)    Broke out on face   Morphine And Related Other (See Comments)    Describes: "feel like I lose control of my body."   Oxycodone Other (See Comments)    Describes: "feels like I lose control of my body"     Outpatient Medications Prior to Visit  Medication Sig Dispense Refill   albuterol  (ACCUNEB) 1.25 MG/3ML nebulizer solution Take 1 ampule by nebulization every 6 (six) hours as needed for wheezing.     albuterol (VENTOLIN HFA) 108 (90 Base) MCG/ACT inhaler Inhale 2 puffs into the lungs every 6 (six) hours as needed for wheezing or shortness of breath.     BREZTRI AEROSPHERE 160-9-4.8 MCG/ACT AERO INHALE TWO PUFFS BY MOUTH INTO LUNGS EVERY MORNING AND AT BEDTIME 10.7 g 4   cetirizine (ZYRTEC) 10 MG tablet Take 10 mg by mouth daily.     fluticasone (FLONASE) 50 MCG/ACT nasal spray Place 1 spray into both nostrils daily.      OXYGEN Inhale 2 L  into the lungs continuous.      predniSONE (DELTASONE) 5 MG tablet Take 1 tablet (5 mg total) by mouth daily with breakfast. 60 tablet 0   Artificial Tear Solution (GENTEAL TEARS OP) Place 1 drop into both eyes daily.      atorvastatin (LIPITOR) 10 MG tablet Take 5 mg by mouth 3 (three) times a week. Take on MWF     azithromycin (ZITHROMAX) 250 MG tablet Take 1 tablet (250 mg total) by mouth every Monday, Wednesday, and Friday. (Patient not taking: Reported on 05/08/2022) 6 tablet 0   Cholecalciferol (VITAMIN D3) 5000 units CAPS Take 5,000 Units by mouth daily.     citalopram (CELEXA) 20 MG tablet Take 20 mg by mouth daily.     ezetimibe (ZETIA) 10 MG tablet Take 10 mg by mouth at bedtime.     losartan-hydrochlorothiazide (HYZAAR) 100-25 MG tablet Take 0.5 tablets by mouth daily.     Multiple Vitamin (MULTIVITAMIN) tablet Take 1 tablet by mouth daily.     omeprazole (PRILOSEC) 20 MG capsule Take 20 mg by mouth daily.      predniSONE (DELTASONE) 10 MG tablet Take 20mg  (2 tabs) for 2 days, then take 10mg  (1 tab) for 5 days, then stop. (Patient not taking: Reported on 05/08/2022) 9 tablet 0   No facility-administered medications prior to visit.    Review of Systems  Constitutional:  Negative for chills, fever, malaise/fatigue and weight loss.  HENT:  Negative for hearing loss, sore throat and tinnitus.   Eyes:  Negative for blurred vision  and double vision.  Respiratory:  Positive for shortness of breath. Negative for cough, hemoptysis, sputum production, wheezing and stridor.   Cardiovascular:  Negative for chest pain, palpitations, orthopnea, leg swelling and PND.  Gastrointestinal:  Negative for abdominal pain, constipation, diarrhea, heartburn, nausea and vomiting.  Genitourinary:  Negative for dysuria, hematuria and urgency.  Musculoskeletal:  Negative for joint pain and myalgias.  Skin:  Negative for itching and rash.  Neurological:  Negative for dizziness, tingling, weakness and headaches.  Endo/Heme/Allergies:  Negative for environmental allergies. Does not bruise/bleed easily.  Psychiatric/Behavioral:  Negative for depression. The patient is not nervous/anxious and does not have insomnia.   All other systems reviewed and are negative.    Objective:  Physical Exam Vitals reviewed.  Constitutional:      General: She is not in acute distress.    Appearance: She is well-developed.  HENT:     Head: Normocephalic and atraumatic.     Mouth/Throat:     Pharynx: No oropharyngeal exudate.  Eyes:     Conjunctiva/sclera: Conjunctivae normal.     Pupils: Pupils are equal, round, and reactive to light.  Neck:     Vascular: No JVD.     Trachea: No tracheal deviation.     Comments: Loss of supraclavicular fat Cardiovascular:     Rate and Rhythm: Normal rate and regular rhythm.     Heart sounds: S1 normal and S2 normal.     Comments: Distant heart tones Pulmonary:     Effort: No tachypnea or accessory muscle usage.     Breath sounds: No stridor. Decreased breath sounds (throughout all lung fields) present. No wheezing, rhonchi or rales.     Comments: Severely diminished breath sounds bilaterally Abdominal:     General: Bowel sounds are normal. There is no distension.     Palpations: Abdomen is soft.     Tenderness: There is no abdominal tenderness.  Musculoskeletal:  General: No deformity (muscle wasting ).   Skin:    General: Skin is warm and dry.     Capillary Refill: Capillary refill takes less than 2 seconds.     Findings: No rash.  Neurological:     Mental Status: She is alert and oriented to person, place, and time.  Psychiatric:        Behavior: Behavior normal.      Vitals:   05/08/22 1034  BP: 100/70  Pulse: (!) 103  SpO2: 94%  Weight: 174 lb (78.9 kg)  Height: 5\' 4"  (1.626 m)    94% on  RA BMI Readings from Last 3 Encounters:  05/08/22 29.87 kg/m  03/19/22 29.97 kg/m  02/20/22 29.93 kg/m   Wt Readings from Last 3 Encounters:  05/08/22 174 lb (78.9 kg)  03/19/22 174 lb 9.6 oz (79.2 kg)  02/20/22 174 lb 6.1 oz (79.1 kg)     CBC    Component Value Date/Time   WBC 8.5 01/02/2021 0503   RBC 4.40 01/02/2021 0503   RBC 4.32 01/02/2021 0503   HGB 12.3 01/02/2021 0503   HCT 39.5 01/02/2021 0503   PLT 145 (L) 01/02/2021 0503   MCV 91.4 01/02/2021 0503   MCH 28.5 01/02/2021 0503   MCHC 31.1 01/02/2021 0503   RDW 14.3 01/02/2021 0503   LYMPHSABS 0.5 (L) 12/29/2020 0657   MONOABS 0.8 12/29/2020 0657   EOSABS 0.0 12/29/2020 0657   BASOSABS 0.0 12/29/2020 0657     Chest Imaging: 09/14/2019 CT chest: Right upper lobe spiculated pulmonary nodule abutting major fissure. The patient's images have been independently reviewed by me.    10/12/2019 nuclear medicine pet imaging. Right upper lobe PET avid pulmonary nodule SUV 5.45, 11 mm in largest cross-section, mild tenting of the fissure line. Concerning for primary bronchogenic carcinoma. The patient's images have been independently reviewed by me.    11/30/2020 CT chest: Treated nodule right upper lobe decreased size post radiation treatments Enlarging 7 mm peribronchovascular nodule in the left lower lobe. The patient's images have been independently reviewed by me.    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  Increased ill-defined groundglass nodules in the left upper lobe.  Likely inflammatory. Evidence of emphysema. The patient's images have been independently reviewed by me.     Pulmonary Functions Testing Results:    Latest Ref Rng & Units 05/14/2017   10:49 AM  PFT Results  FVC-Pre L 1.50   FVC-Predicted Pre % 48   Pre FEV1/FVC % % 42   FEV1-Pre L 0.63   FEV1-Predicted Pre % 26   DLCO uncorrected ml/min/mmHg 9.57   DLCO UNC% % 37   DLCO corrected ml/min/mmHg 9.09   DLCO COR %Predicted % 35   DLVA Predicted % 45   TLC L 9.98   TLC % Predicted % 192   RV % Predicted % 366     FeNO: none  Pathology: none  Echocardiogram: none  Heart Catheterization: none    Assessment & Plan:     ICD-10-CM   1. Stage 4 very severe COPD by GOLD classification ([place])  J44.9     2. Chronic respiratory failure with hypoxia ([facility])  J96.11     3. Medication management  Z79.899     4. 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  She has been hopefully continue to wean from steroid dependence.  Plan: Drop prednisone to 2.5 mg daily Restart azithromycin 250 mg Monday Wednesday Friday Continue Breztri triple therapy inhaler regimen Repeat EKG in 4 weeks with appointment with APP to ensure QTc stability.    Current Outpatient Medications:    albuterol (ACCUNEB) 1.25 MG/3ML nebulizer solution, Take 1 ampule by nebulization every 6 (six) hours as needed for wheezing., Disp: , Rfl:    albuterol (VENTOLIN HFA) 108 (90 Base) MCG/ACT inhaler, Inhale 2 puffs into the lungs every 6 (six) hours as needed for wheezing or shortness of breath., Disp: , Rfl:    [START ON 05/09/2022] azithromycin (ZITHROMAX) 250 MG tablet, Take 1 tablet (250 mg total) by mouth every Monday, Wednesday, and Friday., Disp: 36 tablet, Rfl: 3   BREZTRI  AEROSPHERE 160-9-4.8 MCG/ACT AERO, INHALE TWO PUFFS BY MOUTH INTO LUNGS EVERY MORNING AND AT BEDTIME, Disp: 10.7 g, Rfl: 4   cetirizine (ZYRTEC) 10 MG tablet, Take 10 mg by mouth daily., Disp: , Rfl:    fluticasone (FLONASE) 50 MCG/ACT nasal spray, Place 1 spray into both nostrils daily. , Disp: , Rfl:    OXYGEN, Inhale 2 L into the lungs continuous. , Disp: , Rfl:    predniSONE (DELTASONE) 5 MG tablet, Take 1 tablet (5 mg total) by mouth daily with breakfast., Disp: 60 tablet, Rfl: 0   Artificial Tear Solution (GENTEAL TEARS OP), Place 1 drop into both eyes daily. , Disp: , Rfl:    atorvastatin (LIPITOR) 10 MG tablet, Take 5 mg by mouth 3 (three) times a week. Take on MWF, Disp: , Rfl:    azithromycin (ZITHROMAX) 250 MG tablet, Take 1 tablet (250 mg total) by mouth every Monday, Wednesday, and Friday. (Patient not taking: Reported on 05/08/2022), Disp: 6 tablet, Rfl: 0   Cholecalciferol (VITAMIN D3) 5000 units CAPS, Take 5,000 Units by mouth daily., Disp: , Rfl:    citalopram (CELEXA) 20 MG tablet, Take 20 mg by mouth daily., Disp: , Rfl:    ezetimibe (ZETIA) 10 MG tablet, Take 10 mg by mouth at bedtime., Disp: , Rfl:    losartan-hydrochlorothiazide (HYZAAR) 100-25 MG tablet, Take 0.5 tablets by mouth daily., Disp: , Rfl:    Multiple Vitamin (MULTIVITAMIN) tablet, Take 1 tablet by mouth daily., Disp: , Rfl:    omeprazole (PRILOSEC) 20 MG capsule, Take 20 mg by mouth daily. , Disp: , Rfl:    predniSONE (DELTASONE) 10 MG tablet, Take 20mg  (2 tabs) for 2 days, then take 10mg  (1 tab) for 5 days, then stop. (Patient not taking: Reported on 05/08/2022), Disp: 9 tablet, Rfl: 0   Garner Nash, DO [facility] 05/08/2022 11:11 AM

## 2022-05-08 NOTE — Patient Instructions (Signed)
Thank you for visiting Dr. Valeta Harms at [facility]. Today we recommend the following:  Decrease prednisone to 2.5mg  daily  Continue breztri   Meds ordered this encounter  Medications   azithromycin (ZITHROMAX) 250 MG tablet    Sig: Take 1 tablet (250 mg total) by mouth every Monday, Wednesday, and Friday.    Dispense:  36 tablet    Refill:  3   Return in about 4 weeks (around 06/05/2022) for with APP.    Please do your part to reduce the spread of COVID-19.

## 2022-05-30 ENCOUNTER — Telehealth: Payer: Self-pay | Admitting: *Deleted

## 2022-05-30 NOTE — Telephone Encounter (Signed)
Called patient to inform of Ct for 06-13-22- arrival time- 3:15 pm @ [facility], no restrictions to test, patient to receive results from Dr. Sondra Come on 06-16-22 @ 10 am, spoke with patient and she is aware of these appts. and the instructions

## 2022-06-04 DIAGNOSIS — J9611 Chronic respiratory failure with hypoxia: Secondary | ICD-10-CM | POA: Diagnosis not present

## 2022-06-04 DIAGNOSIS — D4100 Neoplasm of uncertain behavior of unspecified kidney: Secondary | ICD-10-CM | POA: Diagnosis not present

## 2022-06-04 DIAGNOSIS — C3490 Malignant neoplasm of unspecified part of unspecified bronchus or lung: Secondary | ICD-10-CM | POA: Diagnosis not present

## 2022-06-04 DIAGNOSIS — R399 Unspecified symptoms and signs involving the genitourinary system: Secondary | ICD-10-CM | POA: Diagnosis not present

## 2022-06-04 DIAGNOSIS — I700 Atherosclerosis of aorta: Secondary | ICD-10-CM | POA: Diagnosis not present

## 2022-06-04 DIAGNOSIS — J449 Chronic obstructive pulmonary disease, unspecified: Secondary | ICD-10-CM | POA: Diagnosis not present

## 2022-06-04 DIAGNOSIS — I10 Essential (primary) hypertension: Secondary | ICD-10-CM | POA: Diagnosis not present

## 2022-06-04 DIAGNOSIS — R5382 Chronic fatigue, unspecified: Secondary | ICD-10-CM | POA: Diagnosis not present

## 2022-06-04 DIAGNOSIS — K5730 Diverticulosis of large intestine without perforation or abscess without bleeding: Secondary | ICD-10-CM | POA: Diagnosis not present

## 2022-06-04 DIAGNOSIS — K219 Gastro-esophageal reflux disease without esophagitis: Secondary | ICD-10-CM | POA: Diagnosis not present

## 2022-06-04 DIAGNOSIS — E559 Vitamin D deficiency, unspecified: Secondary | ICD-10-CM | POA: Diagnosis not present

## 2022-06-04 DIAGNOSIS — H34239 Retinal artery branch occlusion, unspecified eye: Secondary | ICD-10-CM | POA: Diagnosis not present

## 2022-06-04 DIAGNOSIS — E7800 Pure hypercholesterolemia, unspecified: Secondary | ICD-10-CM | POA: Diagnosis not present

## 2022-06-05 ENCOUNTER — Ambulatory Visit: Payer: PPO | Admitting: Adult Health

## 2022-06-09 ENCOUNTER — Encounter: Payer: Self-pay | Admitting: Pulmonary Disease

## 2022-06-09 ENCOUNTER — Ambulatory Visit (INDEPENDENT_AMBULATORY_CARE_PROVIDER_SITE_OTHER): Payer: PPO | Admitting: Pulmonary Disease

## 2022-06-09 VITALS — BP 130/90 | HR 84 | Ht 64.0 in | Wt 176.4 lb

## 2022-06-09 DIAGNOSIS — F1920 Other psychoactive substance dependence, uncomplicated: Secondary | ICD-10-CM | POA: Diagnosis not present

## 2022-06-09 DIAGNOSIS — J9611 Chronic respiratory failure with hypoxia: Secondary | ICD-10-CM

## 2022-06-09 DIAGNOSIS — Z79899 Other long term (current) drug therapy: Secondary | ICD-10-CM

## 2022-06-09 DIAGNOSIS — J449 Chronic obstructive pulmonary disease, unspecified: Secondary | ICD-10-CM

## 2022-06-09 NOTE — Patient Instructions (Signed)
Thank you for visiting Dr. Tonia Brooms at [facility]. Today we recommend the following:  Orders Placed This Encounter  Procedures   EKG 12-Lead   Stop prednisone   Continue azithromycin 250mg  MWF Continue breztri   Return in about 3 months (around 09/08/2022) for w/ Dr. 09/10/2022 .    Please do your part to reduce the spread of COVID-19.

## 2022-06-09 NOTE — Progress Notes (Signed)
Synopsis: Referred in June 2021 for abnormal PET scan, Dr. Chase Caller, PCP: By [name], MD  Subjective:   PATIENT ID: Brandy Morales GENDER: female DOB: [birth year], MRN: [id number]  Chief Complaint  Patient presents with   Follow-up    This is a 77 year old female past medical history of severe COPD, chronic oxygen dependent respiratory failure on 2 L pulse POC, asthma, hypertension.  Patient had CT scan imaging which revealed a right upper lobe pulmonary nodule abutting the major fissure.  Patient underwent nuclear medicine pet imaging which revealed a PET avid right upper lobe pulmonary nodule SUV 5.4.  Patient was referred for evaluation of navigational bronchoscopy and tissue sampling.  Patient felt to be not a good candidate for surgical resection due to poor lung function and functional status.  This was discussed with Dr. Chase Caller who is patient's primary pulmonologist.  OV 04/10/2021: I originally saw the patient in June 2021 for nodule consultation.  She was set up for bronchoscopy however declined due to concern for general anesthesia and ultimately sought radiation oncology in August 2021 and completed 54 Gray, 3 fractions of empiric SBRT to this highly suspicious nodule.  No tissue diagnosis was obtained.  Overall from a respiratory standpoint she is doing well.  She is using her portable oxygen.  She does feel like she may have had some decline in her functional status after hospitalization.  But we have not seen her in greater than a year.  She has been trying to stay at home is much as possible.  OV 03/19/2022: Patient here today for COPD follow-up.  She has chronic hypoxemic respiratory failure.  Currently on 2 L oxygen.  I initially saw her for a suspicious nodule.  She has had exacerbations requiring antibiotics and steroids.  She has been off and on steroids and predominantly on steroids at least for the past 3 months.  She is now down to 10 mg of prednisone per day.  She  is recently struggling after hearing her grandson was shot and killed as an Garment/textile technologist in [place].  She is feeling somewhat better but she understands that she has had some progressive decline over the past couple of weeks.  OV 05/08/2022: Here today for follow-up for COPD management.  She has chronic hypoxemic respiratory failure on 2 L.  Stable.  She has been on and off antibiotics and steroids.  Was recently started on a slow prednisone taper.  She is down to 5 mg daily.  She is feeling better.  Would like to continue to wean down on prednisone.  She only took azithromycin for couple of weeks.  The prescription ran out and she missed her follow-up to see [place] due to me having to read schedule.  OV 06/09/2022: Here today for follow-up COPD management.  She is chronic hypoxemic respiratory failure on 2 L.  She is been on and off antibiotics and steroids for a very long time.  She is placed on a prolonged steroid taper.  Last time I saw her she was at 5 mg prednisone daily.  We weaned her down to 2.5 mg prednisone and placed her on azithromycin Monday Wednesday Friday.  She had a infection was treated with ciprofloxacin a few weeks ago and she came off of the azithromycin in the meantime.  She feels better she is in no respiratory symptoms at this point.      Past Medical History:  Diagnosis Date   Anxiety    Asthma    Cancer (  HCC)    COPD (chronic obstructive pulmonary disease) (HCC)    GERD (gastroesophageal reflux disease)    History of kidney stones    History of radiation therapy 01/05/2020   IMRT right lung 54 Gy in 3 Fractions 12/29/2019 through 01/05/2020  Dr Antony Blackbird   Hypertension      No family history on file.   Past Surgical History:  Procedure Laterality Date   ABDOMINAL HYSTERECTOMY  age 10   partial 1 ovary left   APPENDECTOMY     colonscopy     CYSTOSCOPY W/ URETERAL STENT PLACEMENT Left 12/30/2020   Procedure: CYSTOSCOPY WITH RETROGRADE PYELOGRAM/URETERAL STENT PLACEMENT;   Surgeon: Malen Gauze, MD;  Location: [facility];  Service: Urology;  Laterality: Left;   CYSTOSCOPY WITH RETROGRADE PYELOGRAM, URETEROSCOPY AND STENT PLACEMENT N/A 11/30/2017   Procedure: CYSTOSCOPY WITH BILATERAL RETROGRADE PYELOGRAM, LEFT URETEROSCOPY/ LEFT LASER LITHO AND LEFT STENT PLACEMENT;  Surgeon: Crista Elliot, MD;  Location: [facility];  Service: Urology;  Laterality: N/A;   CYSTOSCOPY WITH RETROGRADE PYELOGRAM, URETEROSCOPY AND STENT PLACEMENT Left 02/01/2018   Procedure: CYSTOSCOPY WITH LEFT RETROGRADE PYELOGRAM, URETEROSCOPY HOLMIUM LASER AND STENT PLACEMENT;  Surgeon: Crista Elliot, MD;  Location: [facility];  Service: Urology;  Laterality: Left;   EXTRACORPOREAL SHOCK WAVE LITHOTRIPSY Left 02/07/2021   Procedure: LEFT EXTRACORPOREAL SHOCK WAVE LITHOTRIPSY (ESWL);  Surgeon: Crista Elliot, MD;  Location: [facility];  Service: Urology;  Laterality: Left;  90 MINS FOR THIS CASE NEED CRNA ON TRUCK  PLEASE CLOSE ROOM 2 AT 11:15 FOR  THIS CASE   EYE SURGERY Bilateral    Cataract   TONSILLECTOMY      Social History   Socioeconomic History   Marital status: Widowed    Spouse name: Not on file   Number of children: Not on file   Years of education: Not on file   Highest education level: Not on file  Occupational History   Not on file  Tobacco Use   Smoking status: Former    Packs/day: 2.00    Years: 55.00    Total pack years: 110.00    Types: Cigarettes    Quit date: 2016    Years since quitting: 8.0   Smokeless tobacco: Never  Vaping Use   Vaping Use: Never used  Substance and Sexual Activity   Alcohol use: Yes    Alcohol/week: 1.0 standard drink of alcohol    Types: 1 Glasses of wine per week    Comment: weekly   Drug use: Never   Sexual activity: Not Currently  Other Topics Concern   Not on file  Social History Narrative   Not on file   Social Determinants of Health   Financial Resource Strain: Not on file  Food Insecurity: Not on file   Transportation Needs: Not on file  Physical Activity: Not on file  Stress: Not on file  Social Connections: Not on file  Intimate Partner Violence: Not on file     Allergies  Allergen Reactions   Statins Cough   Latex Other (See Comments)    Broke out on face   Morphine And Related Other (See Comments)    Describes: "feel like I lose control of my body."   Oxycodone Other (See Comments)    Describes: "feels like I lose control of my body"     Outpatient Medications Prior to Visit  Medication Sig Dispense Refill   albuterol (ACCUNEB) 1.25 MG/3ML nebulizer solution Take  1 ampule by nebulization every 6 (six) hours as needed for wheezing.     albuterol (VENTOLIN HFA) 108 (90 Base) MCG/ACT inhaler Inhale 2 puffs into the lungs every 6 (six) hours as needed for wheezing or shortness of breath.     BREZTRI AEROSPHERE 160-9-4.8 MCG/ACT AERO INHALE TWO PUFFS BY MOUTH INTO LUNGS EVERY MORNING AND AT BEDTIME 10.7 g 4   cetirizine (ZYRTEC) 10 MG tablet Take 10 mg by mouth daily.     fluticasone (FLONASE) 50 MCG/ACT nasal spray Place 1 spray into both nostrils daily.      OXYGEN Inhale 2 L into the lungs continuous.      predniSONE (DELTASONE) 5 MG tablet Take 1 tablet (5 mg total) by mouth daily with breakfast. 60 tablet 0   Artificial Tear Solution (GENTEAL TEARS OP) Place 1 drop into both eyes daily.      atorvastatin (LIPITOR) 10 MG tablet Take 5 mg by mouth 3 (three) times a week. Take on MWF     azithromycin (ZITHROMAX) 250 MG tablet Take 1 tablet (250 mg total) by mouth every Monday, Wednesday, and Friday. (Patient not taking: Reported on 05/08/2022) 6 tablet 0   azithromycin (ZITHROMAX) 250 MG tablet Take 1 tablet (250 mg total) by mouth every Monday, Wednesday, and Friday. (Patient not taking: Reported on 06/09/2022) 36 tablet 3   Cholecalciferol (VITAMIN D3) 5000 units CAPS Take 5,000 Units by mouth daily.     citalopram (CELEXA) 20 MG tablet Take 20 mg by mouth daily.     ezetimibe  (ZETIA) 10 MG tablet Take 10 mg by mouth at bedtime.     losartan-hydrochlorothiazide (HYZAAR) 100-25 MG tablet Take 0.5 tablets by mouth daily.     Multiple Vitamin (MULTIVITAMIN) tablet Take 1 tablet by mouth daily.     omeprazole (PRILOSEC) 20 MG capsule Take 20 mg by mouth daily.      predniSONE (DELTASONE) 10 MG tablet Take 20mg  (2 tabs) for 2 days, then take 10mg  (1 tab) for 5 days, then stop. (Patient not taking: Reported on 05/08/2022) 9 tablet 0   No facility-administered medications prior to visit.    Review of Systems  Constitutional:  Negative for chills, fever, malaise/fatigue and weight loss.  HENT:  Negative for hearing loss, sore throat and tinnitus.   Eyes:  Negative for blurred vision and double vision.  Respiratory:  Positive for cough and shortness of breath. Negative for hemoptysis, sputum production, wheezing and stridor.   Cardiovascular:  Negative for chest pain, palpitations, orthopnea, leg swelling and PND.  Gastrointestinal:  Negative for abdominal pain, constipation, diarrhea, heartburn, nausea and vomiting.  Genitourinary:  Negative for dysuria, hematuria and urgency.  Musculoskeletal:  Negative for joint pain and myalgias.  Skin:  Negative for itching and rash.  Neurological:  Negative for dizziness, tingling, weakness and headaches.  Endo/Heme/Allergies:  Negative for environmental allergies. Does not bruise/bleed easily.  Psychiatric/Behavioral:  Negative for depression. The patient is not nervous/anxious and does not have insomnia.   All other systems reviewed and are negative.    Objective:  Physical Exam Vitals reviewed.  Constitutional:      General: She is not in acute distress.    Appearance: She is well-developed.  HENT:     Head: Normocephalic and atraumatic.     Mouth/Throat:     Pharynx: No oropharyngeal exudate.  Eyes:     General: No scleral icterus.    Conjunctiva/sclera: Conjunctivae normal.     Pupils: Pupils are equal, round, and  reactive to light.  Neck:     Vascular: No JVD.     Trachea: No tracheal deviation.     Comments: Loss of supraclavicular fat Cardiovascular:     Rate and Rhythm: Normal rate and regular rhythm.     Heart sounds: Normal heart sounds, S1 normal and S2 normal. No murmur heard.    Comments: Distant heart tones Pulmonary:     Effort: Pulmonary effort is normal. No tachypnea, accessory muscle usage or respiratory distress.     Breath sounds: No stridor. Decreased breath sounds (throughout all lung fields) present. No wheezing, rhonchi or rales.  Abdominal:     General: There is no distension.     Palpations: Abdomen is soft.     Tenderness: There is no abdominal tenderness.  Musculoskeletal:        General: Deformity (muscle wasting ) present. No tenderness.     Cervical back: Neck supple.  Lymphadenopathy:     Cervical: No cervical adenopathy.  Skin:    General: Skin is warm and dry.     Capillary Refill: Capillary refill takes less than 2 seconds.     Findings: No rash.  Neurological:     Mental Status: She is alert and oriented to person, place, and time.  Psychiatric:        Behavior: Behavior normal.      Vitals:   06/09/22 1543  BP: (!) 130/90  Pulse: 84  SpO2: 96%  Weight: 176 lb 6.4 oz (80 kg)  Height: 5\' 4"  (1.626 m)    96% on  RA BMI Readings from Last 3 Encounters:  06/09/22 30.28 kg/m  05/08/22 29.87 kg/m  03/19/22 29.97 kg/m   Wt Readings from Last 3 Encounters:  06/09/22 176 lb 6.4 oz (80 kg)  05/08/22 174 lb (78.9 kg)  03/19/22 174 lb 9.6 oz (79.2 kg)     CBC    Component Value Date/Time   WBC 8.5 01/02/2021 0503   RBC 4.40 01/02/2021 0503   RBC 4.32 01/02/2021 0503   HGB 12.3 01/02/2021 0503   HCT 39.5 01/02/2021 0503   PLT 145 (L) 01/02/2021 0503   MCV 91.4 01/02/2021 0503   MCH 28.5 01/02/2021 0503   MCHC 31.1 01/02/2021 0503   RDW 14.3 01/02/2021 0503   LYMPHSABS 0.5 (L) 12/29/2020 0657   MONOABS 0.8 12/29/2020 0657   EOSABS 0.0  12/29/2020 0657   BASOSABS 0.0 12/29/2020 0657     Chest Imaging: 09/14/2019 CT chest: Right upper lobe spiculated pulmonary nodule abutting major fissure. The patient's images have been independently reviewed by me.    10/12/2019 nuclear medicine pet imaging. Right upper lobe PET avid pulmonary nodule SUV 5.45, 11 mm in largest cross-section, mild tenting of the fissure line. Concerning for primary bronchogenic carcinoma. The patient's images have been independently reviewed by me.    11/30/2020 CT chest: Treated nodule right upper lobe decreased size post radiation treatments Enlarging 7 mm peribronchovascular nodule in the left lower lobe. The patient's images have been independently reviewed by me.    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  Increased ill-defined groundglass nodules in the left upper lobe.  Likely inflammatory. Evidence of emphysema. The patient's images have been independently reviewed by me.     Pulmonary Functions Testing Results:    Latest Ref Rng & Units 05/14/2017   10:49 AM  PFT Results  FVC-Pre L 1.50   FVC-Predicted Pre %  48   Pre FEV1/FVC % % 42   FEV1-Pre L 0.63   FEV1-Predicted Pre % 26   DLCO uncorrected ml/min/mmHg 9.57   DLCO UNC% % 37   DLCO corrected ml/min/mmHg 9.09   DLCO COR %Predicted % 35   DLVA Predicted % 45   TLC L 9.98   TLC % Predicted % 192   RV % Predicted % 366     FeNO: none  Pathology: none  Echocardiogram: none  Heart Catheterization: none    Assessment & Plan:     ICD-10-CM   1. Medication management  Z79.899 EKG 12-Lead    2. Stage 4 very severe COPD by GOLD classification ([place])  J44.9     3. Chronic respiratory failure with hypoxia (HCC)  J96.11     4. 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  Plan: Stop prednisone Continue azithromycin 250 mg Monday Wednesday Friday Continue triple therapy inhaler regimen with Breztri Repeat ECG today with Qtc 406, ok to continue meds  RTC in 3 months     Current Outpatient Medications:    albuterol (ACCUNEB) 1.25 MG/3ML nebulizer solution, Take 1 ampule by nebulization every 6 (six) hours as needed for wheezing., Disp: , Rfl:    albuterol (VENTOLIN HFA) 108 (90 Base) MCG/ACT inhaler, Inhale 2 puffs into the lungs every 6 (six) hours as needed for wheezing or shortness of breath., Disp: , Rfl:    BREZTRI AEROSPHERE 160-9-4.8 MCG/ACT AERO, INHALE TWO PUFFS BY MOUTH INTO LUNGS EVERY MORNING AND AT BEDTIME, Disp: 10.7 g, Rfl: 4   cetirizine (ZYRTEC) 10 MG tablet, Take 10 mg by mouth daily., Disp: , Rfl:    fluticasone (FLONASE) 50 MCG/ACT nasal spray, Place 1 spray into both nostrils daily. , Disp: , Rfl:    OXYGEN, Inhale 2 L into the lungs continuous. , Disp: , Rfl:    predniSONE (DELTASONE) 5 MG tablet, Take 1 tablet (5 mg total) by mouth daily with breakfast., Disp: 60 tablet, Rfl: 0   Artificial Tear Solution (GENTEAL TEARS OP), Place 1 drop into both eyes daily. , Disp: , Rfl:    atorvastatin (LIPITOR) 10 MG tablet, Take 5 mg by mouth 3 (three) times a week. Take on MWF, Disp: , Rfl:    azithromycin (ZITHROMAX) 250 MG tablet, Take 1 tablet (250 mg total) by mouth every Monday, Wednesday, and Friday. (Patient not taking: Reported on 05/08/2022), Disp: 6 tablet, Rfl: 0   azithromycin (ZITHROMAX) 250 MG tablet, Take 1 tablet (250 mg total) by mouth every Monday, Wednesday, and Friday. (Patient not taking: Reported on 06/09/2022), Disp: 36 tablet, Rfl: 3   Cholecalciferol (VITAMIN D3) 5000 units CAPS, Take 5,000 Units by mouth daily., Disp: , Rfl:    citalopram (CELEXA) 20 MG tablet, Take 20 mg by mouth daily., Disp: , Rfl:    ezetimibe (ZETIA) 10 MG  tablet, Take 10 mg by mouth at bedtime., Disp: , Rfl:    losartan-hydrochlorothiazide (HYZAAR) 100-25 MG tablet, Take 0.5 tablets by mouth daily., Disp: , Rfl:    Multiple Vitamin (MULTIVITAMIN) tablet, Take 1 tablet by mouth daily., Disp: , Rfl:    omeprazole (PRILOSEC) 20 MG capsule, Take 20 mg by mouth daily. , Disp: , Rfl:    predniSONE (DELTASONE) 10 MG tablet, Take 20mg  (2 tabs) for 2 days,  then take 10mg  (1 tab) for 5 days, then stop. (Patient not taking: Reported on 05/08/2022), Disp: 9 tablet, Rfl: 0   05/10/2022, DO [facility] 06/09/2022 4:06 PM

## 2022-06-12 NOTE — Progress Notes (Incomplete)
Brandy Morales is here today for follow up post radiation to the lung.  Lung Side: rt lung last treatment 01/05/20  Does the patient complain of any of the following: Pain:*** Shortness of breath w/wo exertion: *** Cough: *** Hemoptysis: *** Pain with swallowing: *** Swallowing/choking concerns: *** Appetite: *** Energy Level: *** Post radiation skin Changes: ***    Additional comments if applicable:

## 2022-06-13 ENCOUNTER — Telehealth: Payer: Self-pay | Admitting: *Deleted

## 2022-06-13 ENCOUNTER — Ambulatory Visit (HOSPITAL_COMMUNITY): Payer: PPO

## 2022-06-13 NOTE — Telephone Encounter (Signed)
RETURNED PATIENT'S PHONE CALL, SPOKE WITH PATIENT AND RESCHEDULED FU FOR 06-13-22 TO 06-26-22

## 2022-06-16 ENCOUNTER — Ambulatory Visit
Admission: RE | Admit: 2022-06-16 | Discharge: 2022-06-16 | Disposition: A | Discharge status: Home / Self Care | Payer: PPO | Source: Ambulatory Visit | Attending: Radiation Oncology | Admitting: Radiation Oncology

## 2022-06-16 DIAGNOSIS — R918 Other nonspecific abnormal finding of lung field: Secondary | ICD-10-CM

## 2022-06-24 ENCOUNTER — Ambulatory Visit (HOSPITAL_COMMUNITY)
Admission: RE | Admit: 2022-06-24 | Discharge: 2022-06-24 | Disposition: A | Discharge status: Home / Self Care | Payer: PPO | Source: Ambulatory Visit | Attending: Radiation Oncology | Admitting: Radiation Oncology

## 2022-06-24 ENCOUNTER — Encounter (HOSPITAL_COMMUNITY): Payer: Self-pay

## 2022-06-24 DIAGNOSIS — R918 Other nonspecific abnormal finding of lung field: Secondary | ICD-10-CM | POA: Insufficient documentation

## 2022-06-24 DIAGNOSIS — J439 Emphysema, unspecified: Secondary | ICD-10-CM | POA: Diagnosis not present

## 2022-06-24 DIAGNOSIS — J479 Bronchiectasis, uncomplicated: Secondary | ICD-10-CM | POA: Diagnosis not present

## 2022-06-24 DIAGNOSIS — C3490 Malignant neoplasm of unspecified part of unspecified bronchus or lung: Secondary | ICD-10-CM | POA: Diagnosis not present

## 2022-06-25 NOTE — Progress Notes (Signed)
Radiation Oncology         [phone] ________________________________  Name: Brandy Morales MRN: [id number]  Date: 06/26/2022  DOB: May 21, 1946  Follow-Up Visit Note  CC: Scheryl Marten, [place]  Brand Males, MD    ICD-10-CM   1. Pulmonary nodules  R91.8 CT Chest Wo Contrast      Diagnosis: PET avid right upper lobe spiculated pulmonary nodule concerning for primary bronchogenic carcinoma    Interval Since Last Radiation: 2 years, 5 months, and 20 days   Radiation Treatment Dates: 12/29/2019 through 01/05/2020 Site Technique Total Dose (Gy) Dose per Fx (Gy) Completed Fx Beam Energies  Lung, Right: Lung_Rt SBRT 54/54 18 3/3 6XFFF   Narrative:  The patient returns today for routine follow-up and to review recent imaging. She was last seen here for follow-up on 12/12/21. Since her last visit, the patient began attending pulmonary rehab on 12/27/21. Not long after this, the patient presented to Dr. Lake Bells at [facility] on 01/08/22 with increased SOB and wheezing x 1 week. She was ultimately treated for a mild COPD exacerbation with a 5 day course of prednisone and doxycycline. Chest x-ray performed on this date showed no active cardiopulmonary disease and no significant interval change of the linear markings in the right upper lobe seen on her previous CT.         In the following months, the patient experienced several more exacerbations and she was subsequently on and off of steroids during that period of time. During a follow-up visit with Dr. Valeta Harms on 03/19/22, the patient was noted to be on 10 mg of prednisone at that time which seemed to work well for her. In the setting of her practically steroid-dependent disease at the time, Dr. Valeta Harms started her on a slow prednisone taper, and transitioned her down to 5 mg daily. He also started her on 250 mg of azithromycin. During her follow-up visit with Dr. Valeta Harms in December, the patient reported feeling well and she was able to  transition to 2.5 mg of prednisone. She was also restarted on azithromycin given that she did not fully complete her last course. At her most recent follow-up visit with Dr. Valeta Harms on 06/09/21, the patient was cleared to fully stop prednisone. She will continue on azithromycin at 250 mg (taken on Monday's, Wednesday's, and Friday's).         Her most recent chest CT without contrast on 06/24/22 demonstrates: a stable distribution of focal lung nodularity; a slight increase in size of the central solid component in the dominant nodule in the right lung; and a subtle increase in size of the central nodular component in the small RUL nodule.                   Patient endorses worsening shortness of breath with exertion in the last 6 months. She is on 2L of oxygen at all times. She denies any productive cough hemoptysis, chest pain, or unexpected weight loss.   Allergies:  is allergic to statins, latex, morphine and related, and oxycodone.  Meds: Current Outpatient Medications  Medication Sig Dispense Refill   losartan (COZAAR) 25 MG tablet Take 25 mg by mouth daily.     albuterol (ACCUNEB) 1.25 MG/3ML nebulizer solution Take 1 ampule by nebulization every 6 (six) hours as needed for wheezing.     albuterol (VENTOLIN HFA) 108 (90 Base) MCG/ACT inhaler Inhale 2 puffs into the lungs every 6 (six) hours as needed for wheezing or shortness of breath.  Artificial Tear Solution (GENTEAL TEARS OP) Place 1 drop into both eyes daily.      atorvastatin (LIPITOR) 10 MG tablet Take 5 mg by mouth 3 (three) times a week. Take on MWF     azithromycin (ZITHROMAX) 250 MG tablet Take 1 tablet (250 mg total) by mouth every Monday, Wednesday, and Friday. (Patient not taking: Reported on 06/09/2022) 36 tablet 3   BREZTRI AEROSPHERE 160-9-4.8 MCG/ACT AERO INHALE TWO PUFFS BY MOUTH INTO LUNGS EVERY MORNING AND AT BEDTIME 10.7 g 4   cetirizine (ZYRTEC) 10 MG tablet Take 10 mg by mouth daily.     Cholecalciferol (VITAMIN D3)  5000 units CAPS Take 5,000 Units by mouth daily.     citalopram (CELEXA) 20 MG tablet Take 20 mg by mouth daily.     ezetimibe (ZETIA) 10 MG tablet Take 10 mg by mouth at bedtime.     fluticasone (FLONASE) 50 MCG/ACT nasal spray Place 1 spray into both nostrils daily.      Multiple Vitamin (MULTIVITAMIN) tablet Take 1 tablet by mouth daily.     omeprazole (PRILOSEC) 20 MG capsule Take 20 mg by mouth daily.      OXYGEN Inhale 2 L into the lungs continuous.      No current facility-administered medications for this encounter.    Physical Findings: The patient is in no acute distress. Patient is alert and oriented.  height is 5\' 4"  (1.626 m) and weight is 176 lb 9.6 oz (80.1 kg). Her temperature is 97.6 F (36.4 C). Her blood pressure is 111/61 and her pulse is 95. Her respiration is 24 (abnormal) and oxygen saturation is 96%. .  . Lungs are clear to auscultation bilaterally. Heart has regular rate and rhythm. No palpable cervical, supraclavicular, or axillary adenopathy. Abdomen soft, non-tender, normal bowel sounds.   Lab Findings: Lab Results  Component Value Date   WBC 8.5 01/02/2021   HGB 12.3 01/02/2021   HCT 39.5 01/02/2021   MCV 91.4 01/02/2021   PLT 145 (L) 01/02/2021    Radiographic Findings: CT CHEST WO CONTRAST  Result Date: 06/24/2022 CLINICAL DATA:  Assess treatment response of non-small-cell lung cancer. * Tracking Code: BO * EXAM: CT CHEST WITHOUT CONTRAST TECHNIQUE: Multidetector CT imaging of the chest was performed following the standard protocol without IV contrast. RADIATION DOSE REDUCTION: This exam was performed according to the departmental dose-optimization program which includes automated exposure control, adjustment of the mA and/or kV according to patient size and/or use of iterative reconstruction technique. COMPARISON:  X-ray 01/08/2022.  CT 12/06/2021 and older FINDINGS: Cardiovascular: Heart is nonenlarged. Trace pericardial fluid or thickening. Coronary  artery calcifications are seen. The thoracic aorta has some scattered atherosclerotic calcified plaque. Overall normal course and caliber on this noncontrast exam. There is some mild calcified plaque extending along the origin of the great vessels. Mediastinum/Nodes: Small thyroid gland. Normal caliber thoracic esophagus. No specific abnormal lymph node enlargement identified in the axillary region, hilum or mediastinum. Lungs/Pleura: Centrilobular emphysematous lung changes are identified. There are some areas of bronchiectasis scattered in both lungs particularly along the lower lobes, middle lobe and lingula. Some areas as well along the inferior medial right upper lobe. No pneumothorax or effusion The small nodule with spiculated surrounding ground-glass in the posterior right upper lobe which previously was measured at 7 x 7 mm, today has focal nodularity approaching 9 by 7 mm on series 7 image 67. Small nodular area in the anterior right upper lobe which was predominantly ground-glass previously has  slightly more confluent nodularity today but overall the size nodules similar at 7 mm on series 7, image 44. The small nodular area just superior to this subpleural is stable today on series 7, image 40 measuring 5 mm. Small areas of ground-glass in the left upper lobe are stable on image 60 and 62. Small calcified nodules left lower lobe on image 111 and 113. Upper Abdomen: Hepatic cysts are identified. Once again there is a left adrenal nodule with Hounsfield units of 9 and diameter of 20 mm consistent with an adenoma. This was seen on prior PET-CT and did not have abnormal uptake. Musculoskeletal: Slight curvature of the thoracic spine with some degenerative change. IMPRESSION: Stable distribution of focal lung nodularity with some of these being predominantly ground-glass. The dominant nodule in the right lung is with a slightly increase in size of the central solid component. The right upper lobe small nodule  also has slight increase of the central nodular component but subtle and recommend continued close follow-up in 3 months. Aortic Atherosclerosis (ICD10-I70.0) and Emphysema (ICD10-J43.9). Electronically Signed   By: Jill Side M.D.   On: 06/24/2022 20:26    Impression: PET avid right upper lobe spiculated pulmonary nodule concerning for primary bronchogenic carcinoma    The patient is recovering from the effects of radiation.  Recent CT shows slight increase in size of one pulmonary nodule which was not treated. No indication for radiation at this time given the slight interval increase. We will continue to monitor closely.   Plan: Follow up chest CT in 3 months as per radiology recommendations.  Routine office visit 1-2 days after scan to review results. Patient knows to call with any questions or concerns in the meantime.    20 minutes of total time was spent for this patient encounter, including preparation, face-to-face counseling with the patient and coordination of care, physical exam, and documentation of the encounter. ____________________________________   Leona Singleton, PA   Blair Promise, PhD, MD  This document serves as a record of services personally performed by Gery Pray, MD. It was created on his behalf by Roney Mans, a trained medical scribe. The creation of this record is based on the scribe's personal observations and the provider's statements to them. This document has been checked and approved by the attending provider.

## 2022-06-26 ENCOUNTER — Ambulatory Visit
Admission: RE | Admit: 2022-06-26 | Discharge: 2022-06-26 | Disposition: A | Discharge status: Home / Self Care | Payer: PPO | Source: Ambulatory Visit | Attending: Radiation Oncology | Admitting: Radiation Oncology

## 2022-06-26 VITALS — BP 111/61 | HR 95 | Temp 97.6°F | Resp 24 | Ht 64.0 in | Wt 176.6 lb

## 2022-06-26 DIAGNOSIS — R911 Solitary pulmonary nodule: Secondary | ICD-10-CM | POA: Diagnosis not present

## 2022-06-26 DIAGNOSIS — K7689 Other specified diseases of liver: Secondary | ICD-10-CM | POA: Diagnosis not present

## 2022-06-26 DIAGNOSIS — R918 Other nonspecific abnormal finding of lung field: Secondary | ICD-10-CM | POA: Diagnosis not present

## 2022-06-26 DIAGNOSIS — Z923 Personal history of irradiation: Secondary | ICD-10-CM | POA: Insufficient documentation

## 2022-06-26 DIAGNOSIS — J439 Emphysema, unspecified: Secondary | ICD-10-CM | POA: Diagnosis not present

## 2022-06-26 DIAGNOSIS — Z79899 Other long term (current) drug therapy: Secondary | ICD-10-CM | POA: Insufficient documentation

## 2022-06-26 DIAGNOSIS — M47814 Spondylosis without myelopathy or radiculopathy, thoracic region: Secondary | ICD-10-CM | POA: Diagnosis not present

## 2022-06-26 NOTE — Progress Notes (Signed)
Brandy Morales is here today for follow up post radiation to the lung.  Lung Side: Right  Patient completed treatment on 01/05/20.  Does the patient complain of any of the following: Pain: Reports an intense headache to the top of head that tends to occur in the mornings and lessen throughout the day Shortness of breath w/wo exertion: Unchanged; SOB with exertion continues to wear 2L-[place] Cough: Reports an occasional cough for the past month. Reports her allergies have flared up and she alternates between her nose feeling stopped up and running  Hemoptysis: Denies Pain with swallowing: Denies Swallowing/choking concerns: Denies Appetite: Reports a healthy/stable appetite  Energy Level: Reports constant fatigue ("1 out of 10") Post radiation skin Changes: Denies any concerns to her chest, but does report itching to her upper back  Additional comments if applicable: Had CT chest w/o contrast on 06/24/2022. Denies any other issues or cocnerns

## 2022-07-21 DIAGNOSIS — N390 Urinary tract infection, site not specified: Secondary | ICD-10-CM | POA: Diagnosis not present

## 2022-07-28 ENCOUNTER — Telehealth: Payer: Self-pay | Admitting: *Deleted

## 2022-07-28 NOTE — Telephone Encounter (Signed)
Called patient to inform of CT for 09-24-22- arrival time- 3:15 pm @ [facility], no restrictions to test, patient to receive results from Dr. Sondra Come on 09-29-22 @ 10 am, spoke with patient and she is aware of these appts. and the instructions

## 2022-08-11 DIAGNOSIS — N390 Urinary tract infection, site not specified: Secondary | ICD-10-CM | POA: Diagnosis not present

## 2022-08-11 DIAGNOSIS — J302 Other seasonal allergic rhinitis: Secondary | ICD-10-CM | POA: Diagnosis not present

## 2022-08-26 DIAGNOSIS — R5382 Chronic fatigue, unspecified: Secondary | ICD-10-CM | POA: Diagnosis not present

## 2022-08-26 DIAGNOSIS — J441 Chronic obstructive pulmonary disease with (acute) exacerbation: Secondary | ICD-10-CM | POA: Diagnosis not present

## 2022-08-26 DIAGNOSIS — Z87440 Personal history of urinary (tract) infections: Secondary | ICD-10-CM | POA: Diagnosis not present

## 2022-08-26 DIAGNOSIS — J9611 Chronic respiratory failure with hypoxia: Secondary | ICD-10-CM | POA: Diagnosis not present

## 2022-08-26 DIAGNOSIS — E538 Deficiency of other specified B group vitamins: Secondary | ICD-10-CM | POA: Diagnosis not present

## 2022-08-26 DIAGNOSIS — N3000 Acute cystitis without hematuria: Secondary | ICD-10-CM | POA: Diagnosis not present

## 2022-09-04 DIAGNOSIS — N390 Urinary tract infection, site not specified: Secondary | ICD-10-CM | POA: Diagnosis not present

## 2022-09-05 ENCOUNTER — Telehealth: Payer: Self-pay | Admitting: *Deleted

## 2022-09-05 ENCOUNTER — Other Ambulatory Visit: Payer: Self-pay | Admitting: Pulmonary Disease

## 2022-09-05 DIAGNOSIS — Z01818 Encounter for other preprocedural examination: Secondary | ICD-10-CM

## 2022-09-05 DIAGNOSIS — R0609 Other forms of dyspnea: Secondary | ICD-10-CM

## 2022-09-05 NOTE — Telephone Encounter (Signed)
I called and spoke with the pt and notified to be here at 12:30 for appt on 09/08/22 for ECG to be done. She verbalized understanding and agreed to be here at that time. Forwarding to [place] to let her know so can be prepared. I went ahead and placed the order for 12 lead.

## 2022-09-05 NOTE — Telephone Encounter (Signed)
-----   Message from Josephine Igo, DO sent at 09/03/2022 10:40 PM EDT ----- Regarding: ECG next week Hello, Please call patient and tell her to come in 30 minutes early prior to her appointment time so she can have a ECG completed.  That way we can have all of the equipment and everything ready to go when she gets there next week Thanks Brad

## 2022-09-08 ENCOUNTER — Encounter: Payer: Self-pay | Admitting: Pulmonary Disease

## 2022-09-08 ENCOUNTER — Ambulatory Visit (INDEPENDENT_AMBULATORY_CARE_PROVIDER_SITE_OTHER): Payer: PPO | Admitting: Pulmonary Disease

## 2022-09-08 VITALS — BP 100/80 | HR 99 | Ht 64.0 in | Wt 170.0 lb

## 2022-09-08 DIAGNOSIS — R911 Solitary pulmonary nodule: Secondary | ICD-10-CM | POA: Diagnosis not present

## 2022-09-08 DIAGNOSIS — J9611 Chronic respiratory failure with hypoxia: Secondary | ICD-10-CM

## 2022-09-08 DIAGNOSIS — J449 Chronic obstructive pulmonary disease, unspecified: Secondary | ICD-10-CM

## 2022-09-08 MED ORDER — AEROCHAMBER MV MISC: 0 refills | Status: DC

## 2022-09-08 NOTE — Patient Instructions (Signed)
Thank you for visiting Dr. Tonia Brooms at [facility]. Today we recommend the following:  Spacer ECG prior to next appt   Return in about 3 months (around 12/08/2022) for with APP or Dr. Tonia Brooms, ECG prior to appt.    Please do your part to reduce the spread of COVID-19.

## 2022-09-08 NOTE — Progress Notes (Signed)
Synopsis: Referred in June 2021 for abnormal PET scan, Dr. Marchelle Gearing, PCP: By [name], PA  Subjective:   PATIENT ID: Brandy Morales GENDER: female DOB: 1946-04-28, MRN: [id number]  Chief Complaint  Patient presents with   Follow-up    Copd follow up    This is a 77 year old female past medical history of severe COPD, chronic oxygen dependent respiratory failure on 2 L pulse POC, asthma, hypertension.  Patient had CT scan imaging which revealed a right upper lobe pulmonary nodule abutting the major fissure.  Patient underwent nuclear medicine pet imaging which revealed a PET avid right upper lobe pulmonary nodule SUV 5.4.  Patient was referred for evaluation of navigational bronchoscopy and tissue sampling.  Patient felt to be not a good candidate for surgical resection due to poor lung function and functional status.  This was discussed with Dr. Marchelle Gearing who is patient's primary pulmonologist.  OV 04/10/2021: I originally saw the patient in June 2021 for nodule consultation.  She was set up for bronchoscopy however declined due to concern for general anesthesia and ultimately sought radiation oncology in August 2021 and completed 54 Gray, 3 fractions of empiric SBRT to this highly suspicious nodule.  No tissue diagnosis was obtained.  Overall from a respiratory standpoint she is doing well.  She is using her portable oxygen.  She does feel like she may have had some decline in her functional status after hospitalization.  But we have not seen her in greater than a year.  She has been trying to stay at home is much as possible.  OV 03/19/2022: Patient here today for COPD follow-up.  She has chronic hypoxemic respiratory failure.  Currently on 2 L oxygen.  I initially saw her for a suspicious nodule.  She has had exacerbations requiring antibiotics and steroids.  She has been off and on steroids and predominantly on steroids at least for the past 3 months.  She is now down to 10 mg of  prednisone per day.  She is recently struggling after hearing her grandson was shot and killed as an Technical sales engineer in [place].  She is feeling somewhat better but she understands that she has had some progressive decline over the past couple of weeks.  OV 05/08/2022: Here today for follow-up for COPD management.  She has chronic hypoxemic respiratory failure on 2 L.  Stable.  She has been on and off antibiotics and steroids.  Was recently started on a slow prednisone taper.  She is down to 5 mg daily.  She is feeling better.  Would like to continue to wean down on prednisone.  She only took azithromycin for couple of weeks.  The prescription ran out and she missed her follow-up to see [place] due to me having to read schedule.  OV 06/09/2022: Here today for follow-up COPD management.  She is chronic hypoxemic respiratory failure on 2 L.  She is been on and off antibiotics and steroids for a very long time.  She is placed on a prolonged steroid taper.  Last time I saw her she was at 5 mg prednisone daily.  We weaned her down to 2.5 mg prednisone and placed her on azithromycin Monday Wednesday Friday.  She had a infection was treated with ciprofloxacin a few weeks ago and she came off of the azithromycin in the meantime.  She feels better she is in no respiratory symptoms at this point.  OV 09/08/2022: Here for today for COPD follow-up.  She has chronic hypoxemic respiratory  failure on 2 L.  Had a recent exacerbation requiring antibiotics and steroids.  They changed her to azithromycin daily followed by Cipro.  She has been using her triple therapy inhaler regimen.  Not using a spacer.     Past Medical History:  Diagnosis Date   Anxiety    Asthma    COPD (chronic obstructive pulmonary disease)    GERD (gastroesophageal reflux disease)    History of kidney stones    History of radiation therapy 01/05/2020   IMRT right lung 54 Gy in 3 Fractions 12/29/2019 through 01/05/2020  Dr Antony Blackbird   Hypertension    NSCL  CA 10/2019     No family history on file.   Past Surgical History:  Procedure Laterality Date   ABDOMINAL HYSTERECTOMY  age 77   partial 1 ovary left   APPENDECTOMY     colonscopy     CYSTOSCOPY W/ URETERAL STENT PLACEMENT Left 12/30/2020   Procedure: CYSTOSCOPY WITH RETROGRADE PYELOGRAM/URETERAL STENT PLACEMENT;  Surgeon: Malen Gauze, MD;  Location: [facility];  Service: Urology;  Laterality: Left;   CYSTOSCOPY WITH RETROGRADE PYELOGRAM, URETEROSCOPY AND STENT PLACEMENT N/A 11/30/2017   Procedure: CYSTOSCOPY WITH BILATERAL RETROGRADE PYELOGRAM, LEFT URETEROSCOPY/ LEFT LASER LITHO AND LEFT STENT PLACEMENT;  Surgeon: Crista Elliot, MD;  Location: [facility];  Service: Urology;  Laterality: N/A;   CYSTOSCOPY WITH RETROGRADE PYELOGRAM, URETEROSCOPY AND STENT PLACEMENT Left 02/01/2018   Procedure: CYSTOSCOPY WITH LEFT RETROGRADE PYELOGRAM, URETEROSCOPY HOLMIUM LASER AND STENT PLACEMENT;  Surgeon: Crista Elliot, MD;  Location: [facility];  Service: Urology;  Laterality: Left;   EXTRACORPOREAL SHOCK WAVE LITHOTRIPSY Left 02/07/2021   Procedure: LEFT EXTRACORPOREAL SHOCK WAVE LITHOTRIPSY (ESWL);  Surgeon: Crista Elliot, MD;  Location: [facility];  Service: Urology;  Laterality: Left;  90 MINS FOR THIS CASE NEED CRNA ON TRUCK  PLEASE CLOSE ROOM 2 AT 11:15 FOR  THIS CASE   EYE SURGERY Bilateral    Cataract   TONSILLECTOMY      Social History   Socioeconomic History   Marital status: Widowed    Spouse name: Not on file   Number of children: Not on file   Years of education: Not on file   Highest education level: Not on file  Occupational History   Not on file  Tobacco Use   Smoking status: Former    Packs/day: 2.00    Years: 55.00    Additional pack years: 0.00    Total pack years: 110.00    Types: Cigarettes    Quit date: 2016    Years since quitting: 8.2   Smokeless tobacco: Never  Vaping Use   Vaping Use: Never used  Substance and Sexual Activity    Alcohol use: Yes    Alcohol/week: 1.0 standard drink of alcohol    Types: 1 Glasses of wine per week    Comment: weekly   Drug use: Never   Sexual activity: Not Currently  Other Topics Concern   Not on file  Social History Narrative   Not on file   Social Determinants of Health   Financial Resource Strain: Not on file  Food Insecurity: Not on file  Transportation Needs: Not on file  Physical Activity: Not on file  Stress: Not on file  Social Connections: Not on file  Intimate Partner Violence: Not on file     Allergies  Allergen Reactions   Statins Cough   Latex Other (See Comments)  Broke out on face   Morphine And Related Other (See Comments)    Describes: "feel like I lose control of my body."   Oxycodone Other (See Comments)    Describes: "feels like I lose control of my body"     Outpatient Medications Prior to Visit  Medication Sig Dispense Refill   albuterol (ACCUNEB) 1.25 MG/3ML nebulizer solution Take 1 ampule by nebulization every 6 (six) hours as needed for wheezing.     albuterol (VENTOLIN HFA) 108 (90 Base) MCG/ACT inhaler Inhale 2 puffs into the lungs every 6 (six) hours as needed for wheezing or shortness of breath.     Artificial Tear Solution (GENTEAL TEARS OP) Place 1 drop into both eyes daily.      atorvastatin (LIPITOR) 10 MG tablet Take 5 mg by mouth 3 (three) times a week. Take on MWF     BREZTRI AEROSPHERE 160-9-4.8 MCG/ACT AERO INHALE TWO PUFFS BY MOUTH INTO LUNGS EVERY MORNING AND AT BEDTIME 10.7 g 4   cetirizine (ZYRTEC) 10 MG tablet Take 10 mg by mouth daily.     Cholecalciferol (VITAMIN D3) 5000 units CAPS Take 5,000 Units by mouth daily.     citalopram (CELEXA) 20 MG tablet Take 20 mg by mouth daily.     ezetimibe (ZETIA) 10 MG tablet Take 10 mg by mouth at bedtime.     fluticasone (FLONASE) 50 MCG/ACT nasal spray Place 1 spray into both nostrils daily.      losartan (COZAAR) 25 MG tablet Take 25 mg by mouth daily.     Multiple Vitamin  (MULTIVITAMIN) tablet Take 1 tablet by mouth daily.     omeprazole (PRILOSEC) 20 MG capsule Take 20 mg by mouth daily.      OXYGEN Inhale 2 L into the lungs continuous.      No facility-administered medications prior to visit.    Review of Systems  Constitutional:  Negative for chills, fever, malaise/fatigue and weight loss.  HENT:  Negative for hearing loss, sore throat and tinnitus.   Eyes:  Negative for blurred vision and double vision.  Respiratory:  Positive for cough and shortness of breath. Negative for hemoptysis, sputum production, wheezing and stridor.   Cardiovascular:  Negative for chest pain, palpitations, orthopnea, leg swelling and PND.  Gastrointestinal:  Negative for abdominal pain, constipation, diarrhea, heartburn, nausea and vomiting.  Genitourinary:  Negative for dysuria, hematuria and urgency.  Musculoskeletal:  Negative for joint pain and myalgias.  Skin:  Negative for itching and rash.  Neurological:  Negative for dizziness, tingling, weakness and headaches.  Endo/Heme/Allergies:  Negative for environmental allergies. Does not bruise/bleed easily.  Psychiatric/Behavioral:  Negative for depression. The patient is not nervous/anxious and does not have insomnia.   All other systems reviewed and are negative.    Objective:  Physical Exam Vitals reviewed.  Constitutional:      General: She is not in acute distress.    Appearance: She is well-developed.  HENT:     Head: Normocephalic and atraumatic.  Eyes:     General: No scleral icterus.    Conjunctiva/sclera: Conjunctivae normal.     Pupils: Pupils are equal, round, and reactive to light.  Neck:     Vascular: No JVD.     Trachea: No tracheal deviation.  Cardiovascular:     Rate and Rhythm: Normal rate and regular rhythm.     Heart sounds: Normal heart sounds. No murmur heard. Pulmonary:     Effort: Pulmonary effort is normal. No tachypnea, accessory muscle  usage or respiratory distress.     Breath  sounds: No stridor. No wheezing, rhonchi or rales.  Abdominal:     General: There is no distension.     Palpations: Abdomen is soft.     Tenderness: There is no abdominal tenderness.  Musculoskeletal:        General: No tenderness.     Cervical back: Neck supple.  Lymphadenopathy:     Cervical: No cervical adenopathy.  Skin:    General: Skin is warm and dry.     Capillary Refill: Capillary refill takes less than 2 seconds.     Findings: No rash.  Neurological:     Mental Status: She is alert and oriented to person, place, and time.  Psychiatric:        Behavior: Behavior normal.      Vitals:   09/08/22 1320  BP: 100/80  Pulse: 99  SpO2: 98%  Weight: 170 lb (77.1 kg)  Height: 5\' 4"  (1.626 m)    98% on  RA BMI Readings from Last 3 Encounters:  09/08/22 29.18 kg/m  06/26/22 30.31 kg/m  06/09/22 30.28 kg/m   Wt Readings from Last 3 Encounters:  09/08/22 170 lb (77.1 kg)  06/26/22 176 lb 9.6 oz (80.1 kg)  06/09/22 176 lb 6.4 oz (80 kg)     CBC    Component Value Date/Time   WBC 8.5 01/02/2021 0503   RBC 4.40 01/02/2021 0503   RBC 4.32 01/02/2021 0503   HGB 12.3 01/02/2021 0503   HCT 39.5 01/02/2021 0503   PLT 145 (L) 01/02/2021 0503   MCV 91.4 01/02/2021 0503   MCH 28.5 01/02/2021 0503   MCHC 31.1 01/02/2021 0503   RDW 14.3 01/02/2021 0503   LYMPHSABS 0.5 (L) 12/29/2020 0657   MONOABS 0.8 12/29/2020 0657   EOSABS 0.0 12/29/2020 0657   BASOSABS 0.0 12/29/2020 0657     Chest Imaging: 09/14/2019 CT chest: Right upper lobe spiculated pulmonary nodule abutting major fissure. The patient's images have been independently reviewed by me.    10/12/2019 nuclear medicine pet imaging. Right upper lobe PET avid pulmonary nodule SUV 5.45, 11 mm in largest cross-section, mild tenting of the fissure line. Concerning for primary bronchogenic carcinoma. The patient's images have been independently reviewed by me.    11/30/2020 CT chest: Treated nodule right upper lobe  decreased size post radiation treatments Enlarging 7 mm peribronchovascular nodule in the left lower lobe. The patient's images have been independently reviewed by me.    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  Increased ill-defined groundglass nodules in the left upper lobe.  Likely inflammatory. Evidence of emphysema. The patient's images have been independently reviewed by me.    January 2024 CT chest: Nodule in the right lung slightly increased in the size with a central solid component has a right upper lobe nodule slight increase. CT 26-month follow-up recommended. The patient's images have been independently reviewed by me.     Pulmonary Functions Testing Results:    Latest Ref Rng & Units 05/14/2017   10:49 AM  PFT Results  FVC-Pre L 1.50   FVC-Predicted Pre % 48   Pre FEV1/FVC % % 42   FEV1-Pre L 0.63   FEV1-Predicted Pre % 26   DLCO uncorrected ml/min/mmHg 9.57   DLCO UNC% % 37   DLCO corrected ml/min/mmHg 9.09   DLCO COR %Predicted % 35   DLVA Predicted % 45  TLC L 9.98   TLC % Predicted % 192   RV % Predicted % 366     FeNO: none  Pathology: none  Echocardiogram: none  Heart Catheterization: none    Assessment & Plan:     ICD-10-CM   1. Stage 4 very severe COPD by GOLD classification  J44.9     2. Chronic respiratory failure with hypoxia  J96.11     3. Nodule of right lung  R91.1        Discussion:  77 year old female, advanced COPD, stage IV disease, chronic hypoxemic respiratory failure on triple therapy inhaler regimen.  Weaned off of prednisone.  Plan: Azithromycin 250 mg Monday Wednesday Friday Continue triple therapy inhaler regimen with Breztri plus spacer Repeat EKG today, QTc stable. RTC in 3 months. If she continues to have ongoing symptoms would consider movement to triple therapy nebulizer regimen.    Current  Outpatient Medications:    Spacer/Aero-Holding Chambers (AEROCHAMBER MV) inhaler, Use as instructed, Disp: 1 each, Rfl: 0   albuterol (ACCUNEB) 1.25 MG/3ML nebulizer solution, Take 1 ampule by nebulization every 6 (six) hours as needed for wheezing., Disp: , Rfl:    albuterol (VENTOLIN HFA) 108 (90 Base) MCG/ACT inhaler, Inhale 2 puffs into the lungs every 6 (six) hours as needed for wheezing or shortness of breath., Disp: , Rfl:    Artificial Tear Solution (GENTEAL TEARS OP), Place 1 drop into both eyes daily. , Disp: , Rfl:    atorvastatin (LIPITOR) 10 MG tablet, Take 5 mg by mouth 3 (three) times a week. Take on MWF, Disp: , Rfl:    BREZTRI AEROSPHERE 160-9-4.8 MCG/ACT AERO, INHALE TWO PUFFS BY MOUTH INTO LUNGS EVERY MORNING AND AT BEDTIME, Disp: 10.7 g, Rfl: 4   cetirizine (ZYRTEC) 10 MG tablet, Take 10 mg by mouth daily., Disp: , Rfl:    Cholecalciferol (VITAMIN D3) 5000 units CAPS, Take 5,000 Units by mouth daily., Disp: , Rfl:    citalopram (CELEXA) 20 MG tablet, Take 20 mg by mouth daily., Disp: , Rfl:    ezetimibe (ZETIA) 10 MG tablet, Take 10 mg by mouth at bedtime., Disp: , Rfl:    fluticasone (FLONASE) 50 MCG/ACT nasal spray, Place 1 spray into both nostrils daily. , Disp: , Rfl:    losartan (COZAAR) 25 MG tablet, Take 25 mg by mouth daily., Disp: , Rfl:    Multiple Vitamin (MULTIVITAMIN) tablet, Take 1 tablet by mouth daily., Disp: , Rfl:    omeprazole (PRILOSEC) 20 MG capsule, Take 20 mg by mouth daily. , Disp: , Rfl:    OXYGEN, Inhale 2 L into the lungs continuous. , Disp: , Rfl:    Josephine Igo, DO [facility] 09/08/2022 1:55 PM

## 2022-09-09 ENCOUNTER — Telehealth: Payer: Self-pay | Admitting: Pulmonary Disease

## 2022-09-09 NOTE — Telephone Encounter (Signed)
Pharm calling saying they have fax'd over req for refills to [place] twice.   [facility] # is [phone]  Dorathy Daft is the contact. TY

## 2022-09-10 MED ORDER — BREZTRI AEROSPHERE 160-9-4.8 MCG/ACT IN AERO: 2.0000 | INHALATION_SPRAY | Freq: Two times a day (BID) | RESPIRATORY_TRACT | 4 refills | Status: DC

## 2022-09-10 MED ORDER — LEVALBUTEROL HCL 1.25 MG/3ML IN NEBU: 1.2500 mg | INHALATION_SOLUTION | RESPIRATORY_TRACT | 11 refills | Status: DC | PRN

## 2022-09-10 NOTE — Telephone Encounter (Signed)
Refills sent to [facility]. Nothing further needed

## 2022-09-18 DIAGNOSIS — N200 Calculus of kidney: Secondary | ICD-10-CM | POA: Diagnosis not present

## 2022-09-24 ENCOUNTER — Ambulatory Visit (HOSPITAL_COMMUNITY)
Admission: RE | Admit: 2022-09-24 | Discharge: 2022-09-24 | Disposition: A | Discharge status: Home / Self Care | Payer: PPO | Source: Ambulatory Visit | Attending: Radiology | Admitting: Radiology

## 2022-09-24 DIAGNOSIS — R918 Other nonspecific abnormal finding of lung field: Secondary | ICD-10-CM

## 2022-09-26 NOTE — Progress Notes (Signed)
Radiation Oncology         [phone] ________________________________  Name: Brandy Morales MRN: [id number]  Date: 09/29/2022  DOB: 02-18-1946  Follow-Up Visit Note  CC: [name], [place]  Kalman Shan, MD    ICD-10-CM   1. Pulmonary nodules [R91.8]  R91.8 CT CHEST WO CONTRAST      Diagnosis: PET avid right upper lobe spiculated pulmonary nodule concerning for primary bronchogenic carcinoma    Interval Since Last Radiation: 2 years, 8 months, and 24 days   Radiation Treatment Dates: 12/29/2019 through 01/05/2020 Site Technique Total Dose (Gy) Dose per Fx (Gy) Completed Fx Beam Energies  Lung, Right: Lung_Rt SBRT 54/54 18 3/3 6XFFF   Narrative:  The patient returns today for routine follow-up. To review from her last visit, the patient had a CT scan performed on 06/24/22 which showed a slight increase in size of one of the right sided pulmonary nodules which was not treated. I recommended that she proceed with follow up imaging in 3 months per radiology.    Since her last visit, the patient followed up with Dr. Tonia Brooms on 09/08/22 pertaining to her history of COPD and recent exacerbation. Dr. Tonia Brooms has recommended that she continue with Azithromycin 250 mg on MWF, and her triple therapy inhaler regimen with Breztri plus spacer.  Her most recent chest CT on 09/24/22 shows no interval change in the area of focal opacity and associated scarring in the right upper lobe. Given interval stability, this finding likely represents post-treatment related scarring. CT also showed interval stability of the other solid ground glass nodules and no new nodules or acute findings.    On evaluation today the patient reports no new issues.  She continues on 2 L of oxygen 24 hours a day.                        Allergies:  is allergic to statins, latex, morphine and related, and oxycodone.  Meds: Current Outpatient Medications  Medication Sig Dispense Refill   albuterol (ACCUNEB) 1.25 MG/3ML  nebulizer solution Take 1 ampule by nebulization every 6 (six) hours as needed for wheezing.     albuterol (VENTOLIN HFA) 108 (90 Base) MCG/ACT inhaler Inhale 2 puffs into the lungs every 6 (six) hours as needed for wheezing or shortness of breath.     Artificial Tear Solution (GENTEAL TEARS OP) Place 1 drop into both eyes daily.      atorvastatin (LIPITOR) 10 MG tablet Take 5 mg by mouth 3 (three) times a week. Take on MWF     Budeson-Glycopyrrol-Formoterol (BREZTRI AEROSPHERE) 160-9-4.8 MCG/ACT AERO Inhale 2 puffs into the lungs 2 (two) times daily. 10.7 g 4   Cholecalciferol (VITAMIN D3) 5000 units CAPS Take 5,000 Units by mouth daily.     citalopram (CELEXA) 20 MG tablet Take 20 mg by mouth daily.     ezetimibe (ZETIA) 10 MG tablet Take 10 mg by mouth at bedtime.     fluticasone (FLONASE) 50 MCG/ACT nasal spray Place 1 spray into both nostrils daily.      levalbuterol (XOPENEX) 1.25 MG/3ML nebulizer solution Take 1.25 mg by nebulization every 4 (four) hours as needed for wheezing or shortness of breath. 72 mL 11   losartan (COZAAR) 25 MG tablet Take 25 mg by mouth daily.     Multiple Vitamin (MULTIVITAMIN) tablet Take 1 tablet by mouth daily.     OXYGEN Inhale 2 L into the lungs continuous.  Spacer/Aero-Holding Chambers (AEROCHAMBER MV) inhaler Use as instructed 1 each 0   cetirizine (ZYRTEC) 10 MG tablet Take 10 mg by mouth daily. (Patient not taking: Reported on 09/29/2022)     No current facility-administered medications for this encounter.    Physical Findings: The patient is in no acute distress. Patient is alert and oriented.  height is 5\' 4"  (1.626 m) and weight is 169 lb 4 oz (76.8 kg). Her temporal temperature is 96.7 F (35.9 C) (abnormal). Her blood pressure is 109/76 and her pulse is 107 (abnormal). Her respiration is 22 (abnormal) and oxygen saturation is 97%. .   Lungs are clear to auscultation bilaterally. Heart has regular rate and rhythm. No palpable cervical,  supraclavicular, or axillary adenopathy. Abdomen soft, non-tender, normal bowel sounds.   Lab Findings: Lab Results  Component Value Date   WBC 8.5 01/02/2021   HGB 12.3 01/02/2021   HCT 39.5 01/02/2021   MCV 91.4 01/02/2021   PLT 145 (L) 01/02/2021    Radiographic Findings: CT Chest Wo Contrast  Result Date: 09/26/2022 CLINICAL DATA:  Lung nodule follow-up. History of non-small cell lung carcinoma treated with radiation therapy in August 2021. EXAM: CT CHEST WITHOUT CONTRAST TECHNIQUE: Multidetector CT imaging of the chest was performed following the standard protocol without IV contrast. RADIATION DOSE REDUCTION: This exam was performed according to the departmental dose-optimization program which includes automated exposure control, adjustment of the mA and/or kV according to patient size and/or use of iterative reconstruction technique. COMPARISON:  06/24/2022 and multiple prior chest CTs and PET CTs dating back to 03/30/2017. FINDINGS: Cardiovascular: Heart normal in size and configuration. Three-vessel coronary artery calcifications. No pericardial effusion. Great vessels are normal in caliber. Stable aortic atherosclerotic calcifications. Mediastinum/Nodes: No neck base, mediastinal or hilar masses. No enlarged lymph nodes. Trachea and esophagus are unremarkable. Lungs/Pleura: Discoid area of opacity in the right upper lobe associated with scarring and architectural distortion, is without significant change from the most recent prior chest CT. Measured on the coronal reconstructed images, the central nodular component of this measures 1.3 x 0.6 cm, which is unchanged when measuring in the same location on the exam from 06/24/2022. Adjacent sub solid nodules in the anterior right upper lobe, images 47 and 43, series 7, are also unchanged, measuring 7 mm and 5 mm respectively. Areas of ground-glass opacity in the left upper lobe, centered on image 62, are also stable. No new lung nodules.  Stable  changes of centrilobular emphysema. No pleural effusion or pneumothorax. Upper Abdomen: Multiple liver cysts. 3 cm left adrenal nodule consistent with an adenoma, unchanged allowing for differences in measurement technique. No acute findings. Musculoskeletal: No fracture or acute finding.  No bone lesion. IMPRESSION: 1. No change when compared to the most recent prior CT. The area of focal opacity and associated scarring in the right upper lobe, best compared on the coronal reconstructed sequence, is unchanged from 06/24/2022, but does show an increase when compared to older CT scans from 06/07/2021 and 11/30/2020. Stability from the most recent exam supports post treatment related scarring as etiology. Recommend continued surveillance with follow-up chest CT without contrast in 6 months. Other lung nodules, sub solid ground-glass, are also unchanged from the prior CT. No new nodules. 2. No acute findings. 3. Emphysema and aortic atherosclerosis. Coronary artery calcifications. Aortic Atherosclerosis (ICD10-I70.0) and Emphysema (ICD10-J43.9). Electronically Signed   By: Amie Portland M.D.   On: 09/26/2022 10:44    Impression: PET avid right upper lobe spiculated pulmonary nodule concerning  for primary bronchogenic carcinoma    No evidence of recurrence on clinical exam today.  Recent chest CT scan favorable.  Discussed with the patient that we can return back to 69-month interval follow-up CT scans.  Plan: Routine follow-up in 6 months.  Prior to this follow-up appointment the patient will have a CT scan of the chest.   22 minutes of total time was spent for this patient encounter, including preparation, face-to-face counseling with the patient and coordination of care, physical exam, and documentation of the encounter. ____________________________________  Billie Lade, PhD, MD  This document serves as a record of services personally performed by Antony Blackbird, MD. It was created on his behalf by  Neena Rhymes, a trained medical scribe. The creation of this record is based on the scribe's personal observations and the provider's statements to them. This document has been checked and approved by the attending provider.

## 2022-09-29 ENCOUNTER — Ambulatory Visit
Admission: RE | Admit: 2022-09-29 | Discharge: 2022-09-29 | Disposition: A | Discharge status: Home / Self Care | Payer: PPO | Source: Ambulatory Visit | Attending: Radiation Oncology | Admitting: Radiation Oncology

## 2022-09-29 ENCOUNTER — Encounter: Payer: Self-pay | Admitting: Radiation Oncology

## 2022-09-29 VITALS — BP 109/76 | HR 107 | Temp 96.7°F | Resp 22 | Ht 64.0 in | Wt 169.2 lb

## 2022-09-29 DIAGNOSIS — Z923 Personal history of irradiation: Secondary | ICD-10-CM | POA: Diagnosis not present

## 2022-09-29 DIAGNOSIS — I2510 Atherosclerotic heart disease of native coronary artery without angina pectoris: Secondary | ICD-10-CM | POA: Insufficient documentation

## 2022-09-29 DIAGNOSIS — J432 Centrilobular emphysema: Secondary | ICD-10-CM | POA: Insufficient documentation

## 2022-09-29 DIAGNOSIS — R918 Other nonspecific abnormal finding of lung field: Secondary | ICD-10-CM | POA: Insufficient documentation

## 2022-09-29 DIAGNOSIS — Z79899 Other long term (current) drug therapy: Secondary | ICD-10-CM | POA: Diagnosis not present

## 2022-09-29 DIAGNOSIS — K7689 Other specified diseases of liver: Secondary | ICD-10-CM | POA: Insufficient documentation

## 2022-09-29 NOTE — Progress Notes (Signed)
Brandy Morales is here today for follow up post radiation to the lung.  Lung Side: Right lung, patient completed treatment on 01/05/20.  Does the patient complain of any of the following: Pain: No Shortness of breath w/wo exertion: yes, patient continues to use oxygen at 3 liters via [place].  Cough: No Hemoptysis: No Pain with swallowing: No Swallowing/choking concerns:No Appetite: Great Energy Level: Low Post radiation skin Changes: No     Additional comments if applicable:   BP 109/76 (BP Location: Left Arm, Patient Position: Sitting)   Pulse (!) 107   Temp (!) 96.7 F (35.9 C) (Temporal)   Resp (!) 22   Ht 5\' 4"  (1.626 m)   Wt 169 lb 4 oz (76.8 kg)   SpO2 97%   BMI 29.05 kg/m

## 2022-10-07 ENCOUNTER — Telehealth: Payer: Self-pay | Admitting: *Deleted

## 2022-10-07 NOTE — Telephone Encounter (Signed)
CALLED PATIENT TO INFORM OF CT FOR 04-01-23- ARRIVAL TIME- 11 AM  @ [facility], NO RESTRICTIONS TO TEST, PATIENT TO RECEIVE RESULTS FROM DR. KINARD ON 04-06-23 @ 10 AM FOR RESULTS, SPOKE WITH PATIENT AND SHE IS AWARE OF THESE APPTS. AND THE INSTRUCTIONS

## 2022-10-13 DIAGNOSIS — N130 Hydronephrosis with ureteropelvic junction obstruction: Secondary | ICD-10-CM | POA: Diagnosis not present

## 2022-10-13 DIAGNOSIS — N132 Hydronephrosis with renal and ureteral calculous obstruction: Secondary | ICD-10-CM | POA: Diagnosis not present

## 2022-10-13 DIAGNOSIS — N201 Calculus of ureter: Secondary | ICD-10-CM | POA: Diagnosis not present

## 2022-10-13 DIAGNOSIS — K5730 Diverticulosis of large intestine without perforation or abscess without bleeding: Secondary | ICD-10-CM | POA: Diagnosis not present

## 2022-10-15 ENCOUNTER — Other Ambulatory Visit: Payer: Self-pay | Admitting: Urology

## 2022-10-16 DIAGNOSIS — K219 Gastro-esophageal reflux disease without esophagitis: Secondary | ICD-10-CM | POA: Diagnosis not present

## 2022-10-16 DIAGNOSIS — I700 Atherosclerosis of aorta: Secondary | ICD-10-CM | POA: Diagnosis not present

## 2022-10-16 DIAGNOSIS — E7800 Pure hypercholesterolemia, unspecified: Secondary | ICD-10-CM | POA: Diagnosis not present

## 2022-10-16 DIAGNOSIS — J449 Chronic obstructive pulmonary disease, unspecified: Secondary | ICD-10-CM | POA: Diagnosis not present

## 2022-10-16 DIAGNOSIS — G720 Drug-induced myopathy: Secondary | ICD-10-CM | POA: Diagnosis not present

## 2022-10-16 DIAGNOSIS — Z1331 Encounter for screening for depression: Secondary | ICD-10-CM | POA: Diagnosis not present

## 2022-10-16 DIAGNOSIS — D4100 Neoplasm of uncertain behavior of unspecified kidney: Secondary | ICD-10-CM | POA: Diagnosis not present

## 2022-10-16 DIAGNOSIS — J9611 Chronic respiratory failure with hypoxia: Secondary | ICD-10-CM | POA: Diagnosis not present

## 2022-10-16 DIAGNOSIS — Z9181 History of falling: Secondary | ICD-10-CM | POA: Diagnosis not present

## 2022-10-16 DIAGNOSIS — J439 Emphysema, unspecified: Secondary | ICD-10-CM | POA: Diagnosis not present

## 2022-10-16 DIAGNOSIS — E559 Vitamin D deficiency, unspecified: Secondary | ICD-10-CM | POA: Diagnosis not present

## 2022-10-16 DIAGNOSIS — I10 Essential (primary) hypertension: Secondary | ICD-10-CM | POA: Diagnosis not present

## 2022-10-16 DIAGNOSIS — K5730 Diverticulosis of large intestine without perforation or abscess without bleeding: Secondary | ICD-10-CM | POA: Diagnosis not present

## 2022-10-16 DIAGNOSIS — Z0000 Encounter for general adult medical examination without abnormal findings: Secondary | ICD-10-CM | POA: Diagnosis not present

## 2022-10-17 DIAGNOSIS — N201 Calculus of ureter: Secondary | ICD-10-CM | POA: Diagnosis not present

## 2022-10-21 ENCOUNTER — Encounter (HOSPITAL_BASED_OUTPATIENT_CLINIC_OR_DEPARTMENT_OTHER): Payer: Self-pay | Admitting: Urology

## 2022-10-21 NOTE — Progress Notes (Signed)
10/21/2022 1:24 PM Left message to discuss lithotripsy instructions with patient. Call back numbers given.  Kavin Weckwerth, Blanchard Kelch

## 2022-10-21 NOTE — Progress Notes (Signed)
10/21/2022 2:51 PM Pre procedure call completed. Pt. Updated on date and time of arrival 10/24/22 at 0645. Times for NPO MN and clear liquid consumption until MN reviewed. Pt. Allergies, medical hx and medication list reviewed. Medications ok to take day of surgery (NONE) and those to hold prior to surgery reviewed. Ride secured for day of surgery, Daughter Jalene Mullet. Questions and concerns addressed by patient. Pt. Verbalized understanding of all instructions. Pt. On home o2, pt. To bring concentrator with her DOS.   Zakir Henner, Blanchard Kelch

## 2022-10-24 ENCOUNTER — Encounter (HOSPITAL_BASED_OUTPATIENT_CLINIC_OR_DEPARTMENT_OTHER): Payer: Self-pay | Admitting: Urology

## 2022-10-24 ENCOUNTER — Ambulatory Visit (HOSPITAL_BASED_OUTPATIENT_CLINIC_OR_DEPARTMENT_OTHER): Payer: PPO | Admitting: Anesthesiology

## 2022-10-24 ENCOUNTER — Other Ambulatory Visit: Payer: Self-pay

## 2022-10-24 ENCOUNTER — Ambulatory Visit (HOSPITAL_BASED_OUTPATIENT_CLINIC_OR_DEPARTMENT_OTHER)
Admission: RE | Admit: 2022-10-24 | Discharge: 2022-10-24 | Disposition: A | Discharge status: Home / Self Care | Payer: PPO | Attending: Urology | Admitting: Urology

## 2022-10-24 ENCOUNTER — Ambulatory Visit (HOSPITAL_COMMUNITY): Payer: PPO

## 2022-10-24 ENCOUNTER — Encounter (HOSPITAL_BASED_OUTPATIENT_CLINIC_OR_DEPARTMENT_OTHER)
Admission: RE | Disposition: A | Discharge status: Home / Self Care | Payer: Self-pay | Source: Home / Self Care | Attending: Urology

## 2022-10-24 DIAGNOSIS — I10 Essential (primary) hypertension: Secondary | ICD-10-CM | POA: Insufficient documentation

## 2022-10-24 DIAGNOSIS — Z6826 Body mass index (BMI) 26.0-26.9, adult: Secondary | ICD-10-CM | POA: Diagnosis not present

## 2022-10-24 DIAGNOSIS — N201 Calculus of ureter: Secondary | ICD-10-CM

## 2022-10-24 DIAGNOSIS — E669 Obesity, unspecified: Secondary | ICD-10-CM | POA: Diagnosis not present

## 2022-10-24 DIAGNOSIS — Z7951 Long term (current) use of inhaled steroids: Secondary | ICD-10-CM | POA: Insufficient documentation

## 2022-10-24 DIAGNOSIS — F419 Anxiety disorder, unspecified: Secondary | ICD-10-CM | POA: Diagnosis not present

## 2022-10-24 DIAGNOSIS — Z87891 Personal history of nicotine dependence: Secondary | ICD-10-CM | POA: Diagnosis not present

## 2022-10-24 DIAGNOSIS — Z9981 Dependence on supplemental oxygen: Secondary | ICD-10-CM | POA: Insufficient documentation

## 2022-10-24 DIAGNOSIS — Z79899 Other long term (current) drug therapy: Secondary | ICD-10-CM | POA: Insufficient documentation

## 2022-10-24 DIAGNOSIS — J449 Chronic obstructive pulmonary disease, unspecified: Secondary | ICD-10-CM | POA: Insufficient documentation

## 2022-10-24 DIAGNOSIS — Z923 Personal history of irradiation: Secondary | ICD-10-CM | POA: Insufficient documentation

## 2022-10-24 DIAGNOSIS — K219 Gastro-esophageal reflux disease without esophagitis: Secondary | ICD-10-CM | POA: Diagnosis not present

## 2022-10-24 DIAGNOSIS — N202 Calculus of kidney with calculus of ureter: Secondary | ICD-10-CM | POA: Diagnosis not present

## 2022-10-24 HISTORY — PX: EXTRACORPOREAL SHOCK WAVE LITHOTRIPSY: SHX1557

## 2022-10-24 HISTORY — DX: Dependence on supplemental oxygen: Z99.81

## 2022-10-24 SURGERY — LITHOTRIPSY, ESWL
Anesthesia: Monitor Anesthesia Care | Laterality: Right

## 2022-10-24 MED ORDER — CIPROFLOXACIN HCL 500 MG PO TABS
ORAL_TABLET | ORAL | Status: AC
  Filled 2022-10-24: qty 1

## 2022-10-24 MED ORDER — MIDAZOLAM HCL 5 MG/5ML IJ SOLN
INTRAMUSCULAR | Status: DC | PRN
  Administered 2022-10-24: 1 mg via INTRAVENOUS

## 2022-10-24 MED ORDER — PROPOFOL 500 MG/50ML IV EMUL
INTRAVENOUS | Status: DC | PRN
  Administered 2022-10-24: 100 ug/kg/min via INTRAVENOUS

## 2022-10-24 MED ORDER — PROPOFOL 1000 MG/100ML IV EMUL
INTRAVENOUS | Status: AC
  Filled 2022-10-24: qty 100

## 2022-10-24 MED ORDER — TRAMADOL HCL 50 MG PO TABS: 50.0000 mg | ORAL_TABLET | Freq: Four times a day (QID) | ORAL | 0 refills | Status: DC | PRN

## 2022-10-24 MED ORDER — ONDANSETRON 8 MG PO TBDP: 8.0000 mg | ORAL_TABLET | Freq: Three times a day (TID) | ORAL | 1 refills | Status: DC | PRN

## 2022-10-24 MED ORDER — LIDOCAINE 2% (20 MG/ML) 5 ML SYRINGE
INTRAMUSCULAR | Status: DC | PRN
  Administered 2022-10-24: 50 mg via INTRAVENOUS

## 2022-10-24 MED ORDER — DIPHENHYDRAMINE HCL 25 MG PO CAPS: 25.0000 mg | ORAL_CAPSULE | ORAL | Status: DC

## 2022-10-24 MED ORDER — CIPROFLOXACIN HCL 500 MG PO TABS
500.0000 mg | ORAL_TABLET | ORAL | Status: AC
  Administered 2022-10-24: 500 mg via ORAL

## 2022-10-24 MED ORDER — SODIUM CHLORIDE 0.9 % IV SOLN: INTRAVENOUS | Status: DC

## 2022-10-24 MED ORDER — LIDOCAINE HCL (PF) 2 % IJ SOLN
INTRAMUSCULAR | Status: AC
  Filled 2022-10-24: qty 5

## 2022-10-24 MED ORDER — MIDAZOLAM HCL 2 MG/2ML IJ SOLN
INTRAMUSCULAR | Status: AC
  Filled 2022-10-24: qty 2

## 2022-10-24 MED ORDER — PHENYLEPHRINE 80 MCG/ML (10ML) SYRINGE FOR IV PUSH (FOR BLOOD PRESSURE SUPPORT)
PREFILLED_SYRINGE | INTRAVENOUS | Status: DC | PRN
  Administered 2022-10-24 (×3): 80 ug via INTRAVENOUS

## 2022-10-24 MED ORDER — DIAZEPAM 5 MG PO TABS: 10.0000 mg | ORAL_TABLET | ORAL | Status: DC

## 2022-10-24 MED ORDER — FENTANYL CITRATE (PF) 100 MCG/2ML IJ SOLN
INTRAMUSCULAR | Status: AC
  Filled 2022-10-24: qty 2

## 2022-10-24 MED ORDER — FENTANYL CITRATE (PF) 100 MCG/2ML IJ SOLN
INTRAMUSCULAR | Status: DC | PRN
  Administered 2022-10-24: 50 ug via INTRAVENOUS

## 2022-10-24 NOTE — Discharge Instructions (Signed)
1 - You may have urinary urgency (bladder spasms), pass small stone fragments, and bloody urine on / off for up to 2 weeks. This is normal. ° °2 - Call MD or go to ER for fever >102, severe pain / nausea / vomiting not relieved by medications, or acute change in medical status ° °

## 2022-10-24 NOTE — Transfer of Care (Signed)
Immediate Anesthesia Transfer of Care Note  Patient: Brandy Morales  Procedure(s) Performed: RIGHT EXTRACORPOREAL SHOCK WAVE LITHOTRIPSY (ESWL) (Right)  Patient Location: PACU  Anesthesia Type:MAC  Level of Consciousness: awake, alert , and oriented  Airway & Oxygen Therapy: Patient Spontanous Breathing and Patient connected to nasal cannula oxygen  Post-op Assessment: Report given to RN and Post -op Vital signs reviewed and stable  Post vital signs: Reviewed and stable  Last Vitals:  Vitals Value Taken Time  BP    Temp    Pulse    Resp    SpO2      Last Pain:  Vitals:   10/24/22 0726  TempSrc: Oral  PainSc: 0-No pain      Patients Stated Pain Goal: 5 (10/24/22 0726)  Complications: No notable events documented.

## 2022-10-24 NOTE — Anesthesia Postprocedure Evaluation (Signed)
Anesthesia Post Note  Patient: Brandy Morales  Procedure(s) Performed: RIGHT EXTRACORPOREAL SHOCK WAVE LITHOTRIPSY (ESWL) (Right)     Patient location during evaluation: PACU Anesthesia Type: MAC Level of consciousness: awake Pain management: pain level controlled Vital Signs Assessment: post-procedure vital signs reviewed and stable Respiratory status: spontaneous breathing, nonlabored ventilation and respiratory function stable Cardiovascular status: blood pressure returned to baseline and stable Postop Assessment: no apparent nausea or vomiting Anesthetic complications: no   No notable events documented.  Last Vitals:  Vitals:   10/24/22 0726 10/24/22 1100  BP: (!) 140/80 113/63  Pulse: 85 80  Resp: 18 16  Temp: 36.4 C 36.9 C  SpO2: 95% 97%    Last Pain:  Vitals:   10/24/22 0726  TempSrc: Oral  PainSc: 0-No pain                 Tonika Eden P Kashmir Leedy

## 2022-10-24 NOTE — Brief Op Note (Signed)
10/24/2022  9:29 AM  PATIENT:  Brandy Morales  77 y.o. female  PRE-OPERATIVE DIAGNOSIS:  RIGHT URETERAL STONE  POST-OPERATIVE DIAGNOSIS:  * No post-op diagnosis entered *  PROCEDURE:  Procedure(s): RIGHT EXTRACORPOREAL SHOCK WAVE LITHOTRIPSY (ESWL) (Right)  SURGEON:  Surgeon(s) and Role:    * Natasja Niday, Delbert Phenix., MD - Primary  PHYSICIAN ASSISTANT:   ASSISTANTS: none   ANESTHESIA:   MAC and (with CRNA assist)  EBL:  minimal   BLOOD ADMINISTERED:none  DRAINS: none   LOCAL MEDICATIONS USED:  NONE  SPECIMEN:  No Specimen  DISPOSITION OF SPECIMEN:  N/A  COUNTS:  YES  TOURNIQUET:  * No tourniquets in log *  DICTATION: .Note written in paper chart  PLAN OF CARE: Discharge to home after PACU  PATIENT DISPOSITION:  Short Stay   Delay start of Pharmacological VTE agent (>24hrs) due to surgical blood loss or risk of bleeding: yes

## 2022-10-24 NOTE — H&P (Signed)
Brandy Morales is an 77 y.o. female.    Chief Complaint: Pre-OP RIGHT Shockwave Lithotripsy under MAC  HPI:   1 - RIGHT Ureteral STone - 7mm fusiform stone L4-L5 interspace by CT and KUB 2024 on eval flank pain. She has chronic non-clonal bacteruria, major obesity and COPD.  Today " Brandy Morales" is seen to proceed with RIGHT shockwave lithotrispy for ureteral stone with anesthesia care. No interval fevers. Most recent UCX NON-clonal.   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   On home O2    2 L [place] continuous    Past Surgical History:  Procedure Laterality Date   ABDOMINAL HYSTERECTOMY  age 84   partial 1 ovary left   APPENDECTOMY     colonscopy     CYSTOSCOPY W/ URETERAL STENT PLACEMENT Left 12/30/2020   Procedure: CYSTOSCOPY WITH RETROGRADE PYELOGRAM/URETERAL STENT PLACEMENT;  Surgeon: Malen Gauze, MD;  Location: [facility];  Service: Urology;  Laterality: Left;   CYSTOSCOPY WITH RETROGRADE PYELOGRAM, URETEROSCOPY AND STENT PLACEMENT N/A 11/30/2017   Procedure: CYSTOSCOPY WITH BILATERAL RETROGRADE PYELOGRAM, LEFT URETEROSCOPY/ LEFT LASER LITHO AND LEFT STENT PLACEMENT;  Surgeon: Crista Elliot, MD;  Location: [facility];  Service: Urology;  Laterality: N/A;   CYSTOSCOPY WITH RETROGRADE PYELOGRAM, URETEROSCOPY AND STENT PLACEMENT Left 02/01/2018   Procedure: CYSTOSCOPY WITH LEFT RETROGRADE PYELOGRAM, URETEROSCOPY HOLMIUM LASER AND STENT PLACEMENT;  Surgeon: Crista Elliot, MD;  Location: [facility];  Service: Urology;  Laterality: Left;   EXTRACORPOREAL SHOCK WAVE LITHOTRIPSY Left 02/07/2021   Procedure: LEFT EXTRACORPOREAL SHOCK WAVE LITHOTRIPSY (ESWL);  Surgeon: Crista Elliot, MD;  Location[facility];  Service: Urology;  Laterality: Left;  90 MINS FOR THIS CASE NEED CRNA ON TRUCK  PLEASE CLOSE ROOM 2 AT 11:15 FOR  THIS CASE   EYE SURGERY Bilateral    Cataract   TONSILLECTOMY      History reviewed. No pertinent family history. Social History:  reports that she quit smoking about 8 years ago. Her smoking use included cigarettes. She has a 110.00 pack-year smoking history. She has never used smokeless tobacco. She reports current alcohol use of about 1.0 standard drink of alcohol per week. She reports that she does not use drugs.  Allergies:  Allergies  Allergen Reactions   Statins Cough   Latex Other (See Comments)    Broke out on face   Morphine And Codeine Other (See Comments)    Describes: "feel like I lose control of my body."   Oxycodone Other (See Comments)    Describes: "feels like I lose control of my body"    Medications Prior to Admission  Medication Sig Dispense Refill   albuterol (ACCUNEB) 1.25 MG/3ML nebulizer solution Take 1 ampule by nebulization every 6 (six) hours as needed for wheezing.     albuterol (VENTOLIN HFA) 108 (90 Base) MCG/ACT inhaler Inhale 2 puffs into the lungs every 6 (six) hours as needed for wheezing or shortness of breath.     Artificial Tear Solution (GENTEAL TEARS OP) Place 1 drop into both eyes daily.  atorvastatin (LIPITOR) 10 MG tablet Take 5 mg by mouth 3 (three) times a week. Take on MWF     Budeson-Glycopyrrol-Formoterol (BREZTRI AEROSPHERE) 160-9-4.8 MCG/ACT AERO Inhale 2 puffs into the lungs 2 (two) times daily. 10.7 g 4   cetirizine (ZYRTEC) 10 MG tablet Take 10 mg by mouth daily.     citalopram (CELEXA) 20 MG tablet Take 20 mg by mouth daily.     ezetimibe (ZETIA) 10 MG tablet Take 10 mg by mouth at bedtime.     levalbuterol (XOPENEX) 1.25 MG/3ML nebulizer solution Take 1.25 mg by nebulization every 4 (four) hours as needed for wheezing or shortness of breath. 72 mL 11   losartan  (COZAAR) 25 MG tablet Take 25 mg by mouth daily.     Multiple Vitamin (MULTIVITAMIN) tablet Take 1 tablet by mouth daily.     OXYGEN Inhale 2 L into the lungs continuous.      Probiotic Product (PRO-BIOTIC BLEND PO) Take 1 tablet by mouth daily. [employer] Probiotic Supplement     Cholecalciferol (VITAMIN D3) 5000 units CAPS Take 5,000 Units by mouth once a week.     fluticasone (FLONASE) 50 MCG/ACT nasal spray Place 1 spray into both nostrils daily as needed for allergies.     Spacer/Aero-Holding Chambers (AEROCHAMBER MV) inhaler Use as instructed 1 each 0    No results found for this or any previous visit (from the past 48 hour(s)). No results found.  Review of Systems  Constitutional: Negative.  Negative for chills and fever.  Genitourinary:  Positive for flank pain.    There were no vitals taken for this visit. Physical Exam Vitals reviewed.  Constitutional:      Comments: Obese but frail. Appears older than stated age.   HENT:     Mouth/Throat:     Mouth: Mucous membranes are moist.  Eyes:     Pupils: Pupils are equal, round, and reactive to light.  Abdominal:     Comments: Large truncal obesity limits sensitivity of exam.   Genitourinary:    Comments: Minimal Rt CVAT at present Musculoskeletal:        General: Normal range of motion.  Skin:    General: Skin is warm.  Neurological:     General: No focal deficit present.     Mental Status: She is alert.  Psychiatric:        Mood and Affect: Mood normal.      Assessment/Plan  Proceed as planned with RIGHT shockwave lithotripsy. Risks, benefits, alternatives, expected peri-treatment course discussed in detail. We specifically adressed that her baseline obesity + COPD increases risk of pulmonary complications including pulmonary failure / aspiration / mortality tremendously.   Loletta Parish., MD 10/24/2022, 7:08 AM

## 2022-10-24 NOTE — Anesthesia Preprocedure Evaluation (Addendum)
Anesthesia Evaluation  Patient identified by MRN, date of birth, ID band Patient awake    Reviewed: Allergy & Precautions, NPO status , Patient's Chart, lab work & pertinent test results  Airway Mallampati: II  TM Distance: >3 FB Neck ROM: Full    Dental  (+) Loose, Missing,    Pulmonary asthma , COPD,  COPD inhaler and oxygen dependent, former smoker   Pulmonary exam normal        Cardiovascular hypertension, Pt. on medications Normal cardiovascular exam     Neuro/Psych   Anxiety     negative neurological ROS     GI/Hepatic negative GI ROS, Neg liver ROS,,,  Endo/Other  negative endocrine ROS    Renal/GU Renal disease     Musculoskeletal negative musculoskeletal ROS (+)    Abdominal   Peds  Hematology negative hematology ROS (+)   Anesthesia Other Findings RIGHT URETERAL STONE  Reproductive/Obstetrics                             Anesthesia Physical Anesthesia Plan  ASA: 4  Anesthesia Plan: MAC   Post-op Pain Management:    Induction: Intravenous  PONV Risk Score and Plan: 2 and Ondansetron, Dexamethasone and Treatment may vary due to age or medical condition  Airway Management Planned: Nasal Cannula  Additional Equipment:   Intra-op Plan:   Post-operative Plan:   Informed Consent: I have reviewed the patients History and Physical, chart, labs and discussed the procedure including the risks, benefits and alternatives for the proposed anesthesia with the patient or authorized representative who has indicated his/her understanding and acceptance.     Dental advisory given  Plan Discussed with: CRNA  Anesthesia Plan Comments:        Anesthesia Quick Evaluation

## 2022-10-27 ENCOUNTER — Encounter (HOSPITAL_BASED_OUTPATIENT_CLINIC_OR_DEPARTMENT_OTHER): Payer: Self-pay | Admitting: Urology

## 2022-11-07 DIAGNOSIS — N200 Calculus of kidney: Secondary | ICD-10-CM | POA: Diagnosis not present

## 2022-11-27 DIAGNOSIS — Z1212 Encounter for screening for malignant neoplasm of rectum: Secondary | ICD-10-CM | POA: Diagnosis not present

## 2022-11-27 DIAGNOSIS — Z1211 Encounter for screening for malignant neoplasm of colon: Secondary | ICD-10-CM | POA: Diagnosis not present

## 2022-12-04 DIAGNOSIS — N130 Hydronephrosis with ureteropelvic junction obstruction: Secondary | ICD-10-CM | POA: Diagnosis not present

## 2022-12-04 DIAGNOSIS — N200 Calculus of kidney: Secondary | ICD-10-CM | POA: Diagnosis not present

## 2022-12-08 NOTE — Progress Notes (Deleted)
Synopsis: Referred in June 2021 for abnormal PET scan, Dr. Marchelle Gearing, PCP: By [name], PA  Subjective:   PATIENT ID: Brandy Morales GENDER: female DOB: Aug 25, 1945, MRN: [id number]  No chief complaint on file.   This is a 77 year old female past medical history of severe COPD, chronic oxygen dependent respiratory failure on 2 L pulse POC, asthma, hypertension.  Patient had CT scan imaging which revealed a right upper lobe pulmonary nodule abutting the major fissure.  Patient underwent nuclear medicine pet imaging which revealed a PET avid right upper lobe pulmonary nodule SUV 5.4.  Patient was referred for evaluation of navigational bronchoscopy and tissue sampling.  Patient felt to be not a good candidate for surgical resection due to poor lung function and functional status.  This was discussed with Dr. Marchelle Gearing who is patient's primary pulmonologist.  OV 04/10/2021: I originally saw the patient in June 2021 for nodule consultation.  She was set up for bronchoscopy however declined due to concern for general anesthesia and ultimately sought radiation oncology in August 2021 and completed 54 Gray, 3 fractions of empiric SBRT to this highly suspicious nodule.  No tissue diagnosis was obtained.  Overall from a respiratory standpoint she is doing well.  She is using her portable oxygen.  She does feel like she may have had some decline in her functional status after hospitalization.  But we have not seen her in greater than a year.  She has been trying to stay at home is much as possible.  OV 03/19/2022: Patient here today for COPD follow-up.  She has chronic hypoxemic respiratory failure.  Currently on 2 L oxygen.  I initially saw her for a suspicious nodule.  She has had exacerbations requiring antibiotics and steroids.  She has been off and on steroids and predominantly on steroids at least for the past 3 months.  She is now down to 10 mg of prednisone per day.  She is recently struggling  after hearing her grandson was shot and killed as an Technical sales engineer in [place].  She is feeling somewhat better but she understands that she has had some progressive decline over the past couple of weeks.  OV 05/08/2022: Here today for follow-up for COPD management.  She has chronic hypoxemic respiratory failure on 2 L.  Stable.  She has been on and off antibiotics and steroids.  Was recently started on a slow prednisone taper.  She is down to 5 mg daily.  She is feeling better.  Would like to continue to wean down on prednisone.  She only took azithromycin for couple of weeks.  The prescription ran out and she missed her follow-up to see [place] due to me having to read schedule.  OV 06/09/2022: Here today for follow-up COPD management.  She is chronic hypoxemic respiratory failure on 2 L.  She is been on and off antibiotics and steroids for a very long time.  She is placed on a prolonged steroid taper.  Last time I saw her she was at 5 mg prednisone daily.  We weaned her down to 2.5 mg prednisone and placed her on azithromycin Monday Wednesday Friday.  She had a infection was treated with ciprofloxacin a few weeks ago and she came off of the azithromycin in the meantime.  She feels better she is in no respiratory symptoms at this point.  OV 09/08/2022: Here for today for COPD follow-up.  She has chronic hypoxemic respiratory failure on 2 L.  Had a recent exacerbation requiring antibiotics  and steroids.  They changed her to azithromycin daily followed by Cipro.  She has been using her triple therapy inhaler regimen.  Not using a spacer.  OV 12/08/2022: Here today for follow-up regarding COPD management and her chronic hypoxemic respiratory failure.  Last office visit she was continued on azithromycin and her triple therapy inhaler regimen.  QTc was stable.      Past Medical History:  Diagnosis Date   Anxiety    Asthma    COPD (chronic obstructive pulmonary disease) (HCC)    GERD (gastroesophageal reflux  disease)    History of kidney stones    History of radiation therapy 01/05/2020   IMRT right lung 54 Gy in 3 Fractions 12/29/2019 through 01/05/2020  Dr Antony Blackbird   Hypertension    NSCL CA 10/2019   Right lung   On home O2    2 L [place] continuous     No family history on file.   Past Surgical History:  Procedure Laterality Date   ABDOMINAL HYSTERECTOMY  age 18   partial 1 ovary left   APPENDECTOMY     colonscopy     CYSTOSCOPY W/ URETERAL STENT PLACEMENT Left 12/30/2020   Procedure: CYSTOSCOPY WITH RETROGRADE PYELOGRAM/URETERAL STENT PLACEMENT;  Surgeon: Malen Gauze, MD;  Location: [facility];  Service: Urology;  Laterality: Left;   CYSTOSCOPY WITH RETROGRADE PYELOGRAM, URETEROSCOPY AND STENT PLACEMENT N/A 11/30/2017   Procedure: CYSTOSCOPY WITH BILATERAL RETROGRADE PYELOGRAM, LEFT URETEROSCOPY/ LEFT LASER LITHO AND LEFT STENT PLACEMENT;  Surgeon: Crista Elliot, MD;  Location: [facility];  Service: Urology;  Laterality: N/A;   CYSTOSCOPY WITH RETROGRADE PYELOGRAM, URETEROSCOPY AND STENT PLACEMENT Left 02/01/2018   Procedure: CYSTOSCOPY WITH LEFT RETROGRADE PYELOGRAM, URETEROSCOPY HOLMIUM LASER AND STENT PLACEMENT;  Surgeon: Crista Elliot, MD;  Location: [facility];  Service: Urology;  Laterality: Left;   EXTRACORPOREAL SHOCK WAVE LITHOTRIPSY Left 02/07/2021   Procedure: LEFT EXTRACORPOREAL SHOCK WAVE LITHOTRIPSY (ESWL);  Surgeon: Crista Elliot, MD;  Location: [facility];  Service: Urology;  Laterality: Left;  90 MINS FOR THIS CASE NEED CRNA ON TRUCK  PLEASE CLOSE ROOM 2 AT 11:15 FOR  THIS CASE   EXTRACORPOREAL SHOCK WAVE LITHOTRIPSY Right 10/24/2022   Procedure: RIGHT EXTRACORPOREAL SHOCK WAVE LITHOTRIPSY (ESWL);  Surgeon: Loletta Parish., MD;  Location: [facility];  Service: Urology;  Laterality: Right;   EYE SURGERY Bilateral    Cataract   TONSILLECTOMY      Social History   Socioeconomic History   Marital status: Widowed    Spouse name:  Not on file   Number of children: Not on file   Years of education: Not on file   Highest education level: Not on file  Occupational History   Not on file  Tobacco Use   Smoking status: Former    Current packs/day: 0.00    Average packs/day: 2.0 packs/day for 55.0 years (110.0 ttl pk-yrs)    Types: Cigarettes    Start date: 52    Quit date: 2016    Years since quitting: 8.5   Smokeless tobacco: Never  Vaping Use   Vaping status: Never Used  Substance and Sexual Activity   Alcohol use: Not Currently    Alcohol/week: 1.0 standard drink of alcohol    Types: 1 Glasses of wine per week    Comment: rarely   Drug use: Never   Sexual activity: Not Currently  Other Topics Concern   Not on file  Social History Narrative   Not on file   Social Determinants of Health   Financial Resource Strain: Not on file  Food Insecurity: Not on file  Transportation Needs: Not on file  Physical Activity: Not on file  Stress: Not on file  Social Connections: Unknown (10/01/2021)   Received from [facility], [facility]   Social Network    Social Network: Not on file  Intimate Partner Violence: Unknown (08/27/2021)   Received from [facility], [facility]   HITS    Physically Hurt: Not on file    Insult or Talk Down To: Not on file    Threaten Physical Harm: Not on file    Scream or Curse: Not on file     Allergies  Allergen Reactions   Statins Cough   Latex Other (See Comments)    Broke out on face   Morphine And Codeine Other (See Comments)    Describes: "feel like I lose control of my body."   Oxycodone Other (See Comments)    Describes: "feels like I lose control of my body"     Outpatient Medications Prior to Visit  Medication Sig Dispense Refill   albuterol (ACCUNEB) 1.25 MG/3ML nebulizer solution Take 1 ampule by nebulization every 6 (six) hours as needed for wheezing.     albuterol (VENTOLIN HFA) 108 (90 Base) MCG/ACT inhaler Inhale 2 puffs into the lungs every 6  (six) hours as needed for wheezing or shortness of breath.     Artificial Tear Solution (GENTEAL TEARS OP) Place 1 drop into both eyes daily.      atorvastatin (LIPITOR) 10 MG tablet Take 5 mg by mouth 3 (three) times a week. Take on MWF     azithromycin (ZITHROMAX) 1 g powder Take 1 g by mouth 3 (three) times a week. M, W, F     Budeson-Glycopyrrol-Formoterol (BREZTRI AEROSPHERE) 160-9-4.8 MCG/ACT AERO Inhale 2 puffs into the lungs 2 (two) times daily. 10.7 g 4   cetirizine (ZYRTEC) 10 MG tablet Take 10 mg by mouth daily.     Cholecalciferol (VITAMIN D3) 5000 units CAPS Take 5,000 Units by mouth once a week.     citalopram (CELEXA) 20 MG tablet Take 20 mg by mouth daily.     ezetimibe (ZETIA) 10 MG tablet Take 10 mg by mouth at bedtime.     fluticasone (FLONASE) 50 MCG/ACT nasal spray Place 1 spray into both nostrils daily as needed for allergies.     levalbuterol (XOPENEX) 1.25 MG/3ML nebulizer solution Take 1.25 mg by nebulization every 4 (four) hours as needed for wheezing or shortness of breath. 72 mL 11   losartan (COZAAR) 25 MG tablet Take 25 mg by mouth daily.     Multiple Vitamin (MULTIVITAMIN) tablet Take 1 tablet by mouth daily.     ondansetron (ZOFRAN-ODT) 8 MG disintegrating tablet Take 1 tablet (8 mg total) by mouth every 8 (eight) hours as needed for nausea or vomiting (afer lithotripsy). 10 tablet 1   OXYGEN Inhale 2 L into the lungs continuous.      Probiotic Product (PRO-BIOTIC BLEND PO) Take 1 tablet by mouth daily. [employer] Probiotic Supplement     Spacer/Aero-Holding Chambers (AEROCHAMBER MV) inhaler Use as instructed 1 each 0   traMADol (ULTRAM) 50 MG tablet Take 1-2 tablets (50-100 mg total) by mouth every 6 (six) hours as needed for moderate pain or severe pain (after lithotripsy). 15 tablet 0   No facility-administered medications prior to visit.  Review of Systems  Constitutional:  Negative for chills, fever, malaise/fatigue and weight loss.  HENT:   Negative for hearing loss, sore throat and tinnitus.   Eyes:  Negative for blurred vision and double vision.  Respiratory:  Positive for cough and shortness of breath. Negative for hemoptysis, sputum production, wheezing and stridor.   Cardiovascular:  Negative for chest pain, palpitations, orthopnea, leg swelling and PND.  Gastrointestinal:  Negative for abdominal pain, constipation, diarrhea, heartburn, nausea and vomiting.  Genitourinary:  Negative for dysuria, hematuria and urgency.  Musculoskeletal:  Negative for joint pain and myalgias.  Skin:  Negative for itching and rash.  Neurological:  Negative for dizziness, tingling, weakness and headaches.  Endo/Heme/Allergies:  Negative for environmental allergies. Does not bruise/bleed easily.  Psychiatric/Behavioral:  Negative for depression. The patient is not nervous/anxious and does not have insomnia.   All other systems reviewed and are negative.    Objective:  Physical Exam Vitals reviewed.  Constitutional:      General: She is not in acute distress.    Appearance: She is well-developed.  HENT:     Head: Normocephalic and atraumatic.  Eyes:     General: No scleral icterus.    Conjunctiva/sclera: Conjunctivae normal.     Pupils: Pupils are equal, round, and reactive to light.  Neck:     Vascular: No JVD.     Trachea: No tracheal deviation.  Cardiovascular:     Rate and Rhythm: Normal rate and regular rhythm.     Heart sounds: Normal heart sounds. No murmur heard. Pulmonary:     Effort: Pulmonary effort is normal. No tachypnea, accessory muscle usage or respiratory distress.     Breath sounds: No stridor. No wheezing, rhonchi or rales.  Abdominal:     General: There is no distension.     Palpations: Abdomen is soft.     Tenderness: There is no abdominal tenderness.  Musculoskeletal:        General: No tenderness.     Cervical back: Neck supple.  Lymphadenopathy:     Cervical: No cervical adenopathy.  Skin:    General:  Skin is warm and dry.     Capillary Refill: Capillary refill takes less than 2 seconds.     Findings: No rash.  Neurological:     Mental Status: She is alert and oriented to person, place, and time.  Psychiatric:        Behavior: Behavior normal.      There were no vitals filed for this visit.     on  RA BMI Readings from Last 3 Encounters:  10/24/22 28.85 kg/m  09/29/22 29.05 kg/m  09/08/22 29.18 kg/m   Wt Readings from Last 3 Encounters:  10/24/22 168 lb 1.6 oz (76.2 kg)  09/29/22 169 lb 4 oz (76.8 kg)  09/08/22 170 lb (77.1 kg)     CBC    Component Value Date/Time   WBC 8.5 01/02/2021 0503   RBC 4.40 01/02/2021 0503   RBC 4.32 01/02/2021 0503   HGB 12.3 01/02/2021 0503   HCT 39.5 01/02/2021 0503   PLT 145 (L) 01/02/2021 0503   MCV 91.4 01/02/2021 0503   MCH 28.5 01/02/2021 0503   MCHC 31.1 01/02/2021 0503   RDW 14.3 01/02/2021 0503   LYMPHSABS 0.5 (L) 12/29/2020 0657   MONOABS 0.8 12/29/2020 0657   EOSABS 0.0 12/29/2020 0657   BASOSABS 0.0 12/29/2020 0657     Chest Imaging: 09/14/2019 CT chest: Right upper lobe spiculated pulmonary nodule abutting major  fissure. The patient's images have been independently reviewed by me.    10/12/2019 nuclear medicine pet imaging. Right upper lobe PET avid pulmonary nodule SUV 5.45, 11 mm in largest cross-section, mild tenting of the fissure line. Concerning for primary bronchogenic carcinoma. The patient's images have been independently reviewed by me.    11/30/2020 CT chest: Treated nodule right upper lobe decreased size post radiation treatments Enlarging 7 mm peribronchovascular nodule in the left lower lobe. The patient's images have been independently reviewed by me.    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  Increased ill-defined groundglass nodules in the left upper lobe.  Likely inflammatory. Evidence  of emphysema. The patient's images have been independently reviewed by me.    January 2024 CT chest: Nodule in the right lung slightly increased in the size with a central solid component has a right upper lobe nodule slight increase. CT 22-month follow-up recommended. The patient's images have been independently reviewed by me.     Pulmonary Functions Testing Results:    Latest Ref Rng & Units 05/14/2017   10:49 AM  PFT Results  FVC-Pre L 1.50   FVC-Predicted Pre % 48   Pre FEV1/FVC % % 42   FEV1-Pre L 0.63   FEV1-Predicted Pre % 26   DLCO uncorrected ml/min/mmHg 9.57   DLCO UNC% % 37   DLCO corrected ml/min/mmHg 9.09   DLCO COR %Predicted % 35   DLVA Predicted % 45   TLC L 9.98   TLC % Predicted % 192   RV % Predicted % 366     FeNO: none  Pathology: none  Echocardiogram: none  Heart Catheterization: none    Assessment & Plan:   No diagnosis found.    Discussion:  77 year old female, advanced COPD, stage IV disease, chronic hypoxemic respiratory failure on triple therapy inhaler regimen.  Weaned off of prednisone.  Plan: Azithromycin 250 mg Monday Wednesday Friday Continue triple therapy inhaler regimen with Breztri plus spacer Repeat EKG today, QTc stable. RTC in 3 months. If she continues to have ongoing symptoms would consider movement to triple therapy nebulizer regimen.    Current Outpatient Medications:    albuterol (ACCUNEB) 1.25 MG/3ML nebulizer solution, Take 1 ampule by nebulization every 6 (six) hours as needed for wheezing., Disp: , Rfl:    albuterol (VENTOLIN HFA) 108 (90 Base) MCG/ACT inhaler, Inhale 2 puffs into the lungs every 6 (six) hours as needed for wheezing or shortness of breath., Disp: , Rfl:    Artificial Tear Solution (GENTEAL TEARS OP), Place 1 drop into both eyes daily. , Disp: , Rfl:    atorvastatin (LIPITOR) 10 MG tablet, Take 5 mg by mouth 3 (three) times a week. Take on MWF, Disp: , Rfl:    azithromycin (ZITHROMAX) 1 g  powder, Take 1 g by mouth 3 (three) times a week. M, W, F, Disp: , Rfl:    Budeson-Glycopyrrol-Formoterol (BREZTRI AEROSPHERE) 160-9-4.8 MCG/ACT AERO, Inhale 2 puffs into the lungs 2 (two) times daily., Disp: 10.7 g, Rfl: 4   cetirizine (ZYRTEC) 10 MG tablet, Take 10 mg by mouth daily., Disp: , Rfl:    Cholecalciferol (VITAMIN D3) 5000 units CAPS, Take 5,000 Units by mouth once a week., Disp: , Rfl:    citalopram (CELEXA) 20 MG tablet, Take 20 mg by mouth daily., Disp: , Rfl:    ezetimibe (ZETIA) 10 MG tablet, Take 10 mg by mouth at bedtime., Disp: ,  Rfl:    fluticasone (FLONASE) 50 MCG/ACT nasal spray, Place 1 spray into both nostrils daily as needed for allergies., Disp: , Rfl:    levalbuterol (XOPENEX) 1.25 MG/3ML nebulizer solution, Take 1.25 mg by nebulization every 4 (four) hours as needed for wheezing or shortness of breath., Disp: 72 mL, Rfl: 11   losartan (COZAAR) 25 MG tablet, Take 25 mg by mouth daily., Disp: , Rfl:    Multiple Vitamin (MULTIVITAMIN) tablet, Take 1 tablet by mouth daily., Disp: , Rfl:    ondansetron (ZOFRAN-ODT) 8 MG disintegrating tablet, Take 1 tablet (8 mg total) by mouth every 8 (eight) hours as needed for nausea or vomiting (afer lithotripsy)., Disp: 10 tablet, Rfl: 1   OXYGEN, Inhale 2 L into the lungs continuous. , Disp: , Rfl:    Probiotic Product (PRO-BIOTIC BLEND PO), Take 1 tablet by mouth daily. [employer] Probiotic Supplement, Disp: , Rfl:    Spacer/Aero-Holding Chambers (AEROCHAMBER MV) inhaler, Use as instructed, Disp: 1 each, Rfl: 0   traMADol (ULTRAM) 50 MG tablet, Take 1-2 tablets (50-100 mg total) by mouth every 6 (six) hours as needed for moderate pain or severe pain (after lithotripsy)., Disp: 15 tablet, Rfl: 0   Josephine Igo, DO  Pulmonary Critical Care 12/08/2022 6:17 PM

## 2022-12-09 ENCOUNTER — Ambulatory Visit: Payer: PPO | Admitting: Pulmonary Disease

## 2023-01-15 DIAGNOSIS — N1339 Other hydronephrosis: Secondary | ICD-10-CM | POA: Diagnosis not present

## 2023-01-19 ENCOUNTER — Encounter: Payer: Self-pay | Admitting: Pulmonary Disease

## 2023-01-19 ENCOUNTER — Telehealth: Payer: Self-pay | Admitting: Pulmonary Disease

## 2023-01-19 ENCOUNTER — Ambulatory Visit: Payer: PPO | Admitting: Pulmonary Disease

## 2023-01-19 VITALS — BP 100/80 | HR 87 | Ht 64.0 in | Wt 169.4 lb

## 2023-01-19 DIAGNOSIS — J449 Chronic obstructive pulmonary disease, unspecified: Secondary | ICD-10-CM

## 2023-01-19 DIAGNOSIS — J9611 Chronic respiratory failure with hypoxia: Secondary | ICD-10-CM | POA: Diagnosis not present

## 2023-01-19 MED ORDER — AZITHROMYCIN 250 MG PO TABS: 250.0000 mg | ORAL_TABLET | ORAL | 0 refills | Status: DC

## 2023-01-19 NOTE — Telephone Encounter (Signed)
Pt. Was seen today and her drug store is closing and needs more refills for azithromycin (ZITHROMAX) 1 g powder  and sent to [facility] on [address] And switch the Brezti over 2 to that pharmacy

## 2023-01-19 NOTE — Progress Notes (Signed)
Synopsis: Referred in June 2021 for abnormal PET scan, Dr. Marchelle Gearing, PCP: By [name], PA  Subjective:   PATIENT ID: Brandy Morales GENDER: female DOB: 1945-10-23, MRN: [id number]  Chief Complaint  Patient presents with   Follow-up    3 months f/up    This is a 77 year old female past medical history of severe COPD, chronic oxygen dependent respiratory failure on 2 L pulse POC, asthma, hypertension.  Patient had CT scan imaging which revealed a right upper lobe pulmonary nodule abutting the major fissure.  Patient underwent nuclear medicine pet imaging which revealed a PET avid right upper lobe pulmonary nodule SUV 5.4.  Patient was referred for evaluation of navigational bronchoscopy and tissue sampling.  Patient felt to be not a good candidate for surgical resection due to poor lung function and functional status.  This was discussed with Dr. Marchelle Gearing who is patient's primary pulmonologist.  OV 04/10/2021: I originally saw the patient in June 2021 for nodule consultation.  She was set up for bronchoscopy however declined due to concern for general anesthesia and ultimately sought radiation oncology in August 2021 and completed 54 Gray, 3 fractions of empiric SBRT to this highly suspicious nodule.  No tissue diagnosis was obtained.  Overall from a respiratory standpoint she is doing well.  She is using her portable oxygen.  She does feel like she may have had some decline in her functional status after hospitalization.  But we have not seen her in greater than a year.  She has been trying to stay at home is much as possible.  OV 03/19/2022: Patient here today for COPD follow-up.  She has chronic hypoxemic respiratory failure.  Currently on 2 L oxygen.  I initially saw her for a suspicious nodule.  She has had exacerbations requiring antibiotics and steroids.  She has been off and on steroids and predominantly on steroids at least for the past 3 months.  She is now down to 10 mg of  prednisone per day.  She is recently struggling after hearing her grandson was shot and killed as an Technical sales engineer in [place].  She is feeling somewhat better but she understands that she has had some progressive decline over the past couple of weeks.  OV 05/08/2022: Here today for follow-up for COPD management.  She has chronic hypoxemic respiratory failure on 2 L.  Stable.  She has been on and off antibiotics and steroids.  Was recently started on a slow prednisone taper.  She is down to 5 mg daily.  She is feeling better.  Would like to continue to wean down on prednisone.  She only took azithromycin for couple of weeks.  The prescription ran out and she missed her follow-up to see [place] due to me having to read schedule.  OV 06/09/2022: Here today for follow-up COPD management.  She is chronic hypoxemic respiratory failure on 2 L.  She is been on and off antibiotics and steroids for a very long time.  She is placed on a prolonged steroid taper.  Last time I saw her she was at 5 mg prednisone daily.  We weaned her down to 2.5 mg prednisone and placed her on azithromycin Monday Wednesday Friday.  She had a infection was treated with ciprofloxacin a few weeks ago and she came off of the azithromycin in the meantime.  She feels better she is in no respiratory symptoms at this point.  OV 09/08/2022: Here for today for COPD follow-up.  She has chronic hypoxemic respiratory  failure on 2 L.  Had a recent exacerbation requiring antibiotics and steroids.  They changed her to azithromycin daily followed by Cipro.  She has been using her triple therapy inhaler regimen.  Not using a spacer.  OV 01/19/2023: Here today for follow-up regarding management of COPD.  CurrentPatient required onset 2 L nasal cannula.  For her chronic oHypoxemic respiratory failure.  She Multiple has had multiple exacerbations Over the pastYear.  But after starting her on azithromycin Monday Wednesday Friday she has not had an exacerbation.  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   Past Surgical History:  Procedure Laterality Date   ABDOMINAL HYSTERECTOMY  age 38   partial 1 ovary left   APPENDECTOMY     colonscopy     CYSTOSCOPY W/ URETERAL STENT PLACEMENT Left 12/30/2020   Procedure: CYSTOSCOPY WITH RETROGRADE PYELOGRAM/URETERAL STENT PLACEMENT;  Surgeon: Malen Gauze, MD;  Location: [facility];  Service: Urology;  Laterality: Left;   CYSTOSCOPY WITH RETROGRADE PYELOGRAM, URETEROSCOPY AND STENT PLACEMENT N/A 11/30/2017   Procedure: CYSTOSCOPY WITH BILATERAL RETROGRADE PYELOGRAM, LEFT URETEROSCOPY/ LEFT LASER LITHO AND LEFT STENT PLACEMENT;  Surgeon: Crista Elliot, MD;  Location: [facility];  Service: Urology;  Laterality: N/A;   CYSTOSCOPY WITH RETROGRADE PYELOGRAM, URETEROSCOPY AND STENT PLACEMENT Left 02/01/2018   Procedure: CYSTOSCOPY WITH LEFT RETROGRADE PYELOGRAM, URETEROSCOPY HOLMIUM LASER AND STENT PLACEMENT;  Surgeon: Crista Elliot, MD;  Location: [facility];  Service: Urology;  Laterality: Left;   EXTRACORPOREAL SHOCK WAVE LITHOTRIPSY Left 02/07/2021   Procedure: LEFT EXTRACORPOREAL SHOCK WAVE LITHOTRIPSY (ESWL);  Surgeon: Crista Elliot, MD;  Location: [facility];  Service: Urology;  Laterality: Left;  90 MINS FOR THIS CASE NEED CRNA ON TRUCK  PLEASE CLOSE ROOM 2 AT 11:15 FOR  THIS CASE   EXTRACORPOREAL SHOCK WAVE LITHOTRIPSY Right 10/24/2022   Procedure: RIGHT EXTRACORPOREAL SHOCK WAVE LITHOTRIPSY (ESWL);  Surgeon: Loletta Parish., MD;  Location: [facility];   Service: Urology;  Laterality: Right;   EYE SURGERY Bilateral    Cataract   TONSILLECTOMY      Social History   Socioeconomic History   Marital status: Widowed    Spouse name: Not on file   Number of children: Not on file   Years of education: Not on file   Highest education level: Not on file  Occupational History   Not on file  Tobacco Use   Smoking status: Former    Current packs/day: 0.00    Average packs/day: 2.0 packs/day for 55.0 years (110.0 ttl pk-yrs)    Types: Cigarettes    Start date: 20    Quit date: 2016    Years since quitting: 8.6   Smokeless tobacco: Never  Vaping Use   Vaping status: Never Used  Substance and Sexual Activity   Alcohol use: Not Currently    Alcohol/week: 1.0 standard drink of alcohol  Types: 1 Glasses of wine per week    Comment: rarely   Drug use: Never   Sexual activity: Not Currently  Other Topics Concern   Not on file  Social History Narrative   Not on file   Social Determinants of Health   Financial Resource Strain: Not on file  Food Insecurity: Not on file  Transportation Needs: Not on file  Physical Activity: Not on file  Stress: Not on file  Social Connections: Unknown (10/01/2021)   Received from [facility], [facility]   Social Network    Social Network: Not on file  Intimate Partner Violence: Unknown (08/27/2021)   Received from [facility], [facility]   HITS    Physically Hurt: Not on file    Insult or Talk Down To: Not on file    Threaten Physical Harm: Not on file    Scream or Curse: Not on file     Allergies  Allergen Reactions   Statins Cough   Latex Other (See Comments)    Broke out on face   Morphine And Codeine Other (See Comments)    Describes: "feel like I lose control of my body."   Oxycodone Other (See Comments)    Describes: "feels like I lose control of my body"     Outpatient Medications Prior to Visit  Medication Sig Dispense Refill   albuterol (ACCUNEB) 1.25 MG/3ML nebulizer  solution Take 1 ampule by nebulization every 6 (six) hours as needed for wheezing.     albuterol (VENTOLIN HFA) 108 (90 Base) MCG/ACT inhaler Inhale 2 puffs into the lungs every 6 (six) hours as needed for wheezing or shortness of breath.     Artificial Tear Solution (GENTEAL TEARS OP) Place 1 drop into both eyes daily.      atorvastatin (LIPITOR) 10 MG tablet Take 5 mg by mouth 3 (three) times a week. Take on MWF     azithromycin (ZITHROMAX) 1 g powder Take 1 g by mouth 3 (three) times a week. M, W, F     Budeson-Glycopyrrol-Formoterol (BREZTRI AEROSPHERE) 160-9-4.8 MCG/ACT AERO Inhale 2 puffs into the lungs 2 (two) times daily. 10.7 g 4   cetirizine (ZYRTEC) 10 MG tablet Take 10 mg by mouth daily.     Cholecalciferol (VITAMIN D3) 5000 units CAPS Take 5,000 Units by mouth once a week.     citalopram (CELEXA) 20 MG tablet Take 20 mg by mouth daily.     Cyanocobalamin (VITAMIN B-12) 1000 MCG SUBL Take 1,000 mcg by mouth daily.     ezetimibe (ZETIA) 10 MG tablet Take 10 mg by mouth at bedtime.     fluticasone (FLONASE) 50 MCG/ACT nasal spray Place 1 spray into both nostrils daily as needed for allergies.     levalbuterol (XOPENEX) 1.25 MG/3ML nebulizer solution Take 1.25 mg by nebulization every 4 (four) hours as needed for wheezing or shortness of breath. 72 mL 11   losartan (COZAAR) 25 MG tablet Take 25 mg by mouth daily.     Multiple Vitamin (MULTIVITAMIN) tablet Take 1 tablet by mouth daily.     OXYGEN Inhale 2 L into the lungs continuous.      phenazopyridine (PYRIDIUM) 95 MG tablet Take 95 mg by mouth 3 (three) times daily as needed for pain.     Probiotic Product (PRO-BIOTIC BLEND PO) Take 1 tablet by mouth daily. [employer] Probiotic Supplement     ondansetron (ZOFRAN-ODT) 8 MG disintegrating tablet Take 1 tablet (8 mg total) by  mouth every 8 (eight) hours as needed for nausea or vomiting (afer lithotripsy). 10 tablet 1   Spacer/Aero-Holding Chambers (AEROCHAMBER MV) inhaler Use  as instructed 1 each 0   traMADol (ULTRAM) 50 MG tablet Take 1-2 tablets (50-100 mg total) by mouth every 6 (six) hours as needed for moderate pain or severe pain (after lithotripsy). 15 tablet 0   No facility-administered medications prior to visit.    Review of Systems  Constitutional:  Negative for chills, fever, malaise/fatigue and weight loss.  HENT:  Negative for hearing loss, sore throat and tinnitus.   Eyes:  Negative for blurred vision and double vision.  Respiratory:  Positive for cough and shortness of breath. Negative for hemoptysis, sputum production, wheezing and stridor.   Cardiovascular:  Negative for chest pain, palpitations, orthopnea, leg swelling and PND.  Gastrointestinal:  Negative for abdominal pain, constipation, diarrhea, heartburn, nausea and vomiting.  Genitourinary:  Negative for dysuria, hematuria and urgency.  Musculoskeletal:  Negative for joint pain and myalgias.  Skin:  Negative for itching and rash.  Neurological:  Negative for dizziness, tingling, weakness and headaches.  Endo/Heme/Allergies:  Negative for environmental allergies. Does not bruise/bleed easily.  Psychiatric/Behavioral:  Negative for depression. The patient is not nervous/anxious and does not have insomnia.   All other systems reviewed and are negative.    Objective:  Physical Exam Vitals reviewed.  Constitutional:      General: She is not in acute distress.    Appearance: She is well-developed.  HENT:     Head: Normocephalic and atraumatic.     Mouth/Throat:     Pharynx: No oropharyngeal exudate.  Eyes:     Conjunctiva/sclera: Conjunctivae normal.     Pupils: Pupils are equal, round, and reactive to light.  Neck:     Vascular: No JVD.     Trachea: No tracheal deviation.     Comments: Loss of supraclavicular fat Cardiovascular:     Rate and Rhythm: Normal rate and regular rhythm.     Heart sounds: S1 normal and S2 normal.     Comments: Distant heart tones Pulmonary:      Effort: No tachypnea or accessory muscle usage.     Breath sounds: No stridor. Decreased breath sounds (throughout all lung fields) present. No wheezing, rhonchi or rales.  Abdominal:     General: Bowel sounds are normal. There is no distension.     Palpations: Abdomen is soft.     Tenderness: There is no abdominal tenderness.  Musculoskeletal:        General: Deformity (muscle wasting ) present.  Skin:    General: Skin is warm and dry.     Capillary Refill: Capillary refill takes less than 2 seconds.     Findings: No rash.  Neurological:     Mental Status: She is alert and oriented to person, place, and time.  Psychiatric:        Behavior: Behavior normal.      Vitals:   01/19/23 1143  BP: 100/80  Pulse: 87  SpO2: 99%  Weight: 169 lb 6.4 oz (76.8 kg)  Height: 5\' 4"  (1.626 m)    99% on  RA BMI Readings from Last 3 Encounters:  01/19/23 29.08 kg/m  10/24/22 28.85 kg/m  09/29/22 29.05 kg/m   Wt Readings from Last 3 Encounters:  01/19/23 169 lb 6.4 oz (76.8 kg)  10/24/22 168 lb 1.6 oz (76.2 kg)  09/29/22 169 lb 4 oz (76.8 kg)     CBC  Component Value Date/Time   WBC 8.5 01/02/2021 0503   RBC 4.40 01/02/2021 0503   RBC 4.32 01/02/2021 0503   HGB 12.3 01/02/2021 0503   HCT 39.5 01/02/2021 0503   PLT 145 (L) 01/02/2021 0503   MCV 91.4 01/02/2021 0503   MCH 28.5 01/02/2021 0503   MCHC 31.1 01/02/2021 0503   RDW 14.3 01/02/2021 0503   LYMPHSABS 0.5 (L) 12/29/2020 0657   MONOABS 0.8 12/29/2020 0657   EOSABS 0.0 12/29/2020 0657   BASOSABS 0.0 12/29/2020 0657     Chest Imaging: 09/14/2019 CT chest: Right upper lobe spiculated pulmonary nodule abutting major fissure. The patient's images have been independently reviewed by me.    10/12/2019 nuclear medicine pet imaging. Right upper lobe PET avid pulmonary nodule SUV 5.45, 11 mm in largest cross-section, mild tenting of the fissure line. Concerning for primary bronchogenic carcinoma. The patient's images have  been independently reviewed by me.    11/30/2020 CT chest: Treated nodule right upper lobe decreased size post radiation treatments Enlarging 7 mm peribronchovascular nodule in the left lower lobe. The patient's images have been independently reviewed by me.    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  Increased ill-defined groundglass nodules in the left upper lobe.  Likely inflammatory. Evidence of emphysema. The patient's images have been independently reviewed by me.    January 2024 CT chest: Nodule in the right lung slightly increased in the size with a central solid component has a right upper lobe nodule slight increase. CT 18-month follow-up recommended. The patient's images have been independently reviewed by me.     Pulmonary Functions Testing Results:    Latest Ref Rng & Units 05/14/2017   10:49 AM  PFT Results  FVC-Pre L 1.50   FVC-Predicted Pre % 48   Pre FEV1/FVC % % 42   FEV1-Pre L 0.63   FEV1-Predicted Pre % 26   DLCO uncorrected ml/min/mmHg 9.57   DLCO UNC% % 37   DLCO corrected ml/min/mmHg 9.09   DLCO COR %Predicted % 35   DLVA Predicted % 45   TLC L 9.98   TLC % Predicted % 192   RV % Predicted % 366     FeNO: none  Pathology: none  Echocardiogram: none  Heart Catheterization: none    Assessment & Plan:     ICD-10-CM   1. Stage 4 very severe COPD by GOLD classification (HCC)  J44.9     2. Chronic respiratory failure with hypoxia (HCC)  J96.11       Discussion:  This is a very pleasant 77 year old female with advanced COPD, stage IV, Chronic hypoxemic respiratory failure on triple therapy inhaler regimenSuccessfully weaned off ofZone now on azithromycin mycin Monday Wednesday Friday.  Pain: Continue azithromycinOn Monday Wednesday Friday to 50 mgContinue triple therapy and ala regimen withBreztri plus space third PriorEKGs are stable  with QTc  No reason to continue continue to repeatt this.  Follow-up with [place] in 6 months or as needed. Encouraged use of saline nasal spray to help with dryness in her nose.    Current Outpatient Medications:    albuterol (ACCUNEB) 1.25 MG/3ML nebulizer solution, Take 1 ampule by nebulization every 6 (six) hours as needed for wheezing., Disp: , Rfl:    albuterol (VENTOLIN HFA) 108 (90 Base) MCG/ACT inhaler, Inhale 2 puffs into the lungs every 6 (six) hours as needed for wheezing or shortness of breath., Disp: , Rfl:  Artificial Tear Solution (GENTEAL TEARS OP), Place 1 drop into both eyes daily. , Disp: , Rfl:    atorvastatin (LIPITOR) 10 MG tablet, Take 5 mg by mouth 3 (three) times a week. Take on MWF, Disp: , Rfl:    azithromycin (ZITHROMAX) 1 g powder, Take 1 g by mouth 3 (three) times a week. M, W, F, Disp: , Rfl:    Budeson-Glycopyrrol-Formoterol (BREZTRI AEROSPHERE) 160-9-4.8 MCG/ACT AERO, Inhale 2 puffs into the lungs 2 (two) times daily., Disp: 10.7 g, Rfl: 4   cetirizine (ZYRTEC) 10 MG tablet, Take 10 mg by mouth daily., Disp: , Rfl:    Cholecalciferol (VITAMIN D3) 5000 units CAPS, Take 5,000 Units by mouth once a week., Disp: , Rfl:    citalopram (CELEXA) 20 MG tablet, Take 20 mg by mouth daily., Disp: , Rfl:    Cyanocobalamin (VITAMIN B-12) 1000 MCG SUBL, Take 1,000 mcg by mouth daily., Disp: , Rfl:    ezetimibe (ZETIA) 10 MG tablet, Take 10 mg by mouth at bedtime., Disp: , Rfl:    fluticasone (FLONASE) 50 MCG/ACT nasal spray, Place 1 spray into both nostrils daily as needed for allergies., Disp: , Rfl:    levalbuterol (XOPENEX) 1.25 MG/3ML nebulizer solution, Take 1.25 mg by nebulization every 4 (four) hours as needed for wheezing or shortness of breath., Disp: 72 mL, Rfl: 11   losartan (COZAAR) 25 MG tablet, Take 25 mg by mouth daily., Disp: , Rfl:    Multiple Vitamin (MULTIVITAMIN) tablet, Take 1 tablet by mouth daily., Disp: , Rfl:    OXYGEN, Inhale 2 L into the lungs  continuous. , Disp: , Rfl:    phenazopyridine (PYRIDIUM) 95 MG tablet, Take 95 mg by mouth 3 (three) times daily as needed for pain., Disp: , Rfl:    Probiotic Product (PRO-BIOTIC BLEND PO), Take 1 tablet by mouth daily. [employer] Probiotic Supplement, Disp: , Rfl:    Josephine Igo, DO [facility] 01/19/2023 11:59 AM

## 2023-01-19 NOTE — Telephone Encounter (Signed)
Orders placed  BLI

## 2023-01-19 NOTE — Patient Instructions (Signed)
Thank you for visiting Dr. Tonia Brooms at [facility]. Today we recommend the following:  Stay on current regimen  Return in about 6 months (around 07/22/2023) for with APP or Dr. Tonia Brooms.    Please do your part to reduce the spread of COVID-19.

## 2023-01-21 ENCOUNTER — Other Ambulatory Visit: Payer: Self-pay

## 2023-01-21 MED ORDER — BREZTRI AEROSPHERE 160-9-4.8 MCG/ACT IN AERO: 2.0000 | INHALATION_SPRAY | Freq: Two times a day (BID) | RESPIRATORY_TRACT | 6 refills | Status: DC

## 2023-01-21 MED ORDER — AZITHROMYCIN 250 MG PO TABS: 250.0000 mg | ORAL_TABLET | ORAL | 0 refills | Status: DC

## 2023-01-21 NOTE — Telephone Encounter (Signed)
ATC X1 LVM for patient advising breztri and antibiotic have been sent to correct pharmacy

## 2023-01-21 NOTE — Telephone Encounter (Signed)
Atc x1 lvm advising breztri and antibiotic have been sent to correct pharmacy

## 2023-02-02 ENCOUNTER — Other Ambulatory Visit: Payer: Self-pay | Admitting: Pulmonary Disease

## 2023-02-25 ENCOUNTER — Telehealth: Payer: Self-pay | Admitting: Pulmonary Disease

## 2023-02-25 MED ORDER — LEVALBUTEROL HCL 1.25 MG/3ML IN NEBU: 1.2500 mg | INHALATION_SOLUTION | RESPIRATORY_TRACT | 11 refills | Status: AC | PRN

## 2023-02-25 NOTE — Telephone Encounter (Signed)
Pt called in wanting a refill for levalbuterol (XOPENEX) 1.25 MG/3ML nebulizer solution   Pharmacy: [facility] on [address]

## 2023-02-25 NOTE — Telephone Encounter (Signed)
I have refilled the xopenex  Pt aware  Nothing further needed

## 2023-03-09 ENCOUNTER — Telehealth: Payer: Self-pay | Admitting: Pulmonary Disease

## 2023-03-09 NOTE — Telephone Encounter (Signed)
Patient states is in the donut hole. Would like two samples of Breztri. Patient phone number is [phone] and [phone].

## 2023-03-17 MED ORDER — BREZTRI AEROSPHERE 160-9-4.8 MCG/ACT IN AERO: 2.0000 | INHALATION_SPRAY | Freq: Two times a day (BID) | RESPIRATORY_TRACT | Status: DC

## 2023-03-17 NOTE — Telephone Encounter (Signed)
2 samples breztri up front for pick up  Pt aware  Nothing further needed

## 2023-03-17 NOTE — Addendum Note (Signed)
Addended by: Christen Butter on: 03/17/2023 04:31 PM   Modules accepted: Orders

## 2023-04-01 ENCOUNTER — Ambulatory Visit (HOSPITAL_COMMUNITY)
Admission: RE | Admit: 2023-04-01 | Discharge: 2023-04-01 | Disposition: A | Discharge status: Home / Self Care | Payer: PPO | Source: Ambulatory Visit | Attending: Radiation Oncology | Admitting: Radiation Oncology

## 2023-04-01 ENCOUNTER — Encounter (HOSPITAL_COMMUNITY): Payer: Self-pay

## 2023-04-01 DIAGNOSIS — R918 Other nonspecific abnormal finding of lung field: Secondary | ICD-10-CM | POA: Diagnosis not present

## 2023-04-01 DIAGNOSIS — J439 Emphysema, unspecified: Secondary | ICD-10-CM | POA: Diagnosis not present

## 2023-04-01 DIAGNOSIS — C3490 Malignant neoplasm of unspecified part of unspecified bronchus or lung: Secondary | ICD-10-CM | POA: Diagnosis not present

## 2023-04-01 DIAGNOSIS — I700 Atherosclerosis of aorta: Secondary | ICD-10-CM | POA: Diagnosis not present

## 2023-04-05 NOTE — Progress Notes (Signed)
Radiation Oncology         [phone] ________________________________  Name: Brandy Morales MRN: [id number]  Date: 04/06/2023  DOB: 08/22/1945  Follow-Up Visit Note  CC: [name], [place]  Kalman Shan, MD  No diagnosis found.   Diagnosis: PET avid right upper lobe spiculated pulmonary nodule concerning for primary bronchogenic carcinoma    Interval Since Last Radiation: 3 years, 2 months, and 29 days   Radiation Treatment Dates: 12/29/2019 through 01/05/2020 Site Technique Total Dose (Gy) Dose per Fx (Gy) Completed Fx Beam Energies  Lung, Right: Lung_Rt SBRT 54/54 18 3/3 6XFFF   Narrative:  The patient returns today for routine follow-up. Since her last visit, she underwent surveillance chest CT on 04/01/23 showing: slight enlargement of focal nodular area in RUL, uncertain etiology; no new additional areas of nodularity or lymph node enlargement. Short-term follow up in 3 months was recommended.  She also continues to follow up with Dr. Tonia Brooms for her history of COPD and recent exacerbation. She last saw Dr. Tonia Brooms on 01/19/23, and Dr. Fara Olden recommended that she continue with Azithromycin 250 mg on MWF, and her triple therapy inhaler regimen with Breztri plus spacer.  On evaluation today the patient reports no new issues.  She continues on 2 L of oxygen 24 hours a day. ***                       Allergies:  is allergic to statins, latex, morphine and codeine, and oxycodone.  Meds: Current Outpatient Medications  Medication Sig Dispense Refill   albuterol (ACCUNEB) 1.25 MG/3ML nebulizer solution Take 1 ampule by nebulization every 6 (six) hours as needed for wheezing.     albuterol (VENTOLIN HFA) 108 (90 Base) MCG/ACT inhaler Inhale 2 puffs into the lungs every 6 (six) hours as needed for wheezing or shortness of breath.     Artificial Tear Solution (GENTEAL TEARS OP) Place 1 drop into both eyes daily.      atorvastatin (LIPITOR) 10 MG tablet Take 5 mg by mouth 3  (three) times a week. Take on MWF     azithromycin (ZITHROMAX) 1 g powder Take 1 g by mouth 3 (three) times a week. M, W, F     azithromycin (ZITHROMAX) 250 MG tablet TAKE 1 TABLET (250 MG TOTAL) BY MOUTH EVERY MONDAY, WEDNESDAY, AND FRIDAY. 153 tablet 0   Budeson-Glycopyrrol-Formoterol (BREZTRI AEROSPHERE) 160-9-4.8 MCG/ACT AERO Inhale 2 puffs into the lungs 2 (two) times daily. 10.7 g 6   Budeson-Glycopyrrol-Formoterol (BREZTRI AEROSPHERE) 160-9-4.8 MCG/ACT AERO Inhale 2 puffs into the lungs in the morning and at bedtime.     cetirizine (ZYRTEC) 10 MG tablet Take 10 mg by mouth daily.     Cholecalciferol (VITAMIN D3) 5000 units CAPS Take 5,000 Units by mouth once a week.     citalopram (CELEXA) 20 MG tablet Take 20 mg by mouth daily.     Cyanocobalamin (VITAMIN B-12) 1000 MCG SUBL Take 1,000 mcg by mouth daily.     ezetimibe (ZETIA) 10 MG tablet Take 10 mg by mouth at bedtime.     fluticasone (FLONASE) 50 MCG/ACT nasal spray Place 1 spray into both nostrils daily as needed for allergies.     levalbuterol (XOPENEX) 1.25 MG/3ML nebulizer solution Take 1.25 mg by nebulization every 4 (four) hours as needed for wheezing or shortness of breath. 120 mL 11   losartan (COZAAR) 25 MG tablet Take 25 mg by mouth daily.     Multiple Vitamin (MULTIVITAMIN)  tablet Take 1 tablet by mouth daily.     OXYGEN Inhale 2 L into the lungs continuous.      phenazopyridine (PYRIDIUM) 95 MG tablet Take 95 mg by mouth 3 (three) times daily as needed for pain.     Probiotic Product (PRO-BIOTIC BLEND PO) Take 1 tablet by mouth daily. [employer] Probiotic Supplement     No current facility-administered medications for this encounter.    Physical Findings: The patient is in no acute distress. Patient is alert and oriented.  vitals were not taken for this visit. .   Lungs are clear to auscultation bilaterally. Heart has regular rate and rhythm. No palpable cervical, supraclavicular, or axillary adenopathy.  Abdomen soft, non-tender, normal bowel sounds. ***  Lab Findings: Lab Results  Component Value Date   WBC 8.5 01/02/2021   HGB 12.3 01/02/2021   HCT 39.5 01/02/2021   MCV 91.4 01/02/2021   PLT 145 (L) 01/02/2021    Radiographic Findings: CT CHEST WO CONTRAST  Result Date: 04/03/2023 CLINICAL DATA:  Non-small-cell lung cancer. Assess treatment response. Previous XRT. Occasional shortness of breath * Tracking Code: BO * EXAM: CT CHEST WITHOUT CONTRAST TECHNIQUE: Multidetector CT imaging of the chest was performed following the standard protocol without IV contrast. RADIATION DOSE REDUCTION: This exam was performed according to the departmental dose-optimization program which includes automated exposure control, adjustment of the mA and/or kV according to patient size and/or use of iterative reconstruction technique. COMPARISON:  09/24/22 and older. FINDINGS: Cardiovascular: Heart is nonenlarged. No pericardial effusion. Coronary artery calcifications are seen. The thoracic aorta has a normal course and caliber with scattered calcified plaque. Mediastinum/Nodes: Normal caliber thoracic esophagus. Small thyroid gland. No specific abnormal lymph node enlargement identified on this noncontrast examination in the axillary regions, hilum or mediastinum. Lungs/Pleura: Centrilobular emphysematous lung changes identified, most significant in the upper lung zones. No evidence of bilateral pleural effusion, pneumothorax or consolidation. Left lung has some patchy areas of ground-glass in the peripheral left upper lobe on series 8, image 60 which are new from previous and nonspecific. The larger of the 2 has a dimension approaching 15 mm. Overall similar to previous. There is some left-sided areas of bronchial wall thickening, nonspecific. The area of nodular ill-defined opacity in the posteroinferior right upper lung is again noted and appears relatively similar. On the prior study this has measured in the coronal  plane and at that time was measured at 13 x 6 mm in the coronal plane. Today on coronal series 6, image 86 there is more confluence nodular opacity measuring 2.4 by 0.8 cm. There is a associated retraction of lung tissue in the adjacent lateral pleura. The juxtapleural areas of nodularity more anteriorly in the right upper lung previous examination are also stable on image 44 and 48. Upper Abdomen: Right adrenal gland is preserved. Once again there is a left adrenal nodule measuring on series 2, image 177 22.7 cm in AP dimension, unchanged going back to remote study of May 14 2020. Previously this has been described as an adenoma. Multiple hepatic cysts again identified. Gallstones. Musculoskeletal: Scattered degenerative changes along the spine. IMPRESSION: Focal nodular area in the right upper lobe is slightly larger today. This has of uncertain etiology. Would recommend additional evaluation such as either close follow up CT in 3 months or a PET-CT scan. No new additional areas of nodularity or lymph node enlargement. Aortic Atherosclerosis (ICD10-I70.0) and Emphysema (ICD10-J43.9). Electronically Signed   By: Piedad Climes.D.  On: 04/03/2023 09:53    Impression: PET avid right upper lobe spiculated pulmonary nodule concerning for primary bronchogenic carcinoma    No evidence of recurrence on clinical exam today.  Recent chest CT scan favorable.  Discussed with the patient that we can return back to 24-month interval follow-up CT scans.  Plan: Routine follow-up in 6 months.  Prior to this follow-up appointment the patient will have a CT scan of the chest.   *** minutes of total time was spent for this patient encounter, including preparation, face-to-face counseling with the patient and coordination of care, physical exam, and documentation of the encounter. ____________________________________  Billie Lade, PhD, MD  This document serves as a record of services personally performed by  Antony Blackbird, MD. It was created on his behalf by Mickie Bail, a trained medical scribe. The creation of this record is based on the scribe's personal observations and the provider's statements to them. This document has been checked and approved by the attending provider.

## 2023-04-06 ENCOUNTER — Ambulatory Visit
Admission: RE | Admit: 2023-04-06 | Discharge: 2023-04-06 | Disposition: A | Discharge status: Home / Self Care | Payer: PPO | Source: Ambulatory Visit | Attending: Radiation Oncology | Admitting: Radiation Oncology

## 2023-04-06 VITALS — BP 137/76 | HR 101 | Temp 97.4°F | Resp 20 | Ht 64.0 in | Wt 171.4 lb

## 2023-04-06 DIAGNOSIS — I700 Atherosclerosis of aorta: Secondary | ICD-10-CM | POA: Insufficient documentation

## 2023-04-06 DIAGNOSIS — Z79899 Other long term (current) drug therapy: Secondary | ICD-10-CM | POA: Insufficient documentation

## 2023-04-06 DIAGNOSIS — J439 Emphysema, unspecified: Secondary | ICD-10-CM | POA: Diagnosis not present

## 2023-04-06 DIAGNOSIS — K8020 Calculus of gallbladder without cholecystitis without obstruction: Secondary | ICD-10-CM | POA: Insufficient documentation

## 2023-04-06 DIAGNOSIS — K7689 Other specified diseases of liver: Secondary | ICD-10-CM | POA: Insufficient documentation

## 2023-04-06 DIAGNOSIS — R911 Solitary pulmonary nodule: Secondary | ICD-10-CM | POA: Diagnosis not present

## 2023-04-06 DIAGNOSIS — R918 Other nonspecific abnormal finding of lung field: Secondary | ICD-10-CM | POA: Insufficient documentation

## 2023-04-06 NOTE — Progress Notes (Signed)
Brandy Morales is here today for follow up post radiation to the lung.  Lung Side: Right  Does the patient complain of any of the following: Pain: Denies pain Shortness of breath w/wo exertion: She reports that it is improving. Cough: She reports coughing intermittently. Hemoptysis: No Pain with swallowing: No Swallowing/choking concerns: No Appetite: Good Energy Level: Poor Post radiation skin Changes: She reports a place on her back.  BP 137/76 (BP Location: Right Arm, Patient Position: Sitting, Cuff Size: Normal)   Pulse (!) 101   Temp (!) 97.4 F (36.3 C)   Resp 20   Ht 5\' 4"  (1.626 m)   Wt 171 lb 6.4 oz (77.7 kg)   SpO2 100%   PF (!) 2 L/min   BMI 29.42 kg/m

## 2023-04-10 ENCOUNTER — Telehealth: Payer: Self-pay | Admitting: Pulmonary Disease

## 2023-04-10 NOTE — Telephone Encounter (Signed)
Pt calling to get 3 Samples to make her to the end of the year

## 2023-04-17 NOTE — Telephone Encounter (Signed)
Patient is calling for Breztri Samples.

## 2023-04-20 NOTE — Telephone Encounter (Signed)
Patient advised we can provide 2 Breztri samples. She said this would be enough. Patient aware they will be at the front desk.

## 2023-05-05 ENCOUNTER — Telehealth: Payer: Self-pay | Admitting: *Deleted

## 2023-05-05 NOTE — Telephone Encounter (Signed)
CALLED PATIENT TO INFORM OF CT FOR 07-07-23- ARRIVAL TIME- 10:45 AM @ [facility], NO RESTRICTIONS TO SCAN, PATIENT TO RECEIVE RESULTS FROM DR. KINARD ON 07/16/23 @ 11:45 AM, SPOKE WITH PATIENT AND SHE IS AWARE OF THESE APPTS, AND THE INSTRUCTIONS

## 2023-05-12 DIAGNOSIS — E7800 Pure hypercholesterolemia, unspecified: Secondary | ICD-10-CM | POA: Diagnosis not present

## 2023-05-12 DIAGNOSIS — R2230 Localized swelling, mass and lump, unspecified upper limb: Secondary | ICD-10-CM | POA: Diagnosis not present

## 2023-05-12 DIAGNOSIS — Z9981 Dependence on supplemental oxygen: Secondary | ICD-10-CM | POA: Diagnosis not present

## 2023-05-12 DIAGNOSIS — J9611 Chronic respiratory failure with hypoxia: Secondary | ICD-10-CM | POA: Diagnosis not present

## 2023-05-12 DIAGNOSIS — I10 Essential (primary) hypertension: Secondary | ICD-10-CM | POA: Diagnosis not present

## 2023-05-26 DIAGNOSIS — I10 Essential (primary) hypertension: Secondary | ICD-10-CM | POA: Diagnosis not present

## 2023-05-26 DIAGNOSIS — E7800 Pure hypercholesterolemia, unspecified: Secondary | ICD-10-CM | POA: Diagnosis not present

## 2023-07-07 ENCOUNTER — Encounter (HOSPITAL_COMMUNITY): Payer: Self-pay

## 2023-07-07 ENCOUNTER — Ambulatory Visit (HOSPITAL_COMMUNITY): Payer: PPO

## 2023-07-15 ENCOUNTER — Ambulatory Visit: Payer: PPO | Admitting: Acute Care

## 2023-07-16 ENCOUNTER — Ambulatory Visit: Payer: Self-pay | Admitting: Radiation Oncology

## 2023-07-20 ENCOUNTER — Ambulatory Visit (HOSPITAL_COMMUNITY)
Admission: RE | Admit: 2023-07-20 | Discharge: 2023-07-20 | Disposition: A | Discharge status: Home / Self Care | Payer: PPO | Source: Ambulatory Visit | Attending: Radiation Oncology | Admitting: Radiation Oncology

## 2023-07-20 ENCOUNTER — Encounter (HOSPITAL_COMMUNITY): Payer: Self-pay

## 2023-07-20 DIAGNOSIS — R918 Other nonspecific abnormal finding of lung field: Secondary | ICD-10-CM | POA: Insufficient documentation

## 2023-07-21 NOTE — Progress Notes (Signed)
 Radiation Oncology         [phone] ________________________________  Name: Brandy Morales MRN: [id number]  Date: 07/22/2023  DOB: 08/06/1945  Follow-Up Visit Note  CC: Jackelyn Poling, DO  Kalman Shan, MD  No diagnosis found.  Diagnosis: PET avid right upper lobe spiculated pulmonary nodule concerning for primary bronchogenic carcinoma     Interval Since Last Radiation: 3 years, 6 months, and 15 days    Radiation Treatment Dates: 12/29/2019 through 01/05/2020 Site Technique Total Dose (Gy) Dose per Fx (Gy) Completed Fx Beam Energies  Lung, Right: Lung_Rt SBRT 54/54 18 3/3 6XFFF    Narrative:  The patient returns today for routine follow-up and to review recent CT scan. She was last seen in office on 04-06-23. Since her last visit, she continued to follow up with specialists to manage her chronic conditions. She underwent a cut in her right lower extremity on 07-13-23 in which she had to get 4 sutures for. She was then treated with Telfa and bacitracin.   Most recent CT chest done on 07-20-23 showed (result still not available)  No other significant oncologic interval history since the patient was last seen.    Allergies:  is allergic to statins, latex, morphine and codeine, and oxycodone.  Meds: Current Outpatient Medications  Medication Sig Dispense Refill   albuterol (ACCUNEB) 1.25 MG/3ML nebulizer solution Take 1 ampule by nebulization every 6 (six) hours as needed for wheezing.     albuterol (VENTOLIN HFA) 108 (90 Base) MCG/ACT inhaler Inhale 2 puffs into the lungs every 6 (six) hours as needed for wheezing or shortness of breath.     Artificial Tear Solution (GENTEAL TEARS OP) Place 1 drop into both eyes daily.      atorvastatin (LIPITOR) 10 MG tablet Take 5 mg by mouth 3 (three) times a week. Take on MWF     azithromycin (ZITHROMAX) 1 g powder Take 1 g by mouth 3 (three) times a week. M, W, F     azithromycin (ZITHROMAX) 250 MG tablet TAKE 1 TABLET (250 MG TOTAL) BY  MOUTH EVERY MONDAY, WEDNESDAY, AND FRIDAY. 153 tablet 0   Budeson-Glycopyrrol-Formoterol (BREZTRI AEROSPHERE) 160-9-4.8 MCG/ACT AERO Inhale 2 puffs into the lungs 2 (two) times daily. 10.7 g 6   Budeson-Glycopyrrol-Formoterol (BREZTRI AEROSPHERE) 160-9-4.8 MCG/ACT AERO Inhale 2 puffs into the lungs in the morning and at bedtime.     cetirizine (ZYRTEC) 10 MG tablet Take 10 mg by mouth daily.     Cholecalciferol (VITAMIN D3) 5000 units CAPS Take 5,000 Units by mouth once a week.     citalopram (CELEXA) 20 MG tablet Take 20 mg by mouth daily.     Cyanocobalamin (VITAMIN B-12) 1000 MCG SUBL Take 1,000 mcg by mouth daily.     ezetimibe (ZETIA) 10 MG tablet Take 10 mg by mouth at bedtime.     fluticasone (FLONASE) 50 MCG/ACT nasal spray Place 1 spray into both nostrils daily as needed for allergies.     levalbuterol (XOPENEX) 1.25 MG/3ML nebulizer solution Take 1.25 mg by nebulization every 4 (four) hours as needed for wheezing or shortness of breath. 120 mL 11   losartan (COZAAR) 25 MG tablet Take 25 mg by mouth daily.     Multiple Vitamin (MULTIVITAMIN) tablet Take 1 tablet by mouth daily.     OXYGEN Inhale 2 L into the lungs continuous.      phenazopyridine (PYRIDIUM) 95 MG tablet Take 95 mg by mouth 3 (three) times daily as needed for pain.  Probiotic Product (PRO-BIOTIC BLEND PO) Take 1 tablet by mouth daily. [employer] Probiotic Supplement     No current facility-administered medications for this encounter.    Physical Findings: The patient is in no acute distress. Patient is alert and oriented.  vitals were not taken for this visit. .  No significant changes. Lungs are clear to auscultation bilaterally. Heart has regular rate and rhythm. No palpable cervical, supraclavicular, or axillary adenopathy. Abdomen soft, non-tender, normal bowel sounds.   Lab Findings: Lab Results  Component Value Date   WBC 8.5 01/02/2021   HGB 12.3 01/02/2021   HCT 39.5 01/02/2021   MCV 91.4  01/02/2021   PLT 145 (L) 01/02/2021    Radiographic Findings: No results found.  Impression:  PET avid right upper lobe spiculated pulmonary nodule concerning for primary bronchogenic carcinoma    The patient is recovering from the effects of radiation.  ***  Plan:  ***   *** minutes of total time was spent for this patient encounter, including preparation, face-to-face counseling with the patient and coordination of care, physical exam, and documentation of the encounter. ____________________________________  Billie Lade, PhD, MD  This document serves as a record of services personally performed by Antony Blackbird, MD. It was created on his behalf by Herbie Saxon, a trained medical scribe. The creation of this record is based on the scribe's personal observations and the provider's statements to them. This document has been checked and approved by the attending provider.

## 2023-07-22 ENCOUNTER — Encounter: Payer: Self-pay | Admitting: Radiation Oncology

## 2023-07-22 ENCOUNTER — Ambulatory Visit
Admission: RE | Admit: 2023-07-22 | Discharge: 2023-07-22 | Disposition: A | Discharge status: Home / Self Care | Payer: PPO | Source: Ambulatory Visit | Attending: Radiation Oncology | Admitting: Radiation Oncology

## 2023-07-22 VITALS — BP 148/83 | HR 107 | Temp 97.3°F | Resp 24 | Ht 64.0 in | Wt 172.0 lb

## 2023-07-22 DIAGNOSIS — J432 Centrilobular emphysema: Secondary | ICD-10-CM | POA: Insufficient documentation

## 2023-07-22 DIAGNOSIS — Z923 Personal history of irradiation: Secondary | ICD-10-CM | POA: Insufficient documentation

## 2023-07-22 DIAGNOSIS — K7689 Other specified diseases of liver: Secondary | ICD-10-CM | POA: Insufficient documentation

## 2023-07-22 DIAGNOSIS — R918 Other nonspecific abnormal finding of lung field: Secondary | ICD-10-CM | POA: Diagnosis not present

## 2023-07-22 DIAGNOSIS — D3502 Benign neoplasm of left adrenal gland: Secondary | ICD-10-CM | POA: Insufficient documentation

## 2023-07-22 DIAGNOSIS — I2510 Atherosclerotic heart disease of native coronary artery without angina pectoris: Secondary | ICD-10-CM | POA: Diagnosis not present

## 2023-07-22 DIAGNOSIS — I700 Atherosclerosis of aorta: Secondary | ICD-10-CM | POA: Insufficient documentation

## 2023-07-22 DIAGNOSIS — Z79899 Other long term (current) drug therapy: Secondary | ICD-10-CM | POA: Diagnosis not present

## 2023-07-22 NOTE — Progress Notes (Signed)
 Brandy Morales is here today for follow up post radiation to the lung.  Lung Side: right, patient completed treatment on 01/05/20  Does the patient complain of any of the following: Pain: No Shortness of breath w/wo exertion:  Yes, worsened shortness of breath. Patient continues to wear Oxygen @ 2 liters via nasal canula.  Cough: Yes-dry Hemoptysis: No Pain with swallowing: No Swallowing/choking concerns: No Appetite: Good  Energy Level: Low Post radiation skin Changes: No    Additional comments if applicable:  BP (!) 148/83 (BP Location: Left Arm, Patient Position: Sitting)   Pulse (!) 107   Temp (!) 97.3 F (36.3 C) (Temporal)   Resp (!) 24   Ht 5\' 4"  (1.626 m)   Wt 172 lb (78 kg)   SpO2 95%   BMI 29.52 kg/m

## 2023-07-23 ENCOUNTER — Ambulatory Visit: Payer: PPO | Admitting: Radiation Oncology

## 2023-07-31 ENCOUNTER — Encounter (HOSPITAL_COMMUNITY)
Admission: RE | Admit: 2023-07-31 | Discharge: 2023-07-31 | Disposition: A | Discharge status: Home / Self Care | Payer: PPO | Source: Ambulatory Visit | Attending: Radiology | Admitting: Radiology

## 2023-07-31 DIAGNOSIS — R918 Other nonspecific abnormal finding of lung field: Secondary | ICD-10-CM | POA: Diagnosis not present

## 2023-07-31 DIAGNOSIS — C3490 Malignant neoplasm of unspecified part of unspecified bronchus or lung: Secondary | ICD-10-CM | POA: Diagnosis not present

## 2023-07-31 LAB — GLUCOSE, CAPILLARY: Glucose-Capillary: 84 mg/dL (ref 70–99)

## 2023-07-31 MED ORDER — FLUDEOXYGLUCOSE F - 18 (FDG) INJECTION
8.5000 | Freq: Once | INTRAVENOUS | Status: AC | PRN
  Administered 2023-07-31: 8.66 via INTRAVENOUS

## 2023-08-05 DIAGNOSIS — Z961 Presence of intraocular lens: Secondary | ICD-10-CM | POA: Diagnosis not present

## 2023-08-11 NOTE — Progress Notes (Signed)
 Radiation Oncology         [phone] ________________________________  Name: Joanmarie Tsang MRN: [id number]  Date: 08/13/2023  DOB: 07/19/1945  Follow-Up Visit Note  CC: Jackelyn Poling, DO  Jackelyn Poling, DO  No diagnosis found.  Diagnosis:  PET avid right upper lobe spiculated pulmonary nodule concerning for primary bronchogenic carcinoma; s/p SBRT     Interval Since Last Radiation: 3 years, 7 months, and 7 days    Radiation Treatment Dates: 12/29/2019 through 01/05/2020 Site Technique Total Dose (Gy) Dose per Fx (Gy) Completed Fx Beam Energies  Lung, Right: Lung_Rt SBRT 54/54 18 3/3 6XFFF    Narrative:  The patient returns today for routine follow-up and to review recent PET scan. She was last seen in office on 07-22-23 for a routine follow up.                       Most recent PET scan done on 07-31-23 showed a new left upper lobe atypical pulmonary cyst with peripheral ground-glass measuring 2.0 x 2.2 cm. Lesion is hypometabolic but CT findings are worrisome for low-grade adenocarcinoma. Recommend attention on routine lung cancer imaging follow-up or in 6 months. Scan also indicated a left adrenal adenoma and chronic severe right hydronephrosis secondary to a 7 mm stone in the proximal right ureter along with a tiny left renal stones.  No other significant oncologic interval history since the patient was last seen.    Allergies:  is allergic to statins, latex, morphine and codeine, and oxycodone.  Meds: Current Outpatient Medications  Medication Sig Dispense Refill   albuterol (ACCUNEB) 1.25 MG/3ML nebulizer solution Take 1 ampule by nebulization every 6 (six) hours as needed for wheezing.     albuterol (VENTOLIN HFA) 108 (90 Base) MCG/ACT inhaler Inhale 2 puffs into the lungs every 6 (six) hours as needed for wheezing or shortness of breath.     Artificial Tear Solution (GENTEAL TEARS OP) Place 1 drop into both eyes daily.      atorvastatin (LIPITOR) 10 MG tablet Take 5 mg by  mouth 3 (three) times a week. Take on MWF     azithromycin (ZITHROMAX) 250 MG tablet TAKE 1 TABLET (250 MG TOTAL) BY MOUTH EVERY MONDAY, WEDNESDAY, AND FRIDAY. 153 tablet 0   Budeson-Glycopyrrol-Formoterol (BREZTRI AEROSPHERE) 160-9-4.8 MCG/ACT AERO Inhale 2 puffs into the lungs in the morning and at bedtime.     cetirizine (ZYRTEC) 10 MG tablet Take 10 mg by mouth daily.     Cholecalciferol (VITAMIN D3) 5000 units CAPS Take 5,000 Units by mouth once a week.     citalopram (CELEXA) 20 MG tablet Take 20 mg by mouth daily.     Cyanocobalamin (VITAMIN B-12) 1000 MCG SUBL Take 1,000 mcg by mouth daily.     ezetimibe (ZETIA) 10 MG tablet Take 10 mg by mouth at bedtime.     fluticasone (FLONASE) 50 MCG/ACT nasal spray Place 1 spray into both nostrils daily as needed for allergies.     levalbuterol (XOPENEX) 1.25 MG/3ML nebulizer solution Take 1.25 mg by nebulization every 4 (four) hours as needed for wheezing or shortness of breath. 120 mL 11   losartan (COZAAR) 25 MG tablet Take 25 mg by mouth daily.     Multiple Vitamin (MULTIVITAMIN) tablet Take 1 tablet by mouth daily.     OXYGEN Inhale 2 L into the lungs continuous.      Probiotic Product (PRO-BIOTIC BLEND PO) Take 1 tablet by mouth daily. [employer] Probiotic Supplement     No current facility-administered medications for this encounter.    Physical Findings: The patient is in no acute distress. Patient is alert and oriented.  vitals were not taken for this visit. .  No significant changes. Lungs are clear to auscultation bilaterally. Heart has regular rate and rhythm. No palpable cervical, supraclavicular, or axillary adenopathy. Abdomen soft, non-tender, normal bowel sounds.   Lab Findings: Lab Results  Component Value Date   WBC 8.5 01/02/2021   HGB 12.3 01/02/2021   HCT 39.5 01/02/2021   MCV 91.4 01/02/2021   PLT 145 (L) 01/02/2021    Radiographic Findings: NM PET Image Restag (PS) Skull Base To Thigh Result Date:  08/10/2023 CLINICAL DATA:  Subsequent treatment strategy for non-small cell lung cancer. EXAM: NUCLEAR MEDICINE PET SKULL BASE TO THIGH TECHNIQUE: 5.6 mCi F-18 FDG was injected intravenously. Full-ring PET imaging was performed from the skull base to thigh after the radiotracer. CT data was obtained and used for attenuation correction and anatomic localization. Fasting blood glucose: 84 mg/dl COMPARISON:  CT chest 13/12/6576, 04/01/2023 09/24/2022 and PET 10/12/2019. CT abdomen 10/13/2022. FINDINGS: Mediastinal blood pool activity: SUV max 2.8 Liver activity: SUV max NA NECK: No abnormal hypermetabolism. Incidental CT findings: None. CHEST: Post treatment scarring in the posterior segment right upper lobe, along the major fissure, without abnormal hypermetabolism, SUV max 2.1. Atypical pulmonary cyst with peripheral ground-glass in the lateral left upper lobe measures 2.0 x 2.2 cm (7/23), SUV max 1.8. Finding is unchanged from 04/01/2023 but new from 09/24/2022. No additional abnormal hypermetabolism. Incidental CT findings: Atherosclerotic calcification of the aorta, aortic valve and coronary arteries. Heart size normal. No pericardial effusion. No pleural effusion. Centrilobular emphysema with scattered pulmonary parenchymal scarring. ABDOMEN/PELVIS: No abnormal hypermetabolism. Incidental CT findings: Hepatic cysts. No specific follow-up necessary. 2.7 cm left adrenal nodule measures 9 Hounsfield units. No specific follow-up necessary. Severe right hydronephrosis secondary to a 7 mm stone in the proximal right ureter, similar. Tiny left renal stones. SKELETON: No abnormal hypermetabolism. Incidental CT findings: Degenerative changes in the spine. IMPRESSION: 1. New left upper lobe atypical pulmonary cyst with peripheral ground-glass. Lesion is hypometabolic but CT findings are worrisome for low-grade adenocarcinoma. Recommend attention on routine lung cancer imaging follow-up or in 6 months, as clinically  indicated. 2. Post treatment scarring in the posterior right upper lobe without abnormal hypermetabolism. 3. Left adrenal adenoma. 4. Chronic severe right hydronephrosis secondary to a 7 mm stone in the proximal right ureter. 5. Tiny left renal stones. 6. Aortic atherosclerosis (ICD10-I70.0). Coronary artery calcification. 7.  Emphysema (ICD10-J43.9). Electronically Signed   By: Leanna Battles M.D.   On: 08/10/2023 15:02   CT CHEST WO CONTRAST Result Date: 07/21/2023 CLINICAL DATA:  Non-small cell lung cancer, XRT complete, shortness of breath, cough EXAM: CT CHEST WITHOUT CONTRAST TECHNIQUE: Multidetector CT imaging of the chest was performed following the standard protocol without IV contrast. RADIATION DOSE REDUCTION: This exam was performed according to the departmental dose-optimization program which includes automated exposure control, adjustment of the mA and/or kV according to patient size and/or use of iterative reconstruction technique. COMPARISON:  04/01/2023 and 5104 FINDINGS: Cardiovascular: The heart is normal in size. No pericardial effusion. No evidence of thoracic aortic aneurysm. Atherosclerotic calcifications of the aortic arch. Mild three-vessel coronary atherosclerosis. Mediastinum/Nodes: No suspicious mediastinal lymphadenopathy. Visualized thyroid is unremarkable. Lungs/Pleura: Platelike spiculated opacity inferiorly in the right upper lobe measuring 2.1 x 1.5 cm (series 8/image 85) and 1.8 x 1.0 cm in  the anterior right lower lobe (series 8/image 68), both along the right major fissure (coronal image 100). This appearance is mildly progressive compared to the most recent prior and clearly progressive from May 2024. Mild cavitary/ground-glass nodule in the left upper lobe (series 8/image 62) with adjacent irregular nodular opacity (series 8/image 16). 4 mm irregular nodule in the right middle lobe along the minor fissure. Moderate centrilobular and paraseptal emphysematous changes, upper  lung predominant. No focal consolidation. No pleural effusion. Upper Abdomen: Visualized upper abdomen is notable for scattered hepatic cysts, calcified right upper pole renal lesion, and vascular calcifications. Additional 2.7 cm left adrenal adenoma. Musculoskeletal: Visualized osseous structures are within normal limits. IMPRESSION: Progressive platelike spiculated opacities in the right upper and lower lobes. Residual/recurrent tumor is not excluded. Consider PET-CT for further evaluation, as clinically warranted. Additional ancillary findings as above. Aortic Atherosclerosis (ICD10-I70.0) and Emphysema (ICD10-J43.9). Electronically Signed   By: Charline Bills M.D.   On: 07/21/2023 21:27    Impression:  PET avid right upper lobe spiculated pulmonary nodule concerning for primary bronchogenic carcinoma; s/p SBRT    The patient is recovering from the effects of radiation.  ***  Plan:  ***   *** minutes of total time was spent for this patient encounter, including preparation, face-to-face counseling with the patient and coordination of care, physical exam, and documentation of the encounter. ____________________________________  Billie Lade, PhD, MD  This document serves as a record of services personally performed by Antony Blackbird, MD. It was created on his behalf by Herbie Saxon, a trained medical scribe. The creation of this record is based on the scribe's personal observations and the provider's statements to them. This document has been checked and approved by the attending provider.

## 2023-08-13 ENCOUNTER — Ambulatory Visit
Admission: RE | Admit: 2023-08-13 | Discharge: 2023-08-13 | Disposition: A | Discharge status: Home / Self Care | Payer: PPO | Source: Ambulatory Visit | Attending: Radiation Oncology | Admitting: Radiation Oncology

## 2023-08-13 ENCOUNTER — Encounter: Payer: Self-pay | Admitting: Radiation Oncology

## 2023-08-13 VITALS — BP 109/78 | HR 113 | Temp 97.8°F | Resp 20 | Ht 64.0 in | Wt 169.8 lb

## 2023-08-13 DIAGNOSIS — I2510 Atherosclerotic heart disease of native coronary artery without angina pectoris: Secondary | ICD-10-CM | POA: Diagnosis not present

## 2023-08-13 DIAGNOSIS — R918 Other nonspecific abnormal finding of lung field: Secondary | ICD-10-CM | POA: Diagnosis not present

## 2023-08-13 DIAGNOSIS — N132 Hydronephrosis with renal and ureteral calculous obstruction: Secondary | ICD-10-CM | POA: Diagnosis not present

## 2023-08-13 DIAGNOSIS — Z79899 Other long term (current) drug therapy: Secondary | ICD-10-CM | POA: Insufficient documentation

## 2023-08-13 DIAGNOSIS — I700 Atherosclerosis of aorta: Secondary | ICD-10-CM | POA: Insufficient documentation

## 2023-08-13 DIAGNOSIS — Z923 Personal history of irradiation: Secondary | ICD-10-CM | POA: Diagnosis not present

## 2023-08-13 DIAGNOSIS — J432 Centrilobular emphysema: Secondary | ICD-10-CM | POA: Diagnosis not present

## 2023-08-13 DIAGNOSIS — D3502 Benign neoplasm of left adrenal gland: Secondary | ICD-10-CM | POA: Diagnosis not present

## 2023-08-13 DIAGNOSIS — R911 Solitary pulmonary nodule: Secondary | ICD-10-CM | POA: Diagnosis not present

## 2023-08-13 NOTE — Progress Notes (Signed)
 Brandy Morales is here today for follow up post radiation to the lung and to receive results from CT scan.  Lung Side: Right, patient completed treatment on 01/05/20  Does the patient complain of any of the following: Pain: Denies Shortness of breath w/wo exertion: Denies. Patient is on 2 liters. Cough: Yes, intermittently. Hemoptysis: Denies Pain with swallowing: Denies Swallowing/choking concerns: Denies Appetite: Great Energy Level: Low Post radiation skin Changes: Denies  BP 109/78 (BP Location: Left Arm, Patient Position: Sitting, Cuff Size: Normal)   Pulse (!) 113   Temp 97.8 F (36.6 C)   Resp 20   Ht 5\' 4"  (1.626 m)   Wt 169 lb 12.8 oz (77 kg)   SpO2 93%   PF (!) 2 L/min   BMI 29.15 kg/m

## 2023-08-18 ENCOUNTER — Encounter: Payer: Self-pay | Admitting: Acute Care

## 2023-08-18 ENCOUNTER — Ambulatory Visit: Payer: PPO | Admitting: Acute Care

## 2023-08-18 VITALS — BP 107/71 | HR 95 | Ht 64.0 in | Wt 173.0 lb

## 2023-08-18 DIAGNOSIS — Z923 Personal history of irradiation: Secondary | ICD-10-CM | POA: Diagnosis not present

## 2023-08-18 DIAGNOSIS — I4581 Long QT syndrome: Secondary | ICD-10-CM | POA: Diagnosis not present

## 2023-08-18 DIAGNOSIS — Z5181 Encounter for therapeutic drug level monitoring: Secondary | ICD-10-CM

## 2023-08-18 DIAGNOSIS — J449 Chronic obstructive pulmonary disease, unspecified: Secondary | ICD-10-CM

## 2023-08-18 DIAGNOSIS — J9611 Chronic respiratory failure with hypoxia: Secondary | ICD-10-CM

## 2023-08-18 DIAGNOSIS — Z87891 Personal history of nicotine dependence: Secondary | ICD-10-CM | POA: Diagnosis not present

## 2023-08-18 DIAGNOSIS — R911 Solitary pulmonary nodule: Secondary | ICD-10-CM

## 2023-08-18 LAB — EKG 12-LEAD

## 2023-08-18 NOTE — Progress Notes (Signed)
 History of Present Illness Brandy Morales is a 78 y.o. female referred in June 2021 for abnormal PET scan, Dr. Marchelle Gearing, PCP: By Hyacinth Meeker, [place], [place] . She was followed by Dr. Tonia Brooms for lung nodule and COPD/ Chronic hypoxic respiratory failure.  Synopsis 78 year old female past medical history of severe COPD, chronic oxygen dependent respiratory failure on 2 L pulse POC, asthma, hypertension. Patient had CT scan imaging which revealed a right upper lobe pulmonary nodule abutting the major fissure. Patient underwent nuclear medicine pet imaging which revealed a PET avid right upper lobe pulmonary nodule SUV 5.4. Patient was referred for evaluation of navigational bronchoscopy and tissue sampling. Patient felt to be not a good candidate for surgical resection due to poor lung function and functional status. She was set up for bronchoscopy ,however declined due to concern for general anesthesia and ultimately sought radiation oncology in August 2021 and completed 54 Gray, 3 fractions of empiric SBRT to this highly suspicious nodule. Radiation treatment was done 12/29/2019 through 01/05/2020 No tissue diagnosis was obtained.   Most recent surveillance CT chest done on 07-20-23 showed progressive platelike spiculated opacities in the right upper and lower lobes measuring  2.1 x 1.5 cm and 1.8 x 1.0 cm in the anterior right lower lobe, both along the right major fissure. Scan also showed a 4 mm irregular nodule in the right middle lobe along the minor fissure and mild cavitary/ground-glass nodule in the left upper lobe. Scattered hepatic cysts, calcified right upper pole renal lesion, and a 2.7 cm left adrenal adenoma was also noted on exam.  These areas were outside the original radiation field, so recurrence is possible.  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 Lesion is hypometabolic but CT findings are worrisome for low-grade  adenocarcinoma.Pt. is here for follow up.    08/18/2023 Pt. Presents for follow up. She states she has been doing well. She is 3.5 years out from her radiation treatment. 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 Plan is for a 6 month follow up scan in 6 months. Dr. Roselind Messier will order follow up scanning. He is following for any changes. She denies any hemoptysis of unintentional weight loss.   Pt. Is also followed by Dr. Tonia Brooms and Dr. Marchelle Gearing for COPD. She is on oxygen at 2 L [place]. She has had a stable interval since starting on Azithromycin  three times a week. No flares.  She states today she is having a good day today. She is here for a 6 month follow up.   She is compliant with her Breztri and Azithromycin 3 days a week ( MWF) to prevent COPD Flares. She has not had a flare since 01/2023 when Dr. Tonia Brooms started her on Azithromycin 3 days week. She states this has really helped her. She will need an EKG today to monitor  QTc as it has been a year since we have checked an EKG. Last QTc was 415 ms April 2024. Today's EKG shows QTc of 403 ms, Sinus rhythm with PAC's, which compared to previous EKG's. We will see her back in 6 months with an additional EKG to ensure QTc is WNL.  She states she uses her rescue inhaler based on the weather. Some days she does not need it at all, and others she does need it several times daily. She tells me today that Sunday of this last week she fell asleep without her oxyegn on, and she had  2 really bad days. She states today she feels she is back at her baseline.   She does not cough frequently, but when she starts she uses her albuterol  nebulizer and that takes care of it. She does wheeze occasionally, but is not wheezing today. Secretions are clear. She usually coughs up secretions after her Breztri. She denies any fever.    Test Results: EKG 08/18/23 Sinus Rhythm -occasional PAC   # PACs = 1. -Negative precordial T-waves. QTc 403 ms QT 356  ms  WITHIN NORMAL LIMITS   PET scan 07/31/2023 New left upper lobe atypical pulmonary cyst with peripheral ground-glass. Lesion is hypometabolic but CT findings are worrisome for low-grade adenocarcinoma. Recommend attention on routine lung cancer imaging follow-up or in 6 months, as clinically indicated. 2. Post treatment scarring in the posterior right upper lobe without abnormal hypermetabolism. 3. Left adrenal adenoma. 4. Chronic severe right hydronephrosis secondary to a 7 mm stone in the proximal right ureter. 5. Tiny left renal stones. 6. Aortic atherosclerosis (ICD10-I70.0). Coronary artery calcification. 7.  Emphysema (ICD10-J43.9).  CT Chest 07/19/2022 Progressive platelike spiculated opacities in the right upper and lower lobes. Residual/recurrent tumor is not excluded. Consider PET-CT for further evaluation, as clinically warranted.   Additional ancillary findings as above.   Aortic Atherosclerosis (ICD10-I70.0) and Emphysema (ICD10-J43.9).       Latest Ref Rng & Units 01/02/2021    5:03 AM 01/01/2021    4:25 AM 12/31/2020    4:15 AM  CBC  WBC 4.0 - 10.5 K/uL 8.5  7.8  5.6   Hemoglobin 12.0 - 15.0 g/dL 16.1  09.6  04.5   Hematocrit 36.0 - 46.0 % 39.5  37.3  38.1   Platelets 150 - 400 K/uL 145  121  111        Latest Ref Rng & Units 01/02/2021    5:03 AM 01/01/2021    4:25 AM 12/31/2020    4:15 AM  BMP  Glucose 70 - 99 mg/dL 409  96  811   BUN 8 - 23 mg/dL 36  46  54   Creatinine 0.44 - 1.00 mg/dL 9.14  7.82  9.56   Sodium 135 - 145 mmol/L 147  144  143   Potassium 3.5 - 5.1 mmol/L 4.3  3.4  3.9   Chloride 98 - 111 mmol/L 111  109  110   CO2 22 - 32 mmol/L 28  24  25    Calcium 8.9 - 10.3 mg/dL 9.3  9.0  9.0     BNP    Component Value Date/Time   BNP 185.2 (H) 01/02/2021 0503    ProBNP No results found for: "PROBNP"  PFT    Component Value Date/Time   FEV1PRE 0.63 05/14/2017 1049   FVCPRE 1.50 05/14/2017 1049   TLC 9.98 05/14/2017 1049   DLCOUNC  9.57 05/14/2017 1049   PREFEV1FVCRT 42 05/14/2017 1049    NM PET Image Restag (PS) Skull Base To Thigh Result Date: 08/10/2023 CLINICAL DATA:  Subsequent treatment strategy for non-small cell lung cancer. EXAM: NUCLEAR MEDICINE PET SKULL BASE TO THIGH TECHNIQUE: 5.6 mCi F-18 FDG was injected intravenously. Full-ring PET imaging was performed from the skull base to thigh after the radiotracer. CT data was obtained and used for attenuation correction and anatomic localization. Fasting blood glucose: 84 mg/dl COMPARISON:  CT chest 21/30/8657, 04/01/2023 09/24/2022 and PET 10/12/2019. CT abdomen 10/13/2022. FINDINGS: Mediastinal blood pool activity: SUV max 2.8 Liver activity: SUV max NA NECK: No abnormal hypermetabolism.  Incidental CT findings: None. CHEST: Post treatment scarring in the posterior segment right upper lobe, along the major fissure, without abnormal hypermetabolism, SUV max 2.1. Atypical pulmonary cyst with peripheral ground-glass in the lateral left upper lobe measures 2.0 x 2.2 cm (7/23), SUV max 1.8. Finding is unchanged from 04/01/2023 but new from 09/24/2022. No additional abnormal hypermetabolism. Incidental CT findings: Atherosclerotic calcification of the aorta, aortic valve and coronary arteries. Heart size normal. No pericardial effusion. No pleural effusion. Centrilobular emphysema with scattered pulmonary parenchymal scarring. ABDOMEN/PELVIS: No abnormal hypermetabolism. Incidental CT findings: Hepatic cysts. No specific follow-up necessary. 2.7 cm left adrenal nodule measures 9 Hounsfield units. No specific follow-up necessary. Severe right hydronephrosis secondary to a 7 mm stone in the proximal right ureter, similar. Tiny left renal stones. SKELETON: No abnormal hypermetabolism. Incidental CT findings: Degenerative changes in the spine. IMPRESSION: 1. New left upper lobe atypical pulmonary cyst with peripheral ground-glass. Lesion is hypometabolic but CT findings are worrisome for  low-grade adenocarcinoma. Recommend attention on routine lung cancer imaging follow-up or in 6 months, as clinically indicated. 2. Post treatment scarring in the posterior right upper lobe without abnormal hypermetabolism. 3. Left adrenal adenoma. 4. Chronic severe right hydronephrosis secondary to a 7 mm stone in the proximal right ureter. 5. Tiny left renal stones. 6. Aortic atherosclerosis (ICD10-I70.0). Coronary artery calcification. 7.  Emphysema (ICD10-J43.9). Electronically Signed   By: Leanna Battles M.D.   On: 08/10/2023 15:02   CT CHEST WO CONTRAST Result Date: 07/21/2023 CLINICAL DATA:  Non-small cell lung cancer, XRT complete, shortness of breath, cough EXAM: CT CHEST WITHOUT CONTRAST TECHNIQUE: Multidetector CT imaging of the chest was performed following the standard protocol without IV contrast. RADIATION DOSE REDUCTION: This exam was performed according to the departmental dose-optimization program which includes automated exposure control, adjustment of the mA and/or kV according to patient size and/or use of iterative reconstruction technique. COMPARISON:  04/01/2023 and 5104 FINDINGS: Cardiovascular: The heart is normal in size. No pericardial effusion. No evidence of thoracic aortic aneurysm. Atherosclerotic calcifications of the aortic arch. Mild three-vessel coronary atherosclerosis. Mediastinum/Nodes: No suspicious mediastinal lymphadenopathy. Visualized thyroid is unremarkable. Lungs/Pleura: Platelike spiculated opacity inferiorly in the right upper lobe measuring 2.1 x 1.5 cm (series 8/image 85) and 1.8 x 1.0 cm in the anterior right lower lobe (series 8/image 68), both along the right major fissure (coronal image 100). This appearance is mildly progressive compared to the most recent prior and clearly progressive from May 2024. Mild cavitary/ground-glass nodule in the left upper lobe (series 8/image 62) with adjacent irregular nodular opacity (series 8/image 16). 4 mm irregular nodule  in the right middle lobe along the minor fissure. Moderate centrilobular and paraseptal emphysematous changes, upper lung predominant. No focal consolidation. No pleural effusion. Upper Abdomen: Visualized upper abdomen is notable for scattered hepatic cysts, calcified right upper pole renal lesion, and vascular calcifications. Additional 2.7 cm left adrenal adenoma. Musculoskeletal: Visualized osseous structures are within normal limits. IMPRESSION: Progressive platelike spiculated opacities in the right upper and lower lobes. Residual/recurrent tumor is not excluded. Consider PET-CT for further evaluation, as clinically warranted. Additional ancillary findings as above. Aortic Atherosclerosis (ICD10-I70.0) and Emphysema (ICD10-J43.9). Electronically Signed   By: Charline Bills M.D.   On: 07/21/2023 21:27     Past medical hx Past Medical History:  Diagnosis Date   Anxiety    Asthma    COPD (chronic obstructive pulmonary disease) (HCC)    GERD (gastroesophageal reflux disease)    History of kidney stones  History of radiation therapy 01/05/2020   IMRT right lung 54 Gy in 3 Fractions 12/29/2019 through 01/05/2020  Dr Antony Blackbird   Hypertension    NSCL CA 10/2019   Right lung   On home O2    2 L [place] continuous     Social History   Tobacco Use   Smoking status: Former    Current packs/day: 0.00    Average packs/day: 2.0 packs/day for 55.0 years (110.0 ttl pk-yrs)    Types: Cigarettes    Start date: 73    Quit date: 2016    Years since quitting: 9.2    Passive exposure: Past   Smokeless tobacco: Never  Vaping Use   Vaping status: Never Used  Substance Use Topics   Alcohol use: Not Currently    Alcohol/week: 1.0 standard drink of alcohol    Types: 1 Glasses of wine per week    Comment: rarely   Drug use: Never    Ms.Lees reports that she quit smoking about 9 years ago. Her smoking use included cigarettes. She started smoking about 64 years ago. She has a 110 pack-year  smoking history. She has been exposed to tobacco smoke. She has never used smokeless tobacco. She reports that she does not currently use alcohol after a past usage of about 1.0 standard drink of alcohol per week. She reports that she does not use drugs.  Tobacco Cessation: Former smoker with a 110 pack year smoking history. Quit 2016   Past surgical hx, Family hx, Social hx all reviewed.  Current Outpatient Medications on File Prior to Visit  Medication Sig   acyclovir (ZOVIRAX) 400 MG tablet Take 400 mg by mouth daily at 2 PM.   albuterol (ACCUNEB) 1.25 MG/3ML nebulizer solution Take 1 ampule by nebulization every 6 (six) hours as needed for wheezing.   albuterol (VENTOLIN HFA) 108 (90 Base) MCG/ACT inhaler Inhale 2 puffs into the lungs every 6 (six) hours as needed for wheezing or shortness of breath.   Artificial Tear Solution (GENTEAL TEARS OP) Place 1 drop into both eyes daily.    atorvastatin (LIPITOR) 10 MG tablet Take 5 mg by mouth 3 (three) times a week. Take on MWF   azithromycin (ZITHROMAX) 250 MG tablet TAKE 1 TABLET (250 MG TOTAL) BY MOUTH EVERY MONDAY, WEDNESDAY, AND FRIDAY.   Budeson-Glycopyrrol-Formoterol (BREZTRI AEROSPHERE) 160-9-4.8 MCG/ACT AERO Inhale 2 puffs into the lungs in the morning and at bedtime.   cetirizine (ZYRTEC) 10 MG tablet Take 10 mg by mouth daily.   Cholecalciferol (VITAMIN D3) 5000 units CAPS Take 5,000 Units by mouth once a week.   citalopram (CELEXA) 20 MG tablet Take 20 mg by mouth daily.   Cyanocobalamin (VITAMIN B-12) 1000 MCG SUBL Take 1,000 mcg by mouth daily.   ezetimibe (ZETIA) 10 MG tablet Take 10 mg by mouth at bedtime.   fluticasone (FLONASE) 50 MCG/ACT nasal spray Place 1 spray into both nostrils daily as needed for allergies.   levalbuterol (XOPENEX) 1.25 MG/3ML nebulizer solution Take 1.25 mg by nebulization every 4 (four) hours as needed for wheezing or shortness of breath.   losartan (COZAAR) 25 MG tablet Take 25 mg by mouth daily.    Multiple Vitamin (MULTIVITAMIN) tablet Take 1 tablet by mouth daily.   OXYGEN Inhale 2 L into the lungs continuous.    Probiotic Product (PRO-BIOTIC BLEND PO) Take 1 tablet by mouth daily. [employer] Probiotic Supplement   No current facility-administered medications on file prior to visit.  Allergies  Allergen Reactions   Statins Cough   Latex Other (See Comments)    Broke out on face   Morphine And Codeine Other (See Comments)    Describes: "feel like I lose control of my body."   Oxycodone Other (See Comments)    Describes: "feels like I lose control of my body"    Review Of Systems:  Constitutional:   No  weight loss, night sweats,  Fevers, chills, fatigue, or  lassitude.  HEENT:   No headaches,  Difficulty swallowing,  Tooth/dental problems, or  Sore throat,                No sneezing, itching, ear ache, nasal congestion, post nasal drip,   CV:  No chest pain,  Orthopnea, PND, swelling in lower extremities, anasarca, dizziness, palpitations, syncope.   GI  No heartburn, indigestion, abdominal pain, nausea, vomiting, diarrhea, change in bowel habits, loss of appetite, bloody stools.   Resp: No shortness of breath with exertion or at rest.  No excess mucus, no productive cough,  No non-productive cough,  No coughing up of blood.  No change in color of mucus.  No wheezing.  No chest wall deformity  Skin: no rash or lesions.  GU: no dysuria, change in color of urine, no urgency or frequency.  No flank pain, no hematuria   MS:  No joint pain or swelling.  No decreased range of motion.  No back pain.  Psych:  No change in mood or affect. No depression or anxiety.  No memory loss.   Vital Signs BP 107/71 (BP Location: Left Arm, Patient Position: Sitting, Cuff Size: Normal)   Pulse 95   Ht 5\' 4"  (1.626 m)   Wt 173 lb (78.5 kg)   SpO2 95%   BMI 29.70 kg/m    Physical Exam:  General- No distress,  A&Ox3 ENT: No sinus tenderness, TM clear, pale nasal  mucosa, no oral exudate,no post nasal drip, no LAN Cardiac: S1, S2, regular rate and rhythm, no murmur Chest: No wheeze/ rales/ dullness; no accessory muscle use, no nasal flaring, no sternal retractions Abd.: Soft Non-tender Ext: No clubbing cyanosis, edema Neuro:  normal strength Skin: No rashes, warm and dry Psych: normal mood and behavior   Assessment/Plan Lung nodule, 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 PET scan shows low SUV Former smoker  Plan Please follow-up with Dr. Roselind Messier after the 46-month follow-up CT chest to continue monitoring the left upper lobe lesion. I am glad you are doing well since starting on the azithromycin 3 days a week. It is very encouraging that you have not had a flare since. We will do an EKG today to monitor your QTc as this is required when you are on maintenance azithromycin. Continue Breztri 2 puffs twice daily as you have been doing. Rinse mouth after use. Continue azithromycin 250 mg 1 tablet each Monday Wednesday and Friday, as you have been doing. Call for any early signs of a COPD flare. Note your daily symptoms > remember "red flags" for COPD:  Increase in cough, increase in sputum production, increase in shortness of breath or activity intolerance. If you notice these symptoms, please call to be seen.    The earlier we can get in to get you treated the left severe the flare. Continue oxygen at 2 L [place].  Saturations need to be > 90% at all times.  Follow-up with [place] in  6 months or as needed. She can see me or Ramaswamy, as he has seen her in the past for her COPD. >> she needs a 6 month follow up.  Dr. Delton Coombes or me to follow for the lung nodule  Continue use of saline nasal spray to help with dryness in your  nose. Please contact office for sooner follow up if symptoms do not improve or worsen or seek emergency care    COPD Plan EKG today to  check QTc on azithro.  Continue Breztri 2 puffs twice daily as you have been doing. Rinse mouth after use. Continue azithromycin 250 mg 1 tablet each Monday Wednesday and Friday, as you have been doing. Call for any early signs of a COPD flare. Note your daily symptoms > remember "red flags" for COPD:  Increase in cough, increase in sputum production, increase in shortness of breath or activity intolerance. If you notice these symptoms, please call to be seen.    The earlier we can get in to get you treated the left severe the flare. Continue oxygen at 2 L [place].  Saturations need to be > 90% at all times.  Albuterol nebs as needed for breakthrough shortness of breath of wheezing. Follow-up with [place] in 6 months or as needed. She can see me or Ramaswamy, as he has seen her in the past for her COPD. >> she needs a 6 month follow up.  Dr. Delton Coombes or me to follow for the lung nodule  Continue use of saline nasal spray to help with dryness in your  nose. Please contact office for sooner follow up if symptoms do not improve or worsen or seek emergency care   I spent 40 minutes dedicated to the care of this patient on the date of this encounter to include pre-visit review of records, face-to-face time with the patient discussing conditions above, post visit ordering of testing, clinical documentation with the electronic health record, making appropriate referrals as documented, and communicating necessary information to the patient's healthcare team.     Bevelyn Ngo, NP 08/18/2023  2:12 PM

## 2023-08-18 NOTE — Patient Instructions (Addendum)
 It is good to see you today. Please follow-up with Dr. Roselind Messier after the 65-month follow-up CT chest to continue monitoring the left upper lobe lesion. I am glad you are doing well since starting on the azithromycin 3 days a week. It is very encouraging that you have not had a flare since. We will do an EKG today to monitor your QTc as this is required when you are on maintenance azithromycin. Continue Breztri 2 puffs twice daily as you have been doing. Rinse mouth after use. Continue azithromycin 250 mg 1 tablet each Monday Wednesday and Friday, as you have been doing. Call for any early signs of a COPD flare. Note your daily symptoms > remember "red flags" for COPD:  Increase in cough, increase in sputum production, increase in shortness of breath or activity intolerance. If you notice these symptoms, please call to be seen.    The earlier we can get in to get you treated the left severe the flare. Continue oxygen at 2 L [place].  Saturations need to be > 90% at all times.  Follow-up with [place] in 6 months or as needed. She can see me or Ramaswamy, as he has seen her in the past for her COPD. >> she needs a 6 month follow up.  Dr. Delton Coombes or me to follow for the lung nodule  Continue use of saline nasal spray to help with dryness in your  nose. Please contact office for sooner follow up if symptoms do not improve or worsen or seek emergency care

## 2023-08-24 DIAGNOSIS — R8271 Bacteriuria: Secondary | ICD-10-CM | POA: Diagnosis not present

## 2023-08-24 DIAGNOSIS — N130 Hydronephrosis with ureteropelvic junction obstruction: Secondary | ICD-10-CM | POA: Diagnosis not present

## 2023-08-24 DIAGNOSIS — N201 Calculus of ureter: Secondary | ICD-10-CM | POA: Diagnosis not present

## 2023-09-17 ENCOUNTER — Telehealth: Payer: Self-pay

## 2023-09-17 NOTE — Telephone Encounter (Signed)
 Copied from CRM [id number]. Topic: Clinical - Prescription Issue >> Sep 16, 2023 10:56 AM Isabell A wrote: Reason for CRM: [employer] contact patient, states paperwork needs to be signed & sent back for her oxygen .  Fax no: [phone] [employer] re-authorization.  Please advise

## 2023-09-22 NOTE — Telephone Encounter (Signed)
 I called and spoke to pt. Pt states she was never given any paperwork for this and she would like this taken care of as soon as possible. I informed pt that I would call [employer] to see if they would fax over this paperwork. Pt verbalized understanding.   I called [employer] and spoke to Dolanda. Dolanda stated she would give me the direct line to speak to someone who could handle this and what paperwork was needed. [phone] )  I called this number and spoke to [facility]. She stated she pulled up the account and on 09-18-23, they received an order from Sarah Groce, NP for Oxygen  for home O2 and a POC. They already have the order and qualifying walk, and she is not sure what is needed as the order is still in pending authorization. She stated she checked the notes and nothing was stated for what is needed. The home concentrator and poc is pending.  Hanny gave me another [employer] number to speak to someone who could directly tell me what is needed. ( [phone] )  I called [employer] and spoke to [facility]. [place] stated the pt needs to restart and have equipment replaced, which happened from the orders on 09-18-23.  It was stated they are waiting on insurance to approve of the orders. A f/u will happen after a week to follow up on the request. A request will be sent to us  if the insurance needs anything from us .   I called and spoke to pt. I informed pt that that the O2 has to be processed through insurance first. The order was just placed so it does take time for orders to go through. I informed pt that it is best to give this some time for insurance to process this and once it goes through, [employer] will begin the process of the pt receiving the O2. I informed pt that if she wanted to jump ahead, she could call her insurance company and see what is needed. If not, [employer] would still contact our office to let us  know what is needed. Pt stated she would wait and see and verbalized understanding. NFN

## 2023-10-01 DIAGNOSIS — N201 Calculus of ureter: Secondary | ICD-10-CM | POA: Diagnosis not present

## 2023-10-01 DIAGNOSIS — R8271 Bacteriuria: Secondary | ICD-10-CM | POA: Diagnosis not present

## 2023-10-01 DIAGNOSIS — N3000 Acute cystitis without hematuria: Secondary | ICD-10-CM | POA: Diagnosis not present

## 2023-10-09 ENCOUNTER — Other Ambulatory Visit: Payer: Self-pay | Admitting: Acute Care

## 2023-10-09 MED ORDER — BREZTRI AEROSPHERE 160-9-4.8 MCG/ACT IN AERO: 2.0000 | INHALATION_SPRAY | Freq: Two times a day (BID) | RESPIRATORY_TRACT | Status: DC

## 2023-10-09 NOTE — Telephone Encounter (Signed)
 Copied from CRM [id number]. Topic: Clinical - Medication Refill >> Oct 09, 2023  8:46 AM Ilene Malling wrote: Most Recent Pulmonary Care Visit:   Provider: NP, Dara Ear  Department: [facility] Date: 08/18/23  Medication: Budeson-Glycopyrrol-Formoterol (BREZTRI  AEROSPHERE) 160-9-4.8 MCG/ACT AERO   Has the patient contacted their pharmacy? Yes (Agent: If no, request that the patient contact the pharmacy for the refill. If patient does not wish to contact the pharmacy document the reason why and proceed with request.) (Agent: If yes, when and what did the pharmacy advise?)  This is the patient's preferred pharmacy:  [id number] - [address] [place] [address] Phone: [phone] Fax: [phone]  Is this the correct pharmacy for this prescription? Yes If no, delete pharmacy and type the correct one.   Has the prescription been filled recently? No  Is the patient out of the medication? Patient states almost out and would like the refill today  Has the patient been seen for an appointment in the last year OR does the patient have an upcoming appointment? Yes  Can we respond through MyChart? Yes  Agent: Please be advised that Rx refills may take up to 3 business days. We ask that you follow-up with your pharmacy.

## 2023-10-12 ENCOUNTER — Encounter: Payer: Self-pay | Admitting: Primary Care

## 2023-10-12 ENCOUNTER — Other Ambulatory Visit: Payer: Self-pay | Admitting: Acute Care

## 2023-10-12 DIAGNOSIS — J449 Chronic obstructive pulmonary disease, unspecified: Secondary | ICD-10-CM | POA: Diagnosis not present

## 2023-10-14 ENCOUNTER — Telehealth: Payer: Self-pay | Admitting: *Deleted

## 2023-10-14 NOTE — Telephone Encounter (Signed)
 Copied from CRM [id number]. Topic: Clinical - Medication Refill >> Oct 09, 2023  8:46 AM Ilene Malling wrote: Most Recent Pulmonary Care Visit:   Provider: NP, Dara Ear  Department: [facility] Pulmonary Care Date: 08/18/23  Medication: Budeson-Glycopyrrol-Formoterol (BREZTRI  AEROSPHERE) 160-9-4.8 MCG/ACT AERO   Has the patient contacted their pharmacy? Yes (Agent: If no, request that the patient contact the pharmacy for the refill. If patient does not wish to contact the pharmacy document the reason why and proceed with request.) (Agent: If yes, when and what did the pharmacy advise?)  This is the patient's preferred pharmacy:  [id number] - [address] [place] [address] Phone: [phone] Fax: [phone]  Is this the correct pharmacy for this prescription? Yes If no, delete pharmacy and type the correct one.   Has the prescription been filled recently? No  Is the patient out of the medication? Patient states almost out and would like the refill today  Has the patient been seen for an appointment in the last year OR does the patient have an upcoming appointment? Yes  Can we respond through MyChart? Yes  Agent: Please be advised that Rx refills may take up to 3 business days. We ask that you follow-up with your pharmacy. >> Oct 13, 2023 12:01 PM CMA Tyrell Gallo wrote: Rx was sent to pharmacy on 10/12/2023 >> Oct 12, 2023 10:52 AM Margarette Shawl wrote: Pt is contacting clinic regarding a refill request for Breztri . She contacted clinic on 05/16, where a refill request was sent; however, does not show receipt by pharmacy and states "Sample". Pt ran out of medication this weekend and is worried about having symptoms.   Requests call back  # [phone] or # [phone]  ATC the pharmacy, however, they were closed for lunch.  I called and spoke with the patient, she states that she found a Breztri  that was within date and she did get a  text from her pharmacy that her prescription for Breztri  was ready and she will get it tomorrow.  Nothing further needed.

## 2023-10-21 DIAGNOSIS — Z0000 Encounter for general adult medical examination without abnormal findings: Secondary | ICD-10-CM | POA: Diagnosis not present

## 2023-10-21 DIAGNOSIS — J9611 Chronic respiratory failure with hypoxia: Secondary | ICD-10-CM | POA: Diagnosis not present

## 2023-10-21 DIAGNOSIS — M810 Age-related osteoporosis without current pathological fracture: Secondary | ICD-10-CM | POA: Diagnosis not present

## 2023-10-21 DIAGNOSIS — I10 Essential (primary) hypertension: Secondary | ICD-10-CM | POA: Diagnosis not present

## 2023-10-21 DIAGNOSIS — L819 Disorder of pigmentation, unspecified: Secondary | ICD-10-CM | POA: Diagnosis not present

## 2023-10-21 DIAGNOSIS — E7800 Pure hypercholesterolemia, unspecified: Secondary | ICD-10-CM | POA: Diagnosis not present

## 2023-10-23 DIAGNOSIS — I10 Essential (primary) hypertension: Secondary | ICD-10-CM | POA: Diagnosis not present

## 2023-10-23 DIAGNOSIS — J449 Chronic obstructive pulmonary disease, unspecified: Secondary | ICD-10-CM | POA: Diagnosis not present

## 2023-10-23 DIAGNOSIS — J441 Chronic obstructive pulmonary disease with (acute) exacerbation: Secondary | ICD-10-CM | POA: Diagnosis not present

## 2023-10-23 DIAGNOSIS — J439 Emphysema, unspecified: Secondary | ICD-10-CM | POA: Diagnosis not present

## 2023-11-12 DIAGNOSIS — J449 Chronic obstructive pulmonary disease, unspecified: Secondary | ICD-10-CM | POA: Diagnosis not present

## 2023-11-21 DIAGNOSIS — I10 Essential (primary) hypertension: Secondary | ICD-10-CM | POA: Diagnosis not present

## 2023-11-21 DIAGNOSIS — J449 Chronic obstructive pulmonary disease, unspecified: Secondary | ICD-10-CM | POA: Diagnosis not present

## 2023-11-21 DIAGNOSIS — J441 Chronic obstructive pulmonary disease with (acute) exacerbation: Secondary | ICD-10-CM | POA: Diagnosis not present

## 2023-11-21 DIAGNOSIS — J439 Emphysema, unspecified: Secondary | ICD-10-CM | POA: Diagnosis not present

## 2023-11-23 DIAGNOSIS — E7800 Pure hypercholesterolemia, unspecified: Secondary | ICD-10-CM | POA: Diagnosis not present

## 2023-11-23 DIAGNOSIS — I10 Essential (primary) hypertension: Secondary | ICD-10-CM | POA: Diagnosis not present

## 2023-11-23 DIAGNOSIS — M810 Age-related osteoporosis without current pathological fracture: Secondary | ICD-10-CM | POA: Diagnosis not present

## 2023-11-23 DIAGNOSIS — J439 Emphysema, unspecified: Secondary | ICD-10-CM | POA: Diagnosis not present

## 2023-11-23 DIAGNOSIS — J441 Chronic obstructive pulmonary disease with (acute) exacerbation: Secondary | ICD-10-CM | POA: Diagnosis not present

## 2023-11-23 DIAGNOSIS — J449 Chronic obstructive pulmonary disease, unspecified: Secondary | ICD-10-CM | POA: Diagnosis not present

## 2023-12-10 DIAGNOSIS — J439 Emphysema, unspecified: Secondary | ICD-10-CM | POA: Diagnosis not present

## 2023-12-10 DIAGNOSIS — H04123 Dry eye syndrome of bilateral lacrimal glands: Secondary | ICD-10-CM | POA: Diagnosis not present

## 2023-12-10 DIAGNOSIS — F3341 Major depressive disorder, recurrent, in partial remission: Secondary | ICD-10-CM | POA: Diagnosis not present

## 2023-12-10 DIAGNOSIS — J9610 Chronic respiratory failure, unspecified whether with hypoxia or hypercapnia: Secondary | ICD-10-CM | POA: Diagnosis not present

## 2023-12-10 DIAGNOSIS — F419 Anxiety disorder, unspecified: Secondary | ICD-10-CM | POA: Diagnosis not present

## 2023-12-10 DIAGNOSIS — I10 Essential (primary) hypertension: Secondary | ICD-10-CM | POA: Diagnosis not present

## 2023-12-10 DIAGNOSIS — J309 Allergic rhinitis, unspecified: Secondary | ICD-10-CM | POA: Diagnosis not present

## 2023-12-10 DIAGNOSIS — I2510 Atherosclerotic heart disease of native coronary artery without angina pectoris: Secondary | ICD-10-CM | POA: Diagnosis not present

## 2023-12-10 DIAGNOSIS — C3490 Malignant neoplasm of unspecified part of unspecified bronchus or lung: Secondary | ICD-10-CM | POA: Diagnosis not present

## 2023-12-10 DIAGNOSIS — B009 Herpesviral infection, unspecified: Secondary | ICD-10-CM | POA: Diagnosis not present

## 2023-12-10 DIAGNOSIS — E663 Overweight: Secondary | ICD-10-CM | POA: Diagnosis not present

## 2023-12-10 DIAGNOSIS — E785 Hyperlipidemia, unspecified: Secondary | ICD-10-CM | POA: Diagnosis not present

## 2023-12-12 DIAGNOSIS — J449 Chronic obstructive pulmonary disease, unspecified: Secondary | ICD-10-CM | POA: Diagnosis not present

## 2023-12-21 DIAGNOSIS — J441 Chronic obstructive pulmonary disease with (acute) exacerbation: Secondary | ICD-10-CM | POA: Diagnosis not present

## 2023-12-21 DIAGNOSIS — I10 Essential (primary) hypertension: Secondary | ICD-10-CM | POA: Diagnosis not present

## 2023-12-21 DIAGNOSIS — J439 Emphysema, unspecified: Secondary | ICD-10-CM | POA: Diagnosis not present

## 2023-12-21 DIAGNOSIS — J449 Chronic obstructive pulmonary disease, unspecified: Secondary | ICD-10-CM | POA: Diagnosis not present

## 2023-12-24 DIAGNOSIS — J439 Emphysema, unspecified: Secondary | ICD-10-CM | POA: Diagnosis not present

## 2023-12-24 DIAGNOSIS — J441 Chronic obstructive pulmonary disease with (acute) exacerbation: Secondary | ICD-10-CM | POA: Diagnosis not present

## 2023-12-24 DIAGNOSIS — E7800 Pure hypercholesterolemia, unspecified: Secondary | ICD-10-CM | POA: Diagnosis not present

## 2023-12-24 DIAGNOSIS — J449 Chronic obstructive pulmonary disease, unspecified: Secondary | ICD-10-CM | POA: Diagnosis not present

## 2023-12-24 DIAGNOSIS — M810 Age-related osteoporosis without current pathological fracture: Secondary | ICD-10-CM | POA: Diagnosis not present

## 2023-12-24 DIAGNOSIS — I10 Essential (primary) hypertension: Secondary | ICD-10-CM | POA: Diagnosis not present

## 2024-01-12 DIAGNOSIS — J449 Chronic obstructive pulmonary disease, unspecified: Secondary | ICD-10-CM | POA: Diagnosis not present

## 2024-01-20 DIAGNOSIS — J449 Chronic obstructive pulmonary disease, unspecified: Secondary | ICD-10-CM | POA: Diagnosis not present

## 2024-01-20 DIAGNOSIS — J441 Chronic obstructive pulmonary disease with (acute) exacerbation: Secondary | ICD-10-CM | POA: Diagnosis not present

## 2024-01-20 DIAGNOSIS — J439 Emphysema, unspecified: Secondary | ICD-10-CM | POA: Diagnosis not present

## 2024-01-20 DIAGNOSIS — I10 Essential (primary) hypertension: Secondary | ICD-10-CM | POA: Diagnosis not present

## 2024-01-24 DIAGNOSIS — M810 Age-related osteoporosis without current pathological fracture: Secondary | ICD-10-CM | POA: Diagnosis not present

## 2024-01-24 DIAGNOSIS — J449 Chronic obstructive pulmonary disease, unspecified: Secondary | ICD-10-CM | POA: Diagnosis not present

## 2024-01-24 DIAGNOSIS — I10 Essential (primary) hypertension: Secondary | ICD-10-CM | POA: Diagnosis not present

## 2024-01-24 DIAGNOSIS — J441 Chronic obstructive pulmonary disease with (acute) exacerbation: Secondary | ICD-10-CM | POA: Diagnosis not present

## 2024-01-24 DIAGNOSIS — J439 Emphysema, unspecified: Secondary | ICD-10-CM | POA: Diagnosis not present

## 2024-01-24 DIAGNOSIS — E7800 Pure hypercholesterolemia, unspecified: Secondary | ICD-10-CM | POA: Diagnosis not present

## 2024-02-12 DIAGNOSIS — J449 Chronic obstructive pulmonary disease, unspecified: Secondary | ICD-10-CM | POA: Diagnosis not present

## 2024-02-19 DIAGNOSIS — I10 Essential (primary) hypertension: Secondary | ICD-10-CM | POA: Diagnosis not present

## 2024-02-19 DIAGNOSIS — J441 Chronic obstructive pulmonary disease with (acute) exacerbation: Secondary | ICD-10-CM | POA: Diagnosis not present

## 2024-02-19 DIAGNOSIS — J439 Emphysema, unspecified: Secondary | ICD-10-CM | POA: Diagnosis not present

## 2024-02-19 DIAGNOSIS — J449 Chronic obstructive pulmonary disease, unspecified: Secondary | ICD-10-CM | POA: Diagnosis not present

## 2024-02-23 DIAGNOSIS — J441 Chronic obstructive pulmonary disease with (acute) exacerbation: Secondary | ICD-10-CM | POA: Diagnosis not present

## 2024-02-23 DIAGNOSIS — J439 Emphysema, unspecified: Secondary | ICD-10-CM | POA: Diagnosis not present

## 2024-02-23 DIAGNOSIS — J449 Chronic obstructive pulmonary disease, unspecified: Secondary | ICD-10-CM | POA: Diagnosis not present

## 2024-02-23 DIAGNOSIS — I10 Essential (primary) hypertension: Secondary | ICD-10-CM | POA: Diagnosis not present

## 2024-03-13 DIAGNOSIS — J449 Chronic obstructive pulmonary disease, unspecified: Secondary | ICD-10-CM | POA: Diagnosis not present

## 2024-03-20 DIAGNOSIS — J441 Chronic obstructive pulmonary disease with (acute) exacerbation: Secondary | ICD-10-CM | POA: Diagnosis not present

## 2024-03-20 DIAGNOSIS — I10 Essential (primary) hypertension: Secondary | ICD-10-CM | POA: Diagnosis not present

## 2024-03-20 DIAGNOSIS — J439 Emphysema, unspecified: Secondary | ICD-10-CM | POA: Diagnosis not present

## 2024-03-20 DIAGNOSIS — J449 Chronic obstructive pulmonary disease, unspecified: Secondary | ICD-10-CM | POA: Diagnosis not present

## 2024-03-25 DIAGNOSIS — M810 Age-related osteoporosis without current pathological fracture: Secondary | ICD-10-CM | POA: Diagnosis not present

## 2024-03-25 DIAGNOSIS — I10 Essential (primary) hypertension: Secondary | ICD-10-CM | POA: Diagnosis not present

## 2024-03-25 DIAGNOSIS — J449 Chronic obstructive pulmonary disease, unspecified: Secondary | ICD-10-CM | POA: Diagnosis not present

## 2024-03-25 DIAGNOSIS — J439 Emphysema, unspecified: Secondary | ICD-10-CM | POA: Diagnosis not present

## 2024-03-25 DIAGNOSIS — J441 Chronic obstructive pulmonary disease with (acute) exacerbation: Secondary | ICD-10-CM | POA: Diagnosis not present

## 2024-03-25 DIAGNOSIS — E7800 Pure hypercholesterolemia, unspecified: Secondary | ICD-10-CM | POA: Diagnosis not present

## 2024-04-05 ENCOUNTER — Telehealth: Payer: Self-pay

## 2024-04-05 NOTE — Telephone Encounter (Signed)
 Copied from CRM [id number]. Topic: Clinical - Medical Advice >> Apr 04, 2024  3:31 PM Leila BROCKS wrote: Reason for CRM: Patient [phone] states NP, Groce referred patient to see Dr. Shelah and Dr. Geronimo, but patient states she does not have any issues with them but does not want to see them. Patient wants to see another pulmonologist like Dr. Brenna. Patient wants to continue seeing NP, Groce but she was advised to see a physician. Patient wants NP, Groce consult, please advise and call back.      Spoke with patient read back what was stated in AVS schedule her w/ [place] NP

## 2024-04-10 ENCOUNTER — Other Ambulatory Visit: Payer: Self-pay | Admitting: Acute Care

## 2024-04-13 DIAGNOSIS — J449 Chronic obstructive pulmonary disease, unspecified: Secondary | ICD-10-CM | POA: Diagnosis not present

## 2024-04-19 ENCOUNTER — Ambulatory Visit: Admitting: Acute Care

## 2024-04-19 ENCOUNTER — Encounter: Payer: Self-pay | Admitting: Acute Care

## 2024-04-19 VITALS — BP 100/70 | HR 94 | Temp 97.7°F | Ht 64.0 in | Wt 161.2 lb

## 2024-04-19 DIAGNOSIS — R9389 Abnormal findings on diagnostic imaging of other specified body structures: Secondary | ICD-10-CM | POA: Diagnosis not present

## 2024-04-19 DIAGNOSIS — Z87891 Personal history of nicotine dependence: Secondary | ICD-10-CM | POA: Diagnosis not present

## 2024-04-19 DIAGNOSIS — R911 Solitary pulmonary nodule: Secondary | ICD-10-CM | POA: Diagnosis not present

## 2024-04-19 DIAGNOSIS — I10 Essential (primary) hypertension: Secondary | ICD-10-CM | POA: Diagnosis not present

## 2024-04-19 DIAGNOSIS — Z5181 Encounter for therapeutic drug level monitoring: Secondary | ICD-10-CM

## 2024-04-19 DIAGNOSIS — J449 Chronic obstructive pulmonary disease, unspecified: Secondary | ICD-10-CM

## 2024-04-19 DIAGNOSIS — R942 Abnormal results of pulmonary function studies: Secondary | ICD-10-CM

## 2024-04-19 DIAGNOSIS — J9611 Chronic respiratory failure with hypoxia: Secondary | ICD-10-CM

## 2024-04-19 DIAGNOSIS — J441 Chronic obstructive pulmonary disease with (acute) exacerbation: Secondary | ICD-10-CM | POA: Diagnosis not present

## 2024-04-19 DIAGNOSIS — J439 Emphysema, unspecified: Secondary | ICD-10-CM | POA: Diagnosis not present

## 2024-04-19 NOTE — Progress Notes (Signed)
 History of Present Illness Brandy Morales is a 78 y.o. female referred in June 2021 for abnormal PET scan, Dr. Geronimo, PCP: By Cleotilde, [place]  E, PA . She was followed by Dr. Brenna for lung nodule and COPD/ Chronic hypoxic respiratory failure.    Synopsis 78 year old female past medical history of severe COPD, chronic oxygen  dependent respiratory failure on 2 L pulse POC, asthma, hypertension. Patient had CT scan imaging which revealed a right upper lobe pulmonary nodule abutting the major fissure. Patient underwent nuclear medicine pet imaging which revealed a PET avid right upper lobe pulmonary nodule SUV 5.4. Patient was referred for evaluation of navigational bronchoscopy and tissue sampling. Patient felt to be not a good candidate for surgical resection due to poor lung function and functional status. She was set up for bronchoscopy ,however declined due to concern for general anesthesia and ultimately sought radiation oncology in August 2021 and completed 54 Gray, 3 fractions of empiric SBRT to this highly suspicious nodule. Radiation treatment was done 12/29/2019 through 01/05/2020 No tissue diagnosis was obtained.    Most recent surveillance CT chest done on 07-20-23 showed progressive platelike spiculated opacities in the right upper and lower lobes measuring  2.1 x 1.5 cm and 1.8 x 1.0 cm in the anterior right lower lobe, both along the right major fissure. Scan also showed a 4 mm irregular nodule in the right middle lobe along the minor fissure and mild cavitary/ground-glass nodule in the left upper lobe. Scattered hepatic cysts, calcified right upper pole renal lesion, and a 2.7 cm left adrenal adenoma was also noted on exam.  These areas were outside the original radiation field, so recurrence is possible.  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 Lesion is hypometabolic but CT findings are worrisome for low-grade  adenocarcinoma.   04/19/2024 Discussed the use of AI scribe software for clinical note transcription with the patient, who gave verbal consent to proceed.  History of Present Illness Pt.presents for follow up of her right upper lobe nodule , previously treated with SBRT, and a new atypical pulmonary cyst with peripheral ground-glass in the lateral left upper lobe measuring  2.0 x 2.2 cm. This was noted on a PET CT Chest  07/2023. Plan was for a 6  month follow up can. She is overdue for her 6 month follow up Ct Chest,due 01/2024,  and for her COPD follow up.   She states she has had worsening shortness of breath with minimal exertion over the last month or so. She is wearing her oxygen  at 2 L at rest and 3 L with exertion, but feels she is more short of breath despite the additional oxygen . She uses her POC at 3 L.  She states she is using albuterol  2-3 times a day for breakthrough dyspnea. She is compliant with her Breztri  twice daily. Secretions are white. She denies fever,or chills. She remains active, meeting friends for lunch weekly..   We have walked her today on 3 L [place] and she dropped her sats to as low as 89%, HR was 121, and she was visibly working hard to breath. .She had positive nasal flaring, and was using accessory muscles. This resolved quickly at rest.  We will increase oxygen  at rest to 3 L, and to 4 L with ambulation, as her oxygen  needs have increased. We will do a short term follow up after CT Chest to see if her breathing is any better.  I am concerned that she has had a 12 pound weight loss that she feels is related to not eating after 3 pm as she states she is not hungry . She has had a decrease in her appetite. We will get repeat CT Chest, and I will make sure she has follow up with radiation oncology. She has not seen them since 07/2023, and was due for a 6 month follow up CT Chest at that time for them also.     Test Results: PET scan 07/31/2023 New left upper lobe atypical  pulmonary cyst with peripheral ground-glass. Lesion is hypermetabolic, with SUV max of 2.1, but CT findings are worrisome for low-grade adenocarcinoma. Recommend attention on routine lung cancer imaging follow-up or in 6 months, as clinically indicated. 2. Post treatment scarring in the posterior right upper lobe without abnormal hypermetabolism. 3. Left adrenal adenoma. 4. Chronic severe right hydronephrosis secondary to a 7 mm stone in the proximal right ureter. 5. Tiny left renal stones. 6. Aortic atherosclerosis (ICD10-I70.0). Coronary artery calcification. 7.  Emphysema (ICD10-J43.9).   CT Chest 07/19/2022 Progressive platelike spiculated opacities in the right upper and lower lobes. Residual/recurrent tumor is not excluded. Consider PET-CT for further evaluation, as clinically warranted.   Additional ancillary findings as above.   Aortic Atherosclerosis (ICD10-I70.0) and Emphysema (ICD10-J43.9).      Latest Ref Rng & Units 01/02/2021    5:03 AM 01/01/2021    4:25 AM 12/31/2020    4:15 AM  CBC  WBC 4.0 - 10.5 K/uL 8.5  7.8  5.6   Hemoglobin 12.0 - 15.0 g/dL 87.6  88.2  87.8   Hematocrit 36.0 - 46.0 % 39.5  37.3  38.1   Platelets 150 - 400 K/uL 145  121  111        Latest Ref Rng & Units 01/02/2021    5:03 AM 01/01/2021    4:25 AM 12/31/2020    4:15 AM  BMP  Glucose 70 - 99 mg/dL 896  96  859   BUN 8 - 23 mg/dL 36  46  54   Creatinine 0.44 - 1.00 mg/dL 8.92  8.82  8.46   Sodium 135 - 145 mmol/L 147  144  143   Potassium 3.5 - 5.1 mmol/L 4.3  3.4  3.9   Chloride 98 - 111 mmol/L 111  109  110   CO2 22 - 32 mmol/L 28  24  25    Calcium  8.9 - 10.3 mg/dL 9.3  9.0  9.0     BNP    Component Value Date/Time   BNP 185.2 (H) 01/02/2021 0503    ProBNP No results found for: PROBNP  PFT    Component Value Date/Time   FEV1PRE 0.63 05/14/2017 1049   FVCPRE 1.50 05/14/2017 1049   TLC 9.98 05/14/2017 1049   DLCOUNC 9.57 05/14/2017 1049   PREFEV1FVCRT 42 05/14/2017 1049     No results found.   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   On home O2    2 L [place] continuous     Social History  Tobacco Use   Smoking status: Former    Current packs/day: 0.00    Average packs/day: 2.0 packs/day for 55.0 years (110.0 ttl pk-yrs)    Types: Cigarettes    Start date: 45    Quit date: 2016    Years since quitting: 9.9    Passive exposure: Past   Smokeless tobacco: Never  Vaping Use   Vaping status: Never Used  Substance Use Topics   Alcohol  use: Not Currently    Alcohol /week: 1.0 standard drink of alcohol     Types: 1 Glasses of wine per week    Comment: rarely   Drug use: Never    Ms.Bassett reports that she quit smoking about 9 years ago. Her smoking use included cigarettes. She started smoking about 64 years ago. She has a 110 pack-year smoking history. She has been exposed to tobacco smoke. She has never used smokeless tobacco. She reports that she does not currently use alcohol  after a past usage of about 1.0 standard drink of alcohol  per week. She reports that she does not use drugs.  Tobacco Cessation: Counseling given: Not Answered Former smoker with a 110 pack year smoking history, quit 2016  Past surgical hx, Family hx, Social hx all reviewed.  Current Outpatient Medications on File Prior to Visit  Medication Sig   acyclovir  (ZOVIRAX ) 400 MG tablet Take 400 mg by mouth daily at 2 PM.   albuterol  (ACCUNEB ) 1.25 MG/3ML nebulizer solution Take 1 ampule by nebulization every 6 (six) hours as needed for wheezing.   albuterol  (VENTOLIN  HFA) 108 (90 Base) MCG/ACT inhaler Inhale 2 puffs into the lungs every 6  (six) hours as needed for wheezing or shortness of breath.   Artificial Tear Solution (GENTEAL TEARS OP) Place 1 drop into both eyes daily.    atorvastatin  (LIPITOR) 10 MG tablet Take 5 mg by mouth 3 (three) times a week. Take on MWF   BREZTRI  AEROSPHERE 160-9-4.8 MCG/ACT AERO inhaler INHALE 2 PUFFS INTO THE LUNGS TWICE A DAY   cetirizine (ZYRTEC) 10 MG tablet Take 10 mg by mouth daily.   Cholecalciferol (VITAMIN D3) 5000 units CAPS Take 5,000 Units by mouth once a week.   citalopram  (CELEXA ) 20 MG tablet Take 20 mg by mouth daily.   Cyanocobalamin (VITAMIN B-12) 1000 MCG SUBL Take 1,000 mcg by mouth daily.   ezetimibe  (ZETIA ) 10 MG tablet Take 10 mg by mouth at bedtime.   fluticasone  (FLONASE ) 50 MCG/ACT nasal spray Place 1 spray into both nostrils daily as needed for allergies.   losartan  (COZAAR ) 25 MG tablet Take 25 mg by mouth daily.   Multiple Vitamin (MULTIVITAMIN) tablet Take 1 tablet by mouth daily.   OXYGEN  Inhale 2 L into the lungs continuous.    Probiotic Product (PRO-BIOTIC BLEND PO) Take 1 tablet by mouth daily. [employer] Probiotic Supplement   levalbuterol  (XOPENEX ) 1.25 MG/3ML nebulizer solution Take 1.25 mg by nebulization every 4 (four) hours as needed for wheezing or shortness of breath. (Patient not taking: Reported on 04/19/2024)   No current facility-administered medications on file prior to visit.     Allergies  Allergen Reactions   Statins Cough   Latex Other (See Comments)    Broke out on face   Morphine  And Codeine Other (See Comments)    Describes: feel like I lose control of my body.   Oxycodone  Other (See Comments)    Describes: feels like I lose control of my body    Review Of Systems:  Constitutional:   +  weight loss, No night sweats,  Fevers, chills, + fatigue, or  lassitude.  HEENT:   No headaches,  Difficulty swallowing,  Tooth/dental problems, or  Sore throat,                No sneezing, itching, ear ache, nasal congestion, post  nasal drip,   CV:  No chest pain,  Orthopnea, PND, swelling in lower extremities, anasarca, dizziness, palpitations, syncope.   GI  No heartburn, indigestion, abdominal pain, nausea, vomiting, diarrhea, change in bowel habits, loss of appetite, bloody stools.   Resp: + shortness of breath with exertion less at rest.  No excess mucus, no productive cough,  No non-productive cough,  No coughing up of blood.  No change in color of mucus.  N+ occasional wheezing.  No chest wall deformity  Skin: no rash or lesions.  GU: no dysuria, change in color of urine, no urgency or frequency.  No flank pain, no hematuria   MS:  No joint pain or swelling.  + decreased range of motion.  No back pain.  Psych:  No change in mood or affect. No depression or anxiety.  No memory loss.   Vital Signs BP 100/70 (BP Location: Right Arm, Patient Position: Sitting, Cuff Size: Normal)   Pulse 94   Temp 97.7 F (36.5 C) (Oral)   Ht 5' 4 (1.626 m)   Wt 161 lb 3.2 oz (73.1 kg)   SpO2 (!) 89% Comment: 3L ambulating  BMI 27.67 kg/m    Physical Exam:  General- No distress,  A&Ox3, pleasant and appropriate ENT: No sinus tenderness, TM clear, pale nasal mucosa, no oral exudate,no post nasal drip, no LAN Cardiac: S1, S2, regular rate and rhythm, no murmur Chest: + wheeze/ No rales/ dullness; + accessory muscle use with exertion , no nasal flaring, no sternal retractions Abd.: Soft Non-tender, ND, BS +, Body mass index is 27.67 kg/m.  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 PET scan shows low SUV Severe COPD, emphysematous type, on azithromycin   Former  smoker chronic Hypoxic Respiratory Failure >> Home oxygen  with worsening dyspnea We will do a CT Chest asap to re-evaluate the pulmonary nodule of concern we have been monitoring. We will walk you today on 3 L to see if we need to increase your oxygen . We will do an EKG today to monitor your QTc as this is required when you are on maintenance azithromycin .>> EKG was fine Continue Breztri  2 puffs twice daily as you have been doing. Rinse mouth after use. Continue azithromycin  250 mg 1 tablet each Monday Wednesday and Friday, as you have been doing. Call for any early signs of a COPD flare. Note your daily symptoms > remember red flags for COPD:  Increase in cough, increase in sputum production, increase in shortness of breath or activity intolerance. If you notice these symptoms, please call to be seen.    The earlier we can get in to get you treated the left severe the flare. Continue oxygen  at 3 L [place] at rest and 4 L with ambulation. This is an increase based on results from your walk. Use albuterol  as needed for shortness of breath  or wheezing.   Saturations need to be > 90% at all times.  Follow-up with us  in 3 weeks  or before as needed.( Review CT chest , and to see if increase in oxygen  has improved shortness of breath)  Follow up with Radiation oncology , as you have not seen them since 06/2023. They will also need to review the CT I have ordered. Have a good holiday. Please contact office for sooner follow up if symptoms do not improve or worsen or seek emergency care     I spent 40 minutes dedicated to the care of this patient on the date of this encounter to include pre-visit review of records, face-to-face time with the patient discussing conditions above, post visit ordering of testing, clinical documentation with the electronic health record, making appropriate referrals as documented, and communicating necessary information to the patient's healthcare team.     Lauraine JULIANNA Lites,  NP 04/19/2024  10:35 PM   04/20/2024 I called the patient to make sure she had enough Azithromycin  over the holiday. She does not have any additional refills. She states she has a full bottle. I asked her to call for a refill when she is halfway through the last bottle. She verbalized understanding.

## 2024-04-19 NOTE — Patient Instructions (Addendum)
 It is good to see you today. We will walk you today on 3 L to see if we need to increase your oxygen . We will do an EKG today to check you while you are on your maintenance azithromycin . EKG was fine, no changes. Continue Breztri  2 puffs twice daily as you have been doing. Rinse mouth after use. Continue azithromycin  250 mg 1 tablet each Monday, Wednesday, and Friday, as you have been doing. Call for any early signs of a COPD flare. Note your daily symptoms > remember red flags for COPD:  Increase in cough, increase in sputum production, increase in shortness of breath or activity intolerance. If you notice these symptoms, please call to be seen.    The earlier we can get in to get you treated the less severe the flare. Continue oxygen  at 3 L [place] at rest and 4 L with ambulation. This is an increase based on results from your walk. Use albuterol  as needed for shortness of breath or wheezing.   Saturations need to be > 90% at all times.  Follow-up with us  in 3 weeks  or before as needed.( Review CT chest )  Follow up with Radiation oncology , as you have not seen them since 06/2023. Have a good holiday. Please contact office for sooner follow up if symptoms do not improve or worsen or seek emergency care

## 2024-04-20 ENCOUNTER — Other Ambulatory Visit: Payer: Self-pay | Admitting: Acute Care

## 2024-04-20 ENCOUNTER — Telehealth: Payer: Self-pay | Admitting: Acute Care

## 2024-04-20 NOTE — Telephone Encounter (Signed)
 Please see what you can do about getting this scheduled at [facility], which is where I ordered it. Radiology  scheduled the patient for 04/26/2024 at 7:30 am and she does not want to drive on [address]. She also has a conflicting appointment that day. Can you get this rescheduled between 11 am and 2 pm on another day? The only day she cannot do is 12/12.

## 2024-04-20 NOTE — Telephone Encounter (Signed)
 Patient has been rescheduled 12/11 at 1:30pm

## 2024-04-24 DIAGNOSIS — I10 Essential (primary) hypertension: Secondary | ICD-10-CM | POA: Diagnosis not present

## 2024-04-24 DIAGNOSIS — M810 Age-related osteoporosis without current pathological fracture: Secondary | ICD-10-CM | POA: Diagnosis not present

## 2024-04-24 DIAGNOSIS — J439 Emphysema, unspecified: Secondary | ICD-10-CM | POA: Diagnosis not present

## 2024-04-24 DIAGNOSIS — E7800 Pure hypercholesterolemia, unspecified: Secondary | ICD-10-CM | POA: Diagnosis not present

## 2024-04-24 DIAGNOSIS — J441 Chronic obstructive pulmonary disease with (acute) exacerbation: Secondary | ICD-10-CM | POA: Diagnosis not present

## 2024-04-24 DIAGNOSIS — J449 Chronic obstructive pulmonary disease, unspecified: Secondary | ICD-10-CM | POA: Diagnosis not present

## 2024-04-26 ENCOUNTER — Other Ambulatory Visit

## 2024-04-26 DIAGNOSIS — Z6827 Body mass index (BMI) 27.0-27.9, adult: Secondary | ICD-10-CM | POA: Diagnosis not present

## 2024-04-26 DIAGNOSIS — F32A Depression, unspecified: Secondary | ICD-10-CM | POA: Diagnosis not present

## 2024-04-26 DIAGNOSIS — J9611 Chronic respiratory failure with hypoxia: Secondary | ICD-10-CM | POA: Diagnosis not present

## 2024-04-26 DIAGNOSIS — I10 Essential (primary) hypertension: Secondary | ICD-10-CM | POA: Diagnosis not present

## 2024-04-26 DIAGNOSIS — E7800 Pure hypercholesterolemia, unspecified: Secondary | ICD-10-CM | POA: Diagnosis not present

## 2024-04-26 DIAGNOSIS — J449 Chronic obstructive pulmonary disease, unspecified: Secondary | ICD-10-CM | POA: Diagnosis not present

## 2024-04-26 DIAGNOSIS — J439 Emphysema, unspecified: Secondary | ICD-10-CM | POA: Diagnosis not present

## 2024-05-05 ENCOUNTER — Ambulatory Visit (HOSPITAL_BASED_OUTPATIENT_CLINIC_OR_DEPARTMENT_OTHER)
Admission: RE | Admit: 2024-05-05 | Discharge: 2024-05-05 | Disposition: A | Discharge status: Home / Self Care | Source: Ambulatory Visit | Attending: Acute Care | Admitting: Acute Care

## 2024-05-05 DIAGNOSIS — R911 Solitary pulmonary nodule: Secondary | ICD-10-CM | POA: Diagnosis present

## 2024-05-11 ENCOUNTER — Encounter: Payer: Self-pay | Admitting: Acute Care

## 2024-05-11 ENCOUNTER — Ambulatory Visit: Admitting: Acute Care

## 2024-05-11 VITALS — BP 113/77 | HR 99 | Ht 64.0 in | Wt 160.8 lb

## 2024-05-11 DIAGNOSIS — Z9189 Other specified personal risk factors, not elsewhere classified: Secondary | ICD-10-CM

## 2024-05-11 DIAGNOSIS — Z87891 Personal history of nicotine dependence: Secondary | ICD-10-CM

## 2024-05-11 DIAGNOSIS — Z01811 Encounter for preprocedural respiratory examination: Secondary | ICD-10-CM | POA: Diagnosis not present

## 2024-05-11 DIAGNOSIS — J449 Chronic obstructive pulmonary disease, unspecified: Secondary | ICD-10-CM | POA: Diagnosis not present

## 2024-05-11 DIAGNOSIS — R9389 Abnormal findings on diagnostic imaging of other specified body structures: Secondary | ICD-10-CM

## 2024-05-11 DIAGNOSIS — R911 Solitary pulmonary nodule: Secondary | ICD-10-CM | POA: Diagnosis not present

## 2024-05-11 NOTE — Patient Instructions (Addendum)
 It is good to see you today. We have reviewed your CT Chest. This shows the pulmonary cyst in the left upper lobe is stable, but still concerning for a slow growing lung cancer.  We will do a 6 month follow up scan.  This will be due 10/2024. You will follow up with me after the scan to review results. I have re-referred you to radiation oncology.  You will get a phone call to get this scheduled.  I have done the pre op risk assessment.  You are high risk for pulmonary complications due to your chronic hypoxic respiratory failure . We will clear you for the procedure, but with high risk of pulmonary complications.  See recommendations listed in Office note dated 05/11/2024 with suggestions to minimize risk. Continue Breztri  2 puffs in the morning and 2 puffs in the evening. Rinse mouth after use.  Continue oxygen  at 2 L at rest and 3 L with ambulation. Sats must always be > 88% Please let urology know the Cipro  with the Azithromycin  can cause cardiac issues. ( QTc prolongation) Please do not take these 2 medications together.  Follow up 06/2023 as is scheduled

## 2024-05-11 NOTE — Progress Notes (Signed)
 History of Present Illness Brandy Morales is a 78 y.o. female referred in June 2021 for abnormal PET scan, Dr. Geronimo, PCP: By Cleotilde, [place]  E, PA . She was followed by Dr. Brenna for lung nodule and COPD/ Chronic hypoxic respiratory failure.    Synopsis 78 year old female past medical history of severe COPD, chronic oxygen  dependent respiratory failure on 2 L pulse POC, asthma, hypertension. Patient had CT scan imaging which revealed a right upper lobe pulmonary nodule abutting the major fissure. Patient underwent nuclear medicine pet imaging which revealed a PET avid right upper lobe pulmonary nodule SUV 5.4. Patient was referred for evaluation of navigational bronchoscopy and tissue sampling. Patient felt to be not a good candidate for surgical resection due to poor lung function and functional status. She was set up for bronchoscopy ,however declined due to concern for general anesthesia and ultimately sought radiation oncology in August 2021 and completed 54 Gray, 3 fractions of empiric SBRT to this highly suspicious nodule. Radiation treatment was done 12/29/2019 through 01/05/2020 No tissue diagnosis was obtained.    Most recent surveillance CT chest done on 07-20-23 showed progressive platelike spiculated opacities in the right upper and lower lobes measuring  2.1 x 1.5 cm and 1.8 x 1.0 cm in the anterior right lower lobe, both along the right major fissure. Scan also showed a 4 mm irregular nodule in the right middle lobe along the minor fissure and mild cavitary/ground-glass nodule in the left upper lobe. Scattered hepatic cysts, calcified right upper pole renal lesion, and a 2.7 cm left adrenal adenoma was also noted on exam.  These areas were outside the original radiation field, so recurrence is possible.  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 Lesion is hypometabolic but CT findings are worrisome for low-grade  adenocarcinoma.    Radiation Treatment Dates: 12/29/2019 through 01/05/2020 Site Technique Total Dose (Gy) Dose per Fx (Gy) Completed Fx Beam Energies  Lung, Right: Lung_Rt SBRT 54/54 18 3/3 6XFFF     05/11/2024 Discussed the use of AI scribe software for clinical note transcription with the patient, who gave verbal consent to proceed.  History of Present Illness Patient presents today for CT chest follow-up.  We have been following a pulmonary nodule very closely.  We have reviewed her CT chest today, this shows the pulmonary cyst in the left upper lobe is stable but still concerning for slow-growing lung cancer.  Plan is for 32-month follow-up CT chest.  Patient has not been followed by radiation oncology in about a year.  I have rereferred her to them to have them review her imaging to determine if they feel additional SBRT may be necessary based on CT chest results.  Patient was last seen 04/19/2024.  At that time she had low oxygen  saturations.  We had increased her from 2 L to 3 L nasal cannula at all times.  She states that helped considerably. She experiences improvement in shortness of breath since increasing her oxygen  to three liters. She uses Breztri , which she finds helpful, and has decreased her use of the albuterol  rescue inhaler. She uses three liters of oxygen  continuously at home and maintains her saturation above 88%.  Patient  has kidney stones, with a recent CT scan showing a 7.1 mm and a 1.6 mm stone in the left ureter. She is concerned about risks of potential surgery for kidney stones due to her chronic respiratory failure and the risks involved, such  as infection and bladder damage. She has experienced significant pain from kidney stones in the past and is considering discussing further with her urologist.  She asked me to do a surgical clearance.  She explained that this procedure would be done under general anesthesia.  I have done several assessments for risks of pulmonary  complications in setting of general anesthesia.  Patient remains a very high risk for complications due to her chronic hypoxic respiratory failure.  She will be cleared for procedure however with the caveat that she is in very high risk for pulmonary complications.  Risk assessment scores have been clearly documented in this note for urology to review.  Additionally I have added recommendations prior to surgery.  Patient is currently taking azithromycin  for her chronic respiratory disease.  She was recently prescribed Cipro  floxacillin by her urologist.  I have advised the patient against taking ciprofloxacin  with her azithromycin  due to potential cardiac risks when combined with her current medication regimen.  The combination of these 2 medications can cause QTc prolongation as well as torsades .  Last EKG on August 20 25 showed QTc of 322/442 ms. I have asked the patient to reach out to urology and see if there is a better option that does not interact with her azithromycin  for treatment.  She has no known heart issues and her blood pressure is generally stable, though it was noted to be at low and normal at 113/70 during this visit.     Risk assessment for Pulmonary Complications with Surgery  1) RISK FOR PROLONGED MECHANICAL VENTILAION - > 48h  1A) Arozullah - Prolonged mech ventilation risk Arozullah Postperative Pulmonary Risk Score - for mech ventilation dependence >48h Usaa, Ann Surg 2000, major non-cardiac surgery) Comment Score  Type of surgery - abd ao aneurysm (27), thoracic (21), neurosurgery / upper abdominal / vascular (21), neck (11) Upper anbominal 21  Emergency Surgery - (11) No 0  ALbumin < 3 or poor nutritional state - (9)  No Labs ?  BUN > 30 -  (8) 36 8  Partial or completely dependent functional status - (7)  Independent 0  COPD -  (6)  Severe 6  Age - 60 to 79 (4), > 70  (6) 77  6  TOTAL    Risk Stratifcation scores  - < 10 (0.5%), 11-19 (1.8%), 20-27  (4.2%), 28-40 (10.1%), >40 (26.6%) 26.6% chance need for mechanical ventilation 41      1B) GUPTA - Prolonged Mech Vent Risk Score source Risk  Guptal post op prolonged mech ventilation > 48h or reintubation < 30 days - ACS 2007-2008 dataset - 6.9 % Risk of mechanical ventilation for >48 hrs after surgery, or unplanned intubation <=30 days of surgery 6.9 % Risk of mechanical ventilation for >48 hrs after surgery, or unplanned intubation <=30 days of surgery    2) RISK FOR POST OP PNEUMONIA Score source Risk  Charlanne - Post Op Pnemounia risk  [email or web address] 5.4 % Risk of postoperative pneumonia    R3) ISK FOR ANY POST-OP PULMONARY COMPLICATION Score source Risk  CANET/ARISCAT Score - risk for ANY/ALl pulmonary complications - > risk of in-hospital post-op pulmonary complications (composite including respiratory failure, respiratory infection, pleural effusion, atelectasis, pneumothorax, bronchospasm, aspiration pneumonitis) modelsolar.es - based on age, anemia, pulse ox, resp infection prior 30d, incision site, duration of surgery, and emergency v elective surgery 43 points ARISCAT Score    Pt. Is high risk for respiratory complications with general anesthesia. She has recently  needed to increase her home oxygen  from 2 L to 3 L.  Major Pulmonary risks identified in the multifactorial risk analysis are but not limited to a) pneumonia; b) recurrent intubation risk; c) prolonged or recurrent acute respiratory failure needing mechanical ventilation; d) prolonged hospitalization; e) DVT/Pulmonary embolism; f) Acute Pulmonary edema  Recommend 1. Short duration of surgery as much as possible and avoid paralytic if possible 2. Recovery in step down or ICU with Pulmonary consultation if difficulty extubating. 3. DVT prophylaxis 4. Aggressive pulmonary toilet with o2, bronchodilatation, and  incentive spirometry and early ambulation 5. Make sure patient uses maintenance  inhaler prior to procedure.      She lives independently in [place] and has a social circle that includes regular lunches with friends. She is planning a birthday lunch at [facility] with friends, one of whom is 69 years old.     Test Results: CT Chest  21/03/2024 Left upper lobe atypical pulmonary cyst with lateral ground-glass wall thickening, stable. Low-grade adenocarcinoma cannot be excluded. Recommend follow-up CT chest without contrast in 1 year. 2. Post treatment scarring in the right upper and right lower lobes. 3. Left adrenal adenoma. 4. Small left renal stones. 5. Aortic atherosclerosis (ICD10-I70.0). Coronary artery calcification. 6.  Emphysema (ICD10-J43.9).  PET Scan 07/31/2023 Post treatment scarring in the posterior segment right upper lobe, along the major fissure, without abnormal hypermetabolism, SUV max 2.1. Atypical pulmonary cyst with peripheral ground-glass in the lateral left upper lobe measures 2.0 x 2.2 cm (7/23), SUV max 1.8. Finding is unchanged from 04/01/2023 but new from 09/24/2022. No additional abnormal hypermetabolism.  New left upper lobe atypical pulmonary cyst with peripheral ground-glass. Lesion is hypometabolic but CT findings are worrisome for low-grade adenocarcinoma. Recommend attention on routine lung cancer imaging follow-up or in 6 months, as clinically indicated. 2. Post treatment scarring in the posterior right upper lobe without abnormal hypermetabolism. 3. Left adrenal adenoma. 4. Chronic severe right hydronephrosis secondary to a 7 mm stone in the proximal right ureter. 5. Tiny left renal stones. 6. Aortic atherosclerosis (ICD10-I70.0). Coronary artery calcification. 7.  Emphysema (ICD10-J43.9).      Latest Ref Rng & Units 01/02/2021    5:03 AM 01/01/2021    4:25 AM 12/31/2020    4:15 AM  CBC  WBC 4.0 - 10.5 K/uL 8.5  7.8  5.6    Hemoglobin 12.0 - 15.0 g/dL 87.6  88.2  87.8   Hematocrit 36.0 - 46.0 % 39.5  37.3  38.1   Platelets 150 - 400 K/uL 145  121  111        Latest Ref Rng & Units 01/02/2021    5:03 AM 01/01/2021    4:25 AM 12/31/2020    4:15 AM  BMP  Glucose 70 - 99 mg/dL 896  96  859   BUN 8 - 23 mg/dL 36  46  54   Creatinine 0.44 - 1.00 mg/dL 8.92  8.82  8.46   Sodium 135 - 145 mmol/L 147  144  143   Potassium 3.5 - 5.1 mmol/L 4.3  3.4  3.9   Chloride 98 - 111 mmol/L 111  109  110   CO2 22 - 32 mmol/L 28  24  25    Calcium  8.9 - 10.3 mg/dL 9.3  9.0  9.0     BNP    Component Value Date/Time   BNP 185.2 (H) 01/02/2021 0503    ProBNP No results found for: PROBNP  PFT    Component  Value Date/Time   FEV1PRE 0.63 05/14/2017 1049   FVCPRE 1.50 05/14/2017 1049   TLC 9.98 05/14/2017 1049   DLCOUNC 9.57 05/14/2017 1049   PREFEV1FVCRT 42 05/14/2017 1049    CT CHEST WO CONTRAST Result Date: 05/09/2024 CLINICAL DATA:  Lung nodule.  * Tracking Code: BO * EXAM: CT CHEST WITHOUT CONTRAST TECHNIQUE: Multidetector CT imaging of the chest was performed following the standard protocol without IV contrast. RADIATION DOSE REDUCTION: This exam was performed according to the departmental dose-optimization program which includes automated exposure control, adjustment of the mA and/or kV according to patient size and/or use of iterative reconstruction technique. COMPARISON:  PET 07/31/2023 and CT chest 07/20/2023. FINDINGS: Cardiovascular: Atherosclerotic calcification of the aorta, aortic valve and coronary arteries. Heart size normal. No pericardial effusion. Mediastinum/Nodes: No pathologically enlarged mediastinal or axillary lymph nodes. Hilar regions are difficult to definitively evaluate without IV contrast. Esophagus is grossly unremarkable. Lungs/Pleura: Centrilobular and paraseptal emphysema. Platelike areas of scarring and architectural distortion in the posterior segment right upper lobe and superior  segment right lower lobe, as before. Atypical pulmonary cyst with eccentric ground-glass wall thickening in the lateral left upper lobe measures 2.2 x 2.4 cm (4/62), stable. No pleural fluid. Minimal debris in the airway. Upper Abdomen: Hepatic cysts and left adrenal adenoma. No specific follow-up necessary. Small left renal stones. Visualized portions of the liver, gallbladder, adrenal glands, kidneys, spleen, pancreas, stomach and bowel are otherwise grossly unremarkable. No upper abdominal adenopathy. Musculoskeletal: Osteopenia.  Degenerative changes in the spine. IMPRESSION: 1. Left upper lobe atypical pulmonary cyst with lateral ground-glass wall thickening, stable. Low-grade adenocarcinoma cannot be excluded. Recommend follow-up CT chest without contrast in 1 year. 2. Post treatment scarring in the right upper and right lower lobes. 3. Left adrenal adenoma. 4. Small left renal stones. 5. Aortic atherosclerosis (ICD10-I70.0). Coronary artery calcification. 6.  Emphysema (ICD10-J43.9). Electronically Signed   By: Newell Eke M.D.   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 Her smoking use included cigarettes. She started smoking about 65 years ago. She has a 110 pack-year smoking history. She has been exposed to tobacco smoke. She has never used smokeless tobacco. She reports that she does not currently use alcohol  after a past usage of about 1.0 standard drink of alcohol  per week. She reports that she does not use  drugs.  Tobacco Cessation: Counseling given: Not Answered Former smoker quit 2016 with a 110-pack-year smoking history  Past surgical hx, Family hx, Social hx all reviewed.  Current Outpatient Medications on File Prior to Visit  Medication Sig   acyclovir  (ZOVIRAX ) 400 MG tablet Take 400 mg by mouth daily at 2 PM.   albuterol  (ACCUNEB ) 1.25 MG/3ML nebulizer solution Take 1 ampule by nebulization every 6 (six) hours as needed for wheezing.   albuterol  (VENTOLIN  HFA) 108 (90 Base) MCG/ACT inhaler Inhale 2 puffs into the lungs every 6 (  six) hours as needed for wheezing or shortness of breath.   Artificial Tear Solution (GENTEAL TEARS OP) Place 1 drop into both eyes daily.    atorvastatin  (LIPITOR) 10 MG tablet Take 5 mg by mouth 3 (three) times a week. Take on MWF   BREZTRI  AEROSPHERE 160-9-4.8 MCG/ACT AERO inhaler INHALE 2 PUFFS INTO THE LUNGS TWICE A DAY   cetirizine (ZYRTEC) 10 MG tablet Take 10 mg by mouth daily.   Cholecalciferol (VITAMIN D3) 5000 units CAPS Take 5,000 Units by mouth once a week.   citalopram  (CELEXA ) 20 MG tablet Take 20 mg by mouth daily.   Cyanocobalamin (VITAMIN B-12) 1000 MCG SUBL Take 1,000 mcg by mouth daily.   ezetimibe  (ZETIA ) 10 MG tablet Take 10 mg by mouth at bedtime.   fluticasone  (FLONASE ) 50 MCG/ACT nasal spray Place 1 spray into both nostrils daily as needed for allergies.   levalbuterol  (XOPENEX ) 1.25 MG/3ML nebulizer solution Take 1.25 mg by nebulization every 4 (four) hours as needed for wheezing or shortness of breath.   losartan  (COZAAR ) 25 MG tablet Take 25 mg by mouth daily.   Multiple Vitamin (MULTIVITAMIN) tablet Take 1 tablet by mouth daily.   OXYGEN  Inhale 2 L into the lungs continuous.    Probiotic Product (PRO-BIOTIC BLEND PO) Take 1 tablet by mouth daily. [employer] Probiotic Supplement   No current facility-administered medications on file prior to visit.     Allergies[2]  Review Of Systems:  Constitutional:   No  weight  loss, night sweats,  Fevers, chills,+ fatigue, or  lassitude.  HEENT:   No headaches,  Difficulty swallowing,  Tooth/dental problems, or  Sore throat,                No sneezing, itching, ear ache, nasal congestion, post nasal drip,   CV:  No chest pain,  Orthopnea, PND, swelling in lower extremities, anasarca, dizziness, palpitations, syncope.   GI  No heartburn, indigestion, abdominal pain, nausea, vomiting, diarrhea, change in bowel habits, loss of appetite, bloody stools.   Resp: ++ shortness of breath with exertion less at rest.  + Baseline excess mucus, + baseline productive cough,  ++ baseline non-productive cough,  No coughing up of blood.  No change in color of mucus.  +occasional wheezing.  No chest wall deformity  Skin: no rash or lesions.  GU: no dysuria, change in color of urine, no urgency or frequency.  No flank pain, no hematuria   MS:  No joint pain or swelling.  + decreased range of motion.  No back pain.  Psych:  No change in mood or affect. No depression or anxiety.  No memory loss.   Vital Signs BP 113/77   Pulse 99   Ht 5' 4 (1.626 m)   Wt 160 lb 12.8 oz (72.9 kg)   SpO2 94%   BMI 27.60 kg/m   Physical Exam VITALS: P- 99, BP- 113/, SaO2- 94% GENERAL: No distress, alert and oriented times 3. EARS NOSE THROAT: No sinus tenderness, tympanic membranes clear, pale nasal mucosa, no oral exudate, no post nasal drip, no lymphadenopathy. CHEST: Lungs clear to auscultation bilaterally. + wheeze, No rales, dullness, no accessory muscle use, no nasal flaring, no sternal retractions. CARDIAC: S1, S2, regular rate and rhythm, no murmur. ABDOMINAL: Soft, non tender. ND, BS present,Body mass index is 27.6 kg/m.  EXTREMITIES: No clubbing, cyanosis, edema. No obvious deformities NEUROLOGICAL: Physical deconditioning, alert and oriented x 3, MAE x 4 SKIN: No rashes, warm and  dry. No obvious skin lesions PSYCHIATRIC: Normal mood and behavior.    Assessment/Plan  Assessment and Plan Assessment & Plan Left upper lobe pulmonary nodule (solitary pulmonary nodule) Stable atypical pulmonary cyst.  Remains concerning for bronchogenic carcinoma Low-grade adenocarcinoma cannot be excluded.  Previous PET scan positive. Last radiation treatment in August 2021.  Comfortable with six-month CT chest follow-up. - Ordered CT chest in six months - We referred patient to radiation oncology for evaluation of current imaging to determine if they feel additional treatment is needed.  Severe chronic obstructive pulmonary disease with hypoxemia COPD with hypoxemia well-managed with Breztri .  Improved shortness of breath and reduced albuterol  use since oxygen  increased to 3 L.  Oxygen  saturation 94% at rest. Uses 2 liters oxygen  at rest, 3 liters with ambulation. - Continue Breztri . - Maintain oxygen  therapy at 2 liters at rest and 3 liters with ambulation. - Follow-up in 3 months to ensure you are doing well in regard to your COPD  Preoperative pulmonary risk assessment for urologic surgery High risk for pulmonary complications due to COPD, and chronic hypoxic respiratory failure. Cleared for surgery , but high risk for pulmonary complications . Risk mitigation recommendations.  Concerns about ciprofloxacin  and azithromycin  combination due to cardiac side effects.  Specifically prolonged QTc and torsades - Cleared for surgery with caveat that patient is high risk for pulmonary complications. -See specific surgical risk assessment documentation above in the note - Do not combine ciprofloxacin  and azithromycin . - Ensure use of maintenance inhaler prior to surgery. - Implement DVT prophylaxis. - Perform aggressive pulmonary toilet  - If difficulty with extubation please consult pulmonary for evaluation - Referred to radiation oncology for follow-up.  Visit summary 05/11/2024 We have reviewed your CT Chest. This shows the pulmonary cyst in the  left upper lobe is stable, but still concerning for a slow growing lung cancer.  We will do a 6 month follow up scan.  This will be due 10/2024. You will follow up with me after the scan to review results. I have re-referred you to radiation oncology.  You will get a phone call to get this scheduled.  I have done the pre op risk assessment.  You are high risk for pulmonary complications due to your chronic hypoxic respiratory failure . Please follow-up in 3 months for evaluation of COPD. We will clear you for the procedure, but with high risk of pulmonary complications.  See recommendations listed in Office note dated 05/11/2024 with suggestions to minimize risk. Continue Breztri  2 puffs in the morning and 2 puffs in the evening. Rinse mouth after use.  Continue oxygen  at 2 L at rest and 3 L with ambulation. Sats must always be > 88% Please let urology know the Cipro  with the Azithromycin  can cause cardiac issues. ( QTc prolongation) Please do not take these 2 medications together.  Follow up 06/2023 as is scheduled   I spent 45 minutes dedicated to the care of this patient on the date of this encounter to include pre-visit review of records, face-to-face time with the patient discussing conditions above, post visit ordering of testing, clinical documentation with the electronic health record, making appropriate referrals as documented, and communicating necessary information to the patient's healthcare team.     Lauraine JULIANNA Lites, NP 05/11/2024  11:10 AM             [1]  Social History Tobacco Use   Smoking status: Former    Current packs/day: 0.00    Average packs/day:  2.0 packs/day for 55.0 years (110.0 ttl pk-yrs)    Types: Cigarettes    Start date: 58    Quit date: 2016    Years since quitting: 9.9    Passive exposure: Past   Smokeless tobacco: Never  Vaping Use   Vaping status: Never Used  Substance Use Topics   Alcohol  use: Not Currently    Alcohol /week: 1.0  standard drink of alcohol     Types: 1 Glasses of wine per week    Comment: rarely   Drug use: Never  [2]  Allergies Allergen Reactions   Statins Cough   Latex Other (See Comments)    Broke out on face   Morphine  And Codeine Other (See Comments)    Describes: feel like I lose control of my body.   Oxycodone  Other (See Comments)    Describes: feels like I lose control of my body

## 2024-05-24 NOTE — Progress Notes (Signed)
 " Radiation Oncology         [phone] ________________________________  Initial Outpatient Re-Consultation  Name: Brandy Morales MRN: [id number]  Date: 05/25/2024  DOB: 28-Aug-1945  RR:Tzoanmw, Bernardino, DO  Byrum, Lamar RAMAN, MD   REFERRING PHYSICIAN: Shelah Lamar RAMAN, MD  DIAGNOSIS: There were no encounter diagnoses.  PET avid right upper lobe spiculated pulmonary nodule concerning for primary bronchogenic carcinoma; s/p SBRT   HISTORY OF PRESENT ILLNESS::Brandy Morales is a 78 y.o. female who is accompanied by ***. she is seen as a courtesy of Dr. Byrum for an opinion concerning radiation therapy as part of management for her recurrent lung nodules. Patient is known to me for her history of radiation for her right lung nodules and was last seen in office on 08/13/23 for a follow up visit with PA Wyatt. Patient is followed by Dr. Shelah and undergoes surveillance CT scans. Patient is a f   She underwent a surveillance CT chest done on 07-20-23 showed progressive platelike spiculated opacities in the right upper and lower lobes measuring  2.1 x 1.5 cm and 1.8 x 1.0 cm in the anterior right lower lobe, both along the right major fissure. Scan also showed a 4 mm irregular nodule in the right middle lobe along the minor fissure and mild cavitary/ground-glass nodule in the left upper lobe. Scattered hepatic cysts, calcified right upper pole renal lesion, and a 2.7 cm left adrenal adenoma was also noted on exam.  These areas were outside the original radiation field, so recurrence is possible.   Subsequently, a PET scan was done 07/31/2023 which showed New left upper lobe atypical pulmonary cyst with peripheral ground-glass. Lesion is hypometabolic but CT findings are worrisome for low-grade adenocarcinoma.  Most recent CT chest performed on 05/09/24 indicated Atypical pulmonary cyst with eccentric ground-glass wall thickening in the lateral left upper lobe measures 2.2 x 2.4 cm.    Of note: patient is  diagnosed with kidney stones as well and plans to undergo a surgical intervention for. She requested surgical clearance during her most recent visit with NP Groce on 05/11/24.    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   On home O2    2 L [place] continuous    PAST SURGICAL HISTORY: Past Surgical History:  Procedure Laterality Date   ABDOMINAL HYSTERECTOMY  age 15   partial 1 ovary left   APPENDECTOMY     colonscopy     CYSTOSCOPY W/ URETERAL STENT PLACEMENT Left 12/30/2020   Procedure: CYSTOSCOPY WITH RETROGRADE PYELOGRAM/URETERAL STENT PLACEMENT;  Surgeon: Sherrilee Belvie CROME, MD;  Location: [facility];  Service: Urology;  Laterality: Left;   CYSTOSCOPY WITH RETROGRADE PYELOGRAM, URETEROSCOPY AND STENT PLACEMENT N/A 11/30/2017   Procedure: CYSTOSCOPY WITH BILATERAL RETROGRADE PYELOGRAM, LEFT URETEROSCOPY/ LEFT LASER LITHO AND LEFT STENT PLACEMENT;  Surgeon: Carolee Sherwood JONETTA DOUGLAS, MD;  Location: [facility];  Service: Urology;  Laterality: N/A;   CYSTOSCOPY WITH RETROGRADE PYELOGRAM, URETEROSCOPY AND STENT PLACEMENT Left 02/01/2018   Procedure: CYSTOSCOPY WITH LEFT RETROGRADE PYELOGRAM, URETEROSCOPY HOLMIUM LASER AND STENT PLACEMENT;  Surgeon: Carolee Sherwood JONETTA DOUGLAS, MD;  Location: [facility];  Service: Urology;  Laterality: Left;   EXTRACORPOREAL SHOCK WAVE LITHOTRIPSY Left 02/07/2021   Procedure: LEFT EXTRACORPOREAL SHOCK WAVE LITHOTRIPSY (ESWL);  Surgeon: Carolee Sherwood JONETTA DOUGLAS, MD;  Location: [facility];  Service: Urology;  Laterality: Left;  90 MINS FOR THIS CASE NEED CRNA ON TRUCK  PLEASE CLOSE ROOM 2 AT 11:15 FOR  THIS CASE   EXTRACORPOREAL SHOCK WAVE LITHOTRIPSY Right 10/24/2022   Procedure: RIGHT EXTRACORPOREAL SHOCK WAVE LITHOTRIPSY (ESWL);  Surgeon: Alvaro Ricardo KATHEE Mickey., MD;  Location: [facility];  Service: Urology;  Laterality: Right;   EYE SURGERY Bilateral    Cataract   TONSILLECTOMY      FAMILY HISTORY: No family history on file.  SOCIAL HISTORY: Social History[1]  ALLERGIES: Allergies[2]  MEDICATIONS:  Current Outpatient Medications  Medication Sig Dispense Refill   acyclovir  (ZOVIRAX ) 400 MG tablet Take 400 mg by mouth daily at 2 PM.     albuterol  (ACCUNEB ) 1.25 MG/3ML nebulizer solution Take 1 ampule by nebulization every 6 (six) hours as needed for wheezing.     albuterol  (VENTOLIN  HFA) 108 (90 Base) MCG/ACT inhaler Inhale 2 puffs into the lungs every 6 (six) hours as needed for wheezing or shortness of breath.     Artificial Tear Solution (GENTEAL TEARS OP) Place 1 drop into both eyes daily.      atorvastatin  (LIPITOR) 10 MG tablet Take 5 mg by mouth 3 (three) times a week. Take on MWF     BREZTRI  AEROSPHERE 160-9-4.8 MCG/ACT AERO inhaler INHALE 2 PUFFS INTO THE LUNGS TWICE A DAY 10.7 each 4   cetirizine (ZYRTEC) 10 MG tablet Take 10 mg by mouth daily.     Cholecalciferol (VITAMIN D3) 5000 units CAPS Take 5,000 Units by mouth once a week.     citalopram  (CELEXA ) 20 MG tablet Take 20 mg by mouth daily.     Cyanocobalamin (VITAMIN B-12) 1000 MCG SUBL Take 1,000 mcg by mouth daily.     ezetimibe  (ZETIA ) 10 MG tablet Take 10 mg by mouth at bedtime.     fluticasone  (FLONASE ) 50 MCG/ACT nasal spray Place 1 spray into both nostrils daily as needed for allergies.     levalbuterol  (XOPENEX ) 1.25 MG/3ML nebulizer solution Take 1.25 mg by nebulization every 4 (four) hours as needed for wheezing or shortness  of breath. 120 mL 11   losartan  (COZAAR ) 25 MG tablet Take 25 mg by mouth daily.     Multiple Vitamin (MULTIVITAMIN) tablet Take 1 tablet by mouth daily.     OXYGEN  Inhale 2 L into the lungs continuous.      Probiotic Product (PRO-BIOTIC BLEND PO) Take 1 tablet by mouth daily. [employer] Probiotic Supplement     No current facility-administered medications for this visit.    REVIEW OF SYSTEMS:  A 10+ POINT REVIEW OF SYSTEMS WAS OBTAINED including neurology, dermatology, psychiatry, cardiac, respiratory, lymph, extremities, GI, GU, musculoskeletal, constitutional, reproductive, HEENT. ***   PHYSICAL EXAM:  vitals were not taken for this visit.   General: Alert and oriented, in no acute distress HEENT: Head is normocephalic. Extraocular movements are intact. Oropharynx is clear. Neck: Neck is supple, no palpable cervical or supraclavicular lymphadenopathy. Heart: Regular in rate and rhythm with no murmurs, rubs, or gallops.  Chest: Clear to auscultation bilaterally, with no rhonchi, wheezes, or rales. Abdomen: Soft, nontender, nondistended, with no rigidity or guarding. Extremities: No cyanosis or edema. Lymphatics: see Neck Exam Skin: No concerning lesions. Musculoskeletal: symmetric strength and muscle tone throughout. Neurologic: Cranial nerves II through XII are grossly intact. No obvious focalities. Speech is fluent. Coordination is intact. Psychiatric: Judgment and insight are intact. Affect is appropriate. ***  ECOG = ***  0 - Asymptomatic (Fully active, able to carry on all predisease activities without restriction)  1 - Symptomatic but completely ambulatory (Restricted in physically strenuous activity but ambulatory and able to carry out work of a light or sedentary nature. For example, light housework, office work)  2 - Symptomatic, <50% in bed during the day (Ambulatory and capable of all self care but unable to carry out any work activities. Up and about more than  50% of waking hours)  3 - Symptomatic, >50% in bed, but not bedbound (Capable of only limited self-care, confined to bed or chair 50% or more of waking hours)  4 - Bedbound (Completely disabled. Cannot carry on any self-care. Totally confined to bed or chair)  5 - Death   Raylene MM, Creech RH, Tormey DC, et al. [phone]). Toxicity and response criteria of the [facility]. Am. DOROTHA Bridges. Oncol. 5 (6): 649-55  LABORATORY DATA:  Lab Results  Component Value Date   WBC 8.5 01/02/2021   HGB 12.3 01/02/2021   HCT 39.5 01/02/2021   MCV 91.4 01/02/2021   PLT 145 (L) 01/02/2021   NEUTROABS 7.0 12/29/2020   Lab Results  Component Value Date   NA 147 (H) 01/02/2021   K 4.3 01/02/2021   CL 111 01/02/2021   CO2 28 01/02/2021   GLUCOSE 103 (H) 01/02/2021   BUN 36 (H) 01/02/2021   CREATININE 1.07 (H) 01/02/2021   CALCIUM  9.3 01/02/2021      RADIOGRAPHY: CT CHEST WO CONTRAST Result Date: 05/09/2024 CLINICAL DATA:  Lung nodule.  * Tracking Code: BO * EXAM: CT CHEST WITHOUT CONTRAST TECHNIQUE: Multidetector CT imaging of the chest was performed following the standard protocol without IV contrast. RADIATION DOSE REDUCTION: This exam was performed according to the departmental dose-optimization program which includes automated exposure control, adjustment of the mA and/or kV according to patient size and/or use of iterative reconstruction technique. COMPARISON:  PET 07/31/2023 and CT chest 07/20/2023. FINDINGS: Cardiovascular: Atherosclerotic calcification of the aorta, aortic valve and coronary arteries. Heart size normal. No pericardial effusion. Mediastinum/Nodes: No pathologically enlarged mediastinal or axillary lymph nodes. Hilar regions are difficult to definitively evaluate without IV contrast. Esophagus is grossly unremarkable. Lungs/Pleura: Centrilobular and paraseptal emphysema. Platelike areas of scarring and architectural distortion in the posterior segment right upper  lobe and superior segment right lower lobe, as before. Atypical pulmonary cyst with eccentric ground-glass wall thickening in the lateral left upper lobe measures 2.2 x 2.4 cm (4/62), stable. No pleural fluid. Minimal debris in the airway. Upper Abdomen: Hepatic cysts and left adrenal adenoma. No specific follow-up necessary. Small left renal stones. Visualized portions of the liver, gallbladder, adrenal glands, kidneys, spleen, pancreas, stomach and bowel are otherwise grossly unremarkable. No upper abdominal adenopathy. Musculoskeletal: Osteopenia.  Degenerative changes in the spine. IMPRESSION: 1. Left upper lobe atypical pulmonary cyst with lateral ground-glass wall thickening, stable. Low-grade adenocarcinoma cannot be excluded. Recommend follow-up CT chest without contrast in 1 year. 2. Post treatment scarring in the right upper and right lower lobes. 3. Left adrenal adenoma. 4. Small left  renal stones. 5. Aortic atherosclerosis (ICD10-I70.0). Coronary artery calcification. 6.  Emphysema (ICD10-J43.9). Electronically Signed   By: Newell Eke M.D.   On: 05/09/2024 13:51      IMPRESSION: PET avid right upper lobe spiculated pulmonary nodule concerning for primary bronchogenic carcinoma; s/p SBRT   ***  Today, I talked to the patient and family about the findings and work-up thus far.  We discussed the natural history of *** and general treatment, highlighting the role of radiotherapy in the management.  We discussed the available radiation techniques, and focused on the details of logistics and delivery.  We reviewed the anticipated acute and late sequelae associated with radiation in this setting.  The patient was encouraged to ask questions that I answered to the best of my ability. *** A patient consent form was discussed and signed.  We retained a copy for our records.  The patient would like to proceed with radiation and will be scheduled for CT simulation.  PLAN: ***    *** minutes of total  time was spent for this patient encounter, including preparation, face-to-face counseling with the patient and coordination of care, physical exam, and documentation of the encounter.   ------------------------------------------------  Lynwood CHARM Nasuti, PhD, MD  This document serves as a record of services personally performed by Lynwood Nasuti, MD. It was created on his behalf by Reymundo Cartwright, a trained medical scribe. The creation of this record is based on the scribe's personal observations and the provider's statements to them. This document has been checked and approved by the attending provider.      [1]  Social History Tobacco Use   Smoking status: Former    Current packs/day: 0.00    Average packs/day: 2.0 packs/day for 55.0 years (110.0 ttl pk-yrs)    Types: Cigarettes    Start date: 61    Quit date: 2016    Years since quitting: 10.0    Passive exposure: Past   Smokeless tobacco: Never  Vaping Use   Vaping status: Never Used  Substance Use Topics   Alcohol  use: Not Currently    Alcohol /week: 1.0 standard drink of alcohol     Types: 1 Glasses of wine per week    Comment: rarely   Drug use: Never  [2]  Allergies Allergen Reactions   Statins Cough   Latex Other (See Comments)    Broke out on face   Morphine  And Codeine Other (See Comments)    Describes: feel like I lose control of my body.   Oxycodone  Other (See Comments)    Describes: feels like I lose control of my body   "

## 2024-05-24 NOTE — Progress Notes (Signed)
 Location of tumor and Histology per Pathology Report:  Right lung   Biopsy:  None Past/Anticipated interventions by surgeon, if any: {[id number]:[id number]} ***  Past/Anticipated interventions by medical oncology/pulmonology, if any:     Pain issues, if any:  {:18581} {PAIN DESCRIPTION:21022940}  SAFETY ISSUES: Prior radiation? {:18581} Pacemaker/ICD? {:18581} Possible current pregnancy? no Is the patient on methotrexate? {:18581}  Current Complaints / other details:  ***     ***

## 2024-05-25 ENCOUNTER — Ambulatory Visit
Admission: RE | Admit: 2024-05-25 | Discharge: 2024-05-25 | Disposition: A | Discharge status: Home / Self Care | Source: Ambulatory Visit | Attending: Radiation Oncology | Admitting: Radiation Oncology

## 2024-05-25 ENCOUNTER — Encounter: Payer: Self-pay | Admitting: Radiation Oncology

## 2024-05-25 VITALS — BP 112/71 | HR 98 | Temp 97.4°F | Resp 24 | Ht 64.0 in | Wt 161.6 lb

## 2024-05-25 DIAGNOSIS — M479 Spondylosis, unspecified: Secondary | ICD-10-CM | POA: Diagnosis not present

## 2024-05-25 DIAGNOSIS — D3501 Benign neoplasm of right adrenal gland: Secondary | ICD-10-CM | POA: Diagnosis not present

## 2024-05-25 DIAGNOSIS — J449 Chronic obstructive pulmonary disease, unspecified: Secondary | ICD-10-CM | POA: Insufficient documentation

## 2024-05-25 DIAGNOSIS — I10 Essential (primary) hypertension: Secondary | ICD-10-CM | POA: Diagnosis not present

## 2024-05-25 DIAGNOSIS — Z79624 Long term (current) use of inhibitors of nucleotide synthesis: Secondary | ICD-10-CM | POA: Diagnosis not present

## 2024-05-25 DIAGNOSIS — I2510 Atherosclerotic heart disease of native coronary artery without angina pectoris: Secondary | ICD-10-CM | POA: Diagnosis not present

## 2024-05-25 DIAGNOSIS — Z9049 Acquired absence of other specified parts of digestive tract: Secondary | ICD-10-CM | POA: Insufficient documentation

## 2024-05-25 DIAGNOSIS — Z87891 Personal history of nicotine dependence: Secondary | ICD-10-CM | POA: Insufficient documentation

## 2024-05-25 DIAGNOSIS — I700 Atherosclerosis of aorta: Secondary | ICD-10-CM | POA: Diagnosis not present

## 2024-05-25 DIAGNOSIS — Z9981 Dependence on supplemental oxygen: Secondary | ICD-10-CM | POA: Insufficient documentation

## 2024-05-25 DIAGNOSIS — J438 Other emphysema: Secondary | ICD-10-CM | POA: Diagnosis not present

## 2024-05-25 DIAGNOSIS — Z923 Personal history of irradiation: Secondary | ICD-10-CM | POA: Diagnosis not present

## 2024-05-25 DIAGNOSIS — Z79899 Other long term (current) drug therapy: Secondary | ICD-10-CM | POA: Diagnosis not present

## 2024-05-25 DIAGNOSIS — J432 Centrilobular emphysema: Secondary | ICD-10-CM | POA: Diagnosis not present

## 2024-05-25 DIAGNOSIS — N289 Disorder of kidney and ureter, unspecified: Secondary | ICD-10-CM | POA: Diagnosis not present

## 2024-05-25 DIAGNOSIS — K219 Gastro-esophageal reflux disease without esophagitis: Secondary | ICD-10-CM | POA: Insufficient documentation

## 2024-05-25 DIAGNOSIS — M8580 Other specified disorders of bone density and structure, unspecified site: Secondary | ICD-10-CM | POA: Diagnosis not present

## 2024-05-25 DIAGNOSIS — N200 Calculus of kidney: Secondary | ICD-10-CM | POA: Diagnosis not present

## 2024-05-25 DIAGNOSIS — R918 Other nonspecific abnormal finding of lung field: Secondary | ICD-10-CM

## 2024-05-25 DIAGNOSIS — D3502 Benign neoplasm of left adrenal gland: Secondary | ICD-10-CM | POA: Diagnosis not present

## 2024-05-25 DIAGNOSIS — K7689 Other specified diseases of liver: Secondary | ICD-10-CM | POA: Insufficient documentation

## 2024-05-30 ENCOUNTER — Other Ambulatory Visit: Payer: Self-pay

## 2024-05-30 DIAGNOSIS — Z5181 Encounter for therapeutic drug level monitoring: Secondary | ICD-10-CM

## 2024-06-29 ENCOUNTER — Telehealth: Payer: Self-pay | Admitting: *Deleted

## 2024-06-29 NOTE — Telephone Encounter (Signed)
 CALLED PATIENT TO INFORM OF CT FOR 11-22-24- ARRIVAL TIME- 12:45 PM @ [facility], NO RESTRICTIONS TO SCAN, PATIENT TO RECEIVE RESULTS FROM DR. KINARD ON 11-28-24 @ 11AM, LVM FOR A RETURN CALL

## 2024-07-20 ENCOUNTER — Ambulatory Visit: Admitting: Acute Care

## 2024-10-31 ENCOUNTER — Ambulatory Visit: Admitting: Radiation Oncology

## 2024-11-22 ENCOUNTER — Other Ambulatory Visit (HOSPITAL_COMMUNITY)

## 2024-11-28 ENCOUNTER — Ambulatory Visit: Admitting: Radiation Oncology
